# Patient Record
Sex: Male | Born: 1935 | Race: White | Hispanic: No | Marital: Married | Smoking: Former smoker
Health system: Southern US, Community
[De-identification: ages and names within clinical notes are randomized; demographics above are authoritative.]

## PROBLEM LIST (undated history)

## (undated) DIAGNOSIS — I639 Cerebral infarction, unspecified: Secondary | ICD-10-CM

## (undated) DIAGNOSIS — J349 Unspecified disorder of nose and nasal sinuses: Secondary | ICD-10-CM

## (undated) DIAGNOSIS — M1990 Unspecified osteoarthritis, unspecified site: Secondary | ICD-10-CM

## (undated) DIAGNOSIS — N320 Bladder-neck obstruction: Secondary | ICD-10-CM

## (undated) DIAGNOSIS — I358 Other nonrheumatic aortic valve disorders: Secondary | ICD-10-CM

## (undated) DIAGNOSIS — B029 Zoster without complications: Secondary | ICD-10-CM

## (undated) DIAGNOSIS — K219 Gastro-esophageal reflux disease without esophagitis: Secondary | ICD-10-CM

## (undated) DIAGNOSIS — G629 Polyneuropathy, unspecified: Secondary | ICD-10-CM

## (undated) DIAGNOSIS — D649 Anemia, unspecified: Secondary | ICD-10-CM

## (undated) DIAGNOSIS — R351 Nocturia: Secondary | ICD-10-CM

## (undated) DIAGNOSIS — R7303 Prediabetes: Secondary | ICD-10-CM

## (undated) DIAGNOSIS — E785 Hyperlipidemia, unspecified: Secondary | ICD-10-CM

## (undated) DIAGNOSIS — I10 Essential (primary) hypertension: Secondary | ICD-10-CM

## (undated) DIAGNOSIS — N529 Male erectile dysfunction, unspecified: Secondary | ICD-10-CM

## (undated) DIAGNOSIS — R32 Unspecified urinary incontinence: Secondary | ICD-10-CM

## (undated) DIAGNOSIS — M810 Age-related osteoporosis without current pathological fracture: Secondary | ICD-10-CM

## (undated) DIAGNOSIS — Z87442 Personal history of urinary calculi: Secondary | ICD-10-CM

## (undated) DIAGNOSIS — C61 Malignant neoplasm of prostate: Secondary | ICD-10-CM

## (undated) DIAGNOSIS — I340 Nonrheumatic mitral (valve) insufficiency: Secondary | ICD-10-CM

## (undated) DIAGNOSIS — K259 Gastric ulcer, unspecified as acute or chronic, without hemorrhage or perforation: Secondary | ICD-10-CM

## (undated) DIAGNOSIS — H9190 Unspecified hearing loss, unspecified ear: Secondary | ICD-10-CM

## (undated) DIAGNOSIS — R011 Cardiac murmur, unspecified: Secondary | ICD-10-CM

## (undated) DIAGNOSIS — R350 Frequency of micturition: Secondary | ICD-10-CM

## (undated) DIAGNOSIS — R339 Retention of urine, unspecified: Secondary | ICD-10-CM

## (undated) HISTORY — DX: Frequency of micturition: R35.0

## (undated) HISTORY — PX: CATARACT EXTRACTION: SUR2

## (undated) HISTORY — DX: Cerebral infarction, unspecified: I63.9

## (undated) HISTORY — PX: PROSTATE SURGERY: SHX751

## (undated) HISTORY — DX: Personal history of urinary calculi: Z87.442

## (undated) HISTORY — DX: Age-related osteoporosis without current pathological fracture: M81.0

## (undated) HISTORY — DX: Essential (primary) hypertension: I10

## (undated) HISTORY — DX: Gastro-esophageal reflux disease without esophagitis: K21.9

## (undated) HISTORY — DX: Hyperlipidemia, unspecified: E78.5

## (undated) HISTORY — DX: Unspecified disorder of nose and nasal sinuses: J34.9

## (undated) HISTORY — DX: Unspecified urinary incontinence: R32

## (undated) HISTORY — DX: Male erectile dysfunction, unspecified: N52.9

## (undated) HISTORY — DX: Zoster without complications: B02.9

## (undated) HISTORY — DX: Bladder-neck obstruction: N32.0

## (undated) HISTORY — DX: Gastric ulcer, unspecified as acute or chronic, without hemorrhage or perforation: K25.9

## (undated) HISTORY — DX: Retention of urine, unspecified: R33.9

## (undated) HISTORY — PX: TONSILLECTOMY: SUR1361

## (undated) HISTORY — DX: Malignant neoplasm of prostate: C61

## (undated) HISTORY — DX: Unspecified osteoarthritis, unspecified site: M19.90

## (undated) HISTORY — PX: UMBILICAL HERNIA REPAIR: SHX196

## (undated) HISTORY — DX: Nocturia: R35.1

## (undated) HISTORY — DX: Unspecified hearing loss, unspecified ear: H91.90

---

## 1970-07-08 HISTORY — PX: CHOLECYSTECTOMY: SHX55

## 2005-01-14 ENCOUNTER — Ambulatory Visit: Payer: Self-pay | Admitting: Unknown Physician Specialty

## 2007-06-11 ENCOUNTER — Ambulatory Visit: Payer: Self-pay | Admitting: Urology

## 2007-06-12 ENCOUNTER — Ambulatory Visit: Payer: Self-pay | Admitting: Urology

## 2007-06-19 ENCOUNTER — Ambulatory Visit: Payer: Self-pay | Admitting: Urology

## 2007-07-21 ENCOUNTER — Ambulatory Visit: Payer: Self-pay | Admitting: Urology

## 2007-07-21 ENCOUNTER — Other Ambulatory Visit: Payer: Self-pay

## 2007-07-28 ENCOUNTER — Ambulatory Visit: Payer: Self-pay | Admitting: Urology

## 2010-07-08 DIAGNOSIS — I639 Cerebral infarction, unspecified: Secondary | ICD-10-CM

## 2010-07-08 HISTORY — DX: Cerebral infarction, unspecified: I63.9

## 2010-11-19 ENCOUNTER — Inpatient Hospital Stay: Payer: Self-pay | Admitting: *Deleted

## 2010-11-23 DIAGNOSIS — I6990 Unspecified sequelae of unspecified cerebrovascular disease: Secondary | ICD-10-CM

## 2010-11-23 DIAGNOSIS — I6789 Other cerebrovascular disease: Secondary | ICD-10-CM

## 2010-11-23 DIAGNOSIS — I10 Essential (primary) hypertension: Secondary | ICD-10-CM

## 2010-11-26 ENCOUNTER — Telehealth: Payer: Self-pay | Admitting: *Deleted

## 2010-11-26 NOTE — Telephone Encounter (Signed)
Note is on my desk.  

## 2010-11-26 NOTE — Telephone Encounter (Signed)
Wife says that patient recently had a stoke, and they were supposed to fly out today to visit his brother. Wife is asking if you can write a note stating that due to to paint's illness they are not going to be able to fly out so that she can get reimbursed for her tickets. Please call when ready.

## 2010-11-26 NOTE — Telephone Encounter (Signed)
Patients daughter called requesting that letter be faxed to her at [phone].  Letter faxed per her request.

## 2010-11-26 NOTE — Telephone Encounter (Signed)
Left message on machine at home, letter left at front desk for pick up.

## 2010-11-27 ENCOUNTER — Telehealth: Payer: Self-pay | Admitting: *Deleted

## 2010-11-27 NOTE — Telephone Encounter (Signed)
You wrote a letter for the patient to be reimbursed for airline tickets, he has had a stroke and is unable to fly.  Pt's daughter is asking that you write another letter stating that in your professional opinion patient cannot fly.  She is also asking that we fax a copy of neurologist's report to her at fax number [phone].

## 2010-11-27 NOTE — Telephone Encounter (Signed)
Spoke with Darl Pikes and advised her as instructed.  She will call Dondra Spry to get neurology notes.  Re-written note faxed to Darl Pikes at [phone].

## 2010-11-27 NOTE — Telephone Encounter (Signed)
Letter written

## 2010-11-27 NOTE — Telephone Encounter (Signed)
In terms of neurology report, that would be in his records at [facility]. Please call [facility] and ask to speak to his nurse, Dondra Spry. She can fax [facility] notes to the number below.

## 2010-12-18 DIAGNOSIS — I10 Essential (primary) hypertension: Secondary | ICD-10-CM

## 2010-12-18 DIAGNOSIS — I6990 Unspecified sequelae of unspecified cerebrovascular disease: Secondary | ICD-10-CM

## 2010-12-18 DIAGNOSIS — E785 Hyperlipidemia, unspecified: Secondary | ICD-10-CM

## 2011-01-15 DIAGNOSIS — I6990 Unspecified sequelae of unspecified cerebrovascular disease: Secondary | ICD-10-CM

## 2011-01-15 DIAGNOSIS — E785 Hyperlipidemia, unspecified: Secondary | ICD-10-CM

## 2011-01-15 DIAGNOSIS — I10 Essential (primary) hypertension: Secondary | ICD-10-CM

## 2011-01-28 ENCOUNTER — Ambulatory Visit: Payer: Self-pay | Admitting: Family Medicine

## 2011-02-17 ENCOUNTER — Other Ambulatory Visit: Payer: Self-pay | Admitting: Internal Medicine

## 2011-02-18 NOTE — Telephone Encounter (Signed)
I don't think he has established care here.  I saw him at [facility].  His current PCP needs to refill these medications if he is no longer at [facility].

## 2011-09-23 DIAGNOSIS — R7309 Other abnormal glucose: Secondary | ICD-10-CM | POA: Diagnosis not present

## 2011-09-23 DIAGNOSIS — I10 Essential (primary) hypertension: Secondary | ICD-10-CM | POA: Diagnosis not present

## 2011-09-23 DIAGNOSIS — E7800 Pure hypercholesterolemia, unspecified: Secondary | ICD-10-CM | POA: Diagnosis not present

## 2011-09-25 DIAGNOSIS — R209 Unspecified disturbances of skin sensation: Secondary | ICD-10-CM | POA: Diagnosis not present

## 2011-09-25 DIAGNOSIS — R7309 Other abnormal glucose: Secondary | ICD-10-CM | POA: Diagnosis not present

## 2011-09-25 DIAGNOSIS — R5381 Other malaise: Secondary | ICD-10-CM | POA: Diagnosis not present

## 2011-09-25 DIAGNOSIS — R252 Cramp and spasm: Secondary | ICD-10-CM | POA: Diagnosis not present

## 2011-09-25 DIAGNOSIS — E7800 Pure hypercholesterolemia, unspecified: Secondary | ICD-10-CM | POA: Diagnosis not present

## 2011-10-03 ENCOUNTER — Other Ambulatory Visit: Payer: Medicare Other

## 2011-10-10 ENCOUNTER — Ambulatory Visit: Payer: Medicare Other | Admitting: Internal Medicine

## 2011-10-21 DIAGNOSIS — E7800 Pure hypercholesterolemia, unspecified: Secondary | ICD-10-CM | POA: Diagnosis not present

## 2011-10-21 DIAGNOSIS — B351 Tinea unguium: Secondary | ICD-10-CM | POA: Diagnosis not present

## 2011-10-21 DIAGNOSIS — I10 Essential (primary) hypertension: Secondary | ICD-10-CM | POA: Diagnosis not present

## 2011-10-21 DIAGNOSIS — R5381 Other malaise: Secondary | ICD-10-CM | POA: Diagnosis not present

## 2011-10-28 ENCOUNTER — Other Ambulatory Visit: Payer: Medicare Other

## 2011-10-28 DIAGNOSIS — B351 Tinea unguium: Secondary | ICD-10-CM | POA: Diagnosis not present

## 2011-10-28 DIAGNOSIS — M79609 Pain in unspecified limb: Secondary | ICD-10-CM | POA: Diagnosis not present

## 2011-10-28 DIAGNOSIS — Q828 Other specified congenital malformations of skin: Secondary | ICD-10-CM | POA: Diagnosis not present

## 2011-10-28 DIAGNOSIS — M2040 Other hammer toe(s) (acquired), unspecified foot: Secondary | ICD-10-CM | POA: Diagnosis not present

## 2011-11-28 DIAGNOSIS — B351 Tinea unguium: Secondary | ICD-10-CM | POA: Diagnosis not present

## 2011-11-28 DIAGNOSIS — E7800 Pure hypercholesterolemia, unspecified: Secondary | ICD-10-CM | POA: Diagnosis not present

## 2011-11-28 DIAGNOSIS — R5381 Other malaise: Secondary | ICD-10-CM | POA: Diagnosis not present

## 2011-11-28 DIAGNOSIS — J069 Acute upper respiratory infection, unspecified: Secondary | ICD-10-CM | POA: Diagnosis not present

## 2011-11-28 DIAGNOSIS — R5383 Other fatigue: Secondary | ICD-10-CM | POA: Diagnosis not present

## 2012-01-01 DIAGNOSIS — N139 Obstructive and reflux uropathy, unspecified: Secondary | ICD-10-CM | POA: Diagnosis not present

## 2012-01-01 DIAGNOSIS — E7800 Pure hypercholesterolemia, unspecified: Secondary | ICD-10-CM | POA: Diagnosis not present

## 2012-01-01 DIAGNOSIS — B351 Tinea unguium: Secondary | ICD-10-CM | POA: Diagnosis not present

## 2012-01-01 DIAGNOSIS — J309 Allergic rhinitis, unspecified: Secondary | ICD-10-CM | POA: Diagnosis not present

## 2012-01-01 DIAGNOSIS — I10 Essential (primary) hypertension: Secondary | ICD-10-CM | POA: Diagnosis not present

## 2012-01-01 DIAGNOSIS — N529 Male erectile dysfunction, unspecified: Secondary | ICD-10-CM | POA: Diagnosis not present

## 2012-01-01 DIAGNOSIS — R339 Retention of urine, unspecified: Secondary | ICD-10-CM | POA: Diagnosis not present

## 2012-01-01 DIAGNOSIS — C61 Malignant neoplasm of prostate: Secondary | ICD-10-CM | POA: Diagnosis not present

## 2012-01-27 DIAGNOSIS — M79609 Pain in unspecified limb: Secondary | ICD-10-CM | POA: Diagnosis not present

## 2012-01-27 DIAGNOSIS — B351 Tinea unguium: Secondary | ICD-10-CM | POA: Diagnosis not present

## 2012-02-24 DIAGNOSIS — R609 Edema, unspecified: Secondary | ICD-10-CM | POA: Diagnosis not present

## 2012-02-24 DIAGNOSIS — B351 Tinea unguium: Secondary | ICD-10-CM | POA: Diagnosis not present

## 2012-02-24 DIAGNOSIS — I10 Essential (primary) hypertension: Secondary | ICD-10-CM | POA: Diagnosis not present

## 2012-02-24 DIAGNOSIS — E7800 Pure hypercholesterolemia, unspecified: Secondary | ICD-10-CM | POA: Diagnosis not present

## 2012-04-25 ENCOUNTER — Emergency Department: Payer: Self-pay | Admitting: Emergency Medicine

## 2012-04-25 DIAGNOSIS — B029 Zoster without complications: Secondary | ICD-10-CM | POA: Diagnosis not present

## 2012-04-25 DIAGNOSIS — R079 Chest pain, unspecified: Secondary | ICD-10-CM | POA: Diagnosis not present

## 2012-04-25 DIAGNOSIS — R0602 Shortness of breath: Secondary | ICD-10-CM | POA: Diagnosis not present

## 2012-04-25 DIAGNOSIS — Z8546 Personal history of malignant neoplasm of prostate: Secondary | ICD-10-CM | POA: Diagnosis not present

## 2012-04-25 LAB — BASIC METABOLIC PANEL
Anion Gap: 8 (ref 7–16)
Calcium, Total: 9.4 mg/dL (ref 8.5–10.1)
Co2: 27 mmol/L (ref 21–32)
Creatinine: 1 mg/dL (ref 0.60–1.30)
EGFR (African American): >60
EGFR (Non-African Amer.): >60
Osmolality: 288 (ref 275–301)
Potassium: 4.2 mmol/L (ref 3.5–5.1)
Sodium: 142 mmol/L (ref 136–145)

## 2012-04-25 LAB — CBC
HGB: 14.6 g/dL (ref 13.0–18.0)
MCH: 30.9 pg (ref 26.0–34.0)
MCV: 92 fL (ref 80–100)
RBC: 4.72 10*6/uL (ref 4.40–5.90)

## 2012-04-25 LAB — TROPONIN I: Troponin-I: <0.02 ng/mL

## 2012-04-25 LAB — CK TOTAL AND CKMB (NOT AT ARMC)
CK, Total: 105 U/L (ref 35–232)
CK-MB: 2.6 ng/mL (ref 0.5–3.6)

## 2012-05-12 DIAGNOSIS — B353 Tinea pedis: Secondary | ICD-10-CM | POA: Diagnosis not present

## 2012-05-12 DIAGNOSIS — M79609 Pain in unspecified limb: Secondary | ICD-10-CM | POA: Diagnosis not present

## 2012-05-12 DIAGNOSIS — B351 Tinea unguium: Secondary | ICD-10-CM | POA: Diagnosis not present

## 2012-05-25 DIAGNOSIS — I10 Essential (primary) hypertension: Secondary | ICD-10-CM | POA: Diagnosis not present

## 2012-05-25 DIAGNOSIS — R7309 Other abnormal glucose: Secondary | ICD-10-CM | POA: Diagnosis not present

## 2012-05-25 DIAGNOSIS — B351 Tinea unguium: Secondary | ICD-10-CM | POA: Diagnosis not present

## 2012-05-25 DIAGNOSIS — B029 Zoster without complications: Secondary | ICD-10-CM | POA: Diagnosis not present

## 2012-06-09 DIAGNOSIS — Z23 Encounter for immunization: Secondary | ICD-10-CM | POA: Diagnosis not present

## 2012-06-09 DIAGNOSIS — B029 Zoster without complications: Secondary | ICD-10-CM | POA: Diagnosis not present

## 2012-06-09 DIAGNOSIS — B351 Tinea unguium: Secondary | ICD-10-CM | POA: Diagnosis not present

## 2012-07-08 DIAGNOSIS — B029 Zoster without complications: Secondary | ICD-10-CM

## 2012-07-08 HISTORY — DX: Zoster without complications: B02.9

## 2012-08-17 DIAGNOSIS — B351 Tinea unguium: Secondary | ICD-10-CM | POA: Diagnosis not present

## 2012-08-17 DIAGNOSIS — M79609 Pain in unspecified limb: Secondary | ICD-10-CM | POA: Diagnosis not present

## 2012-08-27 DIAGNOSIS — H903 Sensorineural hearing loss, bilateral: Secondary | ICD-10-CM | POA: Diagnosis not present

## 2012-08-27 DIAGNOSIS — H6120 Impacted cerumen, unspecified ear: Secondary | ICD-10-CM | POA: Diagnosis not present

## 2012-09-21 DIAGNOSIS — R609 Edema, unspecified: Secondary | ICD-10-CM | POA: Diagnosis not present

## 2012-09-21 DIAGNOSIS — I10 Essential (primary) hypertension: Secondary | ICD-10-CM | POA: Diagnosis not present

## 2012-09-21 DIAGNOSIS — E7800 Pure hypercholesterolemia, unspecified: Secondary | ICD-10-CM | POA: Diagnosis not present

## 2012-09-21 DIAGNOSIS — B351 Tinea unguium: Secondary | ICD-10-CM | POA: Diagnosis not present

## 2012-09-29 DIAGNOSIS — R7309 Other abnormal glucose: Secondary | ICD-10-CM | POA: Diagnosis not present

## 2012-09-29 DIAGNOSIS — N529 Male erectile dysfunction, unspecified: Secondary | ICD-10-CM | POA: Insufficient documentation

## 2012-09-29 DIAGNOSIS — B356 Tinea cruris: Secondary | ICD-10-CM | POA: Insufficient documentation

## 2012-09-29 DIAGNOSIS — E7800 Pure hypercholesterolemia, unspecified: Secondary | ICD-10-CM | POA: Diagnosis not present

## 2012-09-29 DIAGNOSIS — R339 Retention of urine, unspecified: Secondary | ICD-10-CM | POA: Insufficient documentation

## 2012-09-29 DIAGNOSIS — R609 Edema, unspecified: Secondary | ICD-10-CM | POA: Diagnosis not present

## 2012-09-29 DIAGNOSIS — I10 Essential (primary) hypertension: Secondary | ICD-10-CM | POA: Diagnosis not present

## 2012-11-04 DIAGNOSIS — R350 Frequency of micturition: Secondary | ICD-10-CM | POA: Diagnosis not present

## 2012-11-04 DIAGNOSIS — B351 Tinea unguium: Secondary | ICD-10-CM | POA: Diagnosis not present

## 2012-11-04 DIAGNOSIS — B029 Zoster without complications: Secondary | ICD-10-CM | POA: Diagnosis not present

## 2012-11-04 DIAGNOSIS — N3090 Cystitis, unspecified without hematuria: Secondary | ICD-10-CM | POA: Diagnosis not present

## 2012-11-09 DIAGNOSIS — R350 Frequency of micturition: Secondary | ICD-10-CM | POA: Diagnosis not present

## 2012-11-09 DIAGNOSIS — N139 Obstructive and reflux uropathy, unspecified: Secondary | ICD-10-CM | POA: Diagnosis not present

## 2012-11-09 DIAGNOSIS — N400 Enlarged prostate without lower urinary tract symptoms: Secondary | ICD-10-CM | POA: Diagnosis not present

## 2012-11-09 DIAGNOSIS — C61 Malignant neoplasm of prostate: Secondary | ICD-10-CM | POA: Diagnosis not present

## 2012-11-09 DIAGNOSIS — N529 Male erectile dysfunction, unspecified: Secondary | ICD-10-CM | POA: Diagnosis not present

## 2012-11-09 DIAGNOSIS — N3941 Urge incontinence: Secondary | ICD-10-CM | POA: Insufficient documentation

## 2012-11-09 DIAGNOSIS — Z87891 Personal history of nicotine dependence: Secondary | ICD-10-CM | POA: Diagnosis not present

## 2012-11-09 DIAGNOSIS — R300 Dysuria: Secondary | ICD-10-CM | POA: Diagnosis not present

## 2012-11-09 DIAGNOSIS — I10 Essential (primary) hypertension: Secondary | ICD-10-CM | POA: Diagnosis not present

## 2012-11-09 DIAGNOSIS — Z79899 Other long term (current) drug therapy: Secondary | ICD-10-CM | POA: Diagnosis not present

## 2012-12-03 DIAGNOSIS — Z8546 Personal history of malignant neoplasm of prostate: Secondary | ICD-10-CM | POA: Insufficient documentation

## 2012-12-03 DIAGNOSIS — C61 Malignant neoplasm of prostate: Secondary | ICD-10-CM | POA: Diagnosis not present

## 2012-12-03 DIAGNOSIS — R300 Dysuria: Secondary | ICD-10-CM | POA: Diagnosis not present

## 2012-12-03 DIAGNOSIS — R350 Frequency of micturition: Secondary | ICD-10-CM | POA: Diagnosis not present

## 2012-12-07 DIAGNOSIS — M79609 Pain in unspecified limb: Secondary | ICD-10-CM | POA: Diagnosis not present

## 2012-12-07 DIAGNOSIS — B351 Tinea unguium: Secondary | ICD-10-CM | POA: Diagnosis not present

## 2012-12-28 DIAGNOSIS — R7309 Other abnormal glucose: Secondary | ICD-10-CM | POA: Diagnosis not present

## 2012-12-28 DIAGNOSIS — E7800 Pure hypercholesterolemia, unspecified: Secondary | ICD-10-CM | POA: Diagnosis not present

## 2012-12-28 DIAGNOSIS — R252 Cramp and spasm: Secondary | ICD-10-CM | POA: Diagnosis not present

## 2012-12-28 DIAGNOSIS — B351 Tinea unguium: Secondary | ICD-10-CM | POA: Diagnosis not present

## 2013-01-01 DIAGNOSIS — B351 Tinea unguium: Secondary | ICD-10-CM | POA: Diagnosis not present

## 2013-01-01 DIAGNOSIS — L723 Sebaceous cyst: Secondary | ICD-10-CM | POA: Diagnosis not present

## 2013-01-01 DIAGNOSIS — R7309 Other abnormal glucose: Secondary | ICD-10-CM | POA: Diagnosis not present

## 2013-01-01 DIAGNOSIS — L0390 Cellulitis, unspecified: Secondary | ICD-10-CM | POA: Diagnosis not present

## 2013-01-01 DIAGNOSIS — L0291 Cutaneous abscess, unspecified: Secondary | ICD-10-CM | POA: Diagnosis not present

## 2013-04-05 DIAGNOSIS — B351 Tinea unguium: Secondary | ICD-10-CM | POA: Diagnosis not present

## 2013-04-05 DIAGNOSIS — M79609 Pain in unspecified limb: Secondary | ICD-10-CM | POA: Diagnosis not present

## 2013-04-07 DIAGNOSIS — R7309 Other abnormal glucose: Secondary | ICD-10-CM | POA: Diagnosis not present

## 2013-04-07 DIAGNOSIS — Z23 Encounter for immunization: Secondary | ICD-10-CM | POA: Diagnosis not present

## 2013-04-07 DIAGNOSIS — B351 Tinea unguium: Secondary | ICD-10-CM | POA: Diagnosis not present

## 2013-04-07 DIAGNOSIS — I10 Essential (primary) hypertension: Secondary | ICD-10-CM | POA: Diagnosis not present

## 2013-04-07 DIAGNOSIS — E7800 Pure hypercholesterolemia, unspecified: Secondary | ICD-10-CM | POA: Diagnosis not present

## 2013-04-19 ENCOUNTER — Encounter: Payer: Self-pay | Admitting: *Deleted

## 2013-04-19 DIAGNOSIS — E7800 Pure hypercholesterolemia, unspecified: Secondary | ICD-10-CM | POA: Diagnosis not present

## 2013-04-19 DIAGNOSIS — L723 Sebaceous cyst: Secondary | ICD-10-CM | POA: Diagnosis not present

## 2013-04-19 DIAGNOSIS — Z23 Encounter for immunization: Secondary | ICD-10-CM | POA: Diagnosis not present

## 2013-04-19 DIAGNOSIS — R7309 Other abnormal glucose: Secondary | ICD-10-CM | POA: Diagnosis not present

## 2013-04-19 DIAGNOSIS — B351 Tinea unguium: Secondary | ICD-10-CM | POA: Diagnosis not present

## 2013-04-28 ENCOUNTER — Encounter: Payer: Self-pay | Admitting: General Surgery

## 2013-04-28 ENCOUNTER — Ambulatory Visit (INDEPENDENT_AMBULATORY_CARE_PROVIDER_SITE_OTHER): Payer: Medicare Other | Admitting: General Surgery

## 2013-04-28 VITALS — BP 130/68 | HR 72 | Resp 14 | Ht 69.0 in | Wt 184.0 lb

## 2013-04-28 DIAGNOSIS — L723 Sebaceous cyst: Secondary | ICD-10-CM

## 2013-04-28 NOTE — Progress Notes (Signed)
Patient ID: [name], male   DOB: June 15, 1925, 79 y.o.   MRN: [id number]  Chief Complaint  Patient presents with  . Cyst    left shoulder    HPI [name] is a 79 y.o. male.  Here today for evaluation of infected cyst left shoulder referred by Dr. Baron Hamper. States he has had the cyst for many years but since about 2 months ago it has been infected and having trouble.  He has completed the antibiotics that he was taking on Monday. States it has dried and not draining now. He would like for it to be removed. HPI  Past Medical History  Diagnosis Date  . Shingles 2014  . Stroke 2012  . Hard of hearing     Past Surgical History  Procedure Laterality Date  . Cholecystectomy  1972  . Cataract extraction  2000, 2001  . Prostate surgery  2008    Family History  Problem Relation Age of Onset  . Cancer Mother   . Stroke Father     Social History History  Substance Use Topics  . Smoking status: Former Smoker -- 10 years    Quit date: 07/08/1962  . Smokeless tobacco: Never Used  . Alcohol Use: Yes     Comment: beer occasionally    No Known Allergies  Current Outpatient Prescriptions  Medication Sig Dispense Refill  . amLODipine (NORVASC) 5 MG tablet Take 5 mg by mouth daily.       [name] aspirin 81 MG tablet Take 81 mg by mouth daily.      . fluticasone (FLONASE) 50 MCG/ACT nasal spray Place 2 sprays into the nose daily.       . hydrochlorothiazide (MICROZIDE) 12.5 MG capsule Take 12.5 mg by mouth every morning.       [name] losartan (COZAAR) 25 MG tablet Take 25 mg by mouth daily.       . Multiple Vitamin (MULTIVITAMIN) capsule Take 1 capsule by mouth daily.      . pravastatin (PRAVACHOL) 20 MG tablet Take 20 mg by mouth daily.        No current facility-administered medications for this visit.    Review of Systems Review of Systems  Constitutional: Negative.   Respiratory: Negative.   Cardiovascular: Negative.     Blood pressure 130/68, pulse 72, resp. rate 14,  height 5\' 9"  (1.753 m), weight 184 lb (83.462 kg).  Physical Exam Physical Exam  Constitutional: He is oriented to person, place, and time. He appears well-developed and well-nourished.  Cardiovascular: Normal rate, regular rhythm and normal heart sounds.   Pulmonary/Chest: Effort normal and breath sounds normal.  Lymphadenopathy:    He has no axillary adenopathy.  Neurological: He is alert and oriented to person, place, and time.  Skin: Skin is warm and dry.  Thickening on left posterior shoulder, tiny amount of drainage noted.    Data Reviewed ECP notes dated 04/19/2013. Patient treated with tobramycin 100 mg twice a day x7 days.  Assessment    Recurrent sebaceous cyst with residual skin thickening.    Plan    Considering he's had 2 episodes of inflammation in the last few months excision of the remaining cores reasonable. This will be completed at a convenient time in the near future.       Earline Mayotte 04/30/2013, 5:46 PM

## 2013-04-28 NOTE — Patient Instructions (Signed)
The patient is aware to call back for any questions or concerns.  

## 2013-04-30 DIAGNOSIS — L723 Sebaceous cyst: Secondary | ICD-10-CM | POA: Insufficient documentation

## 2013-05-10 ENCOUNTER — Ambulatory Visit (INDEPENDENT_AMBULATORY_CARE_PROVIDER_SITE_OTHER): Payer: Medicare Other | Admitting: General Surgery

## 2013-05-10 ENCOUNTER — Encounter: Payer: Self-pay | Admitting: General Surgery

## 2013-05-10 VITALS — BP 132/72 | HR 76 | Resp 14 | Ht 69.0 in | Wt 182.0 lb

## 2013-05-10 DIAGNOSIS — L723 Sebaceous cyst: Secondary | ICD-10-CM | POA: Diagnosis not present

## 2013-05-10 DIAGNOSIS — D2360 Other benign neoplasm of skin of unspecified upper limb, including shoulder: Secondary | ICD-10-CM | POA: Diagnosis not present

## 2013-05-10 NOTE — Progress Notes (Signed)
Patient ID: [name], male   DOB: Aug 14, 1924, 79 y.o.   MRN: [id number]  Chief Complaint  Patient presents with  . Routine Post Op    excision    HPI [name] is a 79 y.o. male. The patient presents for planned excision of the left posterior shoulder abscess site.  HPI  Past Medical History  Diagnosis Date  . Shingles 2014  . Stroke 2012  . Hard of hearing     Past Surgical History  Procedure Laterality Date  . Cholecystectomy  1972  . Cataract extraction  2000, 2001  . Prostate surgery  2008    Family History  Problem Relation Age of Onset  . Cancer Mother   . Stroke Father     Social History History  Substance Use Topics  . Smoking status: Former Smoker -- 10 years    Quit date: 07/08/1962  . Smokeless tobacco: Never Used  . Alcohol Use: Yes     Comment: beer occasionally    No Known Allergies  Current Outpatient Prescriptions  Medication Sig Dispense Refill  . amLODipine (NORVASC) 5 MG tablet Take 5 mg by mouth daily.       [name] aspirin 81 MG tablet Take 81 mg by mouth daily.      . fluticasone (FLONASE) 50 MCG/ACT nasal spray Place 2 sprays into the nose daily.       . hydrochlorothiazide (MICROZIDE) 12.5 MG capsule Take 12.5 mg by mouth every morning.       [name] losartan (COZAAR) 25 MG tablet Take 25 mg by mouth daily.       . Multiple Vitamin (MULTIVITAMIN) capsule Take 1 capsule by mouth daily.      . pravastatin (PRAVACHOL) 20 MG tablet Take 20 mg by mouth daily.        No current facility-administered medications for this visit.    Review of Systems Review of Systems  Blood pressure 132/72, pulse 76, resp. rate 14, height 5\' 9"  (1.753 m), weight 182 lb (82.555 kg).  Physical Exam Physical Exam Healing scar at the site of previous incision and drainage. Less than 2 cm in diameter.  Data Reviewed None.  Assessment    Recurrent soft-tissue abscess.     Plan    The area was cleaned with alcohol and a total of 10 cc of 0.5%  Xylocaine was report 25% Marcaine with 1-200,000 epinephrine was utilized a well-tolerated. This was supplemented with 3 cc 1% plain Xylocaine. Chlor prep was applied to the skin. The area was excised elliptical incision. Deep tissue and hemostasis was achieved with 3-0 Vicryl ties. The skin was closed with a running 4-0 nylon simple suture. Telfa and Tegaderm dressing applied. The procedure was well-tolerated. The patient will make use of Tylenol for comfort. Suture removal the nurse/CMA in 10 days.        Earline Mayotte 05/10/2013, 9:59 PM

## 2013-05-10 NOTE — Patient Instructions (Signed)
Patient to return in 10 days for suture removal.

## 2013-05-12 LAB — PATHOLOGY

## 2013-05-20 ENCOUNTER — Ambulatory Visit (INDEPENDENT_AMBULATORY_CARE_PROVIDER_SITE_OTHER): Payer: Medicare Other | Admitting: *Deleted

## 2013-05-20 DIAGNOSIS — L723 Sebaceous cyst: Secondary | ICD-10-CM

## 2013-05-20 NOTE — Patient Instructions (Signed)
The patient is aware to call back for any questions or concerns.  

## 2013-05-20 NOTE — Progress Notes (Signed)
Patient came in today for a wound check and suture removal from the left shoulder. The wound is clean, with no signs of infection noted. Aware of pathology. Steri strips applied.Follow up as scheduled.

## 2013-05-27 DIAGNOSIS — H43819 Vitreous degeneration, unspecified eye: Secondary | ICD-10-CM | POA: Diagnosis not present

## 2013-07-07 ENCOUNTER — Encounter: Payer: Self-pay | Admitting: Podiatry

## 2013-07-12 ENCOUNTER — Ambulatory Visit: Payer: Self-pay | Admitting: Podiatry

## 2013-08-30 ENCOUNTER — Ambulatory Visit: Payer: Medicare Other | Admitting: Podiatry

## 2013-08-30 VITALS — BP 148/75 | HR 73 | Resp 16 | Ht 69.0 in | Wt 180.0 lb

## 2013-08-30 DIAGNOSIS — M79609 Pain in unspecified limb: Secondary | ICD-10-CM

## 2013-08-30 DIAGNOSIS — B351 Tinea unguium: Secondary | ICD-10-CM

## 2013-08-30 DIAGNOSIS — Q828 Other specified congenital malformations of skin: Secondary | ICD-10-CM

## 2013-08-30 MED ORDER — TAVABOROLE 5 % EX SOLN: CUTANEOUS | Status: DC

## 2013-08-30 NOTE — Progress Notes (Signed)
[name] presents today with a chief complaint of painfully elongated toenails.  Objective: Vital signs are stable he is alert and oriented x3 pulses are strongly palpable bilateral. His nails are thick yellow dystrophic clinically mycotic and painful palpation.  Assessment: Pain in limb secondary to onychomycosis 1 through 5 bilateral.  Plan: Debridement of nails 1 through 5 bilateral is a covered service secondary to pain I also started him on [place]. I will followup with him in 3 months.

## 2013-09-20 DIAGNOSIS — B351 Tinea unguium: Secondary | ICD-10-CM | POA: Diagnosis not present

## 2013-09-20 DIAGNOSIS — Z23 Encounter for immunization: Secondary | ICD-10-CM | POA: Diagnosis not present

## 2013-09-20 DIAGNOSIS — S93429A Sprain of deltoid ligament of unspecified ankle, initial encounter: Secondary | ICD-10-CM | POA: Diagnosis not present

## 2013-09-20 DIAGNOSIS — E7800 Pure hypercholesterolemia, unspecified: Secondary | ICD-10-CM | POA: Diagnosis not present

## 2013-09-20 DIAGNOSIS — R7309 Other abnormal glucose: Secondary | ICD-10-CM | POA: Diagnosis not present

## 2013-09-30 ENCOUNTER — Telehealth: Payer: Self-pay | Admitting: *Deleted

## 2013-09-30 NOTE — Telephone Encounter (Signed)
Pt called said he was spooked by using the kerydin with his other medications and didn't know if dr Milinda Pointer knew what rx he was taking. i told pt he would be fine to use the kerydin and he was to use it on top and under his nail(s) and if he got it on his skin to wipe it off. Pt understood.

## 2013-10-11 DIAGNOSIS — R7309 Other abnormal glucose: Secondary | ICD-10-CM | POA: Diagnosis not present

## 2013-10-11 DIAGNOSIS — E7800 Pure hypercholesterolemia, unspecified: Secondary | ICD-10-CM | POA: Diagnosis not present

## 2013-10-11 DIAGNOSIS — I10 Essential (primary) hypertension: Secondary | ICD-10-CM | POA: Diagnosis not present

## 2013-10-11 DIAGNOSIS — Z1330 Encounter for screening examination for mental health and behavioral disorders, unspecified: Secondary | ICD-10-CM | POA: Diagnosis not present

## 2013-10-11 DIAGNOSIS — Z1331 Encounter for screening for depression: Secondary | ICD-10-CM | POA: Diagnosis not present

## 2013-10-12 DIAGNOSIS — I10 Essential (primary) hypertension: Secondary | ICD-10-CM | POA: Diagnosis not present

## 2013-10-12 DIAGNOSIS — E7800 Pure hypercholesterolemia, unspecified: Secondary | ICD-10-CM | POA: Diagnosis not present

## 2013-10-12 DIAGNOSIS — R7309 Other abnormal glucose: Secondary | ICD-10-CM | POA: Diagnosis not present

## 2013-11-15 ENCOUNTER — Ambulatory Visit (INDEPENDENT_AMBULATORY_CARE_PROVIDER_SITE_OTHER): Payer: Medicare Other | Admitting: Podiatry

## 2013-11-15 ENCOUNTER — Encounter: Payer: Self-pay | Admitting: Podiatry

## 2013-11-15 VITALS — BP 116/86 | HR 88 | Resp 20

## 2013-11-15 DIAGNOSIS — M79609 Pain in unspecified limb: Secondary | ICD-10-CM

## 2013-11-15 DIAGNOSIS — Q828 Other specified congenital malformations of skin: Secondary | ICD-10-CM | POA: Diagnosis not present

## 2013-11-15 DIAGNOSIS — B351 Tinea unguium: Secondary | ICD-10-CM | POA: Diagnosis not present

## 2013-11-15 NOTE — Progress Notes (Signed)
He presents today with a chief complaint of painful elongated toenails and a porokeratotic lesion plantar aspect the bilateral foot.  Objective: Pulses are strongly palpable bilateral. Nails are thick yellow dystrophic with mycotic and painful palpation porokeratotic lesion plantar aspect of the foot noted. Without complications.  Assessment: Pain in limb secondary to onychomycosis and porokeratotic.  Plan: Debridement of nails 1 through 5 bilateral and debridement of reactive hyperkeratosis.

## 2013-12-30 DIAGNOSIS — R350 Frequency of micturition: Secondary | ICD-10-CM | POA: Diagnosis not present

## 2013-12-30 DIAGNOSIS — C61 Malignant neoplasm of prostate: Secondary | ICD-10-CM | POA: Diagnosis not present

## 2014-01-12 DIAGNOSIS — M1990 Unspecified osteoarthritis, unspecified site: Secondary | ICD-10-CM | POA: Insufficient documentation

## 2014-01-12 DIAGNOSIS — M1710 Unilateral primary osteoarthritis, unspecified knee: Secondary | ICD-10-CM | POA: Diagnosis not present

## 2014-01-12 DIAGNOSIS — M25569 Pain in unspecified knee: Secondary | ICD-10-CM | POA: Diagnosis not present

## 2014-02-09 DIAGNOSIS — H04129 Dry eye syndrome of unspecified lacrimal gland: Secondary | ICD-10-CM | POA: Diagnosis not present

## 2014-02-14 ENCOUNTER — Ambulatory Visit (INDEPENDENT_AMBULATORY_CARE_PROVIDER_SITE_OTHER): Payer: Medicare Other | Admitting: Podiatry

## 2014-02-14 DIAGNOSIS — M79676 Pain in unspecified toe(s): Secondary | ICD-10-CM

## 2014-02-14 DIAGNOSIS — B351 Tinea unguium: Secondary | ICD-10-CM | POA: Diagnosis not present

## 2014-02-14 DIAGNOSIS — M79609 Pain in unspecified limb: Secondary | ICD-10-CM | POA: Diagnosis not present

## 2014-02-14 DIAGNOSIS — Q828 Other specified congenital malformations of skin: Secondary | ICD-10-CM

## 2014-02-14 NOTE — Progress Notes (Signed)
He presents today with a chief complaint of painful elongated toenails one through 5 bilateral.  Objective: Pulses are palpable bilateral. Reactive hyperkeratosis present bilateral. Nails are thick yellow dystrophic onychomycotic and painful palpation.  Assessment: Pain in limb secondary to onychomycosis 1 through 5 bilateral. Debridement of all reactive hyperkeratosis bilateral.

## 2014-04-11 DIAGNOSIS — H5501 Congenital nystagmus: Secondary | ICD-10-CM | POA: Diagnosis not present

## 2014-04-13 DIAGNOSIS — Z23 Encounter for immunization: Secondary | ICD-10-CM | POA: Diagnosis not present

## 2014-05-16 ENCOUNTER — Ambulatory Visit: Payer: Federal, State, Local not specified - PPO | Admitting: Podiatry

## 2014-05-16 ENCOUNTER — Ambulatory Visit (INDEPENDENT_AMBULATORY_CARE_PROVIDER_SITE_OTHER): Payer: Medicare Other | Admitting: Podiatry

## 2014-05-16 DIAGNOSIS — M79676 Pain in unspecified toe(s): Secondary | ICD-10-CM

## 2014-05-16 DIAGNOSIS — B351 Tinea unguium: Secondary | ICD-10-CM

## 2014-05-16 MED ORDER — TAVABOROLE 5 % EX SOLN
CUTANEOUS | Status: DC
Start: 2014-05-16 — End: 2015-01-05

## 2014-05-16 NOTE — Progress Notes (Signed)
He presents today with chief complaint of painful elongated toenails.  Objective: Pulses are palpable bilateral. Nails are thick yellow dystrophic onychomycotic and painful palpation.  Assessment: Pain in limb secondary to onychomycosis 1 through 5 bilateral.  Plan: Debridement of nails 1 through 5 bilateral secondary to pain. Follow up with him in 3 months.

## 2014-06-08 DIAGNOSIS — I10 Essential (primary) hypertension: Secondary | ICD-10-CM | POA: Diagnosis not present

## 2014-06-08 DIAGNOSIS — R7309 Other abnormal glucose: Secondary | ICD-10-CM | POA: Diagnosis not present

## 2014-06-08 DIAGNOSIS — E780 Pure hypercholesterolemia: Secondary | ICD-10-CM | POA: Diagnosis not present

## 2014-07-15 DIAGNOSIS — J019 Acute sinusitis, unspecified: Secondary | ICD-10-CM | POA: Diagnosis not present

## 2014-07-15 DIAGNOSIS — E780 Pure hypercholesterolemia: Secondary | ICD-10-CM | POA: Diagnosis not present

## 2014-07-15 DIAGNOSIS — I10 Essential (primary) hypertension: Secondary | ICD-10-CM | POA: Diagnosis not present

## 2014-08-22 ENCOUNTER — Ambulatory Visit: Payer: Medicare Other

## 2014-08-22 ENCOUNTER — Ambulatory Visit: Payer: Federal, State, Local not specified - PPO | Admitting: Podiatry

## 2014-08-30 ENCOUNTER — Telehealth: Payer: Self-pay | Admitting: Podiatry

## 2014-08-30 NOTE — Telephone Encounter (Signed)
Pt would like a refill on cream 1 percent called into [facility] [address] [facility]

## 2014-09-01 ENCOUNTER — Other Ambulatory Visit: Payer: Self-pay | Admitting: *Deleted

## 2014-09-01 MED ORDER — ECONAZOLE NITRATE 1 % EX CREA: TOPICAL_CREAM | Freq: Two times a day (BID) | CUTANEOUS | Status: DC

## 2014-09-05 ENCOUNTER — Ambulatory Visit: Payer: Medicare Other

## 2014-09-07 DIAGNOSIS — R7309 Other abnormal glucose: Secondary | ICD-10-CM | POA: Diagnosis not present

## 2014-09-07 DIAGNOSIS — I10 Essential (primary) hypertension: Secondary | ICD-10-CM | POA: Diagnosis not present

## 2014-09-07 DIAGNOSIS — L84 Corns and callosities: Secondary | ICD-10-CM | POA: Diagnosis not present

## 2014-09-07 DIAGNOSIS — R202 Paresthesia of skin: Secondary | ICD-10-CM | POA: Diagnosis not present

## 2014-09-07 LAB — HEMOGLOBIN A1C: HEMOGLOBIN A1C: 5.8 % (ref 4.0–6.0)

## 2014-09-21 DIAGNOSIS — M79675 Pain in left toe(s): Secondary | ICD-10-CM | POA: Diagnosis not present

## 2014-09-21 DIAGNOSIS — M79674 Pain in right toe(s): Secondary | ICD-10-CM | POA: Diagnosis not present

## 2014-09-21 DIAGNOSIS — Q6689 Other  specified congenital deformities of feet: Secondary | ICD-10-CM | POA: Diagnosis not present

## 2014-09-21 DIAGNOSIS — B351 Tinea unguium: Secondary | ICD-10-CM | POA: Diagnosis not present

## 2014-09-21 DIAGNOSIS — M2012 Hallux valgus (acquired), left foot: Secondary | ICD-10-CM | POA: Diagnosis not present

## 2014-11-03 DIAGNOSIS — B351 Tinea unguium: Secondary | ICD-10-CM | POA: Diagnosis not present

## 2014-11-03 DIAGNOSIS — M79674 Pain in right toe(s): Secondary | ICD-10-CM | POA: Diagnosis not present

## 2014-11-03 DIAGNOSIS — M79675 Pain in left toe(s): Secondary | ICD-10-CM | POA: Diagnosis not present

## 2014-11-14 DIAGNOSIS — R339 Retention of urine, unspecified: Secondary | ICD-10-CM | POA: Diagnosis not present

## 2014-11-14 DIAGNOSIS — C61 Malignant neoplasm of prostate: Secondary | ICD-10-CM | POA: Diagnosis not present

## 2014-11-25 ENCOUNTER — Ambulatory Visit
Admission: RE | Admit: 2014-11-25 | Discharge: 2014-11-25 | Disposition: A | Discharge status: Home / Self Care | Payer: Medicare Other | Source: Ambulatory Visit | Attending: Family Medicine | Admitting: Family Medicine

## 2014-11-25 ENCOUNTER — Other Ambulatory Visit: Payer: Self-pay | Admitting: Family Medicine

## 2014-11-25 DIAGNOSIS — L84 Corns and callosities: Secondary | ICD-10-CM | POA: Diagnosis not present

## 2014-11-25 DIAGNOSIS — R609 Edema, unspecified: Secondary | ICD-10-CM | POA: Diagnosis not present

## 2014-11-25 DIAGNOSIS — M79604 Pain in right leg: Secondary | ICD-10-CM

## 2014-11-25 DIAGNOSIS — R600 Localized edema: Secondary | ICD-10-CM

## 2014-11-25 DIAGNOSIS — M7989 Other specified soft tissue disorders: Secondary | ICD-10-CM | POA: Diagnosis not present

## 2014-11-25 DIAGNOSIS — L0390 Cellulitis, unspecified: Secondary | ICD-10-CM | POA: Diagnosis not present

## 2014-11-25 DIAGNOSIS — M79661 Pain in right lower leg: Secondary | ICD-10-CM | POA: Diagnosis not present

## 2014-11-25 DIAGNOSIS — I10 Essential (primary) hypertension: Secondary | ICD-10-CM | POA: Diagnosis not present

## 2014-12-09 ENCOUNTER — Telehealth: Payer: Self-pay | Admitting: Family Medicine

## 2014-12-09 NOTE — Telephone Encounter (Signed)
Pt wanted to let you know that the cellulitis has improved but is still a little tender. Pt stated he intends on starting back to his walking exercises. Thanks TNP

## 2014-12-13 ENCOUNTER — Ambulatory Visit (INDEPENDENT_AMBULATORY_CARE_PROVIDER_SITE_OTHER): Payer: Medicare Other | Admitting: Urology

## 2014-12-13 ENCOUNTER — Encounter: Payer: Self-pay | Admitting: Urology

## 2014-12-13 VITALS — BP 148/77 | HR 61 | Ht 69.0 in | Wt 183.3 lb

## 2014-12-13 DIAGNOSIS — C61 Malignant neoplasm of prostate: Secondary | ICD-10-CM

## 2014-12-13 DIAGNOSIS — R350 Frequency of micturition: Secondary | ICD-10-CM

## 2014-12-13 LAB — BLADDER SCAN AMB NON-IMAGING: Scan Result: 13

## 2014-12-13 MED ORDER — TAMSULOSIN HCL 0.4 MG PO CAPS: 0.4000 mg | ORAL_CAPSULE | Freq: Every day | ORAL | Status: DC

## 2014-12-13 NOTE — Progress Notes (Signed)
12/13/2014 6:36 AM   [name] [birth year] [id number]  Referring provider: Margarita Rana, MD [address] [place], [place] [address]  Chief Complaint  Patient presents with  . Prostate Cancer    HPI: [name] is an 81 year-old white male who presented to [place] 1 month ago for a daytime frequency and nocturia. His IPSS score at that time was 12/4 (moderate).  He states he was getting up 3 times a night. It was causing a disruption in his sleep.  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 He denied any gross hematuria or urinary tract infections. He also denied any bone pain or appetite loss. He had undergone a cystoscopic exam on 12/03/2012 with 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 His PVR at that visit was found to be 125 mL. He was started on tamsulosin and he presents today for repeat IPSS and PVR.  His IPSS score today is 23/4 (severe) and his PVR is 13 mL.   Patient also has prostate cancer. Patient underwent cryotherapy for a Gleason's 6 (10 cores + out of 12)-invasion present with a PSA of 4.3 and a 11% free PSA PCa in 2009.   PSA History: 0.4 ng/mL on 12/12/2010 0.4 ng/mL on 06/17/2011 0.5 ng/mL on 01/01/2012 0.5 ng/mL on 12/30/2013   0.6 ng/mL on 11/14/2014        PMH: Past Medical History  Diagnosis Date  . Shingles 2014  . Stroke 2012  . Hard of hearing   . Sinus problem   . Osteoporosis   . HBP (high blood pressure)     Surgical History: Past Surgical History  Procedure Laterality Date  . Cholecystectomy  1972  . Cataract extraction  2000, 2001  . Prostate surgery  2008    Home Medications:    Medication List       This list is accurate as of: 12/13/14 11:59 PM.  Always use your most recent med list.               amLODipine 5 MG tablet    Commonly known as:  NORVASC  Take 5 mg by mouth daily.     amLODipine 2.5 MG tablet  Commonly known as:  NORVASC     aspirin 81 MG tablet  Take 81 mg by mouth daily.     econazole nitrate 1 % cream  Apply topically 2 (two) times daily.     hydrochlorothiazide 12.5 MG capsule  Commonly known as:  MICROZIDE  Take 12.5 mg by mouth every morning.     losartan 25 MG tablet  Commonly known as:  COZAAR  Take 25 mg by mouth daily.     multivitamin capsule  Take 1 capsule by mouth daily.     tamsulosin 0.4 MG Caps capsule  Commonly known as:  FLOMAX  Take 1 capsule (0.4 mg total) by mouth daily.     Tavaborole 5 % Soln  Commonly known as:  KERYDIN  APPLY TO AFFECTED TOENAIL DAILY     terbinafine 250 MG tablet  Commonly known as:  LAMISIL  Take by mouth.        Allergies: No Known Allergies  Family History: Family History  Problem Relation Age of Onset  . Cancer Mother   . Stroke Father  Social History:  reports that he quit smoking about 52 years ago. He has never used smokeless tobacco. He reports that he drinks alcohol. He reports that he does not use illicit drugs.  ROS: Urological Symptom Review  Patient is experiencing the following symptoms: Frequent urination Hard to postpone urination Get up at night to urinate Leakage of urine Erection problems (male only)   Review of Systems  Gastrointestinal (upper)  : Negative for upper GI symptoms  Gastrointestinal (lower) : Constipation  Constitutional : Negative for symptoms  Skin: Negative for skin symptoms  Eyes: Negative for eye symptoms  Ear/Nose/Throat : Negative for Ear/Nose/Throat symptoms  Hematologic/Lymphatic: Easy bruising  Cardiovascular : Leg swelling  Respiratory : Cough Shortness of breath  Endocrine: Excessive thirst  Musculoskeletal: Back pain Joint pain  Neurological: Negative for neurological symptoms  Psychologic: Negative for psychiatric  symptoms   Physical Exam: BP 148/77 mmHg  Pulse 61  Ht 5\' 9"  (1.753 m)  Wt 183 lb 4.8 oz (83.144 kg)  BMI 27.06 kg/m2   Laboratory Data: Lab Results  Component Value Date   WBC 4.5 04/25/2012   HGB 14.6 04/25/2012   HCT 43.5 04/25/2012   MCV 92 04/25/2012   PLT 189 04/25/2012    Lab Results  Component Value Date   CREATININE 1.00 04/25/2012    No results found for: PSA  No results found for: TESTOSTERONE  No results found for: HGBA1C  Urinalysis No results found for: COLORURINE, APPEARANCEUR, LABSPEC, PHURINE, GLUCOSEU, HGBUR, BILIRUBINUR, KETONESUR, PROTEINUR, UROBILINOGEN, NITRITE, LEUKOCYTESUR  Pertinent Imaging: Results for ANASTACIO, BUA (MRN [id number]) as of 12/18/2014 06:13  Ref. Range 12/13/2014 15:34  Scan Result Unknown 13     Assessment & Plan:    1. Urinary frequency-  Patient's IPSS score is worse today (23/4) than it was one month ago (12/4). His PVR is improved from 1 month ago (125 mL to 20mL).  I will have the patient to continue tamsulosin 0.4 mg 1 capsule daily.  I have given him Vesicare 5 mg tablets samples to take one daily.  I have advised him of the side effects of Vesicare, such as: Dry eyes, dry mouth, constipation, mental confusion and/or urinary retention.  He will return in 3 weeks' time for IPSS and PVR.  - BLADDER SCAN AMB NON-IMAGING  2. Prostate cancer-  Patient underwent cryotherapy for a Gleason's 6 (10 cores + out of 12)-invasion present with a PSA of 4.3 and a 11% free PSA PCa in 2009.   PSA History: 0.4 ng/mL on 12/12/2010 0.4 ng/mL on 06/17/2011 0.5 ng/mL on 01/01/2012 0.5 ng/mL on 12/30/2013   0.6 ng/mL on 11/14/2014 He will follow-up in 6 months time for DRE and PSA.   No Follow-up on file.  Zara Council, [facility] [address] [place], [place] [address] [phone]

## 2014-12-18 DIAGNOSIS — R350 Frequency of micturition: Secondary | ICD-10-CM | POA: Insufficient documentation

## 2014-12-18 DIAGNOSIS — C61 Malignant neoplasm of prostate: Secondary | ICD-10-CM | POA: Insufficient documentation

## 2014-12-21 ENCOUNTER — Other Ambulatory Visit: Payer: Self-pay

## 2014-12-22 ENCOUNTER — Encounter: Payer: Self-pay | Admitting: Family Medicine

## 2014-12-22 ENCOUNTER — Ambulatory Visit (INDEPENDENT_AMBULATORY_CARE_PROVIDER_SITE_OTHER): Payer: Medicare Other | Admitting: Family Medicine

## 2014-12-22 ENCOUNTER — Telehealth: Payer: Self-pay

## 2014-12-22 VITALS — BP 140/66 | HR 75 | Temp 97.9°F | Resp 16 | Wt 183.8 lb

## 2014-12-22 DIAGNOSIS — I10 Essential (primary) hypertension: Secondary | ICD-10-CM | POA: Diagnosis not present

## 2014-12-22 DIAGNOSIS — R609 Edema, unspecified: Secondary | ICD-10-CM | POA: Diagnosis not present

## 2014-12-22 NOTE — Telephone Encounter (Signed)
Is he set up to start PTNS or do I need to set him up?

## 2014-12-22 NOTE — Telephone Encounter (Signed)
He wants to start PTNS.

## 2014-12-22 NOTE — Progress Notes (Signed)
Subjective:    Patient ID: [name], male    DOB: Nov 11, 1924, 81 y.o.   MRN: [id number]  HPI This 81 year old male states he is feeling well but comes in for follow up of hypertension and family having concern about puffiness of the right lower leg above ankle high socks. No significant improvement or worsening of edema since cutting the Amlodipine down to 2.5 mg qd. Still taking Losartan 25 mg qd and HCTZ 12.5 mg qd. No discomfort or dyspnea. States he has noticed some change in the right lower leg with weakness in the both the right arm and leg since stroke 4 years ago.  Past Medical History  Diagnosis Date  . Shingles 2014  . Stroke 2012  . Hard of hearing   . Sinus problem   . Osteoporosis   . HBP (high blood pressure)    History  Substance Use Topics  . Smoking status: Former Smoker -- 10 years    Quit date: 07/08/1962  . Smokeless tobacco: Never Used  . Alcohol Use: Yes     Comment: beer occasionally   Family History  Problem Relation Age of Onset  . Cancer Mother   . Stroke Father    Current Outpatient Prescriptions on File Prior to Visit  Medication Sig Dispense Refill  . amLODipine (NORVASC) 2.5 MG tablet     . aspirin 81 MG tablet Take 81 mg by mouth daily.    . hydrochlorothiazide (MICROZIDE) 12.5 MG capsule Take 12.5 mg by mouth every morning.     [name] losartan (COZAAR) 25 MG tablet Take 25 mg by mouth daily.     . Multiple Vitamin (MULTIVITAMIN) capsule Take 1 capsule by mouth daily.    . tamsulosin (FLOMAX) 0.4 MG CAPS capsule Take 1 capsule (0.4 mg total) by mouth daily. 30 capsule 12  . terbinafine (LAMISIL) 250 MG tablet     . amLODipine (NORVASC) 5 MG tablet Take 5 mg by mouth daily.     [name] econazole nitrate 1 % cream Apply topically 2 (two) times daily. (Patient not taking: Reported on 12/22/2014) 85 g 5  . Tavaborole (KERYDIN) 5 % SOLN APPLY TO AFFECTED TOENAIL DAILY (Patient not taking: Reported on 12/22/2014) 4 mL 11   No current  facility-administered medications on file prior to visit.   No Known Allergies  Review of Systems  Constitutional: Negative.   HENT: Negative.        Hearing loss unchanged - continues hearing aids.  Eyes: Negative.   Respiratory: Negative.   Cardiovascular: Negative.   Gastrointestinal: Negative.   Genitourinary: Negative.   Musculoskeletal:       Puffiness of right lower leg - present since stroke affecting the right side of his body       Objective:   Physical Exam  Constitutional: He appears well-nourished.  HENT:  Head: Normocephalic.  Nose: Nose normal.  Mouth/Throat: Oropharynx is clear and moist.  Bilateral hearing loss. Continues bilateral hearing aids full time.  Eyes: EOM are normal. Pupils are equal, round, and reactive to light.  Neck: Neck supple.  Cardiovascular: Normal rate, regular rhythm, normal heart sounds and intact distal pulses.   Mild puffiness above right sock  Pulmonary/Chest: Effort normal and breath sounds normal.  Abdominal: Soft. Bowel sounds are normal.  Musculoskeletal:  Slight weakness in right leg and hand since stroke 4 years ago. Uses cane for support but can walk without it.   BP 140/66 mmHg  Pulse 75  Temp(Src)  97.9 F (36.6 C) (Oral)  Resp 16  Wt 183 lb 12.8 oz (83.371 kg)  SpO2 96%     Assessment & Plan:  1. Peripheral edema Mild puffiness in right lower leg above ankle-high sock. No discomfort or ulcerations. Good pulses. States he has noticed some swelling since his stroke affecting the right side of his body 4 years ago. Despite decreasing Amlodipine dosage to 2.5 mg qd, he and his family continue to notice the puffiness. With elevation of systolic pressure, will increase HCTZ to 25 mg qd. Encouraged to use knee high support hose. Recheck in 2 week.  2. Essential hypertension BP higher than past readings since lowering the Amlodipine. Continue Losartan 25 mg qd and increase HCTZ to 25 mg qd. Recheck readings at home and  follow up in the clinic in 2 weeks. Encouraged to limit sodium intake and continue fluids.

## 2014-12-22 NOTE — Telephone Encounter (Signed)
Pt stopped by today to make you aware he stopped his toviaz. Medication was upsetting his stomach. Cw,lpn

## 2014-12-23 NOTE — Telephone Encounter (Signed)
He is not set up.  His wife is also going to be having PTNS, so they need to have appointments right after each other on the same day.

## 2015-01-03 ENCOUNTER — Ambulatory Visit (INDEPENDENT_AMBULATORY_CARE_PROVIDER_SITE_OTHER): Payer: Medicare Other | Admitting: Urology

## 2015-01-03 VITALS — BP 132/72 | HR 69 | Ht 69.0 in | Wt 184.7 lb

## 2015-01-03 DIAGNOSIS — C61 Malignant neoplasm of prostate: Secondary | ICD-10-CM

## 2015-01-03 DIAGNOSIS — R350 Frequency of micturition: Secondary | ICD-10-CM | POA: Diagnosis not present

## 2015-01-03 LAB — URINALYSIS, COMPLETE
Bilirubin, UA: NEGATIVE
GLUCOSE, UA: NEGATIVE
KETONES UA: NEGATIVE
Leukocytes, UA: NEGATIVE
Nitrite, UA: NEGATIVE
PROTEIN UA: NEGATIVE
RBC, UA: NEGATIVE
Specific Gravity, UA: 1.015 (ref 1.005–1.030)
Urobilinogen, Ur: 0.2 mg/dL (ref 0.2–1.0)
pH, UA: 7 (ref 5.0–7.5)

## 2015-01-03 LAB — MICROSCOPIC EXAMINATION
BACTERIA UA: NONE SEEN
RBC, UA: NONE SEEN /hpf (ref 0–?)

## 2015-01-03 LAB — BLADDER SCAN AMB NON-IMAGING: Scan Result: 120

## 2015-01-03 NOTE — Progress Notes (Signed)
01/03/2015 2:35 PM   [name] Jun 21, 1925 [id number]  Referring provider: Margarita Rana, MD [address] [place], [place] [address]  Chief Complaint  Patient presents with  . Urinary Frequency    the pt also complains of urgency, the pt discontinued the Toviaz after 5 days because it caused Acid Reflux.     HPI: [name] is an 81 year old white male who presents today for a three week follow up after being placed on Toviaz for urinary frequency.  Unfortunately, he had to discontinue the medication after taking it for 5 days due to GERD.  He did fill out an IPSS score sheet today and the score was 16/4 (moderate).  This is an improvement from his score three weeks ago which was 23/4.  He did find the Toviaz helpful in reducing his nocturia.     When he initially saw [place] on 11/14/2014, he had a large residual and tamsulosin was prescribed.  This aided in reducing his PVR, but he did not find relief in his urinary incontinence.  That is when he had a trial of the Toviaz.  He is continuing the tamsulosin at this time.  His PVR today is 120 mL.        IPSS      12/13/14 1500 01/03/15 1400     International Prostate Symptom Score   How often have you had the sensation of not emptying your bladder? Almost always More than half the time    How often have you had to urinate less than every two hours? Almost always Almost always    How often have you found you stopped and started again several times when you urinated? Almost always Not at All    How often have you found it difficult to postpone urination? Almost always About half the time    How often have you had a weak urinary stream? Not at All Not at All    How often have you had to strain to start urination? Not at All Not at All    How many times did you typically get up at night to urinate? 3 Times 3 Times    Total IPSS Score 23 15    Quality of Life due to urinary symptoms   If you were to spend the rest of  your life with your urinary condition just the way it is now how would you feel about that? Mostly Disatisfied Mostly Disatisfied       Score:  1-7 Mild 8-19 Moderate 20-35 Severe PMH: Past Medical History  Diagnosis Date  . Shingles 2014  . Stroke 2012  . Hard of hearing   . Sinus problem   . Osteoporosis   . HBP (high blood pressure)     Surgical History: Past Surgical History  Procedure Laterality Date  . Cholecystectomy  1972  . Cataract extraction  2000, 2001  . Prostate surgery  2008    Home Medications:    Medication List       This list is accurate as of: 01/03/15  2:35 PM.  Always use your most recent med list.               amLODipine 2.5 MG tablet  Commonly known as:  NORVASC     aspirin 81 MG tablet  Take 81 mg by mouth daily.     hydrochlorothiazide 12.5 MG capsule  Commonly known as:  MICROZIDE  Take 25 mg by mouth every  morning.     losartan 25 MG tablet  Commonly known as:  COZAAR  Take 25 mg by mouth daily.     multivitamin capsule  Take 1 capsule by mouth daily.     OVER THE COUNTER MEDICATION  Take by mouth 1 day or 1 dose.     tamsulosin 0.4 MG Caps capsule  Commonly known as:  FLOMAX  Take 1 capsule (0.4 mg total) by mouth daily.     Tavaborole 5 % Soln  Commonly known as:  KERYDIN  APPLY TO AFFECTED TOENAIL DAILY     terbinafine 250 MG tablet  Commonly known as:  LAMISIL     vitamin B-12 1000 MCG tablet  Commonly known as:  CYANOCOBALAMIN  Take 1,000 mcg by mouth daily.        Allergies:  Allergies  Allergen Reactions  . Toviaz [Fesoterodine Fumarate Er] Other (See Comments)    Acid reflux    Family History: Family History  Problem Relation Age of Onset  . Cancer Mother   . Stroke Father     Social History:  reports that he quit smoking about 52 years ago. He has never used smokeless tobacco. He reports that he drinks alcohol. He reports that he does not use illicit drugs.  ROS: Urological Symptom  Review  Patient is experiencing the following symptoms: Frequent urination Get up at night to urinate Leakage of urine   Review of Systems  Gastrointestinal (upper)  : Negative for upper GI symptoms  Gastrointestinal (lower) : Negative for lower GI symptoms  Constitutional : Negative for symptoms  Skin: Negative for skin symptoms  Eyes: Negative for eye symptoms  Ear/Nose/Throat : Negative for Ear/Nose/Throat symptoms  Hematologic/Lymphatic: Negative for Hematologic/Lymphatic symptoms  Cardiovascular : Negative for cardiovascular symptoms  Respiratory : Negative for respiratory symptoms  Endocrine: Negative for endocrine symptoms  Musculoskeletal: Negative for musculoskeletal symptoms  Neurological: Negative for neurological symptoms  Psychologic: Negative for psychiatric symptoms   Physical Exam: BP 132/72 mmHg  Pulse 69  Ht 5\' 9"  (1.753 m)  Wt 184 lb 11.2 oz (83.779 kg)  BMI 27.26 kg/m2   Laboratory Data: Results for orders placed or performed in visit on 01/03/15  Microscopic Examination  Result Value Ref Range   WBC, UA 0-5 0 -  5 /hpf   RBC, UA None seen 0 -  2 /hpf   Epithelial Cells (non renal) 0-10 0 - 10 /hpf   Bacteria, UA None seen None seen/Few  Urinalysis, Complete  Result Value Ref Range   Specific Gravity, UA 1.015 1.005 - 1.030   pH, UA 7.0 5.0 - 7.5   Color, UA Yellow Yellow   Appearance Ur Clear Clear   Leukocytes, UA Negative Negative   Protein, UA Negative Negative/Trace   Glucose, UA Negative Negative   Ketones, UA Negative Negative   RBC, UA Negative Negative   Bilirubin, UA Negative Negative   Urobilinogen, Ur 0.2 0.2 - 1.0 mg/dL   Nitrite, UA Negative Negative   Microscopic Examination See below:   Bladder Scan (Post Void Residual) in office  Result Value Ref Range   Scan Result 120    Lab Results  Component Value Date   WBC 4.5 04/25/2012   HGB 14.6 04/25/2012   HCT 43.5 04/25/2012   MCV 92 04/25/2012    PLT 189 04/25/2012    Lab Results  Component Value Date   CREATININE 1.00 04/25/2012    No results found for: PSA  No results found  for: TESTOSTERONE  No results found for: HGBA1C  Urinalysis No results found for: COLORURINE, APPEARANCEUR, LABSPEC, PHURINE, GLUCOSEU, HGBUR, BILIRUBINUR, KETONESUR, PROTEINUR, UROBILINOGEN, NITRITE, LEUKOCYTESUR  Pertinent Imaging:   Assessment & Plan:    1. Urinary frequency:  Patient could not tolerate anticholinergics due to GERD.  He cannot try Myrbetriq due to his BP issues.  He would like to pursue PTNS.  He will be scheduled for this.    - Urinalysis, Complete - Bladder Scan (Post Void Residual) in office  2. Prostate cancer: Patient underwent cryotherapy for a Gleason's 6 (10 cores + out of 12)-invasion present with a PSA of 4.3 and a 11% free PSA PCa in 2009.   PSA History: 0.4 ng/mL on 12/12/2010 0.4 ng/mL on 06/17/2011 0.5 ng/mL on 01/01/2012 0.5 ng/mL on 12/30/2013  0.6 ng/mL on 11/14/2014  He will follow-up in 5 months time for DRE and PSA. No Follow-up on file.  Zara Council, [facility] [address] [place], [place] [address] [phone]

## 2015-01-04 ENCOUNTER — Encounter: Payer: Self-pay | Admitting: Urology

## 2015-01-05 ENCOUNTER — Encounter: Payer: Self-pay | Admitting: Family Medicine

## 2015-01-05 ENCOUNTER — Ambulatory Visit (INDEPENDENT_AMBULATORY_CARE_PROVIDER_SITE_OTHER): Payer: Medicare Other | Admitting: Family Medicine

## 2015-01-05 VITALS — BP 144/72 | HR 68 | Temp 97.8°F | Resp 16 | Wt 188.2 lb

## 2015-01-05 DIAGNOSIS — R609 Edema, unspecified: Secondary | ICD-10-CM

## 2015-01-05 DIAGNOSIS — I10 Essential (primary) hypertension: Secondary | ICD-10-CM | POA: Diagnosis not present

## 2015-01-05 NOTE — Progress Notes (Signed)
Subjective:    Patient ID: [name], male    DOB: 31-Mar-1925, 81 y.o.   MRN: [id number]  HPI Follow-up hypertension and peripheral edema. Tried the increase of HCTZ to 25 mg qd but did not feel it helped with the edema. Started using the support hose and this seemed to control the edema better. If he forgets to use them, the swelling/puffiness will return. Presently using HCTZ 12,5 mg qd, Losartan 25 mg qd and Amlodipine 2.5 mg qd without significant side effects. Some fatigue at times but no dyspnea, chest pains or palpitations.  Past Medical History  Diagnosis Date  . Shingles 2014  . Stroke 2012  . Hard of hearing   . Sinus problem   . Osteoporosis   . HBP (high blood pressure)    Past Surgical History  Procedure Laterality Date  . Cholecystectomy  1972  . Cataract extraction  2000, 2001  . Prostate surgery  2008   History  Substance Use Topics  . Smoking status: Former Smoker -- 10 years    Quit date: 07/08/1962  . Smokeless tobacco: Never Used  . Alcohol Use: Yes     Comment: beer occasionally   Family History  Problem Relation Age of Onset  . Cancer Mother   . Stroke Father    Current Outpatient Prescriptions on File Prior to Visit  Medication Sig Dispense Refill  . amLODipine (NORVASC) 2.5 MG tablet     . aspirin 81 MG tablet Take 81 mg by mouth daily.    . hydrochlorothiazide (MICROZIDE) 12.5 MG capsule Take 25 mg by mouth every morning.    [name] losartan (COZAAR) 25 MG tablet Take 25 mg by mouth daily.     . Multiple Vitamin (MULTIVITAMIN) capsule Take 1 capsule by mouth daily.    [name] OVER THE COUNTER MEDICATION Take by mouth 1 day or 1 dose.    . tamsulosin (FLOMAX) 0.4 MG CAPS capsule Take 1 capsule (0.4 mg total) by mouth daily. 30 capsule 12  . terbinafine (LAMISIL) 250 MG tablet     . vitamin B-12 (CYANOCOBALAMIN) 1000 MCG tablet Take 1,000 mcg by mouth daily.     No current facility-administered medications on file prior to visit.   Allergies   Allergen Reactions  . Toviaz [Fesoterodine Fumarate Er] Other (See Comments)    Acid reflux   Review of Systems  HENT: Negative.   Respiratory: Negative.   Cardiovascular: Negative.   Gastrointestinal: Negative.      BP 144/72 mmHg  Pulse 68  Temp(Src) 97.8 F (36.6 C) (Oral)  Resp 16  Wt 188 lb 3.2 oz (85.367 kg)   Objective:   Physical Exam  Constitutional: He is oriented to person, place, and time. He appears well-developed and well-nourished. No distress.  HENT:  Head: Normocephalic and atraumatic.  Right Ear: Hearing normal.  Left Ear: Hearing normal.  Nose: Nose normal.  Eyes: Conjunctivae and lids are normal. Right eye exhibits no discharge. Left eye exhibits no discharge. No scleral icterus.  Cardiovascular: Normal rate, regular rhythm and normal heart sounds.   Pulmonary/Chest: Effort normal. No respiratory distress.  Musculoskeletal: Normal range of motion.  Minimal puffiness above the right sock (not wearing support hose today). Some prominent vessels in both lower legs - suspect varicose veins.  Neurological: He is alert and oriented to person, place, and time.  Skin: Skin is intact. No lesion and no rash noted.  Psychiatric: He has a normal mood and affect. His speech is normal  and behavior is normal. Thought content normal.      Assessment & Plan:   1. Peripheral edema Improvement with use of support hose. Will check routine labs and follow up pending reports. No claudication identified. - Comprehensive metabolic panel - CBC with Differential/Platelet  2. Essential hypertension Stable BP. States he gets lower readings at home (110-130/60-80). Will decrease HCTZ back to 12.5 mg qd. Continue Losartan 25 mg qd and Amlodipine 2.5 mg qd. Encouraged to limit sodium intake and check routine follow up labs. Keep appointment with Dr. Venia Minks on 03-15-15. - Comprehensive metabolic panel - CBC with Differential/Platelet

## 2015-01-06 DIAGNOSIS — I10 Essential (primary) hypertension: Secondary | ICD-10-CM | POA: Diagnosis not present

## 2015-01-06 DIAGNOSIS — R609 Edema, unspecified: Secondary | ICD-10-CM | POA: Diagnosis not present

## 2015-01-07 LAB — COMPREHENSIVE METABOLIC PANEL
ALBUMIN: 4.3 g/dL (ref 3.5–4.7)
ALK PHOS: 67 IU/L (ref 39–117)
ALT: 16 IU/L (ref 0–44)
AST: 24 IU/L (ref 0–40)
Albumin/Globulin Ratio: 2 (ref 1.1–2.5)
BUN/Creatinine Ratio: 18 (ref 10–22)
BUN: 13 mg/dL (ref 8–27)
Bilirubin Total: 0.5 mg/dL (ref 0.0–1.2)
CHLORIDE: 100 mmol/L (ref 97–108)
CO2: 25 mmol/L (ref 18–29)
Calcium: 9.8 mg/dL (ref 8.6–10.2)
Creatinine, Ser: 0.72 mg/dL — ABNORMAL LOW (ref 0.76–1.27)
GFR calc non Af Amer: 84 mL/min/{1.73_m2} (ref >59)
GFR, EST AFRICAN AMERICAN: 97 mL/min/{1.73_m2} (ref >59)
GLUCOSE: 123 mg/dL — AB (ref 65–99)
Globulin, Total: 2.2 g/dL (ref 1.5–4.5)
Potassium: 4.4 mmol/L (ref 3.5–5.2)
Sodium: 140 mmol/L (ref 134–144)
Total Protein: 6.5 g/dL (ref 6.0–8.5)

## 2015-01-07 LAB — CBC WITH DIFFERENTIAL/PLATELET
BASOS ABS: 0 10*3/uL (ref 0.0–0.2)
Basos: 1 %
EOS (ABSOLUTE): 0.1 10*3/uL (ref 0.0–0.4)
EOS: 3 %
HEMATOCRIT: 42.2 % (ref 37.5–51.0)
HEMOGLOBIN: 14.2 g/dL (ref 12.6–17.7)
Immature Grans (Abs): 0 10*3/uL (ref 0.0–0.1)
Immature Granulocytes: 1 %
LYMPHS: 27 %
Lymphocytes Absolute: 1.1 10*3/uL (ref 0.7–3.1)
MCH: 30.5 pg (ref 26.6–33.0)
MCHC: 33.6 g/dL (ref 31.5–35.7)
MCV: 91 fL (ref 79–97)
Monocytes Absolute: 0.5 10*3/uL (ref 0.1–0.9)
Monocytes: 11 %
Neutrophils Absolute: 2.3 10*3/uL (ref 1.4–7.0)
Neutrophils: 57 %
PLATELETS: 221 10*3/uL (ref 150–379)
RBC: 4.65 x10E6/uL (ref 4.14–5.80)
RDW: 14.2 % (ref 12.3–15.4)
WBC: 4 10*3/uL (ref 3.4–10.8)

## 2015-01-12 ENCOUNTER — Telehealth: Payer: Self-pay

## 2015-01-12 NOTE — Telephone Encounter (Signed)
Patient advised as directed below. Patient verbalized understanding and agrees with treatment plan. 

## 2015-01-12 NOTE — Telephone Encounter (Signed)
-----   Message from [place], [place] sent at 01/12/2015 12:44 AM EDT ----- Final report of labs show borderline elevation of blood sugar but remainder of tests are normal. Watch sugar and fats in diet. Recheck levels in 3 months for further changes.

## 2015-01-13 NOTE — Telephone Encounter (Signed)
Patient is scheduled for PTNS on 01/25/15.

## 2015-01-20 ENCOUNTER — Encounter: Payer: Self-pay | Admitting: *Deleted

## 2015-01-25 ENCOUNTER — Encounter: Payer: Self-pay | Admitting: Urology

## 2015-01-25 ENCOUNTER — Ambulatory Visit (INDEPENDENT_AMBULATORY_CARE_PROVIDER_SITE_OTHER): Payer: Medicare Other | Admitting: Urology

## 2015-01-25 VITALS — BP 144/63 | HR 68 | Ht 69.0 in | Wt 181.1 lb

## 2015-01-25 DIAGNOSIS — R350 Frequency of micturition: Secondary | ICD-10-CM

## 2015-01-25 LAB — PTNS-PERCUTANEOUS TIBIAL NERVE STIMULATION: Scan Result: 19

## 2015-01-25 NOTE — Progress Notes (Signed)
Chief Complaint:  Chief Complaint  Patient presents with  . Urinary Frequency    PTNS     HPI: [name] is an 81 year old white male who has a long-standing history of urinary frequency and dribbling.  He cannot recall a specific event when his urinary symptoms started.  He states he has a constant dribbling of a small amount of urine. This occurs all day.  He has not experienced an increase in the dribbling with coughing, sneezing, laughing, change in position or bending and lifting.      He is making a trip to the bathroom every hour during the daytime. He states he gets up 3 times nightly. He is not experiencing burning or pain with urination. He has a mild urgency to urinate. He only uses depends at night and they are not wet when he wakes up in the morning.  He does not consume any caffeine beverages, he has 1-2 glasses of wine daily and he drinks eight 8 ounce glasses of water daily.          Previous Therapy:          He has been tried on tamsulosin and Toviaz without improvement in his urinary symptoms.       BP 144/63 mmHg  Pulse 68  Ht 5\' 9"  (1.753 m)  Wt 181 lb 1.6 oz (82.146 kg)  BMI 26.73 kg/m2  He does not have any contraindications present for PTNS, such as: Pacemaker, Implantable defibrillator, History of abnormal bleeding or History of neuropathies or nerve damage.Contraindications present for PTNS       Discussed with patient possible complications of procedure, such as discomfort, bleeding at insertion/stimulation site, procedure consent signed  Patient goals:     The patient's goals were to reduce his urgency and his frequency.  PTNS treatment:    The needle electrode was inserted into the lower, inner aspect of the patient's left leg. The surface electrode was placed on the           inside arch of the foot on the treatment leg. The lead set was connected to the stimulator, and the needle electrode clip was               connected to the needle electrode.  The stimulator that produces an adjustable electrical pulse that travels to sacral nerve                    plexus via the tibial nerve was increased to 19 and a toe flex response was observed.    Treatment Plan:     Patient tolerated the PTNS treatment well for 30 minutes. The electrode was removed with out difficulty. He will return in one               weekfor his second PTNS treatment.          Patient has prostate cancer:   Patient underwent cryotherapy for a Gleason's 6 (10 cores + out of 12)-invasion present with a PSA of 4.3 and a 11% free PSA PCa in 2009.   PSA History:       0.4 ng/mL on 12/12/2010       0.4 ng/mL on 06/17/2011       0.5 ng/mL on 01/01/2012       0.5 ng/mL on 12/30/2013       0.6 ng/mL on 11/14/2014

## 2015-02-01 ENCOUNTER — Ambulatory Visit: Payer: Medicare Other | Admitting: Urology

## 2015-02-02 DIAGNOSIS — M79675 Pain in left toe(s): Secondary | ICD-10-CM | POA: Diagnosis not present

## 2015-02-02 DIAGNOSIS — M79674 Pain in right toe(s): Secondary | ICD-10-CM | POA: Diagnosis not present

## 2015-02-02 DIAGNOSIS — B351 Tinea unguium: Secondary | ICD-10-CM | POA: Diagnosis not present

## 2015-02-03 ENCOUNTER — Encounter: Payer: Self-pay | Admitting: Urology

## 2015-02-03 ENCOUNTER — Ambulatory Visit (INDEPENDENT_AMBULATORY_CARE_PROVIDER_SITE_OTHER): Payer: Medicare Other | Admitting: Urology

## 2015-02-03 VITALS — BP 150/76 | HR 65 | Ht 69.0 in | Wt 181.4 lb

## 2015-02-03 DIAGNOSIS — R350 Frequency of micturition: Secondary | ICD-10-CM

## 2015-02-05 NOTE — Progress Notes (Signed)
Chief Complaint:  Chief Complaint  Patient presents with  . PTNS     HPI: [name] is an 81 year old white male who has a long-standing history of urinary frequency and dribbling.  He cannot recall a specific event when his urinary symptoms started.  He states he has a constant dribbling of a small amount of urine. This occurs all day.  He has not experienced an increase in the dribbling with coughing, sneezing, laughing, change in position or bending and lifting.      He is making 9 to 11 trips to the bathroom during the daytime. He states he gets up 3 times nightly. He is not experiencing burning or pain with urination. He has a mild urgency to urinate. He only uses depends at night and they are not wet when he wakes up in the morning.  He does not consume any caffeine beverages, he has 1-2 glasses of wine daily and he drinks eight 8 ounce glasses of water daily.          Previous Therapy:          He has been tried on tamsulosin and Toviaz without improvement in his urinary symptoms.       BP 150/76 mmHg  Pulse 65  Ht 5\' 9"  (1.753 m)  Wt 181 lb 6.4 oz (82.283 kg)  BMI 26.78 kg/m2  He does not have any contraindications present for PTNS, such as: Pacemaker, Implantable defibrillator, History of abnormal bleeding or History of neuropathies or nerve damage.Contraindications present for PTNS       Discussed with patient possible complications of procedure, such as discomfort, bleeding at insertion/stimulation site, procedure consent signed  Patient goals:     The patient's goals were to reduce his urgency and his frequency.  PTNS treatment:    The needle electrode was inserted into the lower, inner aspect of the patient's right leg. The surface electrode was placed on the inside arch of the foot on the treatment leg. The lead set was connected to the stimulator, and the needle electrode clip was               connected to the needle electrode. The stimulator that produces an  adjustable electrical pulse that travels to sacral nerve plexus via the tibial nerve was increased to 4 and both a toe flex and sensory response was observed.    Treatment Plan:     Patient tolerated the PTNS treatment well for 30 minutes. The electrode was removed with out difficulty. He will return in one weekfor his third PTNS treatment.          Patient has prostate cancer:   Patient underwent cryotherapy for a Gleason's 6 (10 cores + out of 12)-invasion present with a PSA of 4.3 and a 11% free PSA PCa in 2009.   PSA History:       0.4 ng/mL on 12/12/2010       0.4 ng/mL on 06/17/2011       0.5 ng/mL on 01/01/2012       0.5 ng/mL on 12/30/2013       0.6 ng/mL on 11/14/2014

## 2015-02-08 ENCOUNTER — Encounter: Payer: Self-pay | Admitting: Urology

## 2015-02-08 ENCOUNTER — Ambulatory Visit (INDEPENDENT_AMBULATORY_CARE_PROVIDER_SITE_OTHER): Payer: Medicare Other | Admitting: Urology

## 2015-02-08 VITALS — BP 110/64 | HR 65 | Ht 69.0 in | Wt 180.2 lb

## 2015-02-08 DIAGNOSIS — R350 Frequency of micturition: Secondary | ICD-10-CM | POA: Diagnosis not present

## 2015-02-08 LAB — PTNS-PERCUTANEOUS TIBIAL NERVE STIMULATION: Scan Result: 14

## 2015-02-08 NOTE — Progress Notes (Signed)
Chief Complaint:  Chief Complaint  Patient presents with  . Urinary Frequency    PTNS     HPI: [name] is an 81 year old white male who has a long-standing history of urinary frequency and dribbling.  He cannot recall a specific event when his urinary symptoms started.  He states he has a constant dribbling of a small amount of urine. This occurs all day.  He has not experienced an increase in the dribbling with coughing, sneezing, laughing, change in position or bending and lifting.      He is making 8 trips to the bathroom during the daytime. He states he gets up 2 times nightly. He is not experiencing burning or pain with urination. He has a mild urgency to urinate. He only uses depends at night and they are not wet when he wakes up in the morning.  He does not consume any caffeine beverages, he has 1-2 glasses of wine daily and he drinks eight 8 ounce glasses of water daily.          Previous Therapy:          He has been tried on tamsulosin and Toviaz without improvement in his urinary symptoms.       BP 110/64 mmHg  Pulse 65  Ht 5\' 9"  (1.753 m)  Wt 180 lb 3.2 oz (81.738 kg)  BMI 26.60 kg/m2  He does not have any contraindications present for PTNS, such as: Pacemaker, Implantable defibrillator, History of abnormal bleeding or History of neuropathies or nerve damage.Contraindications present for PTNS       Discussed with patient possible complications of procedure, such as discomfort, bleeding at insertion/stimulation site, procedure consent signed  Patient goals:     The patient's goals were to reduce his urgency and his frequency.  PTNS treatment:    The needle electrode was inserted into the lower, inner aspect of the patient's left leg. The surface electrode was placed on the inside arch of the foot on the treatment leg. The lead set was connected to the stimulator, and the needle electrode clip was               connected to the needle electrode. The stimulator that  produces an adjustable electrical pulse that travels to sacral nerve plexus via the tibial nerve was increased to 14 and both a toe flex and sensory response was observed.    Treatment Plan:     Patient tolerated the PTNS treatment well for 30 minutes. The electrode was removed with out difficulty. He will return in one weekfor his fourth PTNS treatment.          Patient has prostate cancer:   Patient underwent cryotherapy for a Gleason's 6 (10 cores + out of 12)-invasion present with a PSA of 4.3 and a 11% free PSA PCa in 2009.   PSA History:       0.4 ng/mL on 12/12/2010       0.4 ng/mL on 06/17/2011       0.5 ng/mL on 01/01/2012       0.5 ng/mL on 12/30/2013       0.6 ng/mL on 11/14/2014

## 2015-02-22 ENCOUNTER — Encounter: Payer: Self-pay | Admitting: Urology

## 2015-02-22 ENCOUNTER — Ambulatory Visit (INDEPENDENT_AMBULATORY_CARE_PROVIDER_SITE_OTHER): Payer: Medicare Other | Admitting: Urology

## 2015-02-22 VITALS — BP 112/69 | HR 71 | Ht 69.0 in | Wt 178.8 lb

## 2015-02-22 DIAGNOSIS — R350 Frequency of micturition: Secondary | ICD-10-CM | POA: Diagnosis not present

## 2015-02-22 LAB — PTNS-PERCUTANEOUS TIBIAL NERVE STIMULATION: Scan Result: 11

## 2015-02-22 NOTE — Progress Notes (Signed)
Chief Complaint:  Chief Complaint  Patient presents with  . Urinary Frequency    PTNS     HPI: [name] is an 81 year old white male who has a long-standing history of urinary frequency and dribbling.  He cannot recall a specific event when his urinary symptoms started.  He states he has a constant dribbling of a small amount of urine. This occurs all day.  He has not experienced an increase in the dribbling with coughing, sneezing, laughing, change in position or bending and lifting.      He is making 9 trips to the bathroom during the daytime. He states he gets up 2 times nightly. He is not experiencing burning or pain with urination. He has a mild urgency to urinate. He is having two episodes of incontinence daily.   He does not consume any caffeine beverages, he has 1-2 glasses of wine daily and he drinks eight 8 ounce glasses of water daily.          Previous Therapy:          He has been tried on tamsulosin and Toviaz without improvement in his urinary symptoms.       BP 112/69 mmHg  Pulse 71  Ht 5\' 9"  (1.753 m)  Wt 178 lb 12.8 oz (81.103 kg)  BMI 26.39 kg/m2  He does not have any contraindications present for PTNS, such as: Pacemaker, Implantable defibrillator, History of abnormal bleeding or History of neuropathies or nerve damage.Contraindications present for PTNS       Discussed with patient possible complications of procedure, such as discomfort, bleeding at insertion/stimulation site, procedure consent signed  Patient goals:     The patient's goals were to reduce his urgency and his frequency.  PTNS treatment:    The needle electrode was inserted into the lower, inner aspect of the patient's right leg. The surface electrode was placed on the inside arch of the foot on the treatment leg. The lead set was connected to the stimulator, and the needle electrode clip was               connected to the needle electrode. The stimulator that produces an adjustable electrical  pulse that travels to sacral nerve plexus via the tibial nerve was increased to 11 and both a toe flex and sensory response was observed.    Treatment Plan:     Patient tolerated the PTNS treatment well for 30 minutes. The electrode was removed with out difficulty. He will return in one weekfor his fifth PTNS treatment.          Patient has prostate cancer:   Patient underwent cryotherapy for a Gleason's 6 (10 cores + out of 12)-invasion present with a PSA of 4.3 and a 11% free PSA PCa in 2009.   PSA History:       0.4 ng/mL on 12/12/2010       0.4 ng/mL on 06/17/2011       0.5 ng/mL on 01/01/2012       0.5 ng/mL on 12/30/2013       0.6 ng/mL on 11/14/2014

## 2015-03-01 ENCOUNTER — Ambulatory Visit (INDEPENDENT_AMBULATORY_CARE_PROVIDER_SITE_OTHER): Payer: Medicare Other | Admitting: Urology

## 2015-03-01 ENCOUNTER — Encounter: Payer: Self-pay | Admitting: Urology

## 2015-03-01 VITALS — BP 121/72 | HR 60 | Ht 69.0 in | Wt 181.2 lb

## 2015-03-01 DIAGNOSIS — R350 Frequency of micturition: Secondary | ICD-10-CM

## 2015-03-01 LAB — PTNS-PERCUTANEOUS TIBIAL NERVE STIMULATION: Scan Result: 19

## 2015-03-01 NOTE — Progress Notes (Signed)
Chief Complaint:  Chief Complaint  Patient presents with  . Urinary Frequency    PTNS     HPI: [name] is an 81 year old white male who has a long-standing history of urinary frequency and dribbling.  He cannot recall a specific event when his urinary symptoms started.  He states he has a constant dribbling of a small amount of urine. This occurs all day.  He has not experienced an increase in the dribbling with coughing, sneezing, laughing, change in position or bending and lifting.      He is making 10 (worsening) trips to the bathroom during the daytime. He states he gets up 2 (unchanged )times nightly. He is not experiencing burning or pain with urination. He has a mild urgency to urinate. He is having two episodes of incontinence daily.   He does not consume any caffeine beverages, he has 1-2 glasses of wine daily and he drinks eight 8 ounce glasses of water daily.          Previous Therapy:          He has been tried on tamsulosin and Toviaz without improvement in his urinary symptoms.       Blood pressure 121/72, pulse 60, height 5\' 9"  (1.753 m), weight 181 lb 3.2 oz (82.192 kg).  He does not have any contraindications present for PTNS, such as: Pacemaker, Implantable defibrillator, History of abnormal bleeding or History of neuropathies or nerve damage.Contraindications present for PTNS       Discussed with patient possible complications of procedure, such as discomfort, bleeding at insertion/stimulation site, procedure consent signed  Patient goals:     The patient's goals were to reduce his urgency and his frequency.  PTNS treatment:    The needle electrode was inserted into the lower, inner aspect of the patient's right leg. The surface electrode was placed on the inside arch of the foot on the treatment leg. The lead set was connected to the stimulator, and the needle electrode clip was               connected to the needle electrode. The stimulator that produces an  adjustable electrical pulse that travels to sacral nerve plexus via the tibial nerve was increased to 19 and a sensory response was observed.    Treatment Plan:     Patient tolerated the PTNS treatment well for 30 minutes. The electrode was removed with out difficulty. He will return in one weekfor his sixth PTNS treatment.          Patient has prostate cancer:   Patient underwent cryotherapy for a Gleason's 6 (10 cores + out of 12)-invasion present with a PSA of 4.3 and a 11% free PSA PCa in 2009.   PSA History:       0.4 ng/mL on 12/12/2010       0.4 ng/mL on 06/17/2011       0.5 ng/mL on 01/01/2012       0.5 ng/mL on 12/30/2013       0.6 ng/mL on 11/14/2014

## 2015-03-08 ENCOUNTER — Ambulatory Visit (INDEPENDENT_AMBULATORY_CARE_PROVIDER_SITE_OTHER): Payer: Medicare Other | Admitting: Urology

## 2015-03-08 ENCOUNTER — Encounter: Payer: Self-pay | Admitting: Urology

## 2015-03-08 VITALS — BP 139/84 | HR 65 | Ht 69.0 in | Wt 182.9 lb

## 2015-03-08 DIAGNOSIS — R350 Frequency of micturition: Secondary | ICD-10-CM

## 2015-03-08 LAB — PTNS-PERCUTANEOUS TIBIAL NERVE STIMULATION: Scan Result: 12

## 2015-03-08 NOTE — Progress Notes (Signed)
Chief Complaint:  Chief Complaint  Patient presents with  . Urinary Frequency     PTNS     HPI: Patient is an 81 year old white male who presents today for PTNS treatment for his urinary frequency.   This is his sixth treatment and a series of 12.  Background history Mr. Can is an 81 year old white male who has a long-standing history of urinary frequency and dribbling.  He cannot recall a specific event when his urinary symptoms started.  He states he has a constant dribbling of a small amount of urine. This occurs all day.  He has not experienced an increase in the dribbling with coughing, sneezing, laughing, change in position or bending and lifting.      After his last treatment, he is making 10 (unchanged) trips to the bathroom during the daytime. He states he gets up 3 (unchanged) times nightly. He is not experiencing burning or pain with urination. He has a mild urgency to urinate. He is having one (improved) episodes of incontinence daily.   He does not consume any caffeine beverages, he has 1-2 glasses of wine daily and he drinks eight 8 ounce glasses of water daily.          Previous Therapy: He has been tried on tamsulosin and Toviaz without improvement in his urinary symptoms.      Blood pressure 139/84, pulse 65, height 5\' 9"  (1.753 m), weight 182 lb 14.4 oz (82.963 kg).  He does not have any contraindications present for PTNS, such as: Pacemaker, Implantable defibrillator, History of abnormal bleeding or History of neuropathies or nerve damage.Contraindications present for PTNS       Discussed with patient possible complications of procedure, such as discomfort, bleeding at insertion/stimulation site, procedure consent signed  Patient goals: The patient's goals were to reduce his urgency and his frequency.  PTNS treatment: The needle electrode was inserted into the lower, inner aspect of the patient's left leg. The surface electrode was placed on the inside arch of  the foot on the treatment leg. The lead set was connected to the stimulator, and the needle electrode clip was               connected to the needle electrode. The stimulator that produces an adjustable electrical pulse that travels to sacral nerve plexus via the tibial nerve was increased to 12 and a sensory response was observed.    Treatment Plan:  Patient tolerated the PTNS treatment well for 30 minutes. The electrode was removed with out difficulty. He will return in one weekfor his seventh PTNS treatment.          Patient has prostate cancer:   Patient underwent cryotherapy for a Gleason's 6 (10 cores + out of 12)-invasion present with a PSA of 4.3 and a 11% free PSA PCa in 2009.   PSA History:       0.4 ng/mL on 12/12/2010       0.4 ng/mL on 06/17/2011       0.5 ng/mL on 01/01/2012       0.5 ng/mL on 12/30/2013       0.6 ng/mL on 11/14/2014

## 2015-03-15 ENCOUNTER — Ambulatory Visit (INDEPENDENT_AMBULATORY_CARE_PROVIDER_SITE_OTHER): Payer: Medicare Other | Admitting: Family Medicine

## 2015-03-15 ENCOUNTER — Encounter: Payer: Self-pay | Admitting: Family Medicine

## 2015-03-15 ENCOUNTER — Encounter: Payer: Self-pay | Admitting: Urology

## 2015-03-15 ENCOUNTER — Ambulatory Visit (INDEPENDENT_AMBULATORY_CARE_PROVIDER_SITE_OTHER): Payer: Medicare Other | Admitting: Urology

## 2015-03-15 VITALS — BP 145/63 | HR 72 | Ht 69.0 in | Wt 181.1 lb

## 2015-03-15 VITALS — BP 112/60 | HR 74 | Temp 97.7°F | Resp 16 | Ht 69.0 in | Wt 182.0 lb

## 2015-03-15 DIAGNOSIS — R350 Frequency of micturition: Secondary | ICD-10-CM

## 2015-03-15 DIAGNOSIS — L989 Disorder of the skin and subcutaneous tissue, unspecified: Secondary | ICD-10-CM

## 2015-03-15 DIAGNOSIS — R7309 Other abnormal glucose: Secondary | ICD-10-CM

## 2015-03-15 DIAGNOSIS — I10 Essential (primary) hypertension: Secondary | ICD-10-CM

## 2015-03-15 DIAGNOSIS — R7303 Prediabetes: Secondary | ICD-10-CM | POA: Insufficient documentation

## 2015-03-15 LAB — PTNS-PERCUTANEOUS TIBIAL NERVE STIMULATION: Scan Result: 19

## 2015-03-15 LAB — POCT GLYCOSYLATED HEMOGLOBIN (HGB A1C): HEMOGLOBIN A1C: 5.6

## 2015-03-15 NOTE — Progress Notes (Signed)
Chief Complaint:  Chief Complaint  Patient presents with  . Urinary Frequency    PTNS     HPI: Patient is an 81 year old white male who presents today for PTNS treatment for his urinary frequency.   This is his seven treatment and a series of 12.  Background history Mr. Schwartz is an 81 year old white male who has a long-standing history of urinary frequency and dribbling.  He cannot recall a specific event when his urinary symptoms started.  He states he has a constant dribbling of a small amount of urine. This occurs all day.  He has not experienced an increase in the dribbling with coughing, sneezing, laughing, change in position or bending and lifting.      After his last treatment, he is making 9 (improved) trips to the bathroom during the daytime. He states he gets up 3 (unchanged) times nightly. He is not experiencing burning or pain with urination. He has a mild urgency to urinate. He is having one (unchanged) episodes of incontinence daily.   He does not consume any caffeine beverages, he has 1-2 glasses of wine daily and he drinks eight 8 ounce glasses of water daily.          Previous Therapy: He has been tried on tamsulosin and Toviaz without improvement in his urinary symptoms.     Blood pressure 145/63, pulse 72, height 5\' 9"  (1.753 m), weight 181 lb 1.6 oz (82.146 kg).  He does not have any contraindications present for PTNS, such as: Pacemaker, Implantable defibrillator, History of abnormal bleeding or History of neuropathies or nerve damage.Contraindications present for PTNS       Discussed with patient possible complications of procedure, such as discomfort, bleeding at insertion/stimulation site, procedure consent signed  Patient goals: The patient's goals were to reduce his urgency and his frequency.  PTNS treatment: The needle electrode was inserted into the lower, inner aspect of the patient's left leg. The surface electrode was placed on the inside arch of the  foot on the treatment leg. The lead set was connected to the stimulator, and the needle electrode clip was  connected to the needle electrode. The stimulator that produces an adjustable electrical pulse that travels to sacral nerve plexus via the tibial nerve was increased to 19 and a sensory response was observed.    Treatment Plan:  Patient tolerated the PTNS treatment well for 30 minutes. The electrode was removed with out difficulty. He will return in one weekfor his eight PTNS treatment.          Patient has prostate cancer:   Patient underwent cryotherapy for a Gleason's 6 (10 cores + out of 12)-invasion present with a PSA of 4.3 and a 11% free PSA PCa in 2009.   PSA History:       0.4 ng/mL on 12/12/2010       0.4 ng/mL on 06/17/2011       0.5 ng/mL on 01/01/2012       0.5 ng/mL on 12/30/2013       0.6 ng/mL on 11/14/2014

## 2015-03-15 NOTE — Progress Notes (Signed)
Patient ID: [name], male   DOB: [birth year], 81 y.o.   MRN: [id number]        Patient: [name] Male    DOB: 06-12-1925   81 y.o.   MRN: [id number] Visit Date: 03/15/2015  Today's Provider: Margarita Rana, MD   Chief Complaint  Patient presents with  . Diabetes  . Hypertension   Subjective:    Diabetes  Hypertension    Diabetes Mellitus Type II, Follow-up:   Lab Results  Component Value Date   HGBA1C 5.8 09/07/2014    Last seen for diabetes 6 months ago.  Management since then includes none. He reports excellent compliance with treatment. He is not having side effects.  Current symptoms include none and have been stable. Home blood sugar records: not being checked  Episodes of hypoglycemia? no   Current Insulin Regimen: none Most Recent Eye Exam: 02/2015 ([facility]) Weight trend: stable Prior visit with dietician: no Current diet: in general, a "healthy" diet   Current exercise: walking  Pertinent Labs:    Component Value Date/Time   CREATININE 0.72* 01/06/2015 1013   CREATININE 1.00 04/25/2012 1258    Wt Readings from Last 3 Encounters:  03/15/15 182 lb (82.555 kg)  03/15/15 181 lb 1.6 oz (82.146 kg)  03/08/15 182 lb 14.4 oz (82.963 kg)      Hypertension, follow-up:  BP Readings from Last 3 Encounters:  03/15/15 112/60  03/15/15 145/63  03/08/15 139/84    He was last seen for hypertension 4 months ago.  BP at that visit was 122/80. Management changes since that visit include none. He reports excellent compliance with treatment. He is having side effects. none  He is exercising. He is adherent to low salt diet.   Outside blood pressures are not being checked. He is experiencing none.  Patient denies chest pain.   Cardiovascular risk factors include none.      Weight trend: stable Wt Readings from Last 3 Encounters:  03/15/15 182 lb (82.555 kg)  03/15/15 181 lb 1.6 oz (82.146 kg)  03/08/15 182 lb 14.4 oz (82.963 kg)     Current diet: in general, a "healthy" diet      Having some leg swelling and is improved with compression hose.  Does not want medication change at this time.           Allergies  Allergen Reactions  . Toviaz [Fesoterodine Fumarate Er] Other (See Comments)    Acid reflux   Previous Medications   AMLODIPINE (NORVASC) 2.5 MG TABLET    Take 2.5 mg by mouth daily.    ASPIRIN 81 MG TABLET    Take 81 mg by mouth daily.   HYDROCHLOROTHIAZIDE (MICROZIDE) 12.5 MG CAPSULE    Take 25 mg by mouth every morning.   LOSARTAN (COZAAR) 25 MG TABLET    Take 25 mg by mouth daily.    MULTIPLE VITAMIN (MULTIVITAMIN) CAPSULE    Take 1 capsule by mouth daily.   OVER THE COUNTER MEDICATION    Take by mouth 1 day or 1 dose. ESTER C   TAMSULOSIN (FLOMAX) 0.4 MG CAPS CAPSULE    Take 1 capsule (0.4 mg total) by mouth daily.   VITAMIN B-12 (CYANOCOBALAMIN) 1000 MCG TABLET    Take 1,000 mcg by mouth daily.    Review of Systems  Constitutional: Negative.   HENT: Negative.   Respiratory: Negative.   Cardiovascular: Leg swelling: wearing hose and it helps.   Endocrine: Negative.   Psychiatric/Behavioral: Negative.  Social History  Substance Use Topics  . Smoking status: Former Smoker -- 10 years    Quit date: 07/08/1962  . Smokeless tobacco: Never Used     Comment: quit 1964  . Alcohol Use: 3.0 oz/week    0 Standard drinks or equivalent, 5 Glasses of wine per week     Comment: beer occasionally   Objective:   BP 112/60 mmHg  Pulse 74  Temp(Src) 97.7 F (36.5 C) (Oral)  Resp 16  Ht 5\' 9"  (1.753 m)  Wt 182 lb (82.555 kg)  BMI 26.86 kg/m2  Physical Exam  Constitutional: He is oriented to person, place, and time. He appears well-developed and well-nourished.  Cardiovascular: Normal rate and regular rhythm.   Pulmonary/Chest: Effort normal and breath sounds normal.  Neurological: He is alert and oriented to person, place, and time.  Psychiatric: He has a normal mood and affect. His  behavior is normal. Thought content normal.   The patient has a history of falls. I did complete a risk assessment for falls. A plan of care for falls was documented. Depression screen PHQ 2/9 03/15/2015  Decreased Interest 0  Down, Depressed, Hopeless 1  PHQ - 2 Score 1        Assessment & Plan:     1. Abnormal glucose Stable. Recheck in 6 months.  - POCT glycosylated hemoglobin (Hb A1C) Results for orders placed or performed in visit on 03/15/15  Hemoglobin A1c  Result Value Ref Range   Hgb A1c MFr Bld 5.8 4.0 - 6.0 %  POCT glycosylated hemoglobin (Hb A1C)  Result Value Ref Range   Hemoglobin A1C 5.6     2. Essential hypertension Stable. Continue current medication.   3. Skin lesions Has not seen Dermatology.  Will refer. Has lesions on his ears and arms that he wants checked.  - Ambulatory referral to Dermatology     Patient was seen and examined by Jerrell Belfast, MD, and note scribed, in part,  by Lynford Humphrey, [place].    I have reviewed the document for accuracy and completeness and I agree with above. - Jerrell Belfast, MD   Margarita Rana, MD  [facility]

## 2015-03-16 ENCOUNTER — Ambulatory Visit: Payer: Medicare Other | Admitting: Urology

## 2015-03-22 ENCOUNTER — Ambulatory Visit: Payer: Medicare Other | Admitting: Urology

## 2015-03-22 ENCOUNTER — Ambulatory Visit (INDEPENDENT_AMBULATORY_CARE_PROVIDER_SITE_OTHER): Payer: Medicare Other | Admitting: Urology

## 2015-03-22 ENCOUNTER — Encounter: Payer: Self-pay | Admitting: Urology

## 2015-03-22 VITALS — BP 138/60 | HR 78 | Ht 69.14 in | Wt 181.7 lb

## 2015-03-22 DIAGNOSIS — R350 Frequency of micturition: Secondary | ICD-10-CM | POA: Diagnosis not present

## 2015-03-22 LAB — PTNS-PERCUTANEOUS TIBIAL NERVE STIMULATION: Scan Result: 19

## 2015-03-22 NOTE — Progress Notes (Signed)
Chief Complaint:  Chief Complaint  Patient presents with  . Urinary Frequency    PTNS     HPI: Patient is an 81 year old white male who presents today for PTNS treatment for his urinary frequency.   This is # 8 treatment and a series of 12.  Background history [name] is an 81 year old white male who has a long-standing history of urinary frequency and dribbling.  He cannot recall a specific event when his urinary symptoms started.  He states he has a constant dribbling of a small amount of urine. This occurs all day.  He has not experienced an increase in the dribbling with coughing, sneezing, laughing, change in position or bending and lifting.      After his seventh treatment, he is making 9 (improved) trips to the bathroom during the daytime. He states he gets up 3 (unchanged) times nightly. He is not experiencing burning or pain with urination. He has a mild urgency to urinate. He is having one (unchanged) episodes of incontinence daily.   He does not consume any caffeine beverages, he has 1-2 glasses of wine daily and he drinks eight 8 ounce glasses of water daily.          Previous Therapy: He has been tried on tamsulosin and Toviaz without improvement in his urinary symptoms.     Blood pressure 145/63, pulse 72, height 5\' 9"  (1.753 m), weight 181 lb 1.6 oz (82.146 kg).  He does not have any contraindications present for PTNS, such as: Pacemaker, Implantable defibrillator, History of abnormal bleeding or History of neuropathies or nerve damage.Contraindications present for PTNS       Discussed with patient possible complications of procedure, such as discomfort, bleeding at insertion/stimulation site, procedure consent signed  Patient goals: The patient's goals were to reduce his urgency and his frequency.  PTNS treatment: The needle electrode was inserted into the lower, inner aspect of the patient's left leg. The surface electrode was placed on the inside arch of the foot  on the treatment leg. The lead set was connected to the stimulator, and the needle electrode clip was  connected to the needle electrode. The stimulator that produces an adjustable electrical pulse that travels to sacral nerve plexus via the tibial nerve was increased to 19 and a sensory response was observed.    Treatment Plan:  Patient tolerated the PTNS treatment well for 30 minutes. The electrode was removed with out difficulty. He will return in one weekfor his ninth PTNS treatment.          Patient has prostate cancer:   Patient underwent cryotherapy for a Gleason's 6 (10 cores + out of 12)-invasion present with a PSA of 4.3 and a 11% free PSA PCa in 2009.   PSA History:       0.4 ng/mL on 12/12/2010       0.4 ng/mL on 06/17/2011       0.5 ng/mL on 01/01/2012       0.5 ng/mL on 12/30/2013       0.6 ng/mL on 11/14/2014

## 2015-03-29 ENCOUNTER — Encounter: Payer: Self-pay | Admitting: Urology

## 2015-03-29 ENCOUNTER — Ambulatory Visit (INDEPENDENT_AMBULATORY_CARE_PROVIDER_SITE_OTHER): Payer: Medicare Other | Admitting: Urology

## 2015-03-29 VITALS — BP 145/80 | HR 66 | Ht 69.0 in | Wt 183.6 lb

## 2015-03-29 DIAGNOSIS — R350 Frequency of micturition: Secondary | ICD-10-CM

## 2015-03-29 LAB — PTNS-PERCUTANEOUS TIBIAL NERVE STIMULATION: Scan Result: 3

## 2015-03-29 NOTE — Progress Notes (Signed)
Chief Complaint:  Chief Complaint  Patient presents with  . Urinary Frequency    PTNS     HPI: Patient is an 81 year old white male who presents today for PTNS treatment for his urinary frequency.   This is # 9 treatment and a series of 12.  Background history [name] is an 81 year old white male who has a long-standing history of urinary frequency and dribbling.  He cannot recall a specific event when his urinary symptoms started.  He states he has a constant dribbling of a small amount of urine. This occurs all day.  He has not experienced an increase in the dribbling with coughing, sneezing, laughing, change in position or bending and lifting.      After his eighth treatment, he is making 9 (unchanged) trips to the bathroom during the daytime. He states he gets up 3 (unchanged) times nightly. He is not experiencing burning or pain with urination. He has a mild urgency to urinate. He is having one (unchanged) episodes of incontinence daily.   He does not consume any caffeine beverages, he has 1-2 glasses of wine daily and he drinks eight 8 ounce glasses of water daily.          Previous Therapy: He has been tried on tamsulosin and Toviaz without improvement in his urinary symptoms.     Blood pressure 145/63, pulse 72, height 5\' 9"  (1.753 m), weight 181 lb 1.6 oz (82.146 kg).  He does not have any contraindications present for PTNS, such as: Pacemaker, Implantable defibrillator, History of abnormal bleeding or History of neuropathies or nerve damage.Contraindications present for PTNS       Discussed with patient possible complications of procedure, such as discomfort, bleeding at insertion/stimulation site, procedure consent signed  Patient goals: The patient's goals were to reduce his urgency and his frequency.  PTNS treatment: The needle electrode was inserted into the lower, inner aspect of the patient's left leg. The surface electrode was placed on the inside arch of the foot  on the treatment leg. The lead set was connected to the stimulator, and the needle electrode clip was  connected to the needle electrode. The stimulator that produces an adjustable electrical pulse that travels to sacral nerve plexus via the tibial nerve was increased to 3 and a sensory response was observed.    Treatment Plan:  Patient tolerated the PTNS treatment well for 30 minutes. The electrode was removed with out difficulty. He will return in one weekfor his tenth PTNS treatment.          Patient has prostate cancer:   Patient underwent cryotherapy for a Gleason's 6 (10 cores + out of 12)-invasion present with a PSA of 4.3 and a 11% free PSA PCa in 2009.   PSA History:       0.4 ng/mL on 12/12/2010       0.4 ng/mL on 06/17/2011       0.5 ng/mL on 01/01/2012       0.5 ng/mL on 12/30/2013       0.6 ng/mL on 11/14/2014

## 2015-04-05 ENCOUNTER — Ambulatory Visit (INDEPENDENT_AMBULATORY_CARE_PROVIDER_SITE_OTHER): Payer: Medicare Other | Admitting: Urology

## 2015-04-05 ENCOUNTER — Encounter: Payer: Self-pay | Admitting: Urology

## 2015-04-05 VITALS — BP 139/68 | HR 71 | Ht 69.25 in | Wt 183.2 lb

## 2015-04-05 DIAGNOSIS — R350 Frequency of micturition: Secondary | ICD-10-CM

## 2015-04-05 LAB — PTNS-PERCUTANEOUS TIBIAL NERVE STIMULATION: Scan Result: 19

## 2015-04-05 NOTE — Progress Notes (Signed)
Chief Complaint:  Chief Complaint  Patient presents with  . Urinary Urgency    PTNS     HPI: Patient is an 81 year old white male who presents today for PTNS treatment for his urinary frequency.   This is # 10 treatment and a series of 12.  Background history Mr. [place] is an 81 year old white male who has a long-standing history of urinary frequency and dribbling.  He cannot recall a specific event when his urinary symptoms started.  He states he has a constant dribbling of a small amount of urine. This occurs all day.  He has not experienced an increase in the dribbling with coughing, sneezing, laughing, change in position or bending and lifting.      After his ninth treatment, he is making 9 (unchanged) trips to the bathroom during the daytime. He states he gets up 3 (unchanged) times nightly. He is not experiencing burning or pain with urination. He has a mild urgency to urinate. He is having one (unchanged) episodes of incontinence daily.   He does not consume any caffeine beverages, he has 1-2 glasses of wine daily and he drinks eight 8 ounce glasses of water daily.          Previous Therapy: He has been tried on tamsulosin and Toviaz without improvement in his urinary symptoms.     Blood pressure 139/68, pulse 71, height 5' 9.25" (1.759 m), weight 183 lb 3.2 oz (83.099 kg).  He does not have any contraindications present for PTNS, such as: Pacemaker, Implantable defibrillator, History of abnormal bleeding or History of neuropathies or nerve damage.Contraindications present for PTNS       Discussed with patient possible complications of procedure, such as discomfort, bleeding at insertion/stimulation site, procedure consent signed  Patient goals: The patient's goals were to reduce his urgency and his frequency.  PTNS treatment: The needle electrode was inserted into the lower, inner aspect of the patient's left leg. The surface electrode was placed on the inside arch of the  foot on the treatment leg. The lead set was connected to the stimulator, and the needle electrode clip was  connected to the needle electrode. The stimulator that produces an adjustable electrical pulse that travels to sacral nerve plexus via the tibial nerve was increased to 19 and a sensory response was observed.    Treatment Plan:  Patient tolerated the PTNS treatment well for 30 minutes. The electrode was removed with out difficulty. He will return in one weekfor his eleventh PTNS treatment.          Patient has prostate cancer:   Patient underwent cryotherapy for a Gleason's 6 (10 cores + out of 12)-invasion present with a PSA of 4.3 and a 11% free PSA PCa in 2009.   PSA History:       0.4 ng/mL on 12/12/2010       0.4 ng/mL on 06/17/2011       0.5 ng/mL on 01/01/2012       0.5 ng/mL on 12/30/2013       0.6 ng/mL on 11/14/2014

## 2015-04-10 ENCOUNTER — Ambulatory Visit
Admission: RE | Admit: 2015-04-10 | Discharge: 2015-04-10 | Disposition: A | Discharge status: Home / Self Care | Payer: Medicare Other | Source: Ambulatory Visit | Attending: Family Medicine | Admitting: Family Medicine

## 2015-04-10 ENCOUNTER — Ambulatory Visit (INDEPENDENT_AMBULATORY_CARE_PROVIDER_SITE_OTHER): Payer: Medicare Other | Admitting: Family Medicine

## 2015-04-10 ENCOUNTER — Encounter: Payer: Self-pay | Admitting: Family Medicine

## 2015-04-10 VITALS — BP 118/68 | HR 60 | Temp 97.4°F | Resp 16 | Wt 182.0 lb

## 2015-04-10 DIAGNOSIS — M2011 Hallux valgus (acquired), right foot: Secondary | ICD-10-CM | POA: Insufficient documentation

## 2015-04-10 DIAGNOSIS — M79671 Pain in right foot: Secondary | ICD-10-CM

## 2015-04-10 DIAGNOSIS — M19071 Primary osteoarthritis, right ankle and foot: Secondary | ICD-10-CM | POA: Insufficient documentation

## 2015-04-10 DIAGNOSIS — M79674 Pain in right toe(s): Secondary | ICD-10-CM | POA: Diagnosis not present

## 2015-04-10 DIAGNOSIS — S9032XA Contusion of left foot, initial encounter: Secondary | ICD-10-CM | POA: Diagnosis not present

## 2015-04-10 NOTE — Progress Notes (Signed)
Patient ID: [name], male   DOB: 12-28-1924, 81 y.o.   MRN: [id number]         Patient: [name] Male    DOB: September 27, 1924   81 y.o.   MRN: [id number] Visit Date: 04/10/2015  Today's Provider: Margarita Rana, MD   Chief Complaint  Patient presents with  . Toe Pain   Subjective:    Toe Pain  The incident occurred 2 days ago (About 2-3 weeks.). There was no injury mechanism (Not sure if he hurt it putting his socks on. ). The pain is present in the right toes. The quality of the pain is described as aching (and warm to touch.). The pain is moderate. The pain has been constant since onset. Pertinent negatives include no inability to bear weight. He reports no foreign bodies present. The symptoms are aggravated by palpation. He has tried acetaminophen for the symptoms. The treatment provided moderate relief.   Started on 5th digit. Only one that is sore. Discoloration moved to other toes. Swelling has been for awhile.  Patient thinks swelling about the same and wife thinks it is more.   Did have family in town and was walking more.      Allergies  Allergen Reactions  . Toviaz [Fesoterodine Fumarate Er] Other (See Comments)    Acid reflux   Previous Medications   AMLODIPINE (NORVASC) 2.5 MG TABLET    Take 2.5 mg by mouth daily.    ASPIRIN 81 MG TABLET    Take 81 mg by mouth daily.   HYDROCHLOROTHIAZIDE (MICROZIDE) 12.5 MG CAPSULE    Take 25 mg by mouth every morning.   LOSARTAN (COZAAR) 25 MG TABLET    Take 25 mg by mouth daily.    MULTIPLE VITAMIN (MULTIVITAMIN) CAPSULE    Take 1 capsule by mouth daily.   OVER THE COUNTER MEDICATION    Take by mouth 1 day or 1 dose. ESTER C   TAMSULOSIN (FLOMAX) 0.4 MG CAPS CAPSULE    Take 1 capsule (0.4 mg total) by mouth daily.   VITAMIN B-12 (CYANOCOBALAMIN) 1000 MCG TABLET    Take 1,000 mcg by mouth daily.    Review of Systems  Constitutional: Negative.   Cardiovascular: Positive for leg swelling (Right foot swelling.).    Musculoskeletal: Positive for arthralgias and gait problem. Negative for myalgias, back pain, joint swelling, neck pain and neck stiffness.    Social History  Substance Use Topics  . Smoking status: Former Smoker -- 10 years    Quit date: 07/08/1962  . Smokeless tobacco: Never Used     Comment: quit 1964  . Alcohol Use: 3.0 oz/week    0 Standard drinks or equivalent, 5 Glasses of wine per week     Comment: beer occasionally   Objective:   BP 118/68 mmHg  Pulse 60  Temp(Src) 97.4 F (36.3 C) (Oral)  Resp 16  Wt 182 lb (82.555 kg)  Physical Exam  Constitutional: He is oriented to person, place, and time. He appears well-developed and well-nourished.  Musculoskeletal: He exhibits edema and tenderness.  5 th toe of right foot, with tenderness, decreased ROM with some bruising noted at base of that toe and adjacent toes at MTP joints.  Other toes not tender. Foot warm, good pulses.   Neurological: He is alert and oriented to person, place, and time.      Assessment & Plan:     1. Right foot pain New problem . Suspect fracture.  Will check Xray.  If has fracture, will need to consider osteoporosis work up.  - DG Foot 2 Views Right; Future  2. Pain of toe of right foot As above.   - DG Foot 2 Views Right; Future       Margarita Rana, MD  [facility]

## 2015-04-11 ENCOUNTER — Telehealth: Payer: Self-pay

## 2015-04-11 NOTE — Telephone Encounter (Signed)
Tylenol is fine. Not sure if brace would help. Ok if it makes it feel better, but so distal, not sure if would help. Thanks.

## 2015-04-11 NOTE — Telephone Encounter (Signed)
Advised patient of results. Patient would like to know what kind of brace would be appropriate for his foot? He would also like to know if he can take Tylenol along with prescribed medications? He says that he his feeling a little better, but he feels that he may need an occasional Tylenol to help with the pain. Please advise. Thanks!

## 2015-04-11 NOTE — Telephone Encounter (Signed)
LMTCB 04/11/2015   Thanks,   -Mickel Baas

## 2015-04-11 NOTE — Telephone Encounter (Signed)
-----   Message from Margarita Rana, MD sent at 04/10/2015  7:27 PM EDT ----- No fracture.  Please see if  Improving. If not, may need podiatry referral. Thanks.

## 2015-04-12 ENCOUNTER — Ambulatory Visit (INDEPENDENT_AMBULATORY_CARE_PROVIDER_SITE_OTHER): Payer: Medicare Other | Admitting: Urology

## 2015-04-12 ENCOUNTER — Encounter: Payer: Self-pay | Admitting: Urology

## 2015-04-12 VITALS — BP 126/69 | HR 67 | Ht 69.0 in | Wt 182.0 lb

## 2015-04-12 DIAGNOSIS — R350 Frequency of micturition: Secondary | ICD-10-CM | POA: Diagnosis not present

## 2015-04-12 LAB — PTNS-PERCUTANEOUS TIBIAL NERVE STIMULATION: Scan Result: 14

## 2015-04-12 NOTE — Progress Notes (Signed)
Chief Complaint:  Chief Complaint  Patient presents with  . Urinary Frequency    PTNS     HPI: Patient is an 81 year old white male who presents today for PTNS treatment for his urinary frequency.   This is # 11 treatment and a series of 12.  Background history [name] is an 81 year old white male who has a long-standing history of urinary frequency and dribbling.  He cannot recall a specific event when his urinary symptoms started.  He states he has a constant dribbling of a small amount of urine. This occurs all day.  He has not experienced an increase in the dribbling with coughing, sneezing, laughing, change in position or bending and lifting.      After his eleventh treatment, he is making 8 (improvement) trips to the bathroom during the daytime. He states he gets up 0-1 (improvement) times nightly. He is not experiencing burning or pain with urination. He has a mild urgency to urinate. He is having one (unchanged) episodes of incontinence daily.   He does not consume any caffeine beverages, he has 1-2 glasses of wine daily and he drinks eight 8 ounce glasses of water daily.          Previous Therapy: He has been tried on tamsulosin and Toviaz without improvement in his urinary symptoms.     Blood pressure 126/69, pulse 67, height 5\' 9"  (1.753 m), weight 182 lb (82.555 kg).  He does not have any contraindications present for PTNS, such as: Pacemaker, Implantable defibrillator, History of abnormal bleeding or History of neuropathies or nerve damage.Contraindications present for PTNS       Discussed with patient possible complications of procedure, such as discomfort, bleeding at insertion/stimulation site, procedure consent signed  Patient goals: The patient's goals were to reduce his urgency and his frequency.  PTNS treatment: The needle electrode was inserted into the lower, inner aspect of the patient's left leg. The surface electrode was placed on the inside arch of the  foot on the treatment leg. The lead set was connected to the stimulator, and the needle electrode clip was  connected to the needle electrode. The stimulator that produces an adjustable electrical pulse that travels to sacral nerve plexus via the tibial nerve was increased to 14 and a sensory response was observed.    Treatment Plan:  Patient tolerated the PTNS treatment well for 30 minutes. The electrode was removed with out difficulty. He will return in one weekfor his twelve  PTNS treatment.          Patient has prostate cancer:   Patient underwent cryotherapy for a Gleason's 6 (10 cores + out of 12)-invasion present with a PSA of 4.3 and a 11% free PSA PCa in 2009.   PSA History:       0.4 ng/mL on 12/12/2010       0.4 ng/mL on 06/17/2011       0.5 ng/mL on 01/01/2012       0.5 ng/mL on 12/30/2013       0.6 ng/mL on 11/14/2014

## 2015-04-13 NOTE — Telephone Encounter (Signed)
Patient advised as below. sd 

## 2015-04-19 ENCOUNTER — Encounter: Payer: Self-pay | Admitting: Obstetrics and Gynecology

## 2015-04-19 ENCOUNTER — Ambulatory Visit (INDEPENDENT_AMBULATORY_CARE_PROVIDER_SITE_OTHER): Payer: Medicare Other | Admitting: Obstetrics and Gynecology

## 2015-04-19 VITALS — BP 169/81 | HR 54 | Resp 18 | Ht 69.0 in | Wt 180.6 lb

## 2015-04-19 DIAGNOSIS — R350 Frequency of micturition: Secondary | ICD-10-CM

## 2015-04-19 LAB — PTNS-PERCUTANEOUS TIBIAL NERVE STIMULATION: SCAN RESULT: 4

## 2015-04-19 NOTE — Progress Notes (Signed)
HPI: Patient is an 81 year old white male who presents today for PTNS treatment for his urinary frequency. This is his last treatment in a series of 12.  Background history [name] is an 81 year old white male who has a long-standing history of urinary frequency and dribbling. He cannot recall a specific event when his urinary symptoms started. He states he has a constant dribbling of a small amount of urine. This occurs all day. He has not experienced an increase in the dribbling with coughing, sneezing, laughing, change in position or bending and lifting.   After his eleventh treatment, he is making 6 (improvement) trips to the bathroom during the daytime. He states he gets up 0-1 (improvement) times nightly. He is not experiencing burning or pain with urination. He has a mild urgency to urinate. He is having 0 (improved) episodes of incontinence daily.   He does not consume any caffeine beverages, he has 1-2 glasses of wine daily and he drinks eight 8 ounce glasses of water daily.    Previous Therapy: He has been tried on tamsulosin and Toviaz without improvement in his urinary symptoms.    BP 169/81 mmHg  Pulse 54  Resp 18  Ht 5\' 9"  (1.753 m)  Wt 180 lb 9.6 oz (81.92 kg)  BMI 26.66 kg/m2  He does not have any contraindications present for PTNS, such as: Pacemaker, Implantable defibrillator, History of abnormal bleeding or History of neuropathies or nerve damage.Contraindications present for PTNS   Discussed with patient possible complications of procedure, such as discomfort, bleeding at insertion/stimulation site, procedure consent signed  Patient goals: The patient's goals were to reduce his urgency and his frequency.  PTNS treatment: The needle electrode was inserted into the lower, inner aspect of the patient's left leg. The surface electrode was placed on the inside arch of the foot on the treatment leg. The lead set was connected to the stimulator,  and the needle electrode clip was connected to the needle electrode. The stimulator that produces an adjustable electrical pulse that travels to sacral nerve plexus via the tibial nerve was increased to 14 and a sensory response was observed.   Treatment Plan:  Patient tolerated the PTNS treatment well for 30 minutes. The electrode was removed with out difficulty. He will return in one weekfor his twelve PTNS treatment.      Patient has prostate cancer: Patient underwent cryotherapy for a Gleason's 6 (10 cores + out of 12)-invasion present with a PSA of 4.3 and a 11% free PSA PCa in 2009.  PSA History:  0.4 ng/mL on 12/12/2010  0.4 ng/mL on 06/17/2011  0.5 ng/mL on 01/01/2012  0.5 ng/mL on 12/30/2013  0.6 ng/mL on 11/14/2014

## 2015-05-04 DIAGNOSIS — Z23 Encounter for immunization: Secondary | ICD-10-CM | POA: Diagnosis not present

## 2015-05-24 ENCOUNTER — Encounter: Payer: Self-pay | Admitting: Urology

## 2015-05-24 ENCOUNTER — Ambulatory Visit (INDEPENDENT_AMBULATORY_CARE_PROVIDER_SITE_OTHER): Payer: Medicare Other | Admitting: Urology

## 2015-05-24 VITALS — BP 130/73 | HR 63 | Ht 69.0 in | Wt 181.4 lb

## 2015-05-24 DIAGNOSIS — R350 Frequency of micturition: Secondary | ICD-10-CM | POA: Diagnosis not present

## 2015-05-24 LAB — PTNS-PERCUTANEOUS TIBIAL NERVE STIMULATION: Scan Result: 19

## 2015-05-25 ENCOUNTER — Telehealth: Payer: Self-pay | Admitting: Urology

## 2015-05-25 NOTE — Progress Notes (Signed)
Chief Complaint:  Chief Complaint  Patient presents with  . Urinary Frequency    PTNS Maint #1     HPI: Patient is an 81 year old white male who presents today for PTNS treatment for his urinary frequency.   This will be a maintenance treatment.    Background history [name] is an 81 year old white male who has a long-standing history of urinary frequency and dribbling.  He cannot recall a specific event when his urinary symptoms started.  He states he has a constant dribbling of a small amount of urine. This occurs all day.  He has not experienced an increase in the dribbling with coughing, sneezing, laughing, change in position or bending and lifting.      After his last treatment, he is making 6 (improvement) trips to the bathroom during the daytime. He states he gets up 3 (worsening) times nightly. He is not experiencing burning or pain with urination. He has a mild urgency to urinate. He is having no (improvement) episodes of incontinence daily.   He does not consume any caffeine beverages, he has 1-2 glasses of wine daily and he drinks eight 8 ounce glasses of water daily.          Previous Therapy: He has been tried on tamsulosin and Toviaz without improvement in his urinary symptoms.     Blood pressure 130/73, pulse 63, height 5\' 9"  (1.753 m), weight 181 lb 6.4 oz (82.283 kg).  He does not have any contraindications present for PTNS, such as: Pacemaker, Implantable defibrillator, History of abnormal bleeding or History of neuropathies or nerve damage.Contraindications present for PTNS       Discussed with patient possible complications of procedure, such as discomfort, bleeding at insertion/stimulation site, procedure consent signed  Patient goals: The patient's goals were to reduce his urgency and his frequency.  PTNS treatment: The needle electrode was inserted into the lower, inner aspect of the patient's left leg. The surface electrode was placed on the inside arch of  the foot on the treatment leg. The lead set was connected to the stimulator, and the needle electrode clip was  connected to the needle electrode. The stimulator that produces an adjustable electrical pulse that travels to sacral nerve plexus via the tibial nerve was increased to 19 and a sensory response was observed.    Treatment Plan:  Patient tolerated the PTNS treatment well for 30 minutes. The electrode was removed with out difficulty. He will return in one monthfor his maintanance PTNS treatment.          Patient has prostate cancer:   Patient underwent cryotherapy for a Gleason's 6 (10 cores + out of 12)-invasion present with a PSA of 4.3 and a 11% free PSA PCa in 2009.   PSA History:       0.4 ng/mL on 12/12/2010       0.4 ng/mL on 06/17/2011       0.5 ng/mL on 01/01/2012       0.5 ng/mL on 12/30/2013       0.6 ng/mL on 11/14/2014

## 2015-05-25 NOTE — Telephone Encounter (Signed)
Patient will need an office visit separate from his PTNS for his PSA and his exam for his prostate cancer with either me or Ria Comment.

## 2015-05-25 NOTE — Telephone Encounter (Signed)
I called and lm for Jase to CB to make an appt for an appt with shannon or lindsay to have his psa checked w/dre. Just their next available  Thanks, Sharyn Lull

## 2015-05-31 DIAGNOSIS — L853 Xerosis cutis: Secondary | ICD-10-CM | POA: Diagnosis not present

## 2015-05-31 DIAGNOSIS — L728 Other follicular cysts of the skin and subcutaneous tissue: Secondary | ICD-10-CM | POA: Diagnosis not present

## 2015-05-31 DIAGNOSIS — L821 Other seborrheic keratosis: Secondary | ICD-10-CM | POA: Diagnosis not present

## 2015-06-08 ENCOUNTER — Ambulatory Visit (INDEPENDENT_AMBULATORY_CARE_PROVIDER_SITE_OTHER): Payer: Medicare Other | Admitting: Urology

## 2015-06-08 ENCOUNTER — Encounter: Payer: Self-pay | Admitting: Urology

## 2015-06-08 VITALS — BP 134/61 | HR 71 | Ht 69.25 in | Wt 183.1 lb

## 2015-06-08 DIAGNOSIS — Z8546 Personal history of malignant neoplasm of prostate: Secondary | ICD-10-CM | POA: Diagnosis not present

## 2015-06-08 DIAGNOSIS — C61 Malignant neoplasm of prostate: Secondary | ICD-10-CM | POA: Diagnosis not present

## 2015-06-08 DIAGNOSIS — R350 Frequency of micturition: Secondary | ICD-10-CM | POA: Diagnosis not present

## 2015-06-08 NOTE — Progress Notes (Signed)
10:34 AM   [name] February 26, 1925 [id number]  Referring provider: Margarita Rana, MD [address] [place], [place] [id number]  Chief Complaint  Patient presents with  . Prostate Cancer    check up    HPI: Patient is an 81 year old white with prostate cancer who underwent cryotherapy for a Gleason's 6 (10 cores + out of 12)-invasion present with a PSA of 4.3 and a 11% free PSA PCa in 2009 with [name] presents today for a 6 month follow up.  He has not had any weight loss, bone pain or appetite loss.  He generally feels well.  He has not had any fevers, chills, nauseas or vomiting.  He has not had any dysuria, gross hematuria or suprapubic pain.    His baseline urinary symptoms consist of nocturia and leakage of urine.  He is currently receiving PTNS treatment for his urinary symptoms.  He is scheduled for his next PTNS in two weeks.    PMH: Past Medical History  Diagnosis Date  . Shingles 2014  . Stroke ([place]) 2012  . Hard of hearing   . Sinus problem   . Osteoporosis   . HBP (high blood pressure)   . Erectile dysfunction   . Nocturia   . Arthritis   . HLD (hyperlipidemia)   . Urinary frequency   . Urinary leakage   . GERD (gastroesophageal reflux disease)   . Bladder outlet obstruction   . History of nephrolithiasis   . Stomach ulcer   . Incomplete bladder emptying   . Prostate cancer [facility])     Surgical History: Past Surgical History  Procedure Laterality Date  . Cholecystectomy  1972  . Cataract extraction  2000, 2001  . Prostate surgery  2008/2009  . Tonsillectomy    . Umbilical hernia repair      Home Medications:    Medication List       This list is accurate as of: 06/08/15 10:34 AM.  Always use your most recent med list.               amLODipine 2.5 MG tablet  Commonly known as:  NORVASC  Take 2.5 mg by mouth daily.     aspirin 81 MG tablet  Take 81 mg by mouth daily.     hydrochlorothiazide 12.5 MG capsule  Commonly  known as:  MICROZIDE  Take 25 mg by mouth every morning.     losartan 25 MG tablet  Commonly known as:  COZAAR  Take 25 mg by mouth daily.     multivitamin capsule  Take 1 capsule by mouth daily.     OVER THE COUNTER MEDICATION  Take by mouth 1 day or 1 dose. ESTER C     tamsulosin 0.4 MG Caps capsule  Commonly known as:  FLOMAX  Take 1 capsule (0.4 mg total) by mouth daily.     vitamin B-12 1000 MCG tablet  Commonly known as:  CYANOCOBALAMIN  Take 1,000 mcg by mouth daily.        Allergies:  Allergies  Allergen Reactions  . Toviaz [Fesoterodine Fumarate Er] Other (See Comments)    Acid reflux    Family History: Family History  Problem Relation Age of Onset  . Cancer Mother   . Stroke Father   . Stroke Sister   . Prostate cancer Brother   . Kidney disease Neg Hx     Social History:  reports that he quit smoking about 82  years ago. He has never used smokeless tobacco. He reports that he drinks about 3.0 oz of alcohol per week. He reports that he does not use illicit drugs.  ROS: Urological Symptom Review  Patient is experiencing the following symptoms: Frequent urination Get up at night to urinate Leakage of urine   Review of Systems  Gastrointestinal (upper)  : Negative for upper GI symptoms  Gastrointestinal (lower) : Negative for lower GI symptoms  Constitutional : Negative for symptoms  Skin: Negative for skin symptoms  Eyes: Negative for eye symptoms  Ear/Nose/Throat : Negative for Ear/Nose/Throat symptoms  Hematologic/Lymphatic: Negative for Hematologic/Lymphatic symptoms  Cardiovascular : Negative for cardiovascular symptoms  Respiratory : Negative for respiratory symptoms  Endocrine: Negative for endocrine symptoms  Musculoskeletal: Negative for musculoskeletal symptoms  Neurological: Negative for neurological symptoms  Psychologic: Negative for psychiatric symptoms   Physical Exam: Blood pressure 134/61, pulse 71,  height 5' 9.25" (1.759 m), weight 183 lb 1.6 oz (83.054 kg). GU: No CVA tenderness.  No bladder fullness or masses.  Patient with circumcised phallus.  Urethral meatus is patent.  No penile discharge. No penile lesions or rashes. Scrotum without lesions, cysts, rashes and/or edema.  Testicles are located scrotally bilaterally. No masses are appreciated in the testicles. Left and right epididymis are normal. Rectal: Patient with  normal sphincter tone. Anus and perineum without scarring or rashes. No rectal masses are appreciated. Prostate is approximately 25 grams, no nodules are appreciated. Seminal vesicles are normal.    Laboratory Data:  Lab Results  Component Value Date   WBC 4.0 01/06/2015   HGB 14.6 04/25/2012   HCT 42.2 01/06/2015   MCV 92 04/25/2012   PLT 189 04/25/2012    Lab Results  Component Value Date   CREATININE 0.72* 01/06/2015  PSA History:       0.4 ng/mL on 12/12/2010       0.4 ng/mL on 06/17/2011       0.5 ng/mL on 01/01/2012       0.5 ng/mL on 12/30/2013        0.6 ng/mL on 11/14/2014   Lab Results  Component Value Date   HGBA1C 5.6 03/15/2015     Assessment & Plan:    1. Urinary frequency:    Patient has completed 12 weeks of PTNS therapy and he is now on the monthly maintenance PTNS.    2. Prostate cancer: Patient underwent cryotherapy for a Gleason's 6 (10 cores + out of 12)-invasion present with a PSA of 4.3 and a 11% free PSA PCa in 2009. His most recent PSA was 0.6 ng/mL on 11/14/2014.    Return in about 6 months (around 12/07/2015) for exam.  Zara Council, [facility]  [facility] [address] [place], [place] [address] [phone]

## 2015-06-09 LAB — PSA: Prostate Specific Ag, Serum: 0.6 ng/mL (ref 0.0–4.0)

## 2015-06-13 ENCOUNTER — Telehealth: Payer: Self-pay | Admitting: *Deleted

## 2015-06-13 NOTE — Telephone Encounter (Signed)
-----   Message from [name], PA-C sent at 06/09/2015  2:15 PM EST ----- Patient's PSA is stable.  We will see him in 6 months.  PSA to be drawn before his next appointment.

## 2015-06-13 NOTE — Telephone Encounter (Signed)
Spoke with patient's wife and gave results and we would see him again in 6 months with labs drawn prior and gave appt. Dates.

## 2015-06-21 ENCOUNTER — Encounter: Payer: Self-pay | Admitting: Urology

## 2015-06-21 ENCOUNTER — Ambulatory Visit (INDEPENDENT_AMBULATORY_CARE_PROVIDER_SITE_OTHER): Payer: Medicare Other | Admitting: Urology

## 2015-06-21 VITALS — BP 138/71 | HR 68 | Ht 69.0 in | Wt 177.6 lb

## 2015-06-21 DIAGNOSIS — R350 Frequency of micturition: Secondary | ICD-10-CM

## 2015-06-21 LAB — PTNS-PERCUTANEOUS TIBIAL NERVE STIMULATION: Scan Result: 18

## 2015-06-21 NOTE — Progress Notes (Signed)
Chief Complaint:  Chief Complaint  Patient presents with  . Urinary Frequency    Maint PTNS     HPI: Patient is an 81 year old white male who presents today for PTNS treatment for his urinary frequency.   This will be a maintenance treatment.    Background history [name] is an 81 year old white male who has a long-standing history of urinary frequency and dribbling.  He cannot recall a specific event when his urinary symptoms started.  He states he has a constant dribbling of a small amount of urine. This occurs all day.  He has not experienced an increase in the dribbling with coughing, sneezing, laughing, change in position or bending and lifting.      After his last treatment, he is making 6 (unchanged) trips to the bathroom during the daytime. He states he gets up 3 (unchanged) times nightly. He is not experiencing burning or pain with urination. He has a mild urgency to urinate. He is having 1 (worsening) episodes of incontinence daily.   He does not consume any caffeine beverages, he has 1-2 glasses of wine daily and he drinks eight 8 ounce glasses of water daily.          Previous Therapy: He has been tried on tamsulosin and Toviaz without improvement in his urinary symptoms.     Blood pressure 138/71, pulse 68, height 5\' 9"  (1.753 m), weight 177 lb 9.6 oz (80.559 kg).  He does not have any contraindications present for PTNS, such as: Pacemaker, Implantable defibrillator, History of abnormal bleeding or History of neuropathies or nerve damage.Contraindications present for PTNS       Discussed with patient possible complications of procedure, such as discomfort, bleeding at insertion/stimulation site, procedure consent signed  Patient goals: The patient's goals were to reduce his urgency and his frequency.  PTNS treatment: The needle electrode was inserted into the lower, inner aspect of the patient's left leg. The surface electrode was placed on the inside arch of the foot  on the treatment leg. The lead set was connected to the stimulator, and the needle electrode clip was  connected to the needle electrode. The stimulator that produces an adjustable electrical pulse that travels to sacral nerve plexus via the tibial nerve was increased to 18 and a sensory response and toe flex was observed.    Treatment Plan:  Patient tolerated the PTNS treatment well for 30 minutes. The electrode was removed with out difficulty. He will return in one monthfor his maintenance PTNS treatment.          Patient has prostate cancer:   Patient underwent cryotherapy for a Gleason's 6 (10 cores + out of 12)-invasion present with a PSA of 4.3 and a 11% free PSA PCa in 2009.   PSA History:       0.4 ng/mL on 12/12/2010       0.4 ng/mL on 06/17/2011       0.5 ng/mL on 01/01/2012       0.5 ng/mL on 12/30/2013       0.6 ng/mL on 11/14/2014       0.6 ng/mL on 06/08/2015

## 2015-06-22 ENCOUNTER — Ambulatory Visit: Payer: Medicare Other | Admitting: Urology

## 2015-06-26 ENCOUNTER — Telehealth: Payer: Self-pay | Admitting: Family Medicine

## 2015-06-26 NOTE — Telephone Encounter (Signed)
Everyone's schedule is full for this afternoon. Should I suggest urgent care? Please advise. Thanks TNP

## 2015-06-26 NOTE — Telephone Encounter (Signed)
Accidentally closed note. Can work in with mid-level tomorrow. Does not sound urgent. Thanks.

## 2015-06-26 NOTE — Telephone Encounter (Signed)
Can work in mid-level schedule in am. Thanks. Does not sound emergent.

## 2015-06-26 NOTE — Telephone Encounter (Signed)
Can work in with mid-level. Thanks.  

## 2015-06-26 NOTE — Telephone Encounter (Signed)
Noticed that [name] contacted pt and scheduled him with [name] tomorrow @ 10 am. Thanks TNP

## 2015-06-26 NOTE — Telephone Encounter (Signed)
Pt's wife called back.  Would like for him to be seen this afternoon if possible.  They are going out of town Thursday am.  Call back is [phone]  Thanks, Con Memos

## 2015-06-26 NOTE — Telephone Encounter (Signed)
Pt called saying he is having right hip and knee pain and would like to see you today or tomorrow.    Please advise [phone]  Thanks,  Con Memos

## 2015-06-27 ENCOUNTER — Encounter: Payer: Self-pay | Admitting: Family Medicine

## 2015-06-27 ENCOUNTER — Ambulatory Visit (INDEPENDENT_AMBULATORY_CARE_PROVIDER_SITE_OTHER): Payer: Medicare Other | Admitting: Family Medicine

## 2015-06-27 VITALS — BP 130/70 | HR 58 | Temp 97.5°F | Resp 16 | Wt 182.0 lb

## 2015-06-27 DIAGNOSIS — I10 Essential (primary) hypertension: Secondary | ICD-10-CM | POA: Insufficient documentation

## 2015-06-27 DIAGNOSIS — E7800 Pure hypercholesterolemia, unspecified: Secondary | ICD-10-CM | POA: Insufficient documentation

## 2015-06-27 DIAGNOSIS — R7303 Prediabetes: Secondary | ICD-10-CM | POA: Insufficient documentation

## 2015-06-27 DIAGNOSIS — M25551 Pain in right hip: Secondary | ICD-10-CM

## 2015-06-27 DIAGNOSIS — J309 Allergic rhinitis, unspecified: Secondary | ICD-10-CM | POA: Insufficient documentation

## 2015-06-27 DIAGNOSIS — R609 Edema, unspecified: Secondary | ICD-10-CM | POA: Insufficient documentation

## 2015-06-27 DIAGNOSIS — T148XXA Other injury of unspecified body region, initial encounter: Secondary | ICD-10-CM | POA: Insufficient documentation

## 2015-06-27 DIAGNOSIS — K5790 Diverticulosis of intestine, part unspecified, without perforation or abscess without bleeding: Secondary | ICD-10-CM | POA: Insufficient documentation

## 2015-06-27 DIAGNOSIS — B351 Tinea unguium: Secondary | ICD-10-CM | POA: Insufficient documentation

## 2015-06-27 DIAGNOSIS — Z8673 Personal history of transient ischemic attack (TIA), and cerebral infarction without residual deficits: Secondary | ICD-10-CM | POA: Insufficient documentation

## 2015-06-27 DIAGNOSIS — R202 Paresthesia of skin: Secondary | ICD-10-CM | POA: Insufficient documentation

## 2015-06-27 NOTE — Patient Instructions (Signed)
Discussed use of Advil two pills with food 3 x day with meals. May also use Tylenol also up  To 3000 mg.per day.

## 2015-06-27 NOTE — Progress Notes (Signed)
Subjective:     Patient ID: [name], male   DOB: 06-15-1925, 81 y.o.   MRN: [id number]  HPI  Chief Complaint  Patient presents with  . Hip Pain    Patient comes in office office today with complaints of right hip pain radiating down to right knee intermittent for the past month and hald. Patient states that he "may have pullled something" , patient reports becoming more active walking and lifting heavy objects. Patient states that he only has pain when bearing weight on the rightand with prolonged sitting, patient is taking otc Tylenol Extra Strength for pain.   States one week ago he had been vacuuming and lifting heavy boxes prior to the onset of his sx. Localizes pain to his right buttock area without radiation.   Review of Systems  Musculoskeletal:       Hx of right knee osteoarthritis       Objective:   Physical Exam  Constitutional: He appears well-developed and well-nourished. No distress.  Musculoskeletal:  Muscle strength in lower extremities 5/5. SLR's to 90 degrees without radiation of  pain. Mild tenderness over his ischial area. No lateral hip tenderness.       Assessment:    1. Ischial pain, right: suspect muscular strain/bursitis    Plan:    Discussed use of nsaid's and Tylenol as needed.

## 2015-07-21 ENCOUNTER — Encounter: Payer: Self-pay | Admitting: Urology

## 2015-07-21 ENCOUNTER — Ambulatory Visit (INDEPENDENT_AMBULATORY_CARE_PROVIDER_SITE_OTHER): Payer: Medicare Other | Admitting: Urology

## 2015-07-21 VITALS — BP 153/78 | HR 76 | Ht 69.25 in | Wt 183.5 lb

## 2015-07-21 DIAGNOSIS — R350 Frequency of micturition: Secondary | ICD-10-CM

## 2015-07-21 LAB — PTNS-PERCUTANEOUS TIBIAL NERVE STIMULATION: SCAN RESULT: 19

## 2015-07-21 NOTE — Progress Notes (Signed)
Chief Complaint:  Chief Complaint  Patient presents with  . Urinary Frequency    PTNS     HPI: Patient is an 82 year old white male who presents today for PTNS treatment for his urinary frequency.   This will be a maintenance treatment.    Background history [name] is an 82 year old white male who has a long-standing history of urinary frequency and dribbling.  He cannot recall a specific event when his urinary symptoms started.  He states he has a constant dribbling of a small amount of urine. This occurs all day.  He has not experienced an increase in the dribbling with coughing, sneezing, laughing, change in position or bending and lifting.      After his last treatment, he is making 6 (unchanged) trips to the bathroom during the daytime. He states he gets up 3 (unchanged) times nightly. He is not experiencing burning or pain with urination. He has a mild urgency to urinate. He is having 0-1 (stable) episodes of incontinence daily.   He does not consume any caffeine beverages, he has 1-2 glasses of wine daily and he drinks eight 8 ounce glasses of water daily.          Previous Therapy: He has been tried on tamsulosin and Toviaz without improvement in his urinary symptoms.     Blood pressure 153/78, pulse 76, height 5' 9.25" (1.759 m), weight 183 lb 8 oz (83.235 kg).  He does not have any contraindications present for PTNS, such as: Pacemaker, Implantable defibrillator, History of abnormal bleeding or History of neuropathies or nerve damage.Contraindications present for PTNS       Discussed with patient possible complications of procedure, such as discomfort, bleeding at insertion/stimulation site, procedure consent signed  Patient goals: The patient's goals were to reduce his urgency and his frequency.  PTNS treatment: The needle electrode was inserted into the lower, inner aspect of the patient's left leg. The surface electrode was placed on the inside arch of the foot on  the treatment leg. The lead set was connected to the stimulator, and the needle electrode clip was  connected to the needle electrode. The stimulator that produces an adjustable electrical pulse that travels to sacral nerve plexus via the tibial nerve was increased to 19 and a sensory response and toe flex was observed.    Treatment Plan:  Patient tolerated the PTNS treatment well for 30 minutes. The electrode was removed with out difficulty. He will return in one monthfor his maintenance PTNS treatment.          Patient has prostate cancer:   Patient underwent cryotherapy for a Gleason's 6 (10 cores + out of 12)-invasion present with a PSA of 4.3 and a 11% free PSA PCa in 2009.   PSA History:       0.4 ng/mL on 12/12/2010       0.4 ng/mL on 06/17/2011       0.5 ng/mL on 01/01/2012       0.5 ng/mL on 12/30/2013       0.6 ng/mL on 11/14/2014       0.6 ng/mL on 06/08/2015

## 2015-08-20 ENCOUNTER — Other Ambulatory Visit: Payer: Self-pay | Admitting: Family Medicine

## 2015-08-20 DIAGNOSIS — I10 Essential (primary) hypertension: Secondary | ICD-10-CM

## 2015-08-21 ENCOUNTER — Ambulatory Visit (INDEPENDENT_AMBULATORY_CARE_PROVIDER_SITE_OTHER): Payer: Medicare Other | Admitting: Urology

## 2015-08-21 VITALS — BP 190/79 | HR 70 | Ht 69.0 in | Wt 182.2 lb

## 2015-08-21 DIAGNOSIS — R350 Frequency of micturition: Secondary | ICD-10-CM

## 2015-08-21 NOTE — Progress Notes (Signed)
Chief Complaint:  Chief Complaint  Patient presents with  . PTNS    48MONTH     HPI: Patient is an 82 year old white male who presents today for PTNS treatment for his urinary frequency.   This will be a maintenance treatment.    Background history [name] is an 82 year old white male who has a long-standing history of urinary frequency and dribbling.  He cannot recall a specific event when his urinary symptoms started.  He states he has a constant dribbling of a small amount of urine. This occurs all day.  He has not experienced an increase in the dribbling with coughing, sneezing, laughing, change in position or bending and lifting.      After his last treatment, he is making 8 (worse) trips to the bathroom during the daytime. He states he gets up 3 (unchanged) times nightly. He is not experiencing burning or pain with urination. He has a mild urgency to urinate. He is having 0-1 (stable) episodes of incontinence daily.   He does not consume any caffeine beverages, he has 1-2 glasses of wine daily and he drinks eight 8 ounce glasses of water daily.          Previous Therapy: He has been tried on tamsulosin and Toviaz without improvement in his urinary symptoms.     Blood pressure 190/79, pulse 70, height 5\' 9"  (1.753 m), weight 182 lb 3.2 oz (82.645 kg).  He does not have any contraindications present for PTNS, such as: Pacemaker, Implantable defibrillator, History of abnormal bleeding or History of neuropathies or nerve damage.Contraindications present for PTNS       Discussed with patient possible complications of procedure, such as discomfort, bleeding at insertion/stimulation site, procedure consent signed  Patient goals: The patient's goals were to reduce his urgency and his frequency.  PTNS treatment: The needle electrode was inserted into the lower, inner aspect of the patient's left leg. The surface electrode was placed on the inside arch of the foot on the treatment leg.  The lead set was connected to the stimulator, and the needle electrode clip was  connected to the needle electrode. The stimulator that produces an adjustable electrical pulse that travels to sacral nerve plexus via the tibial nerve was increased to 15 and a sensory response and toe flex was observed.    Treatment Plan:  Patient tolerated the PTNS treatment well for 30 minutes. The electrode was removed with out difficulty. He will return in one monthfor his maintenance PTNS treatment.          Patient has prostate cancer:   Patient underwent cryotherapy for a Gleason's 6 (10 cores + out of 12)-invasion present with a PSA of 4.3 and a 11% free PSA PCa in 2009.   PSA History:       0.4 ng/mL on 12/12/2010       0.4 ng/mL on 06/17/2011       0.5 ng/mL on 01/01/2012       0.5 ng/mL on 12/30/2013       0.6 ng/mL on 11/14/2014       0.6 ng/mL on 06/08/2015

## 2015-08-21 NOTE — Progress Notes (Signed)
PTNS  Session # Maint. #4  Health & Social Factors: no change Caffeine: 0 Alcohol: 1 Daytime voids #per day: 8 Night-time voids #per night: 3 Urgency: mild Incontinence Episodes #per day: 0 Ankle used: left Treatment Setting: 15 Feeling/ Response: Sensory Comments: n/a  Preformed By: Zara Council, PAC  Assistant: Fonnie Jarvis, CMA  Follow Up: 1 month

## 2015-08-23 ENCOUNTER — Encounter: Payer: Self-pay | Admitting: Urology

## 2015-09-14 ENCOUNTER — Ambulatory Visit (INDEPENDENT_AMBULATORY_CARE_PROVIDER_SITE_OTHER): Payer: Medicare Other | Admitting: Family Medicine

## 2015-09-14 ENCOUNTER — Encounter: Payer: Self-pay | Admitting: Family Medicine

## 2015-09-14 VITALS — BP 110/60 | HR 70 | Temp 97.6°F | Resp 16 | Ht 69.0 in | Wt 181.0 lb

## 2015-09-14 DIAGNOSIS — R7309 Other abnormal glucose: Secondary | ICD-10-CM

## 2015-09-14 DIAGNOSIS — I10 Essential (primary) hypertension: Secondary | ICD-10-CM

## 2015-09-14 DIAGNOSIS — E7800 Pure hypercholesterolemia, unspecified: Secondary | ICD-10-CM | POA: Diagnosis not present

## 2015-09-14 DIAGNOSIS — Z0000 Encounter for general adult medical examination without abnormal findings: Secondary | ICD-10-CM | POA: Diagnosis not present

## 2015-09-14 NOTE — Progress Notes (Signed)
Patient ID: [name], male   DOB: 02-24-1925, 82 y.o.   MRN: [id number]       Patient: [name], Male    DOB: [birth year], 82 y.o.   MRN: [id number] Visit Date: 09/14/2015  Today's Provider: Margarita Rana, MD   Chief Complaint  Patient presents with  . Medicare Wellness   Subjective:    Annual wellness visit [name] is a 82 y.o. male. He feels well. He reports exercising 3 days a week. He reports he is sleeping well. 01/14/05 Colonoscopy-polyps, diverticulosis  Lab Results  Component Value Date   WBC 4.0 01/06/2015   HGB 14.6 04/25/2012   HCT 42.2 01/06/2015   PLT 221 01/06/2015   GLUCOSE 123* 01/06/2015   ALT 16 01/06/2015   AST 24 01/06/2015   NA 140 01/06/2015   K 4.4 01/06/2015   CL 100 01/06/2015   CREATININE 0.72* 01/06/2015   BUN 13 01/06/2015   CO2 25 01/06/2015   HGBA1C 5.6 03/15/2015    -----------------------------------------------------------   Review of Systems  Constitutional: Negative.   HENT: Positive for drooling, hearing loss, postnasal drip and rhinorrhea.   Eyes: Positive for photophobia, redness and itching.  Respiratory: Negative.   Cardiovascular: Negative.   Gastrointestinal: Positive for constipation.  Endocrine: Negative.   Genitourinary: Negative.   Musculoskeletal: Positive for myalgias and neck stiffness.  Skin: Negative.   Allergic/Immunologic: Positive for environmental allergies.  Neurological: Negative.   Hematological: Bruises/bleeds easily.  Psychiatric/Behavioral: Negative.     Social History   Social History  . Marital Status: Married    Spouse Name: N/A  . Number of Children: N/A  . Years of Education: N/A   Occupational History  . Not on file.   Social History Main Topics  . Smoking status: Former Smoker -- 10 years    Quit date: 07/08/1962  . Smokeless tobacco: Never Used     Comment: quit 1964  . Alcohol Use: 3.0 oz/week    5 Glasses of wine, 0 Standard drinks or equivalent per  week     Comment: beer occasionally  . Drug Use: No  . Sexual Activity: Not on file   Other Topics Concern  . Not on file   Social History Narrative    Past Medical History  Diagnosis Date  . Shingles 2014  . Stroke ([place]) 2012  . Hard of hearing   . Sinus problem   . Osteoporosis   . HBP (high blood pressure)   . Erectile dysfunction   . Nocturia   . Arthritis   . HLD (hyperlipidemia)   . Urinary frequency   . Urinary leakage   . GERD (gastroesophageal reflux disease)   . Bladder outlet obstruction   . History of nephrolithiasis   . Stomach ulcer   . Incomplete bladder emptying   . Prostate cancer [facility])      Patient Active Problem List   Diagnosis Date Noted  . Allergic rhinitis 06/27/2015  . Cerebral infarction ([place]) 06/27/2015  . DD (diverticular disease) 06/27/2015  . Accumulation of fluid in tissues 06/27/2015  . Benign essential HTN 06/27/2015  . Hypercholesterolemia 06/27/2015  . Burning or prickling sensation 06/27/2015  . Fungal infection of nail 06/27/2015  . Foot pain, right 04/10/2015  . Abnormal glucose 03/15/2015  . Prostate cancer ([place]) 12/18/2014  . Arthritis, degenerative 01/12/2014  . H/O malignant neoplasm of prostate 12/03/2012  . Urge incontinence 11/09/2012  . Obstruction of urinary tract 11/09/2012  . Dermatophytosis of groin 09/29/2012  .  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 Incomplete bladder emptying 09/29/2012    Past Surgical History  Procedure Laterality Date  . Cholecystectomy  1972  . Cataract extraction  2000, 2001  . Prostate surgery  2008/2009  . Tonsillectomy    . Umbilical hernia repair      His family history includes Cancer in his mother; Prostate cancer in his brother; Stroke in his father and sister. There is no history of Kidney disease.    Previous Medications   ACETAMINOPHEN (TYLENOL) 325 MG TABLET    Take 650 mg by mouth every 6 (six) hours as needed.   AMLODIPINE (NORVASC) 2.5 MG  TABLET    TAKE ONE TABLET BY MOUTH ONCE DAILY   ASPIRIN 81 MG TABLET    Take 81 mg by mouth daily.   HYDROCHLOROTHIAZIDE (MICROZIDE) 12.5 MG CAPSULE    Take 25 mg by mouth every morning.   LOSARTAN (COZAAR) 25 MG TABLET    Take 25 mg by mouth daily.    MULTIPLE VITAMIN (MULTIVITAMIN) CAPSULE    Take 1 capsule by mouth daily.   OVER THE COUNTER MEDICATION    Take by mouth 1 day or 1 dose. ESTER C   TAMSULOSIN (FLOMAX) 0.4 MG CAPS CAPSULE    Take 1 capsule (0.4 mg total) by mouth daily.   VITAMIN B-12 (CYANOCOBALAMIN) 1000 MCG TABLET    Take 1,000 mcg by mouth daily.    Patient Care Team: Margarita Rana, MD as PCP - General (Family Medicine) Robert Bellow, MD (General Surgery) Margarita Rana, MD as Referring Physician (Family Medicine)     Objective:   Vitals: BP 110/60 mmHg  Pulse 70  Temp(Src) 97.6 F (36.4 C) (Oral)  Resp 16  Ht 5\' 9"  (1.753 m)  Wt 181 lb (82.101 kg)  BMI 26.72 kg/m2  SpO2 96%  Physical Exam  Constitutional: He is oriented to person, place, and time. He appears well-developed and well-nourished.  HENT:  Head: Normocephalic and atraumatic.  Right Ear: External ear normal.  Left Ear: External ear normal.  Nose: Nose normal.  Mouth/Throat: Oropharynx is clear and moist.  Eyes: Conjunctivae and EOM are normal. Pupils are equal, round, and reactive to light.  Neck: Normal range of motion. Neck supple.  Cardiovascular: Normal rate, regular rhythm and normal heart sounds.   Pulmonary/Chest: Effort normal and breath sounds normal.  Abdominal: Soft. Bowel sounds are normal.  Musculoskeletal: Normal range of motion.  Neurological: He is alert and oriented to person, place, and time.  Skin: Skin is warm and dry.  Psychiatric: He has a normal mood and affect. His behavior is normal. Judgment and thought content normal.    Activities of Daily Living In your present state of health, do you have any difficulty performing the following activities: 09/14/2015    Hearing? Y  Vision? N  Difficulty concentrating or making decisions? N  Walking or climbing stairs? N  Dressing or bathing? N  Doing errands, shopping? N    Fall Risk Assessment Fall Risk  09/14/2015 03/15/2015 12/22/2014  Falls in the past year? No Yes No  Number falls in past yr: - 1 -  Injury with Fall? - No -  Risk for fall due to : - Impaired balance/gait;Impaired mobility Impaired balance/gait  Follow up - Education provided;Falls prevention discussed;Falls evaluation completed -     Depression Screen PHQ 2/9 Scores 09/14/2015 03/15/2015  PHQ - 2 Score 0 1    Cognitive Testing - 6-CIT  Correct?  Score   What year is it? yes 0 0 or 4  What month is it? yes 0 0 or 3  Memorize:    Pia Mau,  44,  [address],  [place],      What time is it? (within 1 hour) yes 0 0 or 3  Count backwards from 20 yes 0 0, 2, or 4  Name the months of the year yes 0 0, 2, or 4  Repeat name & address above no 3 0, 2, 4, 6, 8, or 10       TOTAL SCORE  3/28   Interpretation:  Normal  Normal (0-7) Abnormal (8-28)       Assessment & Plan:     Annual Wellness Visit  Reviewed patient's Family Medical History Reviewed and updated list of patient's medical providers Assessment of cognitive impairment was done Assessed patient's functional ability Established a written schedule for health screening [place] Completed and Reviewed  Exercise Activities and Dietary recommendations Goals    . Exercise 150 minutes per week (moderate activity)    . HEMOGLOBIN A1C < 7.0          1. Medicare annual wellness visit, subsequent Stable. Patient advised to continue eating healthy and exercise daily.  2. Benign essential HTN F/U pending lab report. - CBC with Differential/Platelet - Comprehensive metabolic panel  3. Abnormal glucose - Hemoglobin A1c  4. Hypercholesterolemia - Lipid Panel With LDL/HDL Ratio     Patient seen and examined by Dr. Jerrell Belfast, and  note scribed by Philbert Riser. Dimas, CMA.  I have reviewed the document for accuracy and completeness and I agree with above. Jerrell Belfast, MD   Margarita Rana, MD   ------------------------------------------------------------------------------------------------------------

## 2015-09-15 DIAGNOSIS — R7309 Other abnormal glucose: Secondary | ICD-10-CM | POA: Diagnosis not present

## 2015-09-15 DIAGNOSIS — E7800 Pure hypercholesterolemia, unspecified: Secondary | ICD-10-CM | POA: Diagnosis not present

## 2015-09-15 DIAGNOSIS — I10 Essential (primary) hypertension: Secondary | ICD-10-CM | POA: Diagnosis not present

## 2015-09-16 LAB — COMPREHENSIVE METABOLIC PANEL
ALK PHOS: 73 IU/L (ref 39–117)
ALT: 17 IU/L (ref 0–44)
AST: 26 IU/L (ref 0–40)
Albumin/Globulin Ratio: 1.8 (ref 1.1–2.5)
Albumin: 4 g/dL (ref 3.5–4.7)
BUN/Creatinine Ratio: 24 — ABNORMAL HIGH (ref 10–22)
BUN: 21 mg/dL (ref 8–27)
Bilirubin Total: 0.3 mg/dL (ref 0.0–1.2)
CO2: 24 mmol/L (ref 18–29)
CREATININE: 0.86 mg/dL (ref 0.76–1.27)
Calcium: 9.1 mg/dL (ref 8.6–10.2)
Chloride: 102 mmol/L (ref 96–106)
GFR calc Af Amer: 90 mL/min/{1.73_m2} (ref >59)
GFR calc non Af Amer: 77 mL/min/{1.73_m2} (ref >59)
GLOBULIN, TOTAL: 2.2 g/dL (ref 1.5–4.5)
Glucose: 113 mg/dL — ABNORMAL HIGH (ref 65–99)
POTASSIUM: 4.9 mmol/L (ref 3.5–5.2)
Sodium: 140 mmol/L (ref 134–144)
Total Protein: 6.2 g/dL (ref 6.0–8.5)

## 2015-09-16 LAB — CBC WITH DIFFERENTIAL/PLATELET
BASOS ABS: 0 10*3/uL (ref 0.0–0.2)
Basos: 1 %
EOS (ABSOLUTE): 0.1 10*3/uL (ref 0.0–0.4)
EOS: 3 %
HEMATOCRIT: 41.5 % (ref 37.5–51.0)
Hemoglobin: 13.9 g/dL (ref 12.6–17.7)
Immature Grans (Abs): 0 10*3/uL (ref 0.0–0.1)
Immature Granulocytes: 1 %
LYMPHS ABS: 1.2 10*3/uL (ref 0.7–3.1)
Lymphs: 24 %
MCH: 30.7 pg (ref 26.6–33.0)
MCHC: 33.5 g/dL (ref 31.5–35.7)
MCV: 92 fL (ref 79–97)
Monocytes Absolute: 0.4 10*3/uL (ref 0.1–0.9)
Monocytes: 8 %
Neutrophils Absolute: 3.1 10*3/uL (ref 1.4–7.0)
Neutrophils: 63 %
Platelets: 224 10*3/uL (ref 150–379)
RBC: 4.53 x10E6/uL (ref 4.14–5.80)
RDW: 14.2 % (ref 12.3–15.4)
WBC: 4.9 10*3/uL (ref 3.4–10.8)

## 2015-09-16 LAB — HEMOGLOBIN A1C
ESTIMATED AVERAGE GLUCOSE: 126 mg/dL
HEMOGLOBIN A1C: 6 % — AB (ref 4.8–5.6)

## 2015-09-16 LAB — LIPID PANEL WITH LDL/HDL RATIO
CHOLESTEROL TOTAL: 147 mg/dL (ref 100–199)
HDL: 59 mg/dL (ref >39)
LDL Calculated: 74 mg/dL (ref 0–99)
LDl/HDL Ratio: 1.3 ratio units (ref 0.0–3.6)
Triglycerides: 72 mg/dL (ref 0–149)
VLDL Cholesterol Cal: 14 mg/dL (ref 5–40)

## 2015-09-18 ENCOUNTER — Telehealth: Payer: Self-pay

## 2015-09-18 ENCOUNTER — Ambulatory Visit (INDEPENDENT_AMBULATORY_CARE_PROVIDER_SITE_OTHER): Payer: Medicare Other | Admitting: Urology

## 2015-09-18 ENCOUNTER — Encounter: Payer: Self-pay | Admitting: Urology

## 2015-09-18 VITALS — BP 191/91 | HR 73 | Ht 69.0 in | Wt 183.0 lb

## 2015-09-18 DIAGNOSIS — R350 Frequency of micturition: Secondary | ICD-10-CM | POA: Insufficient documentation

## 2015-09-18 NOTE — Progress Notes (Signed)
Chief Complaint:  Chief Complaint  Patient presents with  . Urinary Frequency    Maint. #5 PTNS     HPI: Patient is an 82 year old white male who presents today for PTNS treatment for his urinary frequency.   This will be a maintenance treatment.    Background history Mr. Holik is an 82 year old white male who has a long-standing history of urinary frequency and dribbling.  He cannot recall a specific event when his urinary symptoms started.  He states he has a constant dribbling of a small amount of urine. This occurs all day.  He has not experienced an increase in the dribbling with coughing, sneezing, laughing, change in position or bending and lifting.      After his last treatment, he is making 7-8 (stable) trips to the bathroom during the daytime. He states he gets up 3 (unchanged) times nightly. He is not experiencing burning or pain with urination. He has a mild urgency to urinate. He is having 0-1 (stable) episodes of incontinence daily.   He does not consume any caffeine beverages, he has 1-2 glasses of wine daily and he drinks eight 8 ounce glasses of water daily.          Previous Therapy: He has been tried on tamsulosin and Toviaz without improvement in his urinary symptoms.     Blood pressure 190/79, pulse 70, height 5\' 9"  (1.753 m), weight 182 lb 3.2 oz (82.645 kg).  He does not have any contraindications present for PTNS, such as: Pacemaker, Implantable defibrillator, History of abnormal bleeding or History of neuropathies or nerve damage.Contraindications present for PTNS       Discussed with patient possible complications of procedure, such as discomfort, bleeding at insertion/stimulation site, procedure consent signed  Patient goals: The patient's goals were to reduce his urgency and his frequency.  PTNS treatment: The needle electrode was inserted into the lower, inner aspect of the patient's left leg. The surface electrode was placed on the inside arch of the  foot on the treatment leg. The lead set was connected to the stimulator, and the needle electrode clip was  connected to the needle electrode. The stimulator that produces an adjustable electrical pulse that travels to sacral nerve plexus via the tibial nerve was increased to 2 and a sensory response and toe flex was observed.    Treatment Plan:  Patient tolerated the PTNS treatment well for 30 minutes. The electrode was removed with out difficulty. He will return in one monthfor his maintenance PTNS treatment.          Patient has prostate cancer:   Patient underwent cryotherapy for a Gleason's 6 (10 cores + out of 12)-invasion present with a PSA of 4.3 and a 11% free PSA PCa in 2009.   PSA History:       0.4 ng/mL on 12/12/2010       0.4 ng/mL on 06/17/2011       0.5 ng/mL on 01/01/2012       0.5 ng/mL on 12/30/2013       0.6 ng/mL on 11/14/2014       0.6 ng/mL on 06/08/2015

## 2015-09-18 NOTE — Progress Notes (Signed)
PTNS  Session # Maint. #5  Health & Social Factors: same Caffeine: 0 Alcohol: 1 (3oz) Daytime voids #per day: 7-8 Night-time voids #per night: 3 Urgency: mild Incontinence Episodes #per day: 0 Ankle used: left Treatment Setting: 2 Feeling/ Response: both Comments: n/a  Preformed By: Zara Council, PAC  Assistant: Fonnie Jarvis, CMA  Follow Up: 87month

## 2015-09-18 NOTE — Telephone Encounter (Signed)
-----   Message from Margarita Rana, MD sent at 09/16/2015  7:50 AM EST ----- Labs stable. Please notify patient.  Thanks

## 2015-09-18 NOTE — Telephone Encounter (Signed)
Mrs Zeoli advised as directed below.   Thanks,   -Mickel Baas

## 2015-10-23 ENCOUNTER — Encounter: Payer: Self-pay | Admitting: Urology

## 2015-10-23 ENCOUNTER — Ambulatory Visit (INDEPENDENT_AMBULATORY_CARE_PROVIDER_SITE_OTHER): Payer: Medicare Other | Admitting: Urology

## 2015-10-23 VITALS — BP 164/78 | HR 66 | Ht 69.0 in | Wt 184.4 lb

## 2015-10-23 DIAGNOSIS — R350 Frequency of micturition: Secondary | ICD-10-CM | POA: Diagnosis not present

## 2015-10-23 LAB — PTNS-PERCUTANEOUS TIBIAL NERVE STIMULATION: Scan Result: 14

## 2015-10-23 NOTE — Progress Notes (Signed)
Chief Complaint:  Chief Complaint  Patient presents with  . Urinary Frequency    PTNS     HPI: Patient is an 82 year old white male who presents today for PTNS treatment for his urinary frequency.   This will be a maintenance treatment.    Background history Mr. Risenhoover is an 82 year old white male who has a long-standing history of urinary frequency and dribbling.  He cannot recall a specific event when his urinary symptoms started.  He states he has a constant dribbling of a small amount of urine. This occurs all day.  He has not experienced an increase in the dribbling with coughing, sneezing, laughing, change in position or bending and lifting.      After his last treatment, he is making 7-8 (stable) trips to the bathroom during the daytime. He states he gets up 3 (unchanged) times nightly. He is not experiencing burning or pain with urination. He has a mild urgency to urinate. He is having 0-1 (stable) episodes of incontinence daily.  He feels the treatment are effective.  He does not consume any caffeine beverages, he has 1-2 glasses of wine daily and he drinks eight 8 ounce glasses of water daily.          Previous Therapy: He has been tried on tamsulosin and Toviaz without improvement in his urinary symptoms.     Blood pressure 164/78, pulse 66, height 5\' 9"  (1.753 m), weight 184 lb 6.4 oz (83.643 kg).  He does not have any contraindications present for PTNS, such as: Pacemaker, Implantable defibrillator, History of abnormal bleeding or History of neuropathies or nerve damage.Contraindications present for PTNS       Discussed with patient possible complications of procedure, such as discomfort, bleeding at insertion/stimulation site, procedure consent signed  Patient goals: The patient's goals were to reduce his urgency and his frequency.  PTNS treatment: The needle electrode was inserted into the lower, inner aspect of the patient's left leg. The surface electrode was placed  on the inside arch of the foot on the treatment leg. The lead set was connected to the stimulator, and the needle electrode clip was  connected to the needle electrode. The stimulator that produces an adjustable electrical pulse that travels to sacral nerve plexus via the tibial nerve was increased to 14 and a sensory response was observed.    Treatment Plan:  Patient tolerated the PTNS treatment well for 30 minutes. The electrode was removed with out difficulty. He will return in one monthfor his maintenance PTNS treatment.          Patient has prostate cancer:   Patient underwent cryotherapy for a Gleason's 6 (10 cores + out of 12)-invasion present with a PSA of 4.3 and a 11% free PSA PCa in 2009.   PSA History:       0.4 ng/mL on 12/12/2010       0.4 ng/mL on 06/17/2011       0.5 ng/mL on 01/01/2012       0.5 ng/mL on 12/30/2013       0.6 ng/mL on 11/14/2014       0.6 ng/mL on 06/08/2015  Patient will returning in June for PSA, I PSS score and exam.

## 2015-10-24 ENCOUNTER — Other Ambulatory Visit: Payer: Self-pay | Admitting: Family Medicine

## 2015-10-24 DIAGNOSIS — I10 Essential (primary) hypertension: Secondary | ICD-10-CM

## 2015-10-24 DIAGNOSIS — E7800 Pure hypercholesterolemia, unspecified: Secondary | ICD-10-CM

## 2015-11-17 ENCOUNTER — Other Ambulatory Visit: Payer: Self-pay

## 2015-11-17 DIAGNOSIS — C61 Malignant neoplasm of prostate: Secondary | ICD-10-CM

## 2015-11-20 ENCOUNTER — Other Ambulatory Visit: Payer: Medicare Other

## 2015-11-20 DIAGNOSIS — C61 Malignant neoplasm of prostate: Secondary | ICD-10-CM | POA: Diagnosis not present

## 2015-11-21 LAB — PSA: PROSTATE SPECIFIC AG, SERUM: 0.5 ng/mL (ref 0.0–4.0)

## 2015-11-27 ENCOUNTER — Encounter: Payer: Self-pay | Admitting: Urology

## 2015-11-27 ENCOUNTER — Ambulatory Visit (INDEPENDENT_AMBULATORY_CARE_PROVIDER_SITE_OTHER): Payer: Medicare Other | Admitting: Urology

## 2015-11-27 VITALS — BP 179/84 | HR 66 | Ht 70.0 in | Wt 183.8 lb

## 2015-11-27 DIAGNOSIS — C61 Malignant neoplasm of prostate: Secondary | ICD-10-CM

## 2015-11-27 DIAGNOSIS — R350 Frequency of micturition: Secondary | ICD-10-CM | POA: Diagnosis not present

## 2015-11-27 LAB — PTNS-PERCUTANEOUS TIBIAL NERVE STIMULATION: SCAN RESULT: 15

## 2015-11-27 NOTE — Progress Notes (Addendum)
1:37 PM   [name] 04-20-1925 [id number]  Referring provider: Margarita Rana, MD [address] [place], [place] [address]  Chief Complaint  Patient presents with  . Urinary Frequency    PTNS    HPI: Patient is an 82 year old Caucasian with prostate cancer who underwent cryotherapy for a Gleason's 6 (10 cores + out of 12)-invasion present with a PSA of 4.3 and a 11% free PSA PCa in 2009 with [name] presents today for a 6 month follow up.    He has not had any weight loss, bone pain or appetite loss.  He generally feels well.  He has not had any fevers, chills, nauseas or vomiting.  He has not had any dysuria, gross hematuria or suprapubic pain.    His baseline urinary symptoms consist of nocturia and leakage of urine.  He is currently receiving PTNS therapy for his symptoms.    IPSS score today is 7/3.        IPSS      11/27/15 1000       International Prostate Symptom Score   How often have you had the sensation of not emptying your bladder? About half the time     How often have you had to urinate less than every two hours? Less than 1 in 5 times     How often have you found you stopped and started again several times when you urinated? Not at All     How often have you found it difficult to postpone urination? Less than 1 in 5 times     How often have you had a weak urinary stream? Not at All     How often have you had to strain to start urination? Not at All     How many times did you typically get up at night to urinate? 2 Times     Total IPSS Score 7     Quality of Life due to urinary symptoms   If you were to spend the rest of your life with your urinary condition just the way it is now how would you feel about that? Mixed        Score:  1-7 Mild 8-19 Moderate 20-35 Severe  PMH: Past Medical History  Diagnosis Date  . Shingles 2014  . Stroke ([place]) 2012  . Hard of hearing   . Sinus problem   . Osteoporosis   . HBP (high blood  pressure)   . Erectile dysfunction   . Nocturia   . Arthritis   . HLD (hyperlipidemia)   . Urinary frequency   . Urinary leakage   . GERD (gastroesophageal reflux disease)   . Bladder outlet obstruction   . History of nephrolithiasis   . Stomach ulcer   . Incomplete bladder emptying   . Prostate cancer [facility])     Surgical History: Past Surgical History  Procedure Laterality Date  . Cholecystectomy  1972  . Cataract extraction  2000, 2001  . Prostate surgery  2008/2009  . Tonsillectomy    . Umbilical hernia repair      Home Medications:    Medication List       This list is accurate as of: 11/27/15  1:37 PM.  Always use your most recent med list.               acetaminophen 325 MG tablet  Commonly known as:  TYLENOL  Take 650 mg by mouth every 6 (  six) hours as needed.     amLODipine 2.5 MG tablet  Commonly known as:  NORVASC  TAKE ONE TABLET BY MOUTH ONCE DAILY     aspirin 81 MG tablet  Take 81 mg by mouth daily.     hydrochlorothiazide 12.5 MG capsule  Commonly known as:  MICROZIDE  TAKE ONE CAPSULE BY MOUTH ONCE DAILY     losartan 25 MG tablet  Commonly known as:  COZAAR  TAKE ONE TABLET BY MOUTH ONCE DAILY     multivitamin capsule  Take 1 capsule by mouth daily.     OVER THE COUNTER MEDICATION  Take by mouth 1 day or 1 dose. ESTER C     tamsulosin 0.4 MG Caps capsule  Commonly known as:  FLOMAX  Take 1 capsule (0.4 mg total) by mouth daily.     vitamin B-12 1000 MCG tablet  Commonly known as:  CYANOCOBALAMIN  Take 1,000 mcg by mouth daily.        Allergies:  Allergies  Allergen Reactions  . Toviaz [Fesoterodine Fumarate Er] Other (See Comments)    Acid reflux    Family History: Family History  Problem Relation Age of Onset  . Cancer Mother   . Stroke Father   . Stroke Sister   . Prostate cancer Brother   . Kidney disease Neg Hx     Social History:  reports that he quit smoking about 53 years ago. He has never used smokeless  tobacco. He reports that he drinks about 3.0 oz of alcohol per week. He reports that he does not use illicit drugs.  ROS: Urological Symptom Review  Patient is experiencing the following symptoms: Frequent urination Get up at night to urinate Leakage of urine   Review of Systems  Gastrointestinal (upper)  : Negative for upper GI symptoms  Gastrointestinal (lower) : Negative for lower GI symptoms  Constitutional : Negative for symptoms  Skin: Negative for skin symptoms  Eyes: Negative for eye symptoms  Ear/Nose/Throat : Negative for Ear/Nose/Throat symptoms  Hematologic/Lymphatic: Negative for Hematologic/Lymphatic symptoms  Cardiovascular : Negative for cardiovascular symptoms  Respiratory : Negative for respiratory symptoms  Endocrine: Negative for endocrine symptoms  Musculoskeletal: Negative for musculoskeletal symptoms  Neurological: Negative for neurological symptoms  Psychologic: Negative for psychiatric symptoms   Physical Exam: Blood pressure 134/61, pulse 71, height 5' 9.25" (1.759 m), weight 183 lb 1.6 oz (83.054 kg). GU: No CVA tenderness.  No bladder fullness or masses.  Patient with circumcised phallus.  Urethral meatus is patent.  No penile discharge. No penile lesions or rashes. Scrotum without lesions, cysts, rashes and/or edema.  Testicles are located scrotally bilaterally. No masses are appreciated in the testicles. Left and right epididymis are normal. Rectal: Patient with  normal sphincter tone. Anus and perineum without scarring or rashes. No rectal masses are appreciated. Prostate is approximately 25 grams, fibrotic, no nodules are appreciated. Seminal vesicles are normal.  Laboratory Data:  Lab Results  Component Value Date   WBC 4.9 09/15/2015   HGB 14.6 04/25/2012   HCT 41.5 09/15/2015   MCV 92 09/15/2015   PLT 224 09/15/2015    Lab Results  Component Value Date   CREATININE 0.86 09/15/2015  PSA History:       0.4  ng/mL on 12/12/2010       0.4 ng/mL on 06/17/2011       0.5 ng/mL on 01/01/2012       0.5 ng/mL on 12/30/2013  0.6 ng/mL on 11/14/2014         0.6 ng/mL on 06/08/2015         0.5 ng/mL on 11/20/2015   Lab Results  Component Value Date   HGBA1C 6.0* 09/15/2015     Assessment & Plan:    1. Urinary frequency:    Patient has completed 12 weeks of PTNS therapy and he is now on the monthly maintenance PTNS.    2. Prostate cancer: Patient underwent cryotherapy for a Gleason's 6 (10 cores + out of 12)-invasion present with a PSA of 4.3 and a 11% free PSA PCa in 2009. His most recent PSA was 0.6 ng/mL on 11/20/2015.   He will return for a repeated IPSS score, PSA and exam.    Return in about 1 month (around 12/28/2015) for maintenance PTNS.  Royden Purl  [facility] [address] [place], [place] [address] [phone]  Patient is an 82 year old white male who presents today for PTNS treatment for his urinary frequency. This will be a maintenance treatment.   Background history [name] is an 82 year old white male who has a long-standing history of urinary frequency and dribbling. He cannot recall a specific event when his urinary symptoms started. He states he has a constant dribbling of a small amount of urine. This occurs all day. He has not experienced an increase in the dribbling with coughing, sneezing, laughing, change in position or bending and lifting.   After his last treatment, he is making 7-8 (stable) trips to the bathroom during the daytime. He states he gets up 3 (unchanged) times nightly. He is not experiencing burning or pain with urination. He has a mild urgency to urinate. He is having 0-1 (stable) episodes of incontinence daily. I asked him several times during the visit if he felt the PTNS treatments were working.  He felt that they were providing benefit and would like to continue.    He does  not consume any caffeine beverages, he has 1-2 glasses of wine daily and he drinks eight 8 ounce glasses of water daily.    Previous Therapy: He has been tried on tamsulosin and Toviaz without improvement in his urinary symptoms.    Blood pressure 179/84, pulse 66, height 5\' 10"  (1.778 m), weight 183 lb 12.8 oz (83.371 kg).  He does not have any contraindications present for PTNS, such as: Pacemaker, Implantable defibrillator, History of abnormal bleeding or History of neuropathies or nerve damage.Contraindications present for PTNS   Discussed with patient possible complications of procedure, such as discomfort, bleeding at insertion/stimulation site, procedure consent signed  Patient goals: The patient's goals were to reduce his urgency and his frequency.  PTNS treatment: The needle electrode was inserted into the lower, inner aspect of the patient's left leg. The surface electrode was placed on the inside arch of the foot on the treatment leg. The lead set was connected to the stimulator, and the needle electrode clip was connected to the needle electrode. The stimulator that produces an adjustable electrical pulse that travels to sacral nerve plexus via the tibial nerve was increased to 15 and a sensory response and toe flex was observed.   Treatment Plan:  Patient tolerated the PTNS treatment well for 30 minutes. The electrode was removed with out difficulty. He will return in one monthfor his maintenance PTNS treatment.

## 2015-11-30 ENCOUNTER — Other Ambulatory Visit: Payer: Medicare Other

## 2015-12-07 ENCOUNTER — Ambulatory Visit: Payer: Medicare Other | Admitting: Urology

## 2015-12-15 ENCOUNTER — Ambulatory Visit (INDEPENDENT_AMBULATORY_CARE_PROVIDER_SITE_OTHER): Payer: Medicare Other | Admitting: Family Medicine

## 2015-12-15 ENCOUNTER — Encounter: Payer: Self-pay | Admitting: Family Medicine

## 2015-12-15 ENCOUNTER — Ambulatory Visit
Admission: RE | Admit: 2015-12-15 | Discharge: 2015-12-15 | Disposition: A | Discharge status: Home / Self Care | Payer: Medicare Other | Source: Ambulatory Visit | Attending: Family Medicine | Admitting: Family Medicine

## 2015-12-15 VITALS — BP 138/60 | HR 68 | Temp 98.2°F | Resp 16 | Wt 181.0 lb

## 2015-12-15 DIAGNOSIS — J209 Acute bronchitis, unspecified: Secondary | ICD-10-CM | POA: Insufficient documentation

## 2015-12-15 DIAGNOSIS — R05 Cough: Secondary | ICD-10-CM | POA: Diagnosis not present

## 2015-12-15 MED ORDER — AZITHROMYCIN 250 MG PO TABS: ORAL_TABLET | ORAL | Status: DC

## 2015-12-15 NOTE — Progress Notes (Signed)
Subjective:    Patient ID: [name], male    DOB: [birth year], 82 y.o.   MRN: [id number]  URI  This is a new problem. Episode onset: x 11 days. The problem has been waxing and waning. There has been no fever. Associated symptoms include congestion, coughing (productive with white sputum), rhinorrhea, sneezing, a sore throat (improved with gargling vinegar and water) and wheezing (at night). Pertinent negatives include no abdominal pain, chest pain, ear pain, headaches, nausea, neck pain, plugged ear sensation, sinus pain, swollen glands or vomiting. Treatments tried: Home remedy gargle, honey, cayenne. The treatment provided moderate relief.      Review of Systems  Constitutional: Positive for fatigue. Negative for fever.  HENT: Positive for congestion, postnasal drip, rhinorrhea, sneezing and sore throat (improved with gargling vinegar and water). Negative for ear pain.   Respiratory: Positive for cough (productive with white sputum) and wheezing (at night).   Cardiovascular: Negative for chest pain.  Gastrointestinal: Negative for nausea, vomiting and abdominal pain.  Musculoskeletal: Negative for neck pain.  Neurological: Negative for headaches.   BP 138/60 mmHg  Pulse 68  Temp(Src) 98.2 F (36.8 C) (Oral)  Resp 16  Wt 181 lb (82.101 kg)   Patient Active Problem List   Diagnosis Date Noted  . Urinary frequency 09/18/2015  . Allergic rhinitis 06/27/2015  . Cerebral infarction ([place]) 06/27/2015  . DD (diverticular disease) 06/27/2015  . Accumulation of fluid in tissues 06/27/2015  . Benign essential HTN 06/27/2015  . Hypercholesterolemia 06/27/2015  . Burning or prickling sensation 06/27/2015  . Fungal infection of nail 06/27/2015  . Foot pain, right 04/10/2015  . Abnormal glucose 03/15/2015  . Prostate cancer ([place]) 12/18/2014  . Arthritis, degenerative 01/12/2014  . H/O malignant neoplasm of prostate 12/03/2012  . Urge incontinence 11/09/2012  . Obstruction of  urinary tract 11/09/2012  . Dermatophytosis of groin 09/29/2012  . 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 Incomplete bladder emptying 09/29/2012   Past Medical History  Diagnosis Date  . Shingles 2014  . Stroke ([place]) 2012  . Hard of hearing   . Sinus problem   . Osteoporosis   . HBP (high blood pressure)   . Erectile dysfunction   . Nocturia   . Arthritis   . HLD (hyperlipidemia)   . Urinary frequency   . Urinary leakage   . GERD (gastroesophageal reflux disease)   . Bladder outlet obstruction   . History of nephrolithiasis   . Stomach ulcer   . Incomplete bladder emptying   . Prostate cancer [facility])    Current Outpatient Prescriptions on File Prior to Visit  Medication Sig  . acetaminophen (TYLENOL) 325 MG tablet Take 650 mg by mouth every 6 (six) hours as needed.  [name] amLODipine (NORVASC) 2.5 MG tablet TAKE ONE TABLET BY MOUTH ONCE DAILY  . aspirin 81 MG tablet Take 81 mg by mouth daily.  . hydrochlorothiazide (MICROZIDE) 12.5 MG capsule TAKE ONE CAPSULE BY MOUTH ONCE DAILY  . losartan (COZAAR) 25 MG tablet TAKE ONE TABLET BY MOUTH ONCE DAILY  . Multiple Vitamin (MULTIVITAMIN) capsule Take 1 capsule by mouth daily.  [name] OVER THE COUNTER MEDICATION Take by mouth 1 day or 1 dose. ESTER C  . vitamin B-12 (CYANOCOBALAMIN) 1000 MCG tablet Take 1,000 mcg by mouth daily.   No current facility-administered medications on file prior to visit.   Allergies  Allergen Reactions  . Toviaz [Fesoterodine Fumarate Er] Other (See Comments)    Acid reflux  Past Surgical History  Procedure Laterality Date  . Cholecystectomy  1972  . Cataract extraction  2000, 2001  . Prostate surgery  2008/2009  . Tonsillectomy    . Umbilical hernia repair     Social History   Social History  . Marital Status: Married    Spouse Name: N/A  . Number of Children: N/A  . Years of Education: N/A   Occupational History  . Not on file.   Social History Main Topics  . Smoking  status: Former Smoker -- 10 years    Quit date: 07/08/1962  . Smokeless tobacco: Never Used     Comment: quit 1964  . Alcohol Use: 3.0 oz/week    5 Glasses of wine, 0 Standard drinks or equivalent per week     Comment: beer occasionally  . Drug Use: No  . Sexual Activity: Not on file   Other Topics Concern  . Not on file   Social History Narrative   Family History  Problem Relation Age of Onset  . Cancer Mother   . Stroke Father   . Stroke Sister   . Prostate cancer Brother   . Kidney disease Neg Hx        Objective:   Physical Exam  Constitutional: He appears well-developed and well-nourished.  HENT:  Head: Normocephalic and atraumatic.  Right Ear: External ear normal.  Left Ear: External ear normal.  Nose: Right sinus exhibits no maxillary sinus tenderness and no frontal sinus tenderness. Left sinus exhibits no maxillary sinus tenderness and no frontal sinus tenderness.  Mouth/Throat: No oropharyngeal exudate.  Turbinates are swollen  Pulmonary/Chest: Effort normal.  Inspiratory and expiratory rhonchi and more on right side than left lung  Psychiatric: He has a normal mood and affect. His behavior is normal.     Assessment & Plan:  1. Acute bronchitis, unspecified organism New problem. Treat as below. Call office if sx do not improve or worsen. - DG Chest 2 View; Future - azithromycin (ZITHROMAX) 250 MG tablet; Take 2 tablets on day 1, then 1 tablet each remaining day until gone  Dispense: 6 tablet; Refill: 0    Patient seen and examined by Jerrell Belfast, MD, and note scribed by Renaldo Fiddler, CMA.  I have reviewed the document for accuracy and completeness and I agree with above. Jerrell Belfast, MD  Margarita Rana, MD

## 2015-12-18 ENCOUNTER — Telehealth: Payer: Self-pay

## 2015-12-18 NOTE — Telephone Encounter (Signed)
Patient advised as below.  

## 2015-12-18 NOTE — Telephone Encounter (Signed)
-----   Message from Margarita Rana, MD sent at 12/15/2015  4:10 PM EDT ----- CXR stable. Please notify patient. Thanks.

## 2015-12-22 ENCOUNTER — Telehealth: Payer: Self-pay

## 2015-12-22 NOTE — Telephone Encounter (Signed)
Patient advised as below.  

## 2015-12-22 NOTE — Telephone Encounter (Signed)
Patient was seen on June 9th and was given Zpak and he states he feels about 50 to 60% better. But he is still coughing at night mainly with laying down flat, he has been sleeping in recliner, a lot of post nasal drainage, no energy. NO fever or chills. He has used cough drops. What else he can try? Please review-aa

## 2015-12-22 NOTE — Telephone Encounter (Signed)
Would try some OTC Zyrtec. May need follow up tomorrow or next week if does not improve.  Thanks.

## 2015-12-25 ENCOUNTER — Encounter: Payer: Self-pay | Admitting: Urology

## 2015-12-25 ENCOUNTER — Ambulatory Visit (INDEPENDENT_AMBULATORY_CARE_PROVIDER_SITE_OTHER): Payer: Medicare Other | Admitting: Urology

## 2015-12-25 VITALS — BP 142/79 | HR 98 | Ht 69.0 in | Wt 184.9 lb

## 2015-12-25 DIAGNOSIS — R350 Frequency of micturition: Secondary | ICD-10-CM | POA: Diagnosis not present

## 2015-12-25 LAB — PTNS-PERCUTANEOUS TIBIAL NERVE STIMULATION: Scan Result: 9

## 2015-12-25 NOTE — Progress Notes (Signed)
    2:39 PM   [name] Marrow Dec 05, 1924 MD:[id number]  Referring provider: Margarita Rana, MD [address] [place], [place] [address]  Chief Complaint  Patient presents with  . Urinary Frequency    Monthly Maint PTNS    HPI: Patient is an 82 year old Caucasian male who presents today for PTNS treatment for his urinary frequency. This will be a maintenance treatment.   Background history [name] is an 82 year old white male who has a long-standing history of urinary frequency and dribbling. He cannot recall a specific event when his urinary symptoms started. He states he has a constant dribbling of a small amount of urine. This occurs all day. He has not experienced an increase in the dribbling with coughing, sneezing, laughing, change in position or bending and lifting.   After his last treatment, he is making 7-8 (stable) trips to the bathroom during the daytime. He states he gets up 3 (unchanged) times nightly. He is not experiencing burning or pain with urination. He has a mild urgency to urinate. He is having 0-1 (stable) episodes of incontinence daily. Looking back at his initial PTNS treatment, he was having day time voids every hour, three nightly voids and always dribbling.  So, he has made significant progress.    He does not consume any caffeine beverages, he has 1-2 glasses of wine daily and he drinks eight 8 ounce glasses of water daily.    Previous Therapy: He has been tried on tamsulosin and Toviaz without improvement in his urinary symptoms.    Blood pressure 142/79, pulse 98, height 5\' 9"  (1.753 m), weight 184 lb 14.4 oz (83.87 kg).  He does not have any contraindications present for PTNS, such as: Pacemaker, Implantable defibrillator, History of abnormal bleeding or History of neuropathies or nerve damage.Contraindications present for PTNS   Discussed with patient possible complications of procedure, such as discomfort,  bleeding at insertion/stimulation site, procedure consent signed  Patient goals: The patient's goals were to reduce his urgency and his frequency.  PTNS treatment: The needle electrode was inserted into the lower, inner aspect of the patient's left leg. The surface electrode was placed on the inside arch of the foot on the treatment leg. The lead set was connected to the stimulator, and the needle electrode clip was connected to the needle electrode. The stimulator that produces an adjustable electrical pulse that travels to sacral nerve plexus via the tibial nerve was increased to 9 and a sensory response was observed.   Treatment Plan:  Patient tolerated the PTNS treatment well for 30 minutes. The electrode was removed with out difficulty. He will return in one monthfor his maintenance PTNS treatment.

## 2016-01-03 ENCOUNTER — Ambulatory Visit: Payer: Medicare Other | Admitting: Family Medicine

## 2016-01-04 ENCOUNTER — Ambulatory Visit (INDEPENDENT_AMBULATORY_CARE_PROVIDER_SITE_OTHER): Payer: Medicare Other | Admitting: Physician Assistant

## 2016-01-04 ENCOUNTER — Encounter: Payer: Self-pay | Admitting: Physician Assistant

## 2016-01-04 VITALS — BP 134/60 | HR 65 | Temp 97.7°F | Resp 16 | Wt 184.8 lb

## 2016-01-04 DIAGNOSIS — F32A Depression, unspecified: Secondary | ICD-10-CM

## 2016-01-04 DIAGNOSIS — F329 Major depressive disorder, single episode, unspecified: Secondary | ICD-10-CM | POA: Diagnosis not present

## 2016-01-04 MED ORDER — ESCITALOPRAM OXALATE 5 MG PO TABS: 5.0000 mg | ORAL_TABLET | Freq: Every day | ORAL | Status: DC

## 2016-01-04 NOTE — Progress Notes (Signed)
Patient: [name] Male    DOB: [birth year]   82 y.o.   MRN: [id number] Visit Date: 01/04/2016  Today's Provider: Mar Daring, PA-C   Chief Complaint  Patient presents with  . Depression  . Memory Loss    concern about not remebering certain things   Subjective:    Depression        This is a new problem.  The current episode started in the past 7 days (For the past two weeks. He has a friend that is about to pass away and he is feeling very sad).   The onset quality is gradual.   The problem occurs daily.  The problem has been gradually worsening since onset.  Associated symptoms include decreased concentration, fatigue, irritable, decreased interest and sad.  Associated symptoms include no helplessness, no hopelessness, does not have insomnia, no restlessness and no suicidal ideas.( Very hard to remember things.)     The symptoms are aggravated by social issues. He feels like his legs are hurting and making him very tired.  He has a friend that has Parkinson's that fell and developed a TBI that he has been concerned about. He also has been concerned of the [employer] that he is on and new changes that are occurring because of the friend that has the TBI (he had been the Magazine features editor of [employer]). They do not have anyone that has stood up to take the role of Magazine features editor. 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 He also has concerns that his art work is not as crisp and detailed as it has been in the past due to some hand tremor, not bad enough to have him stop however. They also have a cat that is 37.13 years old that is getting more sick and him and his wife are both concerned they may have to put her down later this year.  He has had issues with mood and depression in the past. It is always situational like this. He has tried some medications that "pepped" him up when he was in his 16s and has used [place] as well.    Memory:He is concern that he forgets certain things. Cognitive Testing - 6-CIT  Correct? Score   What year is it? yes 0 0 or 4  What month is it? yes 0 0 or 3  Memorize:    Pia Mau,  44,  [place],      What time is it? (within 1 hour) yes 0 0 or 3  Count backwards from 20 yes 0 0, 2, or 4  Name the months of the year yes 0 0, 2, or 4  Repeat name & address above yes 0 0, 2, 4, 6, 8, or 10       TOTAL SCORE  0/28   Interpretation:  Normal  Normal (0-7) Abnormal (8-28)      Allergies  Allergen Reactions  . Toviaz [Fesoterodine Fumarate Er] Other (See Comments)    Acid reflux   Current Meds  Medication Sig  . acetaminophen (TYLENOL) 325 MG tablet Take 650 mg by mouth every 6 (six) hours as needed.  [name] amLODipine (NORVASC) 2.5 MG tablet TAKE ONE TABLET BY MOUTH ONCE DAILY  . aspirin 81 MG tablet Take 81 mg by mouth daily.  [name] glucosamine-chondroitin 500-400 MG tablet Take 1 tablet by mouth 3 (three)  times daily.  . hydrochlorothiazide (MICROZIDE) 12.5 MG capsule TAKE ONE CAPSULE BY MOUTH ONCE DAILY  . losartan (COZAAR) 25 MG tablet TAKE ONE TABLET BY MOUTH ONCE DAILY  . Multiple Vitamin (MULTIVITAMIN) capsule Take 1 capsule by mouth daily.  [name] OVER THE COUNTER MEDICATION Take by mouth 1 day or 1 dose. ESTER C  . vitamin B-12 (CYANOCOBALAMIN) 1000 MCG tablet Take 1,000 mcg by mouth daily.    Review of Systems  Constitutional: Positive for fatigue.  Respiratory: Negative.   Cardiovascular: Negative for chest pain, palpitations and leg swelling.  Gastrointestinal: Negative.   Psychiatric/Behavioral: Positive for depression, dysphoric mood, decreased concentration and agitation. Negative for suicidal ideas, sleep disturbance and self-injury. The patient is not nervous/anxious and does not have insomnia.     Social History  Substance Use Topics  . Smoking status: Former Smoker -- 10 years    Quit date: 07/08/1962  . Smokeless tobacco: Never Used     Comment:  quit 1964  . Alcohol Use: 3.0 oz/week    5 Glasses of wine, 0 Standard drinks or equivalent per week     Comment: beer occasionally   Objective:   BP 134/60 mmHg  Pulse 65  Temp(Src) 97.7 F (36.5 C) (Oral)  Resp 16  Wt 184 lb 12.8 oz (83.825 kg)  Physical Exam  Constitutional: He appears well-developed and well-nourished. He is irritable. No distress.  HENT:  Head: Normocephalic and atraumatic.  Neck: Normal range of motion. Neck supple.  Cardiovascular: Normal rate, regular rhythm and normal heart sounds.  Exam reveals no gallop and no friction rub.   No murmur heard. Pulmonary/Chest: Effort normal and breath sounds normal. No respiratory distress. He has no wheezes. He has no rales.  Skin: He is not diaphoretic.  Psychiatric: He has a normal mood and affect. His behavior is normal. Judgment and thought content normal.  Mood ok today but wife states he has been more irritable of recent, especially over the last few months.  Vitals reviewed.     Assessment & Plan:     1. Depression Will try low dose lexapro as below. He is to call the office if symptoms worsen or if he develops any suicidal ideations. If medication well tolerated I will see him back in 4 weeks to re-evaluate. - escitalopram (LEXAPRO) 5 MG tablet; Take 1 tablet (5 mg total) by mouth at bedtime.  Dispense: 30 tablet; Refill: 0       Mar Daring, PA-C  [facility]

## 2016-01-04 NOTE — Patient Instructions (Signed)
Major Depressive Disorder Major depressive disorder is a mental illness. It also may be called clinical depression or unipolar depression. Major depressive disorder usually causes feelings of sadness, hopelessness, or helplessness. Some people with this disorder do not feel particularly sad but lose interest in doing things they used to enjoy (anhedonia). Major depressive disorder also can cause physical symptoms. It can interfere with work, school, relationships, and other normal everyday activities. The disorder varies in severity but is longer lasting and more serious than the sadness we all feel from time to time in our lives. Major depressive disorder often is triggered by stressful life events or major life changes. Examples of these triggers include divorce, loss of your job or home, a move, and the death of a family member or close friend. Sometimes this disorder occurs for no obvious reason at all. People who have family members with major depressive disorder or bipolar disorder are at higher risk for developing this disorder, with or without life stressors. Major depressive disorder can occur at any age. It may occur just once in your life (single episode major depressive disorder). It may occur multiple times (recurrent major depressive disorder). SYMPTOMS People with major depressive disorder have either anhedonia or depressed mood on nearly a daily basis for at least 2 weeks or longer. Symptoms of depressed mood include:  Feelings of sadness (blue or down in the dumps) or emptiness.  Feelings of hopelessness or helplessness.  Tearfulness or episodes of crying (may be observed by others).  Irritability (children and adolescents). In addition to depressed mood or anhedonia or both, people with this disorder have at least four of the following symptoms:  Difficulty sleeping or sleeping too much.   Significant change (increase or decrease) in appetite or weight.   Lack of energy or  motivation.  Feelings of guilt and worthlessness.   Difficulty concentrating, remembering, or making decisions.  Unusually slow movement (psychomotor retardation) or restlessness (as observed by others).   Recurrent wishes for death, recurrent thoughts of self-harm (suicide), or a suicide attempt. People with major depressive disorder commonly have persistent negative thoughts about themselves, other people, and the world. People with severe major depressive disorder may experiencedistorted beliefs or perceptions about the world (psychotic delusions). They also may see or hear things that are not real (psychotic hallucinations). DIAGNOSIS Major depressive disorder is diagnosed through an assessment by your health care provider. Your health care provider will ask aboutaspects of your daily life, such as mood,sleep, and appetite, to see if you have the diagnostic symptoms of major depressive disorder. Your health care provider may ask about your medical history and use of alcohol or drugs, including prescription medicines. Your health care provider also may do a physical exam and blood work. This is because certain medical conditions and the use of certain substances can cause major depressive disorder-like symptoms (secondary depression). Your health care provider also may refer you to a mental health specialist for further evaluation and treatment. TREATMENT It is important to recognize the symptoms of major depressive disorder and seek treatment. The following treatments can be prescribed for this disorder:   Medicine. Antidepressant medicines usually are prescribed. Antidepressant medicines are thought to correct chemical imbalances in the brain that are commonly associated with major depressive disorder. Other types of medicine may be added if the symptoms do not respond to antidepressant medicines alone or if psychotic delusions or hallucinations occur.  Talk therapy. Talk therapy can be  helpful in treating major depressive disorder by providing   support, education, and guidance. Certain types of talk therapy also can help with negative thinking (cognitive behavioral therapy) and with relationship issues that trigger this disorder (interpersonal therapy). A mental health specialist can help determine which treatment is best for you. Most people with major depressive disorder do well with a combination of medicine and talk therapy. Treatments involving electrical stimulation of the brain can be used in situations with extremely severe symptoms or when medicine and talk therapy do not work over time. These treatments include electroconvulsive therapy, transcranial magnetic stimulation, and vagal nerve stimulation.   This information is not intended to replace advice given to you by your health care provider. Make sure you discuss any questions you have with your health care provider.   Document Released: 10/19/2012 Document Revised: 07/15/2014 Document Reviewed: 10/19/2012 Elsevier Interactive Patient Education 2016 [employer]. Escitalopram tablets What is this medicine? ESCITALOPRAM (es sye TAL oh pram) is used to treat depression and certain types of anxiety. This medicine may be used for other purposes; ask your health care provider or pharmacist if you have questions. What should I tell my health care provider before I take this medicine? They need to know if you have any of these conditions: -bipolar disorder or a family history of bipolar disorder -diabetes -glaucoma -heart disease -kidney or liver disease -receiving electroconvulsive therapy -seizures (convulsions) -suicidal thoughts, plans, or attempt by you or a family member -an unusual or allergic reaction to escitalopram, the related drug citalopram, other medicines, foods, dyes, or preservatives -pregnant or trying to become pregnant -breast-feeding How should I use this medicine? Take this medicine by mouth  with a glass of water. Follow the directions on the prescription label. You can take it with or without food. If it upsets your stomach, take it with food. Take your medicine at regular intervals. Do not take it more often than directed. Do not stop taking this medicine suddenly except upon the advice of your doctor. Stopping this medicine too quickly may cause serious side effects or your condition may worsen. A special MedGuide will be given to you by the pharmacist with each prescription and refill. Be sure to read this information carefully each time. Talk to your pediatrician regarding the use of this medicine in children. Special care may be needed. Overdosage: If you think you have taken too much of this medicine contact a poison control center or emergency room at once. NOTE: This medicine is only for you. Do not share this medicine with others. What if I miss a dose? If you miss a dose, take it as soon as you can. If it is almost time for your next dose, take only that dose. Do not take double or extra doses. What may interact with this medicine? Do not take this medicine with any of the following medications: -certain medicines for fungal infections like fluconazole, itraconazole, ketoconazole, posaconazole, voriconazole -cisapride -citalopram -dofetilide -dronedarone -linezolid -MAOIs like Carbex, Eldepryl, Marplan, Nardil, and Parnate -methylene blue (injected into a vein) -pimozide -thioridazine -ziprasidone This medicine may also interact with the following medications: -alcohol -aspirin and aspirin-like medicines -carbamazepine -certain medicines for depression, anxiety, or psychotic disturbances -certain medicines for migraine headache like almotriptan, eletriptan, frovatriptan, naratriptan, rizatriptan, sumatriptan, zolmitriptan -certain medicines for sleep -certain medicines that treat or prevent blood clots like warfarin, enoxaparin,  dalteparin -cimetidine -diuretics -fentanyl -furazolidone -isoniazid -lithium -metoprolol -NSAIDs, medicines for pain and inflammation, like ibuprofen or naproxen -other medicines that prolong the QT interval (cause an abnormal heart rhythm) -procarbazine -  rasagiline -supplements like St. John's wort, kava kava, valerian -tramadol -tryptophan This list may not describe all possible interactions. Give your health care provider a list of all the medicines, herbs, non-prescription drugs, or dietary supplements you use. Also tell them if you smoke, drink alcohol, or use illegal drugs. Some items may interact with your medicine. What should I watch for while using this medicine? Tell your doctor if your symptoms do not get better or if they get worse. Visit your doctor or health care professional for regular checks on your progress. Because it may take several weeks to see the full effects of this medicine, it is important to continue your treatment as prescribed by your doctor. Patients and their families should watch out for new or worsening thoughts of suicide or depression. Also watch out for sudden changes in feelings such as feeling anxious, agitated, panicky, irritable, hostile, aggressive, impulsive, severely restless, overly excited and hyperactive, or not being able to sleep. If this happens, especially at the beginning of treatment or after a change in dose, call your health care professional. Dennis Bast may get drowsy or dizzy. Do not drive, use machinery, or do anything that needs mental alertness until you know how this medicine affects you. Do not stand or sit up quickly, especially if you are an older patient. This reduces the risk of dizzy or fainting spells. Alcohol may interfere with the effect of this medicine. Avoid alcoholic drinks. Your mouth may get dry. Chewing sugarless gum or sucking hard candy, and drinking plenty of water may help. Contact your doctor if the problem does not go  away or is severe. What side effects may I notice from receiving this medicine? Side effects that you should report to your doctor or health care professional as soon as possible: -allergic reactions like skin rash, itching or hives, swelling of the face, lips, or tongue -confusion -feeling faint or lightheaded, falls -fast talking and excited feelings or actions that are out of control -hallucination, loss of contact with reality -seizures -suicidal thoughts or other mood changes -unusual bleeding or bruising Side effects that usually do not require medical attention (report to your doctor or health care professional if they continue or are bothersome): -blurred vision -changes in appetite -change in sex drive or performance -headache -increased sweating -nausea This list may not describe all possible side effects. Call your doctor for medical advice about side effects. You may report side effects to FDA at 1-800-FDA-1088. Where should I keep my medicine? Keep out of reach of children. Store at room temperature between 15 and 30 degrees C (59 and 86 degrees F). Throw away any unused medicine after the expiration date. NOTE: This sheet is a summary. It may not cover all possible information. If you have questions about this medicine, talk to your doctor, pharmacist, or health care provider.    2016, Elsevier/Gold Standard. (2013-01-19 12:32:55)

## 2016-01-12 ENCOUNTER — Other Ambulatory Visit: Payer: Self-pay | Admitting: Urology

## 2016-01-24 ENCOUNTER — Ambulatory Visit: Payer: Medicare Other | Admitting: Urology

## 2016-01-29 ENCOUNTER — Encounter: Payer: Self-pay | Admitting: Urology

## 2016-01-29 ENCOUNTER — Ambulatory Visit (INDEPENDENT_AMBULATORY_CARE_PROVIDER_SITE_OTHER): Payer: Medicare Other | Admitting: Urology

## 2016-01-29 VITALS — BP 134/66 | HR 68 | Ht 69.5 in | Wt 178.1 lb

## 2016-01-29 DIAGNOSIS — R350 Frequency of micturition: Secondary | ICD-10-CM | POA: Diagnosis not present

## 2016-01-29 LAB — PTNS-PERCUTANEOUS TIBIAL NERVE STIMULATION: Antimicrobial Susceptibility: 19

## 2016-01-29 NOTE — Progress Notes (Signed)
    10:43 AM   [name] 03/06/1925 [id number]  Referring provider: Mar Daring, PA-C [place] [address] [place], [place] [address]  Chief Complaint  Patient presents with  . Urinary Frequency    ptns    HPI: Patient is an 82 year old Caucasian male who presents today for PTNS treatment for his urinary frequency. This will be a maintenance treatment.   Background history [name] is an 82 year old white male who has a long-standing history of urinary frequency and dribbling. He cannot recall a specific event when his urinary symptoms started. He states he has a constant dribbling of a small amount of urine. This occurs all day. He has not experienced an increase in the dribbling with coughing, sneezing, laughing, change in position or bending and lifting.   Looking back at his initial PTNS treatment, he was having day time voids every hour, three nightly voids and always dribbling.  So, he has made significant progress.    After his last treatment, he is making 8 (stable) trips to the bathroom during the daytime. He states he gets up 3 (unchanged) times nightly. He is not experiencing burning or pain with urination. He has a mild urgency to urinate. He is having 0 (stable) episodes of incontinence daily.    He does not consume any caffeine beverages, he has 1-2 glasses of wine daily and he drinks eight 8 ounce glasses of water daily.    Previous Therapy: He has been tried on tamsulosin and Toviaz without improvement in his urinary symptoms.    Blood pressure 134/66, pulse 68, height 5' 9.5" (1.765 m), weight 178 lb 1.6 oz (80.8 kg).  He does not have any contraindications present for PTNS, such as: Pacemaker, Implantable defibrillator, History of abnormal bleeding or History of neuropathies or nerve damage.Contraindications present for PTNS   Discussed with patient possible complications of procedure, such as discomfort, bleeding at  insertion/stimulation site, procedure consent signed  Patient goals: The patient's goals were to reduce his urgency and his frequency.  PTNS treatment: The needle electrode was inserted into the lower, inner aspect of the patient's left leg. The surface electrode was placed on the inside arch of the foot on the treatment leg. The lead set was connected to the stimulator, and the needle electrode clip was connected to the needle electrode. The stimulator that produces an adjustable electrical pulse that travels to sacral nerve plexus via the tibial nerve was increased to 19 and a sensory response and toe flex was observed.   Treatment Plan:  Patient tolerated the PTNS treatment well for 30 minutes. The electrode was removed with out difficulty. He will return in one monthfor his maintenance PTNS treatment.

## 2016-02-01 ENCOUNTER — Ambulatory Visit (INDEPENDENT_AMBULATORY_CARE_PROVIDER_SITE_OTHER): Payer: Medicare Other | Admitting: Physician Assistant

## 2016-02-01 ENCOUNTER — Encounter: Payer: Self-pay | Admitting: Physician Assistant

## 2016-02-01 DIAGNOSIS — F329 Major depressive disorder, single episode, unspecified: Secondary | ICD-10-CM | POA: Diagnosis not present

## 2016-02-01 DIAGNOSIS — F32A Depression, unspecified: Secondary | ICD-10-CM

## 2016-02-01 MED ORDER — ESCITALOPRAM OXALATE 5 MG PO TABS: 5.0000 mg | ORAL_TABLET | Freq: Every day | ORAL | 3 refills | Status: DC

## 2016-02-01 NOTE — Progress Notes (Signed)
Patient: [name] Male    DOB: 1925-01-19   82 y.o.   MRN: [id number] Visit Date: 02/01/2016  Today's Provider: Mar Daring, PA-C   Chief Complaint  Patient presents with  . Depression    4 week follow up    Subjective:    HPI Patient is here for a follow up on depression. Patient was started on low dose Lexapro 5mg  about 4 weeks ago. Patient reports that he has been tolerating medication very well. He and his wife both mention that there has been a good change in him and they feel this medicine is working very well. Only side effect is slight daytime drowsiness. He reports it is not significant, however, and he still does most of what he wants to do.    Allergies  Allergen Reactions  . Toviaz [Fesoterodine Fumarate Er] Other (See Comments)    Acid reflux   Current Meds  Medication Sig  . acetaminophen (TYLENOL) 325 MG tablet Take 650 mg by mouth every 6 (six) hours as needed.  [name] amLODipine (NORVASC) 2.5 MG tablet TAKE ONE TABLET BY MOUTH ONCE DAILY  . aspirin 81 MG tablet Take 81 mg by mouth daily.  [name] escitalopram (LEXAPRO) 5 MG tablet Take 1 tablet (5 mg total) by mouth at bedtime.  [name] glucosamine-chondroitin 500-400 MG tablet Take 1 tablet by mouth 3 (three) times daily.  . hydrochlorothiazide (MICROZIDE) 12.5 MG capsule TAKE ONE CAPSULE BY MOUTH ONCE DAILY  . losartan (COZAAR) 25 MG tablet TAKE ONE TABLET BY MOUTH ONCE DAILY  . Multiple Vitamin (MULTIVITAMIN) capsule Take 1 capsule by mouth daily.  . Multiple Vitamins-Minerals (ZINC PO) Take by mouth.  [name] OVER THE COUNTER MEDICATION Take by mouth 1 day or 1 dose. ESTER C  . tamsulosin (FLOMAX) 0.4 MG CAPS capsule TAKE ONE CAPSULE BY MOUTH ONCE DAILY  . vitamin B-12 (CYANOCOBALAMIN) 1000 MCG tablet Take 1,000 mcg by mouth daily.  . Zinc 100 MG TABS Take 1 tablet by mouth daily.    Review of Systems  Constitutional: Negative.   Respiratory: Negative.   Cardiovascular: Negative.   Neurological:  Negative.   Psychiatric/Behavioral: Negative for behavioral problems, self-injury, sleep disturbance and suicidal ideas. The patient is not nervous/anxious.     Social History  Substance Use Topics  . Smoking status: Former Smoker    Years: 10.00    Quit date: 07/08/1962  . Smokeless tobacco: Never Used     Comment: quit 1964  . Alcohol use 3.0 oz/week    5 Glasses of wine per week     Comment: beer occasionally   Objective:   BP 132/68 (BP Location: Right Arm, Patient Position: Sitting, Cuff Size: Normal)   Pulse 80   Temp 98.6 F (37 C)   Resp 16   Wt 182 lb (82.6 kg)   BMI 26.49 kg/m   Physical Exam  Constitutional: He appears well-developed and well-nourished. No distress.  HENT:  Head: Normocephalic and atraumatic.  Cardiovascular: Normal rate, regular rhythm and normal heart sounds.  Exam reveals no gallop and no friction rub.   No murmur heard. Pulmonary/Chest: Effort normal and breath sounds normal. No respiratory distress. He has no wheezes. He has no rales.  Skin: He is not diaphoretic.  Psychiatric: He has a normal mood and affect. His behavior is normal. Judgment and thought content normal.  Vitals reviewed.     Assessment & Plan:     1. Depression Stable. Diagnosis pulled  for medication refill. Continue current medical treatment plan. I will see him back in 3 months to see if he wants to do a trial discontinuation of the medication or continue for a while longer. - escitalopram (LEXAPRO) 5 MG tablet; Take 1 tablet (5 mg total) by mouth at bedtime.  Dispense: 30 tablet; Refill: Mason, PA-C  [facility]

## 2016-02-01 NOTE — Patient Instructions (Signed)
Escitalopram tablets What is this medicine? ESCITALOPRAM (es sye TAL oh pram) is used to treat depression and certain types of anxiety. This medicine may be used for other purposes; ask your health care provider or pharmacist if you have questions. What should I tell my health care provider before I take this medicine? They need to know if you have any of these conditions: -bipolar disorder or a family history of bipolar disorder -diabetes -glaucoma -heart disease -kidney or liver disease -receiving electroconvulsive therapy -seizures (convulsions) -suicidal thoughts, plans, or attempt by you or a family member -an unusual or allergic reaction to escitalopram, the related drug citalopram, other medicines, foods, dyes, or preservatives -pregnant or trying to become pregnant -breast-feeding How should I use this medicine? Take this medicine by mouth with a glass of water. Follow the directions on the prescription label. You can take it with or without food. If it upsets your stomach, take it with food. Take your medicine at regular intervals. Do not take it more often than directed. Do not stop taking this medicine suddenly except upon the advice of your doctor. Stopping this medicine too quickly may cause serious side effects or your condition may worsen. A special MedGuide will be given to you by the pharmacist with each prescription and refill. Be sure to read this information carefully each time. Talk to your pediatrician regarding the use of this medicine in children. Special care may be needed. Overdosage: If you think you have taken too much of this medicine contact a poison control center or emergency room at once. NOTE: This medicine is only for you. Do not share this medicine with others. What if I miss a dose? If you miss a dose, take it as soon as you can. If it is almost time for your next dose, take only that dose. Do not take double or extra doses. What may interact with this  medicine? Do not take this medicine with any of the following medications: -certain medicines for fungal infections like fluconazole, itraconazole, ketoconazole, posaconazole, voriconazole -cisapride -citalopram -dofetilide -dronedarone -linezolid -MAOIs like Carbex, Eldepryl, Marplan, Nardil, and Parnate -methylene blue (injected into a vein) -pimozide -thioridazine -ziprasidone This medicine may also interact with the following medications: -alcohol -aspirin and aspirin-like medicines -carbamazepine -certain medicines for depression, anxiety, or psychotic disturbances -certain medicines for migraine headache like almotriptan, eletriptan, frovatriptan, naratriptan, rizatriptan, sumatriptan, zolmitriptan -certain medicines for sleep -certain medicines that treat or prevent blood clots like warfarin, enoxaparin, dalteparin -cimetidine -diuretics -fentanyl -furazolidone -isoniazid -lithium -metoprolol -NSAIDs, medicines for pain and inflammation, like ibuprofen or naproxen -other medicines that prolong the QT interval (cause an abnormal heart rhythm) -procarbazine -rasagiline -supplements like St. John's wort, kava kava, valerian -tramadol -tryptophan This list may not describe all possible interactions. Give your health care provider a list of all the medicines, herbs, non-prescription drugs, or dietary supplements you use. Also tell them if you smoke, drink alcohol, or use illegal drugs. Some items may interact with your medicine. What should I watch for while using this medicine? Tell your doctor if your symptoms do not get better or if they get worse. Visit your doctor or health care professional for regular checks on your progress. Because it may take several weeks to see the full effects of this medicine, it is important to continue your treatment as prescribed by your doctor. Patients and their families should watch out for new or worsening thoughts of suicide or  depression. Also watch out for sudden changes in feelings such  or health care professional for regular checks on your progress. Because it may take several weeks to see the full effects of this medicine, it is important to continue your treatment as prescribed by your doctor.  Patients and their families should watch out for new or worsening thoughts of suicide or  depression. Also watch out for sudden changes in feelings such as feeling anxious, agitated, panicky, irritable, hostile, aggressive, impulsive, severely restless, overly excited and hyperactive, or not being able to sleep. If this happens, especially at the beginning of treatment or after a change in dose, call your health care professional.  You may get drowsy or dizzy. Do not drive, use machinery, or do anything that needs mental alertness until you know how this medicine affects you. Do not stand or sit up quickly, especially if you are an older patient. This reduces the risk of dizzy or fainting spells. Alcohol may interfere with the effect of this medicine. Avoid alcoholic drinks.  Your mouth may get dry. Chewing sugarless gum or sucking hard candy, and drinking plenty of water may help. Contact your doctor if the problem does not go away or is severe.  What side effects may I notice from receiving this medicine?  Side effects that you should report to your doctor or health care professional as soon as possible:  -allergic reactions like skin rash, itching or hives, swelling of the face, lips, or tongue  -confusion  -feeling faint or lightheaded, falls  -fast talking and excited feelings or actions that are out of control  -hallucination, loss of contact with reality  -seizures  -suicidal thoughts or other mood changes  -unusual bleeding or bruising  Side effects that usually do not require medical attention (report to your doctor or health care professional if they continue or are bothersome):  -blurred vision  -changes in appetite  -change in sex drive or performance  -headache  -increased sweating  -nausea  This list may not describe all possible side effects. Call your doctor for medical advice about side effects. You may report side effects to FDA at 1-800-FDA-1088.  Where should I keep my medicine?  Keep out of reach of children.  Store at room temperature between 15 and 30 degrees C (59 and 86 degrees  F). Throw away any unused medicine after the expiration date.  NOTE: This sheet is a summary. It may not cover all possible information. If you have questions about this medicine, talk to your doctor, pharmacist, or health care provider.     © 2016, Elsevier/Gold Standard. (2013-01-19 12:32:55)

## 2016-02-14 ENCOUNTER — Other Ambulatory Visit: Payer: Self-pay | Admitting: Urology

## 2016-02-15 ENCOUNTER — Other Ambulatory Visit: Payer: Self-pay | Admitting: Urology

## 2016-02-15 NOTE — Telephone Encounter (Signed)
Pt needs to have prescription refilled for Tamsulosin.  He uses [facility]  Thanks.

## 2016-02-24 ENCOUNTER — Other Ambulatory Visit: Payer: Self-pay | Admitting: Family Medicine

## 2016-02-24 DIAGNOSIS — I10 Essential (primary) hypertension: Secondary | ICD-10-CM

## 2016-02-29 ENCOUNTER — Ambulatory Visit (INDEPENDENT_AMBULATORY_CARE_PROVIDER_SITE_OTHER): Payer: Medicare Other | Admitting: Urology

## 2016-02-29 ENCOUNTER — Encounter: Payer: Self-pay | Admitting: Urology

## 2016-02-29 VITALS — BP 115/71 | HR 68 | Ht 69.5 in | Wt 182.4 lb

## 2016-02-29 DIAGNOSIS — R350 Frequency of micturition: Secondary | ICD-10-CM

## 2016-02-29 LAB — PTNS-PERCUTANEOUS TIBIAL NERVE STIMULATION: Scan Result: 11

## 2016-02-29 NOTE — Progress Notes (Signed)
    10:32 AM   Trish Fountain Filippini Jan 10, 1925 OS:[id number]  Referring provider: Mar Daring, PA-C [place], [place] [address]  Chief Complaint  Patient presents with  . PTNS    1 month maintenance     HPI: Patient is an 82 year old Caucasian male who presents today for PTNS treatment for his urinary frequency. This will be a maintenance treatment.   Background history Mr. Unangst is an 82 year old white male who has a long-standing history of urinary frequency and dribbling. He cannot recall a specific event when his urinary symptoms started. He states he has a constant dribbling of a small amount of urine. This occurs all day. He has not experienced an increase in the dribbling with coughing, sneezing, laughing, change in position or bending and lifting.   Looking back at his initial PTNS treatment, he was having day time voids every hour, three nightly voids and always dribbling.  So, he has made significant progress.    After his last treatment, he is making 8 (stable) trips to the bathroom during the daytime. He states he gets up 3 (stable) times nightly. He is not experiencing burning or pain with urination. He has a mild urgency to urinate. He is having 0 (stable) episodes of incontinence daily.  He feels the treatments are beneficial and would like to continue the treatment.  He does not consume any caffeine beverages, he has 1-2 glasses of wine daily and he drinks eight 8 ounce glasses of water daily.    Previous Therapy: He has been tried on tamsulosin and Toviaz without improvement in his urinary symptoms.    Blood pressure 115/71, pulse 68, height 5' 9.5" (1.765 m), weight 182 lb 6.4 oz (82.7 kg).  He does not have any contraindications present for PTNS, such as: Pacemaker, Implantable defibrillator, History of abnormal bleeding or History of neuropathies or nerve damage.Contraindications present for PTNS   Discussed  with patient possible complications of procedure, such as discomfort, bleeding at insertion/stimulation site, procedure consent signed  Patient goals: The patient's goals were to reduce his urgency and his frequency.  PTNS treatment: The needle electrode was inserted into the lower, inner aspect of the patient's left leg. The surface electrode was placed on the inside arch of the foot on the treatment leg. The lead set was connected to the stimulator, and the needle electrode clip was connected to the needle electrode. The stimulator that produces an adjustable electrical pulse that travels to sacral nerve plexus via the tibial nerve was increased to 11 and a sensory response and toe flex was observed.   Treatment Plan:  Patient tolerated the PTNS treatment well for 30 minutes. The electrode was removed with out difficulty. He will return in one monthfor his maintenance PTNS treatment.

## 2016-03-04 ENCOUNTER — Other Ambulatory Visit: Payer: Self-pay

## 2016-03-04 NOTE — Telephone Encounter (Signed)
error 

## 2016-03-15 ENCOUNTER — Ambulatory Visit: Payer: Medicare Other | Admitting: Physician Assistant

## 2016-04-01 ENCOUNTER — Ambulatory Visit (INDEPENDENT_AMBULATORY_CARE_PROVIDER_SITE_OTHER): Payer: Medicare Other | Admitting: Urology

## 2016-04-01 VITALS — BP 170/67 | HR 75 | Ht 69.0 in | Wt 183.0 lb

## 2016-04-01 DIAGNOSIS — R350 Frequency of micturition: Secondary | ICD-10-CM | POA: Diagnosis not present

## 2016-04-01 NOTE — Progress Notes (Signed)
PTNS  Session # Maint. #11  Health & Social Factors: same Caffeine: 0 Alcohol: 1 Daytime voids #per day: 8 Night-time voids #per night: 3 Urgency: mild Incontinence Episodes #per day: 0 Ankle used: Left Treatment Setting: 12 Feeling/ Response: Sensory Comments: none  Preformed By: Zara Council, PAC  Assistant: Fonnie Jarvis, CMA  Follow Up: 1 month

## 2016-04-05 ENCOUNTER — Encounter: Payer: Self-pay | Admitting: Urology

## 2016-04-05 NOTE — Progress Notes (Signed)
7:56 PM   [name] 12-20-1924 [id number]  Referring provider: Mar Daring, PA-C [place], [place] [address]  Chief Complaint  Patient presents with  . Urinary Frequency    PTNS 1 month    HPI: Patient is an 82 year old Caucasian male who presents today for PTNS treatment for his urinary frequency. This will be a maintenance treatment.   Background history [name] is an 82 year old white male who has a long-standing history of urinary frequency and dribbling. He cannot recall a specific event when his urinary symptoms started. He states he has a constant dribbling of a small amount of urine. This occurs all day. He has not experienced an increase in the dribbling with coughing, sneezing, laughing, change in position or bending and lifting.   Looking back at his initial PTNS treatment, he was having day time voids every hour, three nightly voids and always dribbling.  So, he has made significant progress.    After his last treatment, he is making 8 (stable) trips to the bathroom during the daytime. He states he gets up 3 (stable) times nightly. He is not experiencing burning or pain with urination. He has a mild urgency to urinate. He is having 0 (stable) episodes of incontinence daily.  He feels the treatments are beneficial and would like to continue the treatment.  He does not consume any caffeine beverages, he has 1-2 glasses of wine daily and he drinks eight 8 ounce glasses of water daily.    Previous Therapy: He has been tried on tamsulosin and Toviaz without improvement in his urinary symptoms.    Blood pressure 115/71, pulse 68, height 5' 9.5" (1.765 m), weight 182 lb 6.4 oz (82.7 kg).  He does not have any contraindications present for PTNS, such as: Pacemaker, Implantable defibrillator, History of abnormal bleeding or History of neuropathies or nerve damage.Contraindications present for PTNS   Discussed  with patient possible complications of procedure, such as discomfort, bleeding at insertion/stimulation site, procedure consent signed  Patient goals: The patient's goals were to reduce his urgency and his frequency.  ROS UROLOGY Frequent Urination?: Yes Hard to postpone urination?: No Burning/pain with urination?: No Get up at night to urinate?: Yes Leakage of urine?: Yes Urine stream starts and stops?: No Trouble starting stream?: No Do you have to strain to urinate?: No Blood in urine?: No Urinary tract infection?: No Sexually transmitted disease?: No Injury to kidneys or bladder?: No Painful intercourse?: No Weak stream?: No Erection problems?: Yes Penile pain?: No Gastrointestinal Nausea?: No Vomiting?: No Indigestion/heartburn?: No Diarrhea?: No Constipation?: No Constitutional Fever: No Night sweats?: No Weight loss?: No Fatigue?: No Skin Skin rash/lesions?: No Itching?: No Eyes Blurred vision?: No Double vision?: No Ears/Nose/Throat Sore throat?: No Sinus problems?: No Hematologic/Lymphatic Swollen glands?: No Easy bruising?: No Cardiovascular Leg swelling?: Yes Chest pain?: No Respiratory Cough?: No Shortness of breath?: No Endocrine Excessive thirst?: No Musculoskeletal Back pain?: No Joint pain?: No Neurological Headaches?: No Dizziness?: No Psychologic Depression?: No Anxiety?: No    PTNS treatment: The needle electrode was inserted into the lower, inner aspect of the patient's left leg. The surface electrode was placed on the inside arch of the foot on the treatment leg. The lead set was connected to the stimulator, and the needle electrode clip was connected to the needle electrode. The stimulator that produces an adjustable electrical pulse that travels to sacral nerve plexus via the tibial nerve was increased to 12 and  a sensory response and toe flex was observed.   Treatment Plan:  Patient tolerated the PTNS treatment well for  30 minutes. The electrode was removed with out difficulty. He will return in one monthfor his maintenance PTNS treatment.

## 2016-04-21 ENCOUNTER — Other Ambulatory Visit: Payer: Self-pay | Admitting: Family Medicine

## 2016-04-21 DIAGNOSIS — E7800 Pure hypercholesterolemia, unspecified: Secondary | ICD-10-CM

## 2016-04-21 DIAGNOSIS — I10 Essential (primary) hypertension: Secondary | ICD-10-CM

## 2016-04-25 ENCOUNTER — Other Ambulatory Visit: Payer: Self-pay | Admitting: Urology

## 2016-04-29 ENCOUNTER — Encounter: Payer: Self-pay | Admitting: Urology

## 2016-04-29 ENCOUNTER — Ambulatory Visit (INDEPENDENT_AMBULATORY_CARE_PROVIDER_SITE_OTHER): Payer: Medicare Other | Admitting: Urology

## 2016-04-29 VITALS — BP 144/56 | HR 82 | Ht 69.0 in | Wt 185.0 lb

## 2016-04-29 DIAGNOSIS — R32 Unspecified urinary incontinence: Secondary | ICD-10-CM

## 2016-04-29 DIAGNOSIS — C61 Malignant neoplasm of prostate: Secondary | ICD-10-CM

## 2016-04-29 LAB — PTNS-PERCUTANEOUS TIBIAL NERVE STIMULATION: SCAN RESULT: 19

## 2016-04-29 NOTE — Progress Notes (Signed)
10:19 AM   [name] 1925/06/13 [id number]  Referring provider: Mar Daring, PA-C [place],  [address]  Chief Complaint  Patient presents with  . Urinary Frequency    PTNS    HPI: Patient is an 82 year old Caucasian male who presents today for PTNS treatment for his urinary frequency. This will be a maintenance treatment.   Background history Mr. Bonfante has a long-standing history of urinary frequency and dribbling. He cannot recall a specific event when his urinary symptoms started. He states he has a constant dribbling of a small amount of urine. This occurs all day. He has not experienced an increase in the dribbling with coughing, sneezing, laughing, change in position or bending and lifting.   Looking back at his initial PTNS treatment, he was having day time voids every hour, three nightly voids and always dribbling.  So, he has made significant progress.    After his last treatment, he is making 8 (stable) trips to the bathroom during the daytime. He states he gets up 1- 3 (stable) times nightly. He is not experiencing burning or pain with urination. He has a mild urgency to urinate. He is having 0 (stable) episodes of incontinence daily.  He feels the treatments are beneficial and would like to continue the treatment.  He does not consume any caffeine beverages, he has 1-2 glasses of wine daily and he drinks eight 8 ounce glasses of water daily.    Previous Therapy: He has been tried on tamsulosin and Toviaz without improvement in his urinary symptoms.    Blood pressure (!) 144/56, pulse 82, height 5\' 9"  (1.753 m), weight 185 lb (83.9 kg).  He does not have any contraindications present for PTNS, such as: Pacemaker, Implantable defibrillator, History of abnormal bleeding or History of neuropathies or nerve damage.Contraindications present for PTNS   Discussed with patient possible complications of  procedure, such as discomfort, bleeding at insertion/stimulation site, procedure consent signed  Patient goals: The patient's goals were to reduce his urgency and his frequency.  ROS UROLOGY Frequent Urination?: Yes Hard to postpone urination?: No Burning/pain with urination?: No Get up at night to urinate?: Yes Leakage of urine?: Yes Urine stream starts and stops?: No Trouble starting stream?: No Do you have to strain to urinate?: No Blood in urine?: No Urinary tract infection?: No Sexually transmitted disease?: No Injury to kidneys or bladder?: No Painful intercourse?: No Weak stream?: No Erection problems?: No Penile pain?: No Gastrointestinal Nausea?: No Vomiting?: No Indigestion/heartburn?: No Diarrhea?: No Constipation?: No Constitutional Fever: No Night sweats?: No Weight loss?: No Fatigue?: No Skin Skin rash/lesions?: No Itching?: No Eyes Blurred vision?: No Double vision?: No Ears/Nose/Throat Sore throat?: No Sinus problems?: No Hematologic/Lymphatic Swollen glands?: No Easy bruising?: No Cardiovascular Leg swelling?: No Chest pain?: No Respiratory Cough?: No Shortness of breath?: No Endocrine Excessive thirst?: No Musculoskeletal Back pain?: No Joint pain?: No Neurological Headaches?: No Dizziness?: No Psychologic Depression?: No Anxiety?: No  1. Urinary incontinence  PTNS treatment:   The needle electrode was inserted into the lower, inner aspect of the patient's left leg. The surface electrode was  placed on the inside arch of the foot on the treatment leg. The lead set was connected to the stimulator, and the needle  electrode clip was connected to the needle electrode. The stimulator that produces an adjustable electrical pulse that travels  to sacral nerve plexus via the tibial nerve was increased to 19 and a sensory response  and toe flex was observed.   Treatment Plan:  Patient tolerated the PTNS treatment well for 30 minutes.  The electrode was removed with out difficulty. He will return in one monthfor his maintenance PTNS treatment.

## 2016-04-30 LAB — PSA: Prostate Specific Ag, Serum: 0.6 ng/mL (ref 0.0–4.0)

## 2016-05-02 DIAGNOSIS — Z23 Encounter for immunization: Secondary | ICD-10-CM | POA: Diagnosis not present

## 2016-05-03 ENCOUNTER — Ambulatory Visit: Payer: Medicare Other | Admitting: Physician Assistant

## 2016-05-06 ENCOUNTER — Encounter: Payer: Self-pay | Admitting: Urology

## 2016-05-06 ENCOUNTER — Ambulatory Visit (INDEPENDENT_AMBULATORY_CARE_PROVIDER_SITE_OTHER): Payer: Medicare Other | Admitting: Urology

## 2016-05-06 VITALS — BP 173/88 | HR 64 | Ht 69.0 in | Wt 183.4 lb

## 2016-05-06 DIAGNOSIS — C61 Malignant neoplasm of prostate: Secondary | ICD-10-CM

## 2016-05-06 NOTE — Progress Notes (Signed)
05/06/2016 11:10 AM   [name] 16-Dec-1924 [id number]  Referring provider: Mar Daring, PA-C [place], [place] [address]  Chief Complaint  Patient presents with  . Urinary Frequency    HPI: Patient is an 82 year old Caucasian male who presents today for a 6 month follow up for prostate cancer.  Prostate cancer Patient who underwent cryotherapy for a Gleason's 6 (10 cores + out of 12)-invasion present with a PSA of 4.3 and a 11% free PSA PCa in 2009 with [name] presents today for a 6 month follow up.  He has not had any weight loss, bone pain or appetite loss.  He generally feels well.  He has not had any fevers, chills, nauseas or vomiting.  He has not had any dysuria, gross hematuria or suprapubic pain.  His IPSS score is 8/3.  His major complaints are urgency and nocturia x 2.        IPSS    Row Name 05/06/16 1000         International Prostate Symptom Score   How often have you had the sensation of not emptying your bladder? Almost always     How often have you had to urinate less than every two hours? Not at All     How often have you found you stopped and started again several times when you urinated? Not at All     How often have you found it difficult to postpone urination? Less than 1 in 5 times     How often have you had a weak urinary stream? Not at All     How often have you had to strain to start urination? Not at All     How many times did you typically get up at night to urinate? 2 Times     Total IPSS Score 8       Quality of Life due to urinary symptoms   If you were to spend the rest of your life with your urinary condition just the way it is now how would you feel about that? Mixed        Score:  1-7 Mild 8-19 Moderate 20-35 Severe    PMH: Past Medical History:  Diagnosis Date  . Arthritis   . Bladder outlet obstruction   . Erectile dysfunction   . GERD (gastroesophageal reflux disease)   . Hard of  hearing   . HBP (high blood pressure)   . History of nephrolithiasis   . HLD (hyperlipidemia)   . Incomplete bladder emptying   . Nocturia   . Osteoporosis   . Prostate cancer ([facility])   . Shingles 2014  . Sinus problem   . Stomach ulcer   . Stroke ([facility]) 2012  . Urinary frequency   . Urinary leakage     Surgical History: Past Surgical History:  Procedure Laterality Date  . CATARACT EXTRACTION  2000, 2001  . CHOLECYSTECTOMY  1972  . PROSTATE SURGERY  2008/2009  . TONSILLECTOMY    . UMBILICAL HERNIA REPAIR      Home Medications:    Medication List       Accurate as of 05/06/16 11:10 AM. Always use your most recent med list.          acetaminophen 325 MG tablet Commonly known as:  TYLENOL Take 650 mg by mouth every 6 (six) hours as needed.   amLODipine 2.5 MG tablet Commonly known as:  NORVASC TAKE ONE  TABLET BY MOUTH ONCE DAILY   aspirin 81 MG tablet Take 81 mg by mouth daily.   escitalopram 5 MG tablet Commonly known as:  LEXAPRO Take 1 tablet (5 mg total) by mouth at bedtime.   glucosamine-chondroitin 500-400 MG tablet Take 1 tablet by mouth 3 (three) times daily.   hydrochlorothiazide 12.5 MG capsule Commonly known as:  MICROZIDE TAKE ONE CAPSULE BY MOUTH ONCE DAILY   losartan 25 MG tablet Commonly known as:  COZAAR TAKE ONE TABLET BY MOUTH ONCE DAILY   multivitamin capsule Take 1 capsule by mouth daily.   OVER THE COUNTER MEDICATION Take by mouth 1 day or 1 dose. ESTER C   tamsulosin 0.4 MG Caps capsule Commonly known as:  FLOMAX TAKE ONE CAPSULE BY MOUTH ONCE DAILY   tamsulosin 0.4 MG Caps capsule Commonly known as:  FLOMAX TAKE ONE CAPSULE BY MOUTH ONCE DAILY   vitamin B-12 1000 MCG tablet Commonly known as:  CYANOCOBALAMIN Take 1,000 mcg by mouth daily.   VITAMIN C PO Take by mouth.   ZINC PO Take by mouth.       Allergies:  Allergies  Allergen Reactions  . Toviaz [Fesoterodine Fumarate Er] Other (See Comments)    Acid  reflux    Family History: Family History  Problem Relation Age of Onset  . Cancer Mother   . Stroke Father   . Stroke Sister   . Prostate cancer Brother   . Kidney disease Neg Hx     Social History:  reports that he quit smoking about 53 years ago. He quit after 10.00 years of use. He has never used smokeless tobacco. He reports that he drinks about 3.0 oz of alcohol per week . He reports that he does not use drugs.  ROS: UROLOGY Frequent Urination?: No Hard to postpone urination?: No Burning/pain with urination?: No Get up at night to urinate?: No Leakage of urine?: No Urine stream starts and stops?: No Trouble starting stream?: No Do you have to strain to urinate?: No Blood in urine?: No Urinary tract infection?: No Sexually transmitted disease?: No Injury to kidneys or bladder?: No Painful intercourse?: No Weak stream?: No Erection problems?: No Penile pain?: No  Gastrointestinal Nausea?: No Vomiting?: No Indigestion/heartburn?: No Diarrhea?: No Constipation?: No  Constitutional Fever: No Night sweats?: No Weight loss?: No Fatigue?: No  Skin Skin rash/lesions?: No Itching?: No  Eyes Blurred vision?: No Double vision?: No  Ears/Nose/Throat Sore throat?: No Sinus problems?: No  Hematologic/Lymphatic Swollen glands?: No Easy bruising?: No  Cardiovascular Leg swelling?: No Chest pain?: No  Respiratory Cough?: No Shortness of breath?: No  Endocrine Excessive thirst?: No  Musculoskeletal Back pain?: No Joint pain?: No  Neurological Headaches?: No Dizziness?: No  Psychologic Depression?: No Anxiety?: No  Physical Exam: BP (!) 173/88 (BP Location: Left Arm, Patient Position: Sitting, Cuff Size: Normal)   Pulse 64   Ht 5\' 9"  (1.753 m)   Wt 183 lb 6.4 oz (83.2 kg)   BMI 27.08 kg/m   Constitutional: Well nourished. Alert and oriented, No acute distress. HEENT: [place] AT, moist mucus membranes. Trachea midline, no  masses. Cardiovascular: No clubbing, cyanosis, or edema. Respiratory: Normal respiratory effort, no increased work of breathing. GI: Abdomen is soft, non tender, non distended, no abdominal masses. Liver and spleen not palpable.  No hernias appreciated.  Stool sample for occult testing is not indicated.   GU: No CVA tenderness.  No bladder fullness or masses.  Patient with circumcised phallus.  Urethral meatus  is patent.  No penile discharge. No penile lesions or rashes. Scrotum without lesions, cysts, rashes and/or edema.  Testicles are located scrotally bilaterally. No masses are appreciated in the testicles. Left and right epididymis are normal. Rectal: Patient with  normal sphincter tone. Anus and perineum without scarring or rashes. No rectal masses are appreciated. Prostate is small, firm, no nodules are appreciated. Seminal vesicles are normal. Skin: No rashes, bruises or suspicious lesions. Lymph: No cervical or inguinal adenopathy. Neurologic: Grossly intact, no focal deficits, moving all 4 extremities. Psychiatric: Normal mood and affect.  Laboratory Data: PSA History  0.6 ng/mL on 04/29/2016  Lab Results  Component Value Date   WBC 4.9 09/15/2015   HGB 14.6 04/25/2012   HCT 41.5 09/15/2015   MCV 92 09/15/2015   PLT 224 09/15/2015    Lab Results  Component Value Date   CREATININE 0.86 09/15/2015    Lab Results  Component Value Date   HGBA1C 6.0 (H) 09/15/2015       Component Value Date/Time   CHOL 147 09/15/2015 1043   HDL 59 09/15/2015 1043   LDLCALC 74 09/15/2015 1043    Lab Results  Component Value Date   AST 26 09/15/2015   Lab Results  Component Value Date   ALT 17 09/15/2015    Assessment & Plan:    1. Prostate cancer: Patient underwent cryotherapy for a Gleason's 6 (10 cores + out of 12)-invasion present with a PSA of 4.3 and a 11% free PSA PCa in 2009. His most recent PSA was 0.6 ng/mL on 11/20/2015.   IPSS is stable.  He will return in 6 months  for a repeated IPSS score, PSA and exam.     Return in about 6 months (around 11/04/2016) for IPSS, PSA and exam.  These notes generated with voice recognition software. I apologize for typographical errors.  Zara Council, [facility] [address] [place], [place] [address] [phone]

## 2016-05-09 ENCOUNTER — Ambulatory Visit (INDEPENDENT_AMBULATORY_CARE_PROVIDER_SITE_OTHER): Payer: Medicare Other | Admitting: Physician Assistant

## 2016-05-09 ENCOUNTER — Encounter: Payer: Self-pay | Admitting: Physician Assistant

## 2016-05-09 VITALS — BP 130/70 | HR 64 | Temp 97.6°F | Resp 16 | Wt 187.0 lb

## 2016-05-09 DIAGNOSIS — R7309 Other abnormal glucose: Secondary | ICD-10-CM | POA: Diagnosis not present

## 2016-05-09 DIAGNOSIS — F3342 Major depressive disorder, recurrent, in full remission: Secondary | ICD-10-CM | POA: Diagnosis not present

## 2016-05-09 LAB — POCT GLYCOSYLATED HEMOGLOBIN (HGB A1C)
ESTIMATED AVERAGE GLUCOSE: 123
HEMOGLOBIN A1C: 5.9

## 2016-05-09 MED ORDER — ESCITALOPRAM OXALATE 5 MG PO TABS: 5.0000 mg | ORAL_TABLET | Freq: Every day | ORAL | 5 refills | Status: DC

## 2016-05-09 NOTE — Progress Notes (Signed)
Patient: [name] Male    DOB: April 29, 1925   82 y.o.   MRN: [id number] Visit Date: 05/09/2016  Today's Provider: Mar Daring, PA-C   Chief Complaint  Patient presents with  . Depression   Subjective:    HPI  Depression, Follow-up  He  was last seen for this 3 months ago. Changes made at last visit include no changes.   He reports excellent compliance with treatment. He is not having side effects.   He reports excellent tolerance of treatment. Current symptoms include: none He feels he is Improved since last visit.  He would also like to have his A1c rechecked as well. At last check it had been 6.0. He had been higher when he had started a statin and has been trying to eat better and limit sugars.  ------------------------------------------------------------------------     Allergies  Allergen Reactions  . Toviaz [Fesoterodine Fumarate Er] Other (See Comments)    Acid reflux     Current Outpatient Prescriptions:  .  acetaminophen (TYLENOL) 325 MG tablet, Take 650 mg by mouth every 6 (six) hours as needed., Disp: , Rfl:  .  amLODipine (NORVASC) 2.5 MG tablet, TAKE ONE TABLET BY MOUTH ONCE DAILY, Disp: 90 tablet, Rfl: 1 .  Ascorbic Acid (VITAMIN C PO), Take by mouth., Disp: , Rfl:  .  aspirin 81 MG tablet, Take 81 mg by mouth daily., Disp: , Rfl:  .  escitalopram (LEXAPRO) 5 MG tablet, Take 1 tablet (5 mg total) by mouth at bedtime., Disp: 30 tablet, Rfl: 3 .  glucosamine-chondroitin 500-400 MG tablet, Take 1 tablet by mouth 3 (three) times daily., Disp: , Rfl:  .  hydrochlorothiazide (MICROZIDE) 12.5 MG capsule, TAKE ONE CAPSULE BY MOUTH ONCE DAILY, Disp: 90 capsule, Rfl: 1 .  losartan (COZAAR) 25 MG tablet, TAKE ONE TABLET BY MOUTH ONCE DAILY, Disp: 90 tablet, Rfl: 1 .  Multiple Vitamin (MULTIVITAMIN) capsule, Take 1 capsule by mouth daily., Disp: , Rfl:  .  OVER THE COUNTER MEDICATION, Take by mouth 1 day or 1 dose. ESTER C, Disp: , Rfl:  .   tamsulosin (FLOMAX) 0.4 MG CAPS capsule, TAKE ONE CAPSULE BY MOUTH ONCE DAILY, Disp: 30 capsule, Rfl: 0 .  vitamin B-12 (CYANOCOBALAMIN) 1000 MCG tablet, Take 1,000 mcg by mouth daily., Disp: , Rfl:   Review of Systems  Constitutional: Negative.   Respiratory: Negative.   Cardiovascular: Negative.   Gastrointestinal: Negative.   Neurological: Negative.   Psychiatric/Behavioral: Negative.     Social History  Substance Use Topics  . Smoking status: Former Smoker    Years: 10.00    Quit date: 07/08/1962  . Smokeless tobacco: Never Used     Comment: quit 1964  . Alcohol use 3.0 oz/week    5 Glasses of wine per week     Comment: beer occasionally   Objective:   BP 130/70 (BP Location: Left Arm, Patient Position: Sitting, Cuff Size: Large)   Pulse 64   Temp 97.6 F (36.4 C) (Oral)   Resp 16   Wt 187 lb (84.8 kg)   SpO2 96%   BMI 27.62 kg/m   Physical Exam  Constitutional: He appears well-developed and well-nourished. No distress.  HENT:  Head: Normocephalic and atraumatic.  Neck: Normal range of motion. Neck supple.  Cardiovascular: Normal rate, regular rhythm and normal heart sounds.  Exam reveals no gallop and no friction rub.   No murmur heard. Pulmonary/Chest: Effort normal and breath sounds normal.  No respiratory distress. He has no wheezes. He has no rales.  Skin: He is not diaphoretic.  Psychiatric: He has a normal mood and affect. His behavior is normal. Judgment and thought content normal.  Vitals reviewed.      Assessment & Plan:     1. Recurrent major depressive disorder, in full remission ([place]) Stable. Diagnosis pulled for medication refill. Continue current medical treatment plan. - escitalopram (LEXAPRO) 5 MG tablet; Take 1 tablet (5 mg total) by mouth at bedtime.  Dispense: 30 tablet; Refill: 5  2. Elevated hemoglobin A1c A1c is improving and is now 5.9. Continue lifestyle modifications and I will see him back in March 2018 for AWV/CPE. - POCT HgB  A1C       Mar Daring, PA-C  [facility]

## 2016-05-09 NOTE — Patient Instructions (Signed)
Escitalopram tablets What is this medicine? ESCITALOPRAM (es sye TAL oh pram) is used to treat depression and certain types of anxiety. This medicine may be used for other purposes; ask your health care provider or pharmacist if you have questions. What should I tell my health care provider before I take this medicine? They need to know if you have any of these conditions: -bipolar disorder or a family history of bipolar disorder -diabetes -glaucoma -heart disease -kidney or liver disease -receiving electroconvulsive therapy -seizures (convulsions) -suicidal thoughts, plans, or attempt by you or a family member -an unusual or allergic reaction to escitalopram, the related drug citalopram, other medicines, foods, dyes, or preservatives -pregnant or trying to become pregnant -breast-feeding How should I use this medicine? Take this medicine by mouth with a glass of water. Follow the directions on the prescription label. You can take it with or without food. If it upsets your stomach, take it with food. Take your medicine at regular intervals. Do not take it more often than directed. Do not stop taking this medicine suddenly except upon the advice of your doctor. Stopping this medicine too quickly may cause serious side effects or your condition may worsen. A special MedGuide will be given to you by the pharmacist with each prescription and refill. Be sure to read this information carefully each time. Talk to your pediatrician regarding the use of this medicine in children. Special care may be needed. Overdosage: If you think you have taken too much of this medicine contact a poison control center or emergency room at once. NOTE: This medicine is only for you. Do not share this medicine with others. What if I miss a dose? If you miss a dose, take it as soon as you can. If it is almost time for your next dose, take only that dose. Do not take double or extra doses. What may interact with this  medicine? Do not take this medicine with any of the following medications: -certain medicines for fungal infections like fluconazole, itraconazole, ketoconazole, posaconazole, voriconazole -cisapride -citalopram -dofetilide -dronedarone -linezolid -MAOIs like Carbex, Eldepryl, Marplan, Nardil, and Parnate -methylene blue (injected into a vein) -pimozide -thioridazine -ziprasidone This medicine may also interact with the following medications: -alcohol -aspirin and aspirin-like medicines -carbamazepine -certain medicines for depression, anxiety, or psychotic disturbances -certain medicines for migraine headache like almotriptan, eletriptan, frovatriptan, naratriptan, rizatriptan, sumatriptan, zolmitriptan -certain medicines for sleep -certain medicines that treat or prevent blood clots like warfarin, enoxaparin, dalteparin -cimetidine -diuretics -fentanyl -furazolidone -isoniazid -lithium -metoprolol -NSAIDs, medicines for pain and inflammation, like ibuprofen or naproxen -other medicines that prolong the QT interval (cause an abnormal heart rhythm) -procarbazine -rasagiline -supplements like St. John's wort, kava kava, valerian -tramadol -tryptophan This list may not describe all possible interactions. Give your health care provider a list of all the medicines, herbs, non-prescription drugs, or dietary supplements you use. Also tell them if you smoke, drink alcohol, or use illegal drugs. Some items may interact with your medicine. What should I watch for while using this medicine? Tell your doctor if your symptoms do not get better or if they get worse. Visit your doctor or health care professional for regular checks on your progress. Because it may take several weeks to see the full effects of this medicine, it is important to continue your treatment as prescribed by your doctor. Patients and their families should watch out for new or worsening thoughts of suicide or  depression. Also watch out for sudden changes in feelings such  or health care professional for regular checks on your progress. Because it may take several weeks to see the full effects of this medicine, it is important to continue your treatment as prescribed by your doctor.  Patients and their families should watch out for new or worsening thoughts of suicide or  depression. Also watch out for sudden changes in feelings such as feeling anxious, agitated, panicky, irritable, hostile, aggressive, impulsive, severely restless, overly excited and hyperactive, or not being able to sleep. If this happens, especially at the beginning of treatment or after a change in dose, call your health care professional.  You may get drowsy or dizzy. Do not drive, use machinery, or do anything that needs mental alertness until you know how this medicine affects you. Do not stand or sit up quickly, especially if you are an older patient. This reduces the risk of dizzy or fainting spells. Alcohol may interfere with the effect of this medicine. Avoid alcoholic drinks.  Your mouth may get dry. Chewing sugarless gum or sucking hard candy, and drinking plenty of water may help. Contact your doctor if the problem does not go away or is severe.  What side effects may I notice from receiving this medicine?  Side effects that you should report to your doctor or health care professional as soon as possible:  -allergic reactions like skin rash, itching or hives, swelling of the face, lips, or tongue  -confusion  -feeling faint or lightheaded, falls  -fast talking and excited feelings or actions that are out of control  -hallucination, loss of contact with reality  -seizures  -suicidal thoughts or other mood changes  -unusual bleeding or bruising  Side effects that usually do not require medical attention (report to your doctor or health care professional if they continue or are bothersome):  -blurred vision  -changes in appetite  -change in sex drive or performance  -headache  -increased sweating  -nausea  This list may not describe all possible side effects. Call your doctor for medical advice about side effects. You may report side effects to FDA at 1-800-FDA-1088.  Where should I keep my medicine?  Keep out of reach of children.  Store at room temperature between 15 and 30 degrees C (59 and 86 degrees  F). Throw away any unused medicine after the expiration date.  NOTE: This sheet is a summary. It may not cover all possible information. If you have questions about this medicine, talk to your doctor, pharmacist, or health care provider.     © 2016, Elsevier/Gold Standard. (2013-01-19 12:32:55)

## 2016-06-01 ENCOUNTER — Other Ambulatory Visit: Payer: Self-pay | Admitting: Urology

## 2016-06-03 ENCOUNTER — Ambulatory Visit (INDEPENDENT_AMBULATORY_CARE_PROVIDER_SITE_OTHER): Payer: Medicare Other | Admitting: Urology

## 2016-06-03 ENCOUNTER — Encounter: Payer: Self-pay | Admitting: Urology

## 2016-06-03 VITALS — BP 162/79 | HR 68 | Ht 69.0 in | Wt 183.7 lb

## 2016-06-03 DIAGNOSIS — R32 Unspecified urinary incontinence: Secondary | ICD-10-CM

## 2016-06-03 NOTE — Progress Notes (Signed)
11:42 AM   [name] Jul 20, 1924 [id number]  Referring provider: Mar Daring, PA-C [place], [place] [address]  Chief Complaint  Patient presents with  . PTNS    HPI: Patient is an 82 year old Caucasian male who presents today for PTNS treatment for his urinary frequency. This will be a maintenance treatment.   Background history [name] has a long-standing history of urinary frequency and dribbling. He cannot recall a specific event when his urinary symptoms started. He states he has a constant dribbling of a small amount of urine. This occurs all day. He has not experienced an increase in the dribbling with coughing, sneezing, laughing, change in position or bending and lifting.   Looking back at his initial PTNS treatment, he was having day time voids every hour, three nightly voids and always dribbling.  So, he has made significant progress.    After his last treatment, he is making 8 (stable) trips to the bathroom during the daytime. He states he gets up 2 (improvement) times nightly. He is not experiencing burning or pain with urination. He has a mild urgency to urinate. He is having 0 (stable) episodes of incontinence daily.  He feels the treatments are beneficial and would like to continue the treatment.  He does not consume any caffeine beverages, he has 1-2 glasses of wine daily and he drinks eight 8 ounce glasses of water daily.    Previous Therapy: He has been tried on tamsulosin and Toviaz without improvement in his urinary symptoms.    There were no vitals taken for this visit.  He does not have any contraindications present for PTNS, such as: Pacemaker, Implantable defibrillator, History of abnormal bleeding or History of neuropathies or nerve damage.Contraindications present for PTNS   Discussed with patient possible complications of procedure, such as discomfort, bleeding at insertion/stimulation  site, procedure consent signed  Patient goals: The patient's goals were to reduce his urgency and his frequency.   ROS UROLOGY Frequent Urination?: Yes Hard to postpone urination?: No Burning/pain with urination?: No Get up at night to urinate?: Yes Leakage of urine?: No Urine stream starts and stops?: No Trouble starting stream?: No Do you have to strain to urinate?: No Blood in urine?: No Urinary tract infection?: No Sexually transmitted disease?: No Injury to kidneys or bladder?: No Painful intercourse?: No Weak stream?: No Erection problems?: No Penile pain?: No Gastrointestinal Nausea?: No Vomiting?: No Indigestion/heartburn?: No Diarrhea?: No Constipation?: No Constitutional Fever: No Night sweats?: No Weight loss?: No Fatigue?: No Skin Skin rash/lesions?: No Itching?: No Eyes Blurred vision?: No Double vision?: No Ears/Nose/Throat Sore throat?: No Sinus problems?: Yes Hematologic/Lymphatic Swollen glands?: No Easy bruising?: No Cardiovascular Leg swelling?: No Chest pain?: No Respiratory Cough?: No Shortness of breath?: No Endocrine Excessive thirst?: No Musculoskeletal Back pain?: No Joint pain?: No Neurological Headaches?: No Dizziness?: No Psychologic Depression?: No Anxiety?: No   Blood pressure (!) 162/79, pulse 68, height 5\' 9"  (1.753 m), weight 183 lb 11.2 oz (83.3 kg). Constitutional: Well nourished. Alert and oriented, No acute distress. HEENT: [place] AT, moist mucus membranes. Trachea midline, no masses. Cardiovascular: No clubbing, cyanosis, or edema. Respiratory: Normal respiratory effort, no increased work of breathing. Skin: No rashes, bruises or suspicious lesions. Lymph: No cervical or inguinal adenopathy. Neurologic: Grossly intact, no focal deficits, moving all 4 extremities. Psychiatric: Normal mood and affect.   1. Urinary incontinence  PTNS treatment:   The needle electrode was inserted into the lower, inner  aspect of the patient's left leg. The surface electrode was  placed on the inside arch of the foot on the treatment leg. The lead set was connected to the stimulator, and the needle  electrode clip was connected to the needle electrode. The stimulator that produces an adjustable electrical pulse that travels  to sacral nerve plexus via the tibial nerve was increased to 9 and a sensory response and toe flex was observed.   Treatment Plan:  Patient tolerated the PTNS treatment well for 30 minutes. The electrode was removed with out difficulty. He will return in one monthfor his maintenance PTNS treatment.

## 2016-06-03 NOTE — Progress Notes (Signed)
PTNS  Session # 13   Health & Social Factors: no change Caffeine: 0 Alcohol: 0 Daytime voids #per day: 8 Night-time voids #per night: 2 Urgency: none Incontinence Episodes #per day: 0 Ankle used: left Treatment Setting: 9 Feeling/ Response: toe Comments: Patient tolerated well  Preformed By: Zara Council, PA-C  Assistant: Elberta Leatherwood, CMA  Follow Up: 1 month

## 2016-06-17 DIAGNOSIS — H43813 Vitreous degeneration, bilateral: Secondary | ICD-10-CM | POA: Diagnosis not present

## 2016-07-04 ENCOUNTER — Other Ambulatory Visit: Payer: Self-pay | Admitting: Urology

## 2016-07-04 DIAGNOSIS — N401 Enlarged prostate with lower urinary tract symptoms: Secondary | ICD-10-CM

## 2016-07-11 ENCOUNTER — Ambulatory Visit (INDEPENDENT_AMBULATORY_CARE_PROVIDER_SITE_OTHER): Payer: Medicare Other | Admitting: Urology

## 2016-07-11 ENCOUNTER — Encounter: Payer: Self-pay | Admitting: Urology

## 2016-07-11 ENCOUNTER — Ambulatory Visit: Payer: Medicare Other | Admitting: Urology

## 2016-07-11 VITALS — BP 159/74 | HR 76 | Ht 69.0 in | Wt 185.7 lb

## 2016-07-11 DIAGNOSIS — N3941 Urge incontinence: Secondary | ICD-10-CM

## 2016-07-11 LAB — PTNS-PERCUTANEOUS TIBIAL NERVE STIMULATION

## 2016-07-11 NOTE — Progress Notes (Signed)
11:18 AM   [name] [birth year] [id number]  Referring provider: Mar Daring, PA-C [place], [place] [address]  Chief Complaint  Patient presents with  . PTNS    HPI: Patient is an 83 year old Caucasian male who presents today for PTNS treatment for his urinary frequency. This will be a maintenance treatment.   Background history [name] has a long-standing history of urinary frequency and dribbling. He cannot recall a specific event when his urinary symptoms started. He states he has a constant dribbling of a small amount of urine. This occurs all day. He has not experienced an increase in the dribbling with coughing, sneezing, laughing, change in position or bending and lifting.   Looking back at his initial PTNS treatment, he was having day time voids every hour, three nightly voids and always dribbling.  So, he has made significant progress.   Today, he complains of nocturia and incontinence.     After his last treatment, he is making 8 (stable) trips to the bathroom during the daytime. He states he gets up 2 (improvement) times nightly. He is not experiencing burning or pain with urination. He has a mild urgency to urinate. He is having 0 (stable) episodes of incontinence daily.  He feels the treatments are beneficial and would like to continue the treatment.  He does not consume any caffeine beverages, he has 1-2 glasses of wine daily and he drinks eight 8 ounce glasses of water daily.    Previous Therapy: He has been tried on tamsulosin and Toviaz without improvement in his urinary symptoms.    There were no vitals taken for this visit.  He does not have any contraindications present for PTNS, such as: Pacemaker, Implantable defibrillator, History of abnormal bleeding or History of neuropathies or nerve damage.Contraindications present for PTNS   Discussed with patient possible complications of procedure,  such as discomfort, bleeding at insertion/stimulation site, procedure consent signed  Patient goals: The patient's goals were to reduce his urgency and his frequency.   ROS UROLOGY Frequent Urination?: No Hard to postpone urination?: No Burning/pain with urination?: No Get up at night to urinate?: Yes Leakage of urine?: Yes Urine stream starts and stops?: No Trouble starting stream?: No Do you have to strain to urinate?: No Blood in urine?: No Urinary tract infection?: No Sexually transmitted disease?: No Injury to kidneys or bladder?: No Painful intercourse?: No Weak stream?: No Erection problems?: No Penile pain?: No Gastrointestinal Nausea?: No Vomiting?: No Indigestion/heartburn?: No Diarrhea?: No Constipation?: Yes Constitutional Fever: No Night sweats?: No Weight loss?: No Fatigue?: No Skin Skin rash/lesions?: No Itching?: No Eyes Blurred vision?: No Double vision?: No Ears/Nose/Throat Sore throat?: No Sinus problems?: Yes Hematologic/Lymphatic Swollen glands?: No Easy bruising?: No Cardiovascular Leg swelling?: No Chest pain?: No Respiratory Cough?: No Shortness of breath?: No Endocrine Excessive thirst?: No Musculoskeletal Back pain?: No Joint pain?: No Neurological Headaches?: No Dizziness?: No Psychologic Depression?: No Anxiety?: No   Physical Exam Blood pressure (!) 159/74, pulse 76, height 5\' 9"  (1.753 m), weight 185 lb 11.2 oz (84.2 kg). Constitutional: Well nourished. Alert and oriented, No acute distress. HEENT: [place] AT, moist mucus membranes. Trachea midline, no masses. Cardiovascular: No clubbing, cyanosis, or edema. Respiratory: Normal respiratory effort, no increased work of breathing. Skin: No rashes, bruises or suspicious lesions. Lymph: No cervical or inguinal adenopathy. Neurologic: Grossly intact, no focal deficits, moving all 4 extremities. Psychiatric: Normal mood and affect.   1. Urinary incontinence  PTNS  treatment:   The needle electrode was inserted into the lower, inner aspect of the patient's left leg. The surface electrode was  placed on the inside arch of the foot on the treatment leg. The lead set was connected to the stimulator, and the needle  electrode clip was connected to the needle electrode. The stimulator that produces an adjustable electrical pulse that travels  to sacral nerve plexus via the tibial nerve was increased to 19 and a sensory response and toe flex was observed.   Treatment Plan:  Patient tolerated the PTNS treatment well for 30 minutes. The electrode was removed with out difficulty. He will return in one monthfor his maintenance PTNS treatment.

## 2016-07-23 ENCOUNTER — Ambulatory Visit (INDEPENDENT_AMBULATORY_CARE_PROVIDER_SITE_OTHER): Payer: Medicare Other | Admitting: Family Medicine

## 2016-07-23 ENCOUNTER — Encounter: Payer: Self-pay | Admitting: Family Medicine

## 2016-07-23 VITALS — BP 136/78 | HR 68 | Temp 97.7°F | Resp 16 | Wt 185.2 lb

## 2016-07-23 DIAGNOSIS — G609 Hereditary and idiopathic neuropathy, unspecified: Secondary | ICD-10-CM

## 2016-07-23 DIAGNOSIS — B351 Tinea unguium: Secondary | ICD-10-CM

## 2016-07-23 NOTE — Progress Notes (Signed)
Patient: [name] Male    DOB: 08/28/1924   83 y.o.   MRN: [id number] Visit Date: 07/23/2016  Today's Provider: Vernie Murders, PA   Chief Complaint  Patient presents with  . Toe Pain   Subjective:    Toe Pain   Incident onset: 3 weeks ago. There was no injury mechanism. Pain location: right great toe. The quality of the pain is described as aching (when applying pressure). The pain has been intermittent since onset. He has tried nothing for the symptoms.  Patient reports right great toe has a "split" on the top. He has a history of neuropathy.   Past Medical History:  Diagnosis Date  . Arthritis   . Bladder outlet obstruction   . Erectile dysfunction   . GERD (gastroesophageal reflux disease)   . Hard of hearing   . HBP (high blood pressure)   . History of nephrolithiasis   . HLD (hyperlipidemia)   . Incomplete bladder emptying   . Nocturia   . Osteoporosis   . Prostate cancer ([place])   . Shingles 2014  . Sinus problem   . Stomach ulcer   . Stroke ([place]) 2012  . Urinary frequency   . Urinary leakage    Past Surgical History:  Procedure Laterality Date  . CATARACT EXTRACTION  2000, 2001  . CHOLECYSTECTOMY  1972  . PROSTATE SURGERY  2008/2009  . TONSILLECTOMY    . UMBILICAL HERNIA REPAIR     Family History  Problem Relation Age of Onset  . Cancer Mother   . Stroke Father   . Stroke Sister   . Prostate cancer Brother   . Kidney disease Neg Hx    Allergies  Allergen Reactions  . Toviaz [Fesoterodine Fumarate Er] Other (See Comments)    Acid reflux     Previous Medications   ACETAMINOPHEN (TYLENOL) 325 MG TABLET    Take 650 mg by mouth every 6 (six) hours as needed.   AMLODIPINE (NORVASC) 2.5 MG TABLET    TAKE ONE TABLET BY MOUTH ONCE DAILY   ASCORBIC ACID (VITAMIN C PO)    Take by mouth.   ASPIRIN 81 MG TABLET    Take 81 mg by mouth daily.   ESCITALOPRAM (LEXAPRO) 5 MG TABLET    Take 1 tablet (5 mg total) by mouth at bedtime.   GLUCOSAMINE-CHONDROITIN 500-400 MG TABLET    Take 1 tablet by mouth 3 (three) times daily.   HYDROCHLOROTHIAZIDE (MICROZIDE) 12.5 MG CAPSULE    TAKE ONE CAPSULE BY MOUTH ONCE DAILY   LOSARTAN (COZAAR) 25 MG TABLET    TAKE ONE TABLET BY MOUTH ONCE DAILY   MULTIPLE VITAMIN (MULTIVITAMIN) CAPSULE    Take 1 capsule by mouth daily.   OVER THE COUNTER MEDICATION    Take by mouth 1 day or 1 dose. ESTER C   TAMSULOSIN (FLOMAX) 0.4 MG CAPS CAPSULE    TAKE ONE CAPSULE BY MOUTH ONCE DAILY   TAMSULOSIN (FLOMAX) 0.4 MG CAPS CAPSULE    TAKE ONE CAPSULE BY MOUTH ONCE DAILY   VITAMIN B-12 (CYANOCOBALAMIN) 1000 MCG TABLET    Take by mouth.    Review of Systems  Constitutional: Negative.   Respiratory: Negative.   Cardiovascular: Negative.   Musculoskeletal:       Toe pain     Social History  Substance Use Topics  . Smoking status: Former Smoker    Years: 10.00    Quit date: 07/08/1962  . Smokeless tobacco: Never Used  Comment: quit 1964  . Alcohol use 3.0 oz/week    5 Glasses of wine per week     Comment: beer occasionally   Objective:   BP 136/78 (BP Location: Right Arm, Patient Position: Sitting, Cuff Size: Normal)   Pulse 68   Temp 97.7 F (36.5 C) (Oral)   Resp 16   Wt 185 lb 3.2 oz (84 kg)   SpO2 97%   BMI 27.35 kg/m   Physical Exam  Constitutional: He is oriented to person, place, and time. He appears well-developed and well-nourished. No distress.  HENT:  Head: Normocephalic and atraumatic.  Right Ear: Hearing normal.  Left Ear: Hearing normal.  Nose: Nose normal.  Eyes: Conjunctivae and lids are normal. Right eye exhibits no discharge. Left eye exhibits no discharge. No scleral icterus.  Pulmonary/Chest: Effort normal. No respiratory distress.  Musculoskeletal:  Very large and brittle right great toe with large fissure. Good pulses. Loss of sensation in distal toes bilaterally. Large callus on the tip of the right 3rd toe. Hammer deformity of the right second toe and slight  lateral deviation of the 4th toe.  Neurological: He is alert and oriented to person, place, and time.  Skin: Skin is intact. No lesion and no rash noted.  Psychiatric: He has a normal mood and affect. His speech is normal and behavior is normal. Thought content normal.      Assessment & Plan:     1. Idiopathic peripheral neuropathy Having no pain in toes with a large fissure/cut to the tip of the right great toe. May use Neosporin ointment and Band-Aid dressing. Schedule podiatry referral. - Ambulatory referral to Podiatry  2. Fungal infection of nail Has had treatment in the past for onychomycosis but the great toes have had recurrences since using Lamisil 2 years ago. Requests referral to Dr. Ruby Cola (podiatrist). - Ambulatory referral to Podiatry

## 2016-08-02 ENCOUNTER — Ambulatory Visit (INDEPENDENT_AMBULATORY_CARE_PROVIDER_SITE_OTHER): Payer: Medicare Other | Admitting: Podiatry

## 2016-08-02 DIAGNOSIS — M79671 Pain in right foot: Secondary | ICD-10-CM

## 2016-08-02 DIAGNOSIS — L84 Corns and callosities: Secondary | ICD-10-CM

## 2016-08-02 DIAGNOSIS — M79672 Pain in left foot: Secondary | ICD-10-CM

## 2016-08-02 DIAGNOSIS — L851 Acquired keratosis [keratoderma] palmaris et plantaris: Secondary | ICD-10-CM

## 2016-08-03 ENCOUNTER — Other Ambulatory Visit: Payer: Self-pay | Admitting: Physician Assistant

## 2016-08-03 DIAGNOSIS — I10 Essential (primary) hypertension: Secondary | ICD-10-CM

## 2016-08-06 NOTE — Telephone Encounter (Signed)
Please review-aa 

## 2016-08-11 NOTE — Progress Notes (Signed)
11:29 AM   [name] 1924-07-24 [id number]  Referring provider: Mar Daring, PA-C [name], [name] [address]  Chief Complaint  Patient presents with  . PTNS    Urinary Freq    HPI: Patient is an 83 year old Caucasian male who presents today for PTNS treatment for his urinary frequency. This will be a maintenance treatment.   Background history Mr. Magnifico has a long-standing history of urinary frequency and dribbling. He cannot recall a specific event when his urinary symptoms started. He states he has a constant dribbling of a small amount of urine. This occurs all day. He has not experienced an increase in the dribbling with coughing, sneezing, laughing, change in position or bending and lifting.   Looking back at his initial PTNS treatment, he was having day time voids every hour, three nightly voids and always dribbling.  So, he has made significant progress.   Today, he complains of nocturia x 1.      After his last treatment, he is making 7 (improved) trips to the bathroom during the daytime. He states he gets up 1 (improvement) times nightly. He is not experiencing burning or pain with urination. He has a mild urgency to urinate. He is having 0 (stable) episodes of incontinence daily.  He feels the treatments are beneficial and would like to continue the treatment.  He does not consume any caffeine beverages, he has 1-2 glasses of wine daily and he drinks eight 8 ounce glasses of water daily.    Previous Therapy: He has been tried on tamsulosin and Toviaz without improvement in his urinary symptoms.    He does not have any contraindications present for PTNS, such as: Pacemaker, Implantable defibrillator, History of abnormal bleeding or History of neuropathies or nerve damage.Contraindications present for PTNS   Discussed with patient possible complications of procedure, such as discomfort, bleeding at  insertion/stimulation site, procedure consent signed  Patient goals: The patient's goals were to reduce his urgency and his frequency.   ROS UROLOGY Frequent Urination?: No Hard to postpone urination?: No Burning/pain with urination?: No Get up at night to urinate?: Yes Leakage of urine?: No Urine stream starts and stops?: No Trouble starting stream?: No Do you have to strain to urinate?: No Blood in urine?: No Urinary tract infection?: No Sexually transmitted disease?: No Injury to kidneys or bladder?: No Painful intercourse?: No Weak stream?: No Erection problems?: No Penile pain?: No Gastrointestinal Nausea?: No Vomiting?: No Indigestion/heartburn?: No Diarrhea?: No Constipation?: No Constitutional Fever: No Night sweats?: No Weight loss?: No Fatigue?: No Skin Skin rash/lesions?: No Itching?: No Eyes Blurred vision?: No Double vision?: No Ears/Nose/Throat Sore throat?: No Sinus problems?: No Hematologic/Lymphatic Swollen glands?: No Easy bruising?: No Cardiovascular Leg swelling?: No Chest pain?: No Respiratory Cough?: No Shortness of breath?: No Endocrine Excessive thirst?: No Musculoskeletal Back pain?: No Joint pain?: No Neurological Headaches?: No Dizziness?: No Psychologic Depression?: No Anxiety?: No   Physical Exam Blood pressure 134/68, pulse 69, height 5\' 9"  (1.753 m), weight 183 lb 1.6 oz (83.1 kg). Constitutional: Well nourished. Alert and oriented, No acute distress. HEENT: [place] AT, moist mucus membranes. Trachea midline, no masses. Cardiovascular: No clubbing, cyanosis, or edema. Respiratory: Normal respiratory effort, no increased work of breathing. Skin: No rashes, bruises or suspicious lesions. Lymph: No cervical or inguinal adenopathy. Neurologic: Grossly intact, no focal deficits, moving all 4 extremities. Psychiatric: Normal mood and affect.   1. Urinary incontinence  PTNS treatment:   The  needle electrode was  inserted into the lower, inner aspect of the patient's left leg. The surface electrode was  placed on the inside arch of the foot on the treatment leg. The lead set was connected to the stimulator, and the needle  electrode clip was connected to the needle electrode. The stimulator that produces an adjustable electrical pulse that travels  to sacral nerve plexus via the tibial nerve was increased to 8 and a sensory response and toe flex was observed.   Treatment Plan:  Patient tolerated the PTNS treatment well for 30 minutes. The electrode was removed with out difficulty. He will return in one monthfor his maintenance PTNS treatment.

## 2016-08-11 NOTE — Progress Notes (Signed)
   Subjective: Patient presents to the office today for chief complaint of painful callus lesions of the feet. Patient states that the pain is ongoing and is affecting their ability to ambulate without pain. Patient presents today for further treatment and evaluation.  Objective:  Physical Exam General: Alert and oriented x3 in no acute distress  Dermatology: Hyperkeratotic lesion present on the weightbearing surfaces of the bilateral feet. Pain on palpation with a central nucleated core noted.  Skin is warm, dry and supple bilateral lower extremities. Negative for open lesions or macerations.  Vascular: Palpable pedal pulses bilaterally. No edema or erythema noted. Capillary refill within normal limits.  Neurological: Epicritic and protective threshold grossly intact bilaterally.   Musculoskeletal Exam: Pain on palpation at the keratotic lesion noted. Range of motion within normal limits bilateral. Muscle strength 5/5 in all groups bilateral.  Assessment: #1 painful callus lesions bilateral weightbearing surfaces of the feet 4   Plan of Care:  #1 Patient evaluated #2 Excisional debridement of  keratoic lesion using a chisel blade was performed without incident.  #3 Treated area(s) with Salinocaine and dressed with light dressing. #4 Patient is to return to the clinic PRN.   Edrick Kins, DPM [facility]  Dr. Edrick Kins, [address]                                        [place], [place] [address]                Office [phone]  Fax [phone]

## 2016-08-12 ENCOUNTER — Ambulatory Visit (INDEPENDENT_AMBULATORY_CARE_PROVIDER_SITE_OTHER): Payer: Medicare Other | Admitting: Urology

## 2016-08-12 ENCOUNTER — Encounter: Payer: Self-pay | Admitting: Urology

## 2016-08-12 VITALS — BP 134/68 | HR 69 | Ht 69.0 in | Wt 183.1 lb

## 2016-08-12 DIAGNOSIS — R350 Frequency of micturition: Secondary | ICD-10-CM

## 2016-08-12 LAB — PTNS-PERCUTANEOUS TIBIAL NERVE STIMULATION: Scan Result: 8

## 2016-08-19 ENCOUNTER — Emergency Department
Admission: EM | Admit: 2016-08-19 | Discharge: 2016-08-19 | Disposition: A | Discharge status: Home / Self Care | Payer: Medicare Other | Attending: Emergency Medicine | Admitting: Emergency Medicine

## 2016-08-19 ENCOUNTER — Emergency Department: Payer: Medicare Other

## 2016-08-19 DIAGNOSIS — Z8546 Personal history of malignant neoplasm of prostate: Secondary | ICD-10-CM | POA: Diagnosis not present

## 2016-08-19 DIAGNOSIS — S2241XA Multiple fractures of ribs, right side, initial encounter for closed fracture: Secondary | ICD-10-CM | POA: Diagnosis not present

## 2016-08-19 DIAGNOSIS — Y929 Unspecified place or not applicable: Secondary | ICD-10-CM | POA: Diagnosis not present

## 2016-08-19 DIAGNOSIS — S32019A Unspecified fracture of first lumbar vertebra, initial encounter for closed fracture: Secondary | ICD-10-CM | POA: Diagnosis not present

## 2016-08-19 DIAGNOSIS — Y9389 Activity, other specified: Secondary | ICD-10-CM | POA: Diagnosis not present

## 2016-08-19 DIAGNOSIS — Z79899 Other long term (current) drug therapy: Secondary | ICD-10-CM | POA: Diagnosis not present

## 2016-08-19 DIAGNOSIS — S3992XA Unspecified injury of lower back, initial encounter: Secondary | ICD-10-CM | POA: Diagnosis present

## 2016-08-19 DIAGNOSIS — W01198A Fall on same level from slipping, tripping and stumbling with subsequent striking against other object, initial encounter: Secondary | ICD-10-CM | POA: Insufficient documentation

## 2016-08-19 DIAGNOSIS — Y999 Unspecified external cause status: Secondary | ICD-10-CM | POA: Diagnosis not present

## 2016-08-19 DIAGNOSIS — Z23 Encounter for immunization: Secondary | ICD-10-CM | POA: Insufficient documentation

## 2016-08-19 DIAGNOSIS — I10 Essential (primary) hypertension: Secondary | ICD-10-CM | POA: Insufficient documentation

## 2016-08-19 DIAGNOSIS — Z87891 Personal history of nicotine dependence: Secondary | ICD-10-CM | POA: Insufficient documentation

## 2016-08-19 DIAGNOSIS — S32009A Unspecified fracture of unspecified lumbar vertebra, initial encounter for closed fracture: Secondary | ICD-10-CM | POA: Diagnosis not present

## 2016-08-19 LAB — URINALYSIS, COMPLETE (UACMP) WITH MICROSCOPIC
BACTERIA UA: NONE SEEN
BILIRUBIN URINE: NEGATIVE
GLUCOSE, UA: NEGATIVE mg/dL
HGB URINE DIPSTICK: NEGATIVE
KETONES UR: NEGATIVE mg/dL
LEUKOCYTES UA: NEGATIVE
Nitrite: NEGATIVE
PROTEIN: NEGATIVE mg/dL
Specific Gravity, Urine: 1.021 (ref 1.005–1.030)
pH: 5 (ref 5.0–8.0)

## 2016-08-19 MED ORDER — TETANUS-DIPHTH-ACELL PERTUSSIS 5-2.5-18.5 LF-MCG/0.5 IM SUSP
0.5000 mL | Freq: Once | INTRAMUSCULAR | Status: AC
  Administered 2016-08-19: 0.5 mL via INTRAMUSCULAR
  Filled 2016-08-19: qty 0.5

## 2016-08-19 MED ORDER — OXYCODONE-ACETAMINOPHEN 5-325 MG PO TABS: 1.0000 | ORAL_TABLET | Freq: Four times a day (QID) | ORAL | 0 refills | Status: DC | PRN

## 2016-08-19 MED ORDER — OXYCODONE-ACETAMINOPHEN 5-325 MG PO TABS
1.0000 | ORAL_TABLET | Freq: Once | ORAL | Status: AC
  Administered 2016-08-19: 1 via ORAL
  Filled 2016-08-19: qty 1

## 2016-08-19 MED ORDER — TETANUS-DIPHTHERIA TOXOIDS TD 5-2 LFU IM INJ: 0.5000 mL | INJECTION | Freq: Once | INTRAMUSCULAR | Status: DC

## 2016-08-19 MED ORDER — DOCUSATE SODIUM 100 MG PO CAPS: 100.0000 mg | ORAL_CAPSULE | Freq: Two times a day (BID) | ORAL | 0 refills | Status: AC | PRN

## 2016-08-19 NOTE — ED Provider Notes (Signed)
[facility] Emergency Department Provider Note  ____________________________________________   First MD Initiated Contact with Patient 08/19/16 0425     (approximate)  I have reviewed the triage vital signs and the nursing notes.   HISTORY  Chief Complaint Fall and Back Pain   HPI [name] is a 83 y.o. male  who is presenting to the emergency department today after slip and fall home. He says that he was coming down wooden stairs in socks and slipped on last year. He says that he hit his mid right side of his back up against the last stair and is now having pain there as well as sustaining an abrasion over the right scapula. Patient does not know the date of his last tetanus shot. Says it hurts take a deep breath to the low back. Able to ambulate. No pain in the hips bilaterally. No losing consciousness. Denies hitting his head.   Past Medical History:  Diagnosis Date  . Arthritis   . Bladder outlet obstruction   . Erectile dysfunction   . GERD (gastroesophageal reflux disease)   . Hard of hearing   . HBP (high blood pressure)   . History of nephrolithiasis   . HLD (hyperlipidemia)   . Incomplete bladder emptying   . Nocturia   . Osteoporosis   . Prostate cancer ([place])   . Shingles 2014  . Sinus problem   . Stomach ulcer   . Stroke ([place]) 2012  . Urinary frequency   . Urinary leakage     Patient Active Problem List   Diagnosis Date Noted  . Urinary frequency 09/18/2015  . Allergic rhinitis 06/27/2015  . Cerebral infarction ([place]) 06/27/2015  . DD (diverticular disease) 06/27/2015  . Accumulation of fluid in tissues 06/27/2015  . Benign essential HTN 06/27/2015  . Hypercholesterolemia 06/27/2015  . Burning or prickling sensation 06/27/2015  . Fungal infection of nail 06/27/2015  . Foot pain, right 04/10/2015  . Abnormal glucose 03/15/2015  . Prostate cancer ([place]) 12/18/2014  . Arthritis, degenerative 01/12/2014  . H/O malignant  neoplasm of prostate 12/03/2012  . Urge incontinence 11/09/2012  . Obstruction of urinary tract 11/09/2012  . Dermatophytosis of groin 09/29/2012  . 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 Incomplete bladder emptying 09/29/2012    Past Surgical History:  Procedure Laterality Date  . CATARACT EXTRACTION  2000, 2001  . CHOLECYSTECTOMY  1972  . PROSTATE SURGERY  2008/2009  . TONSILLECTOMY    . UMBILICAL HERNIA REPAIR      Prior to Admission medications   Medication Sig Start Date End Date Taking? Authorizing Provider  acetaminophen (TYLENOL) 325 MG tablet Take 650 mg by mouth every 6 (six) hours as needed.    Historical Provider, MD  amLODipine (NORVASC) 2.5 MG tablet TAKE ONE TABLET BY MOUTH ONCE DAILY 08/06/16   Carmon Ginsberg, PA  Ascorbic Acid (VITAMIN C PO) Take by mouth.    Historical Provider, MD  aspirin 81 MG tablet Take 81 mg by mouth daily.    Historical Provider, MD  escitalopram (LEXAPRO) 5 MG tablet Take 1 tablet (5 mg total) by mouth at bedtime. 05/09/16   Mar Daring, PA-C  glucosamine-chondroitin 500-400 MG tablet Take 1 tablet by mouth 3 (three) times daily.    Historical Provider, MD  hydrochlorothiazide (MICROZIDE) 12.5 MG capsule TAKE ONE CAPSULE BY MOUTH ONCE DAILY 04/22/16   Clearnce Sorrel Burnette, PA-C  losartan (COZAAR) 25 MG tablet TAKE ONE TABLET BY MOUTH ONCE  DAILY 04/22/16   Mar Daring, PA-C  Multiple Vitamin (MULTIVITAMIN) capsule Take 1 capsule by mouth daily.    Historical Provider, MD  OVER THE COUNTER MEDICATION Take by mouth 1 day or 1 dose. ESTER C    Historical Provider, MD  tamsulosin (FLOMAX) 0.4 MG CAPS capsule TAKE ONE CAPSULE BY MOUTH ONCE DAILY Patient not taking: Reported on 08/02/2016 02/14/16   Larene Beach A McGowan, PA-C  tamsulosin (FLOMAX) 0.4 MG CAPS capsule TAKE ONE CAPSULE BY MOUTH ONCE DAILY 07/05/16   [name], PA-C  vitamin B-12 (CYANOCOBALAMIN) 1000 MCG tablet Take by mouth.    Historical Provider, MD     Allergies Toviaz [fesoterodine fumarate er]  Family History  Problem Relation Age of Onset  . Cancer Mother   . Stroke Father   . Stroke Sister   . Prostate cancer Brother   . Kidney disease Neg Hx     Social History Social History  Substance Use Topics  . Smoking status: Former Smoker    Years: 10.00    Quit date: 07/08/1962  . Smokeless tobacco: Never Used     Comment: quit 1964  . Alcohol use 3.0 oz/week    5 Glasses of wine per week     Comment: beer occasionally    Review of Systems Constitutional: No fever/chills Eyes: No visual changes. ENT: No sore throat. Cardiovascular: Denies chest pain. Respiratory: Denies shortness of breath. Gastrointestinal: No abdominal pain.  No nausea, no vomiting.  No diarrhea.  No constipation. Genitourinary: Negative for dysuria. Musculoskeletal: as above Skin: Negative for rash. Neurological: Negative for headaches, focal weakness or numbness.  10-point ROS otherwise negative.  ____________________________________________   PHYSICAL EXAM:  VITAL SIGNS: ED Triage Vitals  Enc Vitals Group     BP 08/19/16 0245 (!) 147/63     Pulse Rate 08/19/16 0245 66     Resp 08/19/16 0245 18     Temp 08/19/16 0245 98.4 F (36.9 C)     Temp Source 08/19/16 0245 Oral     SpO2 08/19/16 0245 96 %     Weight 08/19/16 0243 183 lb (83 kg)     Height 08/19/16 0243 5\' 9"  (1.753 m)     Head Circumference --      Peak Flow --      Pain Score 08/19/16 0245 5     Pain Loc --      Pain Edu? --      Excl. in [place]? --     Constitutional: Alert and oriented. Well appearing and in no acute distress. Eyes: Conjunctivae are normal. PERRL. EOMI. Head: Atraumatic. Nose: No congestion/rhinnorhea. Mouth/Throat: Mucous membranes are moist.   Neck: No stridor.   Cardiovascular: Normal rate, regular rhythm. Grossly normal heart sounds.   Respiratory: Normal respiratory effort.  No retractions. Lungs CTAB. Gastrointestinal: Soft And nontender. No  distention.  Musculoskeletal: No lower extremity tenderness nor edema.  No joint effusions.  Small abrasion over the right scapula that is superficial. Measuring about 1 cm x 6 cm. Tenderness palpation over ribs 9 through 11 on the right side posteriorly. No ecchymosis, crepitus or deformity visualized. No tenderness palpation to the bilateral hips. 5 out of 5 strength in bilateral lower extremities. Neurologic:  Normal speech and language. No gross focal neurologic deficits are appreciated.  Skin:  Skin is warm, dry and intact. No rash noted. Psychiatric: Mood and affect are normal. Speech and behavior are normal.  ____________________________________________   LABS (all labs ordered are listed,  but only abnormal results are displayed)  Labs Reviewed  URINALYSIS, COMPLETE (UACMP) WITH MICROSCOPIC   ____________________________________________  EKG   ____________________________________________  [id number]  CT Chest Wo Contrast (Final result)  Result time 08/19/16 06:09:17  Final result by Lucienne Capers, MD (08/19/16 06:09:17)           Narrative:   CLINICAL DATA: Slip and fall injury 1 hour ago. Right lower posterior rib pain. Abrasions to the right shoulder.  EXAM: CT CHEST WITHOUT CONTRAST  TECHNIQUE: Multidetector CT imaging of the chest was performed following the standard protocol without IV contrast.  COMPARISON: None.  FINDINGS: Cardiovascular: Normal heart size. No pericardial effusion. Coronary artery and aortic valve calcifications. Normal caliber thoracic aorta with calcification.  Mediastinum/Nodes: Scattered lymph nodes in the mediastinum are not pathologically enlarged. Esophagus is decompressed. Small esophageal hiatal hernia. Thyroid gland is unremarkable.  Lungs/Pleura: Lungs are clear and expanded. No focal consolidation or airspace disease. No pleural effusions. No pneumothorax. Airways are patent.  Upper Abdomen: Multiple cysts in the  liver, measuring up to about 4.7 cm diameter. Surgical absence of the gallbladder. No bile duct dilatation. Calcification of upper abdominal arteries. Calcified granulomas in the spleen.  Musculoskeletal: Diffusion versus bursal collection in the left shoulder. Degenerative changes in the spine. Sternum and ribs appear intact. Sternum appears intact. Acute fractures are demonstrated at the posterior right twelfth rib near the costovertebral junction and in the posterolateral right eleventh rib. Fracture of the right L1 transverse process.  IMPRESSION: No mediastinal hematoma. Lungs are clear and expanded. Effusion or bursal collection in the left shoulder. Posterior right eleventh and twelfth rib fractures. Fracture right L1 transverse process. Multiple liver cysts. Aortic atherosclerosis.   Electronically Signed By: Lucienne Capers M.D. On: 08/19/2016 06:09            ____________________________________________   PROCEDURES  Procedure(s) performed:   Procedures  Critical Care performed:   ____________________________________________   INITIAL IMPRESSION / ASSESSMENT AND PLAN / ED COURSE  Pertinent labs & imaging results that were available during my care of the patient were reviewed by me and considered in my medical decision making (see chart for details).  ----------------------------------------- 6:33 AM on 08/19/2016 -----------------------------------------  Updated patient about his imaging results. He'll be given referral to neurosurgery as well as pain meds and a stool softener. We discussed rest as well as the use of an incentive spirometer with 10 deep breaths 6 times an hour. The patient is understanding of the plan and willing to comply. Will be discharged home. Normal UA. Very unlikely to have underlying renal injury.      ____________________________________________   FINAL CLINICAL IMPRESSION(S) / ED DIAGNOSES  Rib fractures.  Transverse process fracture.    NEW MEDICATIONS STARTED DURING THIS VISIT:  New Prescriptions   No medications on file     Note:  This document was prepared using Dragon voice recognition software and may include unintentional dictation errors.    Orbie Pyo, MD 08/19/16 [phone]

## 2016-08-19 NOTE — ED Triage Notes (Addendum)
Pt says about 1 hour ago he slipped on the floor, fell against the wall and hit his right lower back on the bottom step of his stairs; tenderness to area; no bruising noted at this time; abrasion to right shoulder; pt denies pain to same; pt says pain does not increase when he ambulates, but does increase if he coughs

## 2016-09-13 ENCOUNTER — Ambulatory Visit (INDEPENDENT_AMBULATORY_CARE_PROVIDER_SITE_OTHER): Payer: Medicare Other | Admitting: Physician Assistant

## 2016-09-13 ENCOUNTER — Ambulatory Visit (INDEPENDENT_AMBULATORY_CARE_PROVIDER_SITE_OTHER): Payer: Medicare Other

## 2016-09-13 VITALS — BP 122/62 | HR 64 | Temp 97.4°F | Ht 69.0 in | Wt 183.2 lb

## 2016-09-13 DIAGNOSIS — R7309 Other abnormal glucose: Secondary | ICD-10-CM

## 2016-09-13 DIAGNOSIS — I10 Essential (primary) hypertension: Secondary | ICD-10-CM

## 2016-09-13 DIAGNOSIS — E7800 Pure hypercholesterolemia, unspecified: Secondary | ICD-10-CM | POA: Diagnosis not present

## 2016-09-13 DIAGNOSIS — Z0000 Encounter for general adult medical examination without abnormal findings: Secondary | ICD-10-CM

## 2016-09-13 DIAGNOSIS — F3342 Major depressive disorder, recurrent, in full remission: Secondary | ICD-10-CM

## 2016-09-13 MED ORDER — ESCITALOPRAM OXALATE 5 MG PO TABS: 5.0000 mg | ORAL_TABLET | Freq: Every day | ORAL | 1 refills | Status: DC

## 2016-09-13 NOTE — Patient Instructions (Signed)
 Health Maintenance, Male A healthy lifestyle and preventive care is important for your health and wellness. Ask your health care provider about what schedule of regular examinations is right for you. What should I know about weight and diet?  Eat a Healthy Diet  Eat plenty of vegetables, fruits, whole grains, low-fat dairy products, and lean protein.  Do not eat a lot of foods high in solid fats, added sugars, or salt. Maintain a Healthy Weight  Regular exercise can help you achieve or maintain a healthy weight. You should:  Do at least 150 minutes of exercise each week. The exercise should increase your heart rate and make you sweat (moderate-intensity exercise).  Do strength-training exercises at least twice a week. Watch Your Levels of Cholesterol and Blood Lipids  Have your blood tested for lipids and cholesterol every 5 years starting at 81 years of age. If you are at high risk for heart disease, you should start having your blood tested when you are 81 years old. You may need to have your cholesterol levels checked more often if:  Your lipid or cholesterol levels are high.  You are older than 81 years of age.  You are at high risk for heart disease. What should I know about cancer screening? Many types of cancers can be detected early and may often be prevented. Lung Cancer  You should be screened every year for lung cancer if:  You are a current smoker who has smoked for at least 30 years.  You are a former smoker who has quit within the past 15 years.  Talk to your health care provider about your screening options, when you should start screening, and how often you should be screened. Colorectal Cancer  Routine colorectal cancer screening usually begins at 81 years of age and should be repeated every 5-10 years until you are 81 years old. You may need to be screened more often if early forms of precancerous polyps or small growths are found. Your health care provider  may recommend screening at an earlier age if you have risk factors for colon cancer.  Your health care provider may recommend using home test kits to check for hidden blood in the stool.  A small camera at the end of a tube can be used to examine your colon (sigmoidoscopy or colonoscopy). This checks for the earliest forms of colorectal cancer. Prostate and Testicular Cancer  Depending on your age and overall health, your health care provider may do certain tests to screen for prostate and testicular cancer.  Talk to your health care provider about any symptoms or concerns you have about testicular or prostate cancer. Skin Cancer  Check your skin from head to toe regularly.  Tell your health care provider about any new moles or changes in moles, especially if:  There is a change in a mole's size, shape, or color.  You have a mole that is larger than a pencil eraser.  Always use sunscreen. Apply sunscreen liberally and repeat throughout the day.  Protect yourself by wearing long sleeves, pants, a wide-brimmed hat, and sunglasses when outside. What should I know about heart disease, diabetes, and high blood pressure?  If you are 18-39 years of age, have your blood pressure checked every 3-5 years. If you are 40 years of age or older, have your blood pressure checked every year. You should have your blood pressure measured twice-once when you are at a hospital or clinic, and once when you are not at   a hospital or clinic. Record the average of the two measurements. To check your blood pressure when you are not at a hospital or clinic, you can use:  An automated blood pressure machine at a pharmacy.  A home blood pressure monitor.  Talk to your health care provider about your target blood pressure.  If you are between 45-79 years old, ask your health care provider if you should take aspirin to prevent heart disease.  Have regular diabetes screenings by checking your fasting blood sugar  level.  If you are at a normal weight and have a low risk for diabetes, have this test once every three years after the age of 45.  If you are overweight and have a high risk for diabetes, consider being tested at a younger age or more often.  A one-time screening for abdominal aortic aneurysm (AAA) by ultrasound is recommended for men aged 65-75 years who are current or former smokers. What should I know about preventing infection? Hepatitis B  If you have a higher risk for hepatitis B, you should be screened for this virus. Talk with your health care provider to find out if you are at risk for hepatitis B infection. Hepatitis C  Blood testing is recommended for:  Everyone born from 1945 through 1965.  Anyone with known risk factors for hepatitis C. Sexually Transmitted Diseases (STDs)  You should be screened each year for STDs including gonorrhea and chlamydia if:  You are sexually active and are younger than 81 years of age.  You are older than 81 years of age and your health care provider tells you that you are at risk for this type of infection.  Your sexual activity has changed since you were last screened and you are at an increased risk for chlamydia or gonorrhea. Ask your health care provider if you are at risk.  Talk with your health care provider about whether you are at high risk of being infected with HIV. Your health care provider may recommend a prescription medicine to help prevent HIV infection. What else can I do?  Schedule regular health, dental, and eye exams.  Stay current with your vaccines (immunizations).  Do not use any tobacco products, such as cigarettes, chewing tobacco, and e-cigarettes. If you need help quitting, ask your health care provider.  Limit alcohol intake to no more than 2 drinks per day. One drink equals 12 ounces of beer, 5 ounces of wine, or 1 ounces of hard liquor.  Do not use street drugs.  Do not share needles.  Ask your health  care provider for help if you need support or information about quitting drugs.  Tell your health care provider if you often feel depressed.  Tell your health care provider if you have ever been abused or do not feel safe at home. This information is not intended to replace advice given to you by your health care provider. Make sure you discuss any questions you have with your health care provider. Document Released: 12/21/2007 Document Revised: 02/21/2016 Document Reviewed: 03/28/2015 Elsevier Interactive Patient Education  2017 [employer]vier Inc.  

## 2016-09-13 NOTE — Progress Notes (Signed)
Subjective:   [name] is a 83 y.o. male who presents for Medicare Annual/Subsequent preventive examination.  Review of Systems:  N/A  Cardiac Risk Factors include: advanced age (>61men, >13 women);dyslipidemia;hypertension;male gender     Objective:    Vitals: BP 122/62 (BP Location: Right Arm)   Pulse 64   Temp 97.4 F (36.3 C) (Oral)   Ht 5\' 9"  (1.753 m)   Wt 183 lb 3.2 oz (83.1 kg)   BMI 27.05 kg/m   Body mass index is 27.05 kg/m.  Tobacco History  Smoking Status  . Former Smoker  . Years: 10.00  . Quit date: 07/08/1962  Smokeless Tobacco  . Never Used    Comment: quit 1964     Counseling given: Not Answered   Past Medical History:  Diagnosis Date  . Arthritis   . Bladder outlet obstruction   . Erectile dysfunction   . GERD (gastroesophageal reflux disease)   . Hard of hearing   . HBP (high blood pressure)   . History of nephrolithiasis   . HLD (hyperlipidemia)   . Incomplete bladder emptying   . Nocturia   . Osteoporosis   . Prostate cancer ([place])   . Shingles 2014  . Sinus problem   . Stomach ulcer   . Stroke ([place]) 2012  . Urinary frequency   . Urinary leakage    Past Surgical History:  Procedure Laterality Date  . CATARACT EXTRACTION  2000, 2001  . CHOLECYSTECTOMY  1972  . PROSTATE SURGERY  2008/2009  . TONSILLECTOMY    . UMBILICAL HERNIA REPAIR     Family History  Problem Relation Age of Onset  . Cancer Mother   . Stroke Father   . Stroke Sister   . Prostate cancer Brother   . Kidney disease Neg Hx    History  Sexual Activity  . Sexual activity: Not on file    Outpatient Encounter Prescriptions as of 09/13/2016  Medication Sig  . acetaminophen (TYLENOL) 325 MG tablet Take 650 mg by mouth every 6 (six) hours as needed.  . Aloe Vera 25 MG CAPS Take by mouth.  [name] amLODipine (NORVASC) 2.5 MG tablet TAKE ONE TABLET BY MOUTH ONCE DAILY  . Ascorbic Acid (VITAMIN C PO) Take by mouth.  [name] aspirin 81 MG tablet Take 81 mg by mouth  daily.  [name] docusate sodium (COLACE) 100 MG capsule Take 1 capsule (100 mg total) by mouth 2 (two) times daily as needed for mild constipation or moderate constipation.  [name] escitalopram (LEXAPRO) 5 MG tablet Take 1 tablet (5 mg total) by mouth at bedtime.  [name] glucosamine-chondroitin 500-400 MG tablet Take 1 tablet by mouth 2 (two) times daily.   . hydrochlorothiazide (MICROZIDE) 12.5 MG capsule TAKE ONE CAPSULE BY MOUTH ONCE DAILY  . losartan (COZAAR) 25 MG tablet TAKE ONE TABLET BY MOUTH ONCE DAILY  . Multiple Vitamin (MULTIVITAMIN) capsule Take 1 capsule by mouth daily.  [name] OVER THE COUNTER MEDICATION Take by mouth 1 day or 1 dose. ESTER C  . tamsulosin (FLOMAX) 0.4 MG CAPS capsule TAKE ONE CAPSULE BY MOUTH ONCE DAILY  . vitamin B-12 (CYANOCOBALAMIN) 1000 MCG tablet Take by mouth.  . oxyCODONE-acetaminophen (ROXICET) 5-325 MG tablet Take 1 tablet by mouth every 6 (six) hours as needed. (Patient not taking: Reported on 09/13/2016)  . tamsulosin (FLOMAX) 0.4 MG CAPS capsule TAKE ONE CAPSULE BY MOUTH ONCE DAILY (Patient not taking: Reported on 09/13/2016)   No facility-administered encounter medications on file as of  09/13/2016.     Activities of Daily Living In your present state of health, do you have any difficulty performing the following activities: 09/13/2016 09/14/2015  Hearing? Tempie Donning  Vision? N N  Difficulty concentrating or making decisions? N N  Walking or climbing stairs? N N  Dressing or bathing? N N  Doing errands, shopping? Y N  Preparing Food and eating ? N -  Using the Toilet? N -  In the past six months, have you accidently leaked urine? N -  Do you have problems with loss of bowel control? N -  Managing your Medications? N -  Managing your Finances? N -  Housekeeping or managing your Housekeeping? N -  Some recent data might be hidden    Patient Care Team: Mar Daring, PA-C as PCP - General (Family Medicine) Edrick Kins, DPM as Consulting Physician (Podiatry) [name], PA-C as Physician Assistant (Urology)   Assessment:    Exercise Activities and Dietary recommendations Current Exercise Habits: Home exercise routine, Type of exercise: stretching, Time (Minutes): 10 (less than), Frequency (Times/Week): 7, Weekly Exercise (Minutes/Week): 70, Intensity: Mild, Exercise limited by: Other - see comments (pulled muscle in left arm)  Goals    . Exercise 150 minutes per week (moderate activity)          Recommend more exercise. Pt to start walking daily in Spring 2018 for at least a mile.    [name] HEMOGLOBIN A1C < 7.0      Fall Risk Fall Risk  09/13/2016 09/14/2015 03/15/2015 12/22/2014  Falls in the past year? Yes No Yes No  Number falls in past yr: 1 - 1 -  Injury with Fall? Yes - No -  Risk for fall due to : - - Impaired balance/gait;Impaired mobility Impaired balance/gait  Follow up Falls prevention discussed - Education provided;Falls prevention discussed;Falls evaluation completed -   Depression Screen PHQ 2/9 Scores 09/13/2016 01/04/2016 09/14/2015 03/15/2015  PHQ - 2 Score 1 3 0 1  PHQ- 9 Score - 5 - -    Cognitive Function     6CIT Screen 09/13/2016  What Year? 0 points  What month? 0 points  What time? 0 points  Count back from 20 0 points  Months in reverse 0 points  Repeat phrase 0 points  Total Score 0    Immunization History  Administered Date(s) Administered  . Influenza Split 05/15/2009, 05/08/2011  . Influenza, High Dose Seasonal PF 04/13/2014  . Influenza,inj,Quad PF,36+ Mos 04/07/2013  . Pneumococcal Conjugate-13 04/13/2014  . Pneumococcal Polysaccharide-23 06/09/2012  . Td 01/19/2007  . Tdap 12/04/2010, 08/19/2016  . Zoster 04/20/2007   Screening Tests Health Maintenance  Topic Date Due  . TETANUS/TDAP  08/19/2026  . INFLUENZA VACCINE  Completed  . PNA vac Low Risk Adult  Completed      Plan:  I have personally reviewed and addressed the Medicare Annual Wellness questionnaire and have noted the following in the patient's  chart:  A. Medical and social history B. Use of alcohol, tobacco or illicit drugs  C. Current medications and supplements D. Functional ability and status E.  Nutritional status F.  Physical activity G. Advance directives H. List of other physicians I.  Hospitalizations, surgeries, and ER visits in previous 12 months J.  [place] such as hearing and vision if needed, cognitive and depression L. Referrals and appointments - none  In addition, I have reviewed and discussed with patient certain preventive protocols, quality metrics, and best  practice recommendations. A written personalized care plan for preventive services as well as general preventive health recommendations were provided to patient.  See attached scanned questionnaire for additional information.   Signed,  Fabio Neighbors, LPN Nurse Health Advisor   MD Recommendations: None.  I have reviewed the documentation and information obtained by Fabio Neighbors, LPN in the above chart and agree as above. I was available for consultation if any questions or issues arose.  Fenton Malling, PA-C

## 2016-09-13 NOTE — Patient Instructions (Signed)

## 2016-09-13 NOTE — Progress Notes (Signed)
Patient: [name] Male    DOB: [birth year]   83 y.o.   MRN: [id number] Visit Date: 09/13/2016  Today's Provider: Mar Daring, PA-C   Chief Complaint  Patient presents with  . Hypertension  . Depression   Subjective:    HPI  Hypertension, follow-up:  BP Readings from Last 3 Encounters:  09/13/16 122/62  08/19/16 (!) 154/74  08/12/16 134/68    He was last seen for hypertension 4 months ago.  BP at that visit was 130/70. Management changes since that visit include no changes. He reports excellent compliance with treatment. He is not having side effects.  He is exercising. He is adherent to low salt diet.   Outside blood pressures are stable. He is experiencing none.  Patient denies chest pain and lower extremity edema.   Cardiovascular risk factors include advanced age (older than 9 for men, 53 for women), diabetes mellitus and hypertension.  Use of agents associated with hypertension: none.     Weight trend: stable Wt Readings from Last 3 Encounters:  09/13/16 183 lb 3.2 oz (83.1 kg)  08/19/16 183 lb (83 kg)  08/12/16 183 lb 1.6 oz (83.1 kg)    Current diet: not asked  ------------------------------------------------------------------------   Depression, Follow-up  He  was last seen for this 4 months ago. Changes made at last visit include no changes.   He reports excellent compliance with treatment. He is not having side effects.   He reports excellent tolerance of treatment. Current symptoms include: none He feels he is Improved since last visit.  ------------------------------------------------------------------------       Allergies  Allergen Reactions  . Toviaz [Fesoterodine Fumarate Er] Other (See Comments)    Acid reflux     Current Outpatient Prescriptions:  .  acetaminophen (TYLENOL) 325 MG tablet, Take 650 mg by mouth every 6 (six) hours as needed., Disp: , Rfl:  .  Aloe Vera 25 MG CAPS, Take by mouth., Disp:  , Rfl:  .  amLODipine (NORVASC) 2.5 MG tablet, TAKE ONE TABLET BY MOUTH ONCE DAILY, Disp: 90 tablet, Rfl: 1 .  Ascorbic Acid (VITAMIN C PO), Take by mouth., Disp: , Rfl:  .  aspirin 81 MG tablet, Take 81 mg by mouth daily., Disp: , Rfl:  .  docusate sodium (COLACE) 100 MG capsule, Take 1 capsule (100 mg total) by mouth 2 (two) times daily as needed for mild constipation or moderate constipation., Disp: 30 capsule, Rfl: 0 .  escitalopram (LEXAPRO) 5 MG tablet, Take 1 tablet (5 mg total) by mouth at bedtime., Disp: 30 tablet, Rfl: 5 .  glucosamine-chondroitin 500-400 MG tablet, Take 1 tablet by mouth 2 (two) times daily. , Disp: , Rfl:  .  hydrochlorothiazide (MICROZIDE) 12.5 MG capsule, TAKE ONE CAPSULE BY MOUTH ONCE DAILY, Disp: 90 capsule, Rfl: 1 .  losartan (COZAAR) 25 MG tablet, TAKE ONE TABLET BY MOUTH ONCE DAILY, Disp: 90 tablet, Rfl: 1 .  Multiple Vitamin (MULTIVITAMIN) capsule, Take 1 capsule by mouth daily., Disp: , Rfl:  .  OVER THE COUNTER MEDICATION, Take by mouth 1 day or 1 dose. ESTER C, Disp: , Rfl:  .  oxyCODONE-acetaminophen (ROXICET) 5-325 MG tablet, Take 1 tablet by mouth every 6 (six) hours as needed. (Patient not taking: Reported on 09/13/2016), Disp: 15 tablet, Rfl: 0 .  tamsulosin (FLOMAX) 0.4 MG CAPS capsule, TAKE ONE CAPSULE BY MOUTH ONCE DAILY, Disp: 30 capsule, Rfl: 0 .  tamsulosin (FLOMAX) 0.4 MG CAPS capsule, TAKE  ONE CAPSULE BY MOUTH ONCE DAILY (Patient not taking: Reported on 09/13/2016), Disp: 90 capsule, Rfl: 3 .  vitamin B-12 (CYANOCOBALAMIN) 1000 MCG tablet, Take by mouth., Disp: , Rfl:   Review of Systems  Constitutional: Negative.   HENT: Positive for rhinorrhea.   Eyes: Negative.   Respiratory: Negative.   Cardiovascular: Negative.   Gastrointestinal: Negative.   Endocrine: Negative.   Genitourinary: Negative.   Musculoskeletal: Positive for myalgias.  Skin: Negative.   Allergic/Immunologic: Negative.   Neurological: Negative.   Hematological: Negative.     Psychiatric/Behavioral: Negative.     Social History  Substance Use Topics  . Smoking status: Former Smoker    Years: 10.00    Quit date: 07/08/1962  . Smokeless tobacco: Never Used     Comment: quit 1964  . Alcohol use No   Objective:    BP  122/62 (BP Location: Right Arm)     Pulse  64     Temp  97.4 F (36.3 C) (Oral)     Ht  5\' 9"  (1.753 m)     Wt  183 lb 3.2 oz (83.1 kg)      BMI  27.05 kg/m        Physical Exam  Constitutional: He appears well-developed and well-nourished. No distress.  HENT:  Head: Normocephalic and atraumatic.  Right Ear: Hearing, tympanic membrane, external ear and ear canal normal.  Left Ear: Hearing, tympanic membrane, external ear and ear canal normal.  Nose: Nose normal.  Mouth/Throat: Uvula is midline, oropharynx is clear and moist and mucous membranes are normal. No oropharyngeal exudate.  Neck: Normal range of motion. Neck supple. No JVD present. Carotid bruit is not present. No tracheal deviation present. No thyromegaly present.  Cardiovascular: Normal rate, regular rhythm and normal heart sounds.  Exam reveals no gallop and no friction rub.   No murmur heard. Pulmonary/Chest: Effort normal and breath sounds normal. No respiratory distress. He has no wheezes. He has no rales.  Musculoskeletal: He exhibits no edema.  Lymphadenopathy:    He has no cervical adenopathy.  Skin: He is not diaphoretic.  Psychiatric: He has a normal mood and affect. His behavior is normal. Judgment and thought content normal.  Vitals reviewed.      Assessment & Plan:     1. Benign essential HTN Stable Continue HCTZ 12.5mg , losartan 25mg , amlodipine 2.5mg . Will check labs as below and f/u pending results. - CBC w/Diff/Platelet - Comprehensive Metabolic Panel (CMET)  2. Hypercholesterolemia Stable. Will check labs as below and f/u pending results. - Comprehensive Metabolic Panel (CMET) - Lipid Profile  3. Abnormal glucose Will check labs  as below and f/u pending results. - Comprehensive Metabolic Panel (CMET) - HgB A1c  4. Recurrent major depressive disorder, in full remission ([place]) Stable. Diagnosis pulled for medication refill. Continue current medical treatment plan. - escitalopram (LEXAPRO) 5 MG tablet; Take 1 tablet (5 mg total) by mouth at bedtime.  Dispense: 90 tablet; Refill: [place], PA-C  [facility]

## 2016-09-15 NOTE — Progress Notes (Signed)
11:04 AM   Trish Fountain [place] 1924/12/26 [id number]  Referring provider: Mar Daring, PA-C [place], [place] [address]  Chief Complaint  Patient presents with  . PTNS    HPI: Patient is an 83 year old Caucasian male who presents today for PTNS treatment for his urinary frequency. This will be a maintenance treatment.   Background history Mr. Pavich has a long-standing history of urinary frequency and dribbling. He cannot recall a specific event when his urinary symptoms started. He states he has a constant dribbling of a small amount of urine. This occurs all day. He has not experienced an increase in the dribbling with coughing, sneezing, laughing, change in position or bending and lifting.   Looking back at his initial PTNS treatment, he was having day time voids every hour, three nightly voids and always dribbling.  So, he has made significant progress.   Today, he complains of nocturia x 1 and urgency.       After his last treatment, he is making 7 (stable) trips to the bathroom during the daytime. He states he gets up 1 (stable) times nightly. He is not experiencing burning or pain with urination. He has a mild urgency to urinate. He is having 0 (stable) episodes of incontinence daily.  He feels the treatments are beneficial and would like to continue the treatment.  He does not consume any caffeine beverages, he has 1-2 glasses of wine daily and he drinks eight 8 ounce glasses of water daily.    Previous Therapy: He has been tried on tamsulosin and Toviaz without improvement in his urinary symptoms.    He does not have any contraindications present for PTNS, such as: Pacemaker, Implantable defibrillator, History of abnormal bleeding or History of neuropathies or nerve damage.Contraindications present for PTNS   Discussed with patient possible complications of procedure, such as discomfort, bleeding at  insertion/stimulation site, procedure consent signed  Patient goals: The patient's goals were to reduce his urgency and his frequency.   ROS UROLOGY Frequent Urination?: No Hard to postpone urination?: Yes Burning/pain with urination?: No Get up at night to urinate?: Yes Leakage of urine?: No Urine stream starts and stops?: No Trouble starting stream?: No Do you have to strain to urinate?: No Blood in urine?: No Urinary tract infection?: No Sexually transmitted disease?: No Injury to kidneys or bladder?: No Painful intercourse?: No Weak stream?: No Erection problems?: No Penile pain?: No Gastrointestinal Nausea?: No Vomiting?: No Indigestion/heartburn?: No Diarrhea?: No Constipation?: No Constitutional Fever: No Night sweats?: No Weight loss?: No Fatigue?: No Skin Skin rash/lesions?: No Itching?: No Eyes Blurred vision?: No Double vision?: No Ears/Nose/Throat Sore throat?: No Sinus problems?: Yes Hematologic/Lymphatic Swollen glands?: No Easy bruising?: No Cardiovascular Leg swelling?: No Chest pain?: No Respiratory Cough?: No Shortness of breath?: No Endocrine Excessive thirst?: No Musculoskeletal Back pain?: No Joint pain?: No Neurological Headaches?: No Dizziness?: No Psychologic Depression?: No Anxiety?: No   Physical Exam Blood pressure 138/70, pulse 66, height 5\' 9"  (1.753 m), weight 180 lb 6.4 oz (81.8 kg). Constitutional: Well nourished. Alert and oriented, No acute distress. HEENT: [place] AT, moist mucus membranes. Trachea midline, no masses. Cardiovascular: No clubbing, cyanosis, or edema. Respiratory: Normal respiratory effort, no increased work of breathing. Skin: No rashes, bruises or suspicious lesions. Lymph: No cervical or inguinal adenopathy. Neurologic: Grossly intact, no focal deficits, moving all 4 extremities. Psychiatric: Normal mood and affect.   1. Urinary incontinence  PTNS treatment:   The needle electrode  was  inserted into the lower, inner aspect of the patient's left leg. The surface electrode was placed on the inside arch of the foot on the treatment leg. The lead set was connected to the stimulator, and the needle electrode clip was connected to the needle electrode. The stimulator that produces an adjustable electrical pulse that travels to sacral nerve plexus via the tibial nerve was increased to 19 and a sensory response and toe flex was observed.   Treatment Plan:  Patient tolerated the PTNS treatment well for 30 minutes. The electrode was removed with out difficulty. He will return in one monthfor his maintenance PTNS treatment.

## 2016-09-16 ENCOUNTER — Encounter: Payer: Self-pay | Admitting: Urology

## 2016-09-16 ENCOUNTER — Ambulatory Visit (INDEPENDENT_AMBULATORY_CARE_PROVIDER_SITE_OTHER): Payer: Medicare Other | Admitting: Urology

## 2016-09-16 VITALS — BP 138/70 | HR 66 | Ht 69.0 in | Wt 180.4 lb

## 2016-09-16 DIAGNOSIS — N3941 Urge incontinence: Secondary | ICD-10-CM

## 2016-09-16 NOTE — Progress Notes (Signed)
PTNS  Session # 16  Health & Social Factors: no change Caffeine: 0 Alcohol: 0 Daytime voids #per day: 7 Night-time voids #per night: 1 Urgency: mild Incontinence Episodes #per day: 0 Ankle used: left Treatment Setting: 19 Feeling/ Response: toe flex Comments: patient tolerated well  Preformed By: Zara Council, PA-C  Assistant: Elberta Leatherwood, CMA  Follow Up: 1 month

## 2016-10-16 NOTE — Progress Notes (Signed)
1:29 PM   [name] [birth year] [id number]  Referring provider: Mar Daring, PA-C [place], [place] [address]  Chief Complaint  Patient presents with  . PTNS    HPI: Patient is an 83 year old Caucasian male who presents today for PTNS treatment for his urinary frequency. This will be a maintenance treatment.   Background history [name] has a long-standing history of urinary frequency and dribbling. He cannot recall a specific event when his urinary symptoms started. He states he has a constant dribbling of a small amount of urine. This occurs all day. He has not experienced an increase in the dribbling with coughing, sneezing, laughing, change in position or bending and lifting.   Looking back at his initial PTNS treatment, he was having day time voids every hour, three nightly voids and always dribbling.  So, he has made significant progress.   Today, he complains of nocturia x 1 and urgency.       After his last treatment, he is making 8 (worse) trips to the bathroom during the daytime. He states he gets up 1 (stable) times nightly. He is not experiencing burning or pain with urination. He has a mild urgency to urinate. He is having 0 (stable) episodes of incontinence daily.  He feels the treatments are beneficial and would like to continue the treatment.  He does not consume any caffeine beverages, he has 1-2 glasses of wine daily and he drinks eight 8 ounce glasses of water daily.    Previous Therapy: He has been tried on tamsulosin and Toviaz without improvement in his urinary symptoms.    He does not have any contraindications present for PTNS, such as: Pacemaker, Implantable defibrillator, History of abnormal bleeding or History of neuropathies or nerve damage.Contraindications present for PTNS   Discussed with patient possible complications of procedure, such as discomfort, bleeding at  insertion/stimulation site, procedure consent signed  Patient goals: The patient's goals were to reduce his urgency and his frequency.   ROS UROLOGY Frequent Urination?: No Hard to postpone urination?: No Burning/pain with urination?: No Get up at night to urinate?: No Leakage of urine?: Yes Urine stream starts and stops?: No Trouble starting stream?: No Do you have to strain to urinate?: No Blood in urine?: No Urinary tract infection?: No Sexually transmitted disease?: No Injury to kidneys or bladder?: No Painful intercourse?: No Weak stream?: No Erection problems?: No Penile pain?: No Gastrointestinal Nausea?: No Vomiting?: No Indigestion/heartburn?: No Diarrhea?: No Constipation?: No Constitutional Fever: No Night sweats?: No Weight loss?: No Fatigue?: No Skin Skin rash/lesions?: No Itching?: No Eyes Blurred vision?: No Double vision?: No Ears/Nose/Throat Sore throat?: No Sinus problems?: No Hematologic/Lymphatic Swollen glands?: No Easy bruising?: No Cardiovascular Leg swelling?: No Chest pain?: No Respiratory Cough?: No Shortness of breath?: No Endocrine Excessive thirst?: No Musculoskeletal Back pain?: No Joint pain?: No Neurological Headaches?: No Dizziness?: No Psychologic Depression?: No Anxiety?: No   Physical Exam Blood pressure (!) 149/58, pulse 83, height 5\' 9"  (1.753 m), weight 181 lb 11.2 oz (82.4 kg). Constitutional: Well nourished. Alert and oriented, No acute distress. HEENT: [place] AT, moist mucus membranes. Trachea midline, no masses. Cardiovascular: No clubbing, cyanosis, or edema. Respiratory: Normal respiratory effort, no increased work of breathing. Skin: No rashes, bruises or suspicious lesions. Lymph: No cervical or inguinal adenopathy. Neurologic: Grossly intact, no focal deficits, moving all 4 extremities. Psychiatric: Normal mood and affect.    1. Urinary incontinence  PTNS treatment:   The  needle electrode was  inserted into the lower, inner aspect of the patient's left leg. The surface electrode was placed on the inside arch of the foot on the treatment leg. The lead set was connected to the stimulator, and the needle electrode clip was connected to the needle electrode. The stimulator that produces an adjustable electrical pulse that travels to sacral nerve plexus via the tibial nerve was increased to 19 and a sensory response and toe flex was observed.   Treatment Plan:  Patient tolerated the PTNS treatment well for 30 minutes. The electrode was removed with out difficulty. He will return in one monthfor his maintenance PTNS treatment.

## 2016-10-17 ENCOUNTER — Encounter: Payer: Self-pay | Admitting: Urology

## 2016-10-17 ENCOUNTER — Ambulatory Visit (INDEPENDENT_AMBULATORY_CARE_PROVIDER_SITE_OTHER): Payer: Medicare Other | Admitting: Urology

## 2016-10-17 VITALS — BP 149/58 | HR 83 | Ht 69.0 in | Wt 181.7 lb

## 2016-10-17 DIAGNOSIS — N3941 Urge incontinence: Secondary | ICD-10-CM | POA: Diagnosis not present

## 2016-10-17 LAB — PTNS-PERCUTANEOUS TIBIAL NERVE STIMULATION: Scan Result: 13

## 2016-10-17 NOTE — Progress Notes (Signed)
PTNS  Session # Maint # 17  Health & Social Factors: No Change Caffeine: 0 Alcohol: 0 Daytime voids #per day: 8 Night-time voids #per night: 1 Urgency: Mild Incontinence Episodes #per day:  Ankle used: Left Treatment Setting: 13 Feeling/ Response: Sensory  Preformed [id number] McGowan PA-C and [place]  Follow Up:One Month

## 2016-11-01 ENCOUNTER — Other Ambulatory Visit: Payer: Self-pay

## 2016-11-01 ENCOUNTER — Other Ambulatory Visit: Payer: Medicare Other

## 2016-11-01 DIAGNOSIS — Z8546 Personal history of malignant neoplasm of prostate: Secondary | ICD-10-CM

## 2016-11-01 NOTE — Progress Notes (Signed)
11/04/2016 11:31 AM   [name] 13-May-1925 [id number]  Referring provider: Mar Daring, PA-C [address] [place], [place] [address]  Chief Complaint  Patient presents with  . Follow-up    6 month  History of Prostate Cancer    HPI: Patient is an 83 year old Caucasian male who presents today for a 6 month follow up for prostate cancer.  Prostate cancer Patient who underwent cryotherapy for a Gleason's 6 (10 cores + out of 12)-invasion present with a PSA of 4.3 and a 11% free PSA PCa in 2009 with [name] presents today for a 6 month follow up.  He has not had any weight loss, bone pain or appetite loss.  He generally feels well.  He has not had any fevers, chills, nauseas or vomiting.  He has not had any dysuria, gross hematuria or suprapubic pain.  His I PSS score today is 7/2.   His previous I PSS was 8/3.  His major complaints are urgency and nocturia x 2.        IPSS    Row Name 11/04/16 1100         International Prostate Symptom Score   How often have you had the sensation of not emptying your bladder? About half the time     How often have you had to urinate less than every two hours? Less than 1 in 5 times     How often have you found you stopped and started again several times when you urinated? Not at All     How often have you found it difficult to postpone urination? Less than 1 in 5 times     How often have you had a weak urinary stream? Not at All     How often have you had to strain to start urination? Not at All     How many times did you typically get up at night to urinate? 2 Times     Total IPSS Score 7       Quality of Life due to urinary symptoms   If you were to spend the rest of your life with your urinary condition just the way it is now how would you feel about that? Mostly Satisfied        Score:  1-7 Mild 8-19 Moderate 20-35 Severe    PMH: Past Medical History:  Diagnosis Date  . Arthritis   . Bladder  outlet obstruction   . Erectile dysfunction   . GERD (gastroesophageal reflux disease)   . Hard of hearing   . HBP (high blood pressure)   . History of nephrolithiasis   . HLD (hyperlipidemia)   . Incomplete bladder emptying   . Nocturia   . Osteoporosis   . Prostate cancer ([place])   . Shingles 2014  . Sinus problem   . Stomach ulcer   . Stroke ([place]) 2012  . Urinary frequency   . Urinary leakage     Surgical History: Past Surgical History:  Procedure Laterality Date  . CATARACT EXTRACTION  2000, 2001  . CHOLECYSTECTOMY  1972  . PROSTATE SURGERY  2008/2009  . TONSILLECTOMY    . UMBILICAL HERNIA REPAIR      Home Medications:  Allergies as of 11/04/2016      Reactions   Toviaz [fesoterodine Fumarate Er] Other (See Comments)   Acid reflux      Medication List       Accurate as of 11/04/16  11:31 AM. Always use your most recent med list.          acetaminophen 325 MG tablet Commonly known as:  TYLENOL Take 650 mg by mouth every 6 (six) hours as needed.   Aloe Vera 25 MG Caps Take by mouth.   amLODipine 2.5 MG tablet Commonly known as:  NORVASC TAKE ONE TABLET BY MOUTH ONCE DAILY   aspirin 81 MG tablet Take 81 mg by mouth daily.   docusate sodium 100 MG capsule Commonly known as:  COLACE Take 1 capsule (100 mg total) by mouth 2 (two) times daily as needed for mild constipation or moderate constipation.   escitalopram 5 MG tablet Commonly known as:  LEXAPRO Take 1 tablet (5 mg total) by mouth at bedtime.   glucosamine-chondroitin 500-400 MG tablet Take 1 tablet by mouth 2 (two) times daily.   hydrochlorothiazide 12.5 MG capsule Commonly known as:  MICROZIDE TAKE ONE CAPSULE BY MOUTH ONCE DAILY   losartan 25 MG tablet Commonly known as:  COZAAR TAKE ONE TABLET BY MOUTH ONCE DAILY   multivitamin capsule Take 1 capsule by mouth daily.   OVER THE COUNTER MEDICATION Take by mouth 1 day or 1 dose. ESTER C   tamsulosin 0.4 MG Caps capsule Commonly  known as:  FLOMAX TAKE ONE CAPSULE BY MOUTH ONCE DAILY   vitamin B-12 1000 MCG tablet Commonly known as:  CYANOCOBALAMIN Take by mouth.   VITAMIN C PO Take by mouth.       Allergies:  Allergies  Allergen Reactions  . Toviaz [Fesoterodine Fumarate Er] Other (See Comments)    Acid reflux    Family History: Family History  Problem Relation Age of Onset  . Cancer Mother   . Stroke Father   . Stroke Sister   . Prostate cancer Brother   . Kidney disease Neg Hx   . Kidney cancer Neg Hx   . Bladder Cancer Neg Hx     Social History:  reports that he quit smoking about 54 years ago. He quit after 10.00 years of use. He has never used smokeless tobacco. He reports that he does not drink alcohol or use drugs.  ROS: UROLOGY Frequent Urination?: Yes Hard to postpone urination?: No Burning/pain with urination?: No Get up at night to urinate?: Yes Leakage of urine?: No Urine stream starts and stops?: No Trouble starting stream?: No Do you have to strain to urinate?: No Blood in urine?: No Urinary tract infection?: No Sexually transmitted disease?: No Injury to kidneys or bladder?: No Painful intercourse?: No Weak stream?: No Erection problems?: No Penile pain?: No  Gastrointestinal Nausea?: No Vomiting?: No Indigestion/heartburn?: No Diarrhea?: No Constipation?: No  Constitutional Fever: No Night sweats?: No Weight loss?: No Fatigue?: No  Skin Skin rash/lesions?: No Itching?: No  Eyes Blurred vision?: No Double vision?: No  Ears/Nose/Throat Sore throat?: No Sinus problems?: Yes  Hematologic/Lymphatic Swollen glands?: No Easy bruising?: No  Cardiovascular Leg swelling?: No Chest pain?: No  Respiratory Cough?: No Shortness of breath?: No  Endocrine Excessive thirst?: No  Musculoskeletal Back pain?: No Joint pain?: No  Neurological Headaches?: No Dizziness?: No  Psychologic Depression?: No Anxiety?: No  Physical Exam: BP (!)  152/67   Pulse 62   Ht 5\' 9"  (1.753 m)   Wt 182 lb (82.6 kg)   BMI 26.88 kg/m   Constitutional: Well nourished. Alert and oriented, No acute distress. HEENT: [place] AT, moist mucus membranes. Trachea midline, no masses. Cardiovascular: No clubbing, cyanosis, or edema. Respiratory: Normal  respiratory effort, no increased work of breathing. GI: Abdomen is soft, non tender, non distended, no abdominal masses. Liver and spleen not palpable.  No hernias appreciated.  Stool sample for occult testing is not indicated.   GU: No CVA tenderness.  No bladder fullness or masses.  Patient with circumcised phallus.  Urethral meatus is patent.  No penile discharge. No penile lesions or rashes. Scrotum without lesions, cysts, rashes and/or edema.  Testicles are located scrotally bilaterally. No masses are appreciated in the testicles. Left and right epididymis are normal. Rectal: Patient with  normal sphincter tone. Anus and perineum without scarring or rashes. No rectal masses are appreciated. Prostate is small, firm, no nodules are appreciated. Seminal vesicles are normal. Skin: No rashes, bruises or suspicious lesions. Lymph: No cervical or inguinal adenopathy. Neurologic: Grossly intact, no focal deficits, moving all 4 extremities. Psychiatric: Normal mood and affect.  Laboratory Data: PSA History  0.6 ng/mL on 04/29/2016  0.6 ng/mL on 11/01/2016  Lab Results  Component Value Date   WBC 4.9 09/15/2015   HGB 14.6 04/25/2012   HCT 41.5 09/15/2015   MCV 92 09/15/2015   PLT 224 09/15/2015    Lab Results  Component Value Date   CREATININE 0.86 09/15/2015    Lab Results  Component Value Date   HGBA1C 5.9 05/09/2016       Component Value Date/Time   CHOL 147 09/15/2015 1043   HDL 59 09/15/2015 1043   LDLCALC 74 09/15/2015 1043    Lab Results  Component Value Date   AST 26 09/15/2015   Lab Results  Component Value Date   ALT 17 09/15/2015    Assessment & Plan:    1. Prostate  cancer: Patient underwent cryotherapy for a Gleason's 6 (10 cores + out of 12)-invasion present with a PSA of 4.3 and a 11% free PSA PCa in 2009. His most recent PSA was 0.6 ng/mL on 11/01/2016..   IPSS is stable.  He will return in 6 months for a repeated IPSS score, PSA and exam.     Return in about 6 months (around 05/06/2017) for IPSS, PSA and exam.  These notes generated with voice recognition software. I apologize for typographical errors.  Zara Council, [facility] [address] [place], [place] [address] [phone]

## 2016-11-02 ENCOUNTER — Other Ambulatory Visit: Payer: Self-pay | Admitting: Physician Assistant

## 2016-11-02 DIAGNOSIS — I10 Essential (primary) hypertension: Secondary | ICD-10-CM

## 2016-11-02 DIAGNOSIS — E7800 Pure hypercholesterolemia, unspecified: Secondary | ICD-10-CM

## 2016-11-02 LAB — PSA: Prostate Specific Ag, Serum: 0.6 ng/mL (ref 0.0–4.0)

## 2016-11-04 ENCOUNTER — Telehealth: Payer: Self-pay

## 2016-11-04 ENCOUNTER — Ambulatory Visit (INDEPENDENT_AMBULATORY_CARE_PROVIDER_SITE_OTHER): Payer: Medicare Other | Admitting: Urology

## 2016-11-04 ENCOUNTER — Encounter: Payer: Self-pay | Admitting: Urology

## 2016-11-04 VITALS — BP 152/67 | HR 62 | Ht 69.0 in | Wt 182.0 lb

## 2016-11-04 DIAGNOSIS — C61 Malignant neoplasm of prostate: Secondary | ICD-10-CM | POA: Diagnosis not present

## 2016-11-04 NOTE — Telephone Encounter (Signed)
LOV 09/13/2016.

## 2016-11-04 NOTE — Telephone Encounter (Signed)
-----   Message from [name], PA-C sent at 11/02/2016 11:39 AM EDT ----- Patient's PSA is stable at 0.6   We will see him in 6 months.  PSA to be drawn before his next appointment.

## 2016-11-04 NOTE — Telephone Encounter (Signed)
Pt was given results today at appt.

## 2016-11-08 DIAGNOSIS — H18822 Corneal disorder due to contact lens, left eye: Secondary | ICD-10-CM | POA: Diagnosis not present

## 2016-11-13 NOTE — Progress Notes (Deleted)
    9:[place] 1924/08/01 [id number]  Referring provider: Mar Daring, PA-C [name], [name] [address]  No chief complaint on file.   HPI: Patient is an 83 year old Caucasian male who presents today for PTNS treatment for his urinary frequency. This will be a maintenance treatment.   Background history [name] has a long-standing history of urinary frequency and dribbling. He cannot recall a specific event when his urinary symptoms started. He states he has a constant dribbling of a small amount of urine. This occurs all day. He has not experienced an increase in the dribbling with coughing, sneezing, laughing, change in position or bending and lifting.   Looking back at his initial PTNS treatment, he was having day time voids every hour, three nightly voids and always dribbling.  So, he has made significant progress.   Today, he complains of nocturia x 1 and urgency.       After his last treatment, he is making *** (***) trips to the bathroom during the daytime. He states he gets up *** (***) times nightly. He is not experiencing burning or pain with urination. He has a mild urgency to urinate. He is having *** (***) episodes of incontinence daily.  He feels the treatments are beneficial and would like to continue the treatment.  He does not consume any caffeine beverages, he has 1-2 glasses of wine daily and he drinks eight 8 ounce glasses of water daily.    Previous Therapy: He has been tried on tamsulosin and Toviaz without improvement in his urinary symptoms.    He does not have any contraindications present for PTNS, such as: Pacemaker, Implantable defibrillator, History of abnormal bleeding or History of neuropathies or nerve damage.Contraindications present for PTNS   Discussed with patient possible complications of procedure, such as discomfort, bleeding at insertion/stimulation site, procedure  consent signed  Patient goals: The patient's goals were to reduce his urgency and his frequency.   ROS     Physical Exam *** Constitutional: Well nourished. Alert and oriented, No acute distress. HEENT: [place] AT, moist mucus membranes. Trachea midline, no masses. Cardiovascular: No clubbing, cyanosis, or edema. Respiratory: Normal respiratory effort, no increased work of breathing. Skin: No rashes, bruises or suspicious lesions. Lymph: No cervical or inguinal adenopathy. Neurologic: Grossly intact, no focal deficits, moving all 4 extremities. Psychiatric: Normal mood and affect.    1. Urinary incontinence  PTNS treatment:   The needle electrode was inserted into the lower, inner aspect of the patient's *** leg. The surface electrode was placed on the inside arch of the foot on the treatment leg. The lead set was connected to the stimulator, and the needle electrode clip was connected to the needle electrode. The stimulator that produces an adjustable electrical pulse that travels to sacral nerve plexus via the tibial nerve was increased to *** and a ***.    Treatment Plan:  Patient tolerated the PTNS treatment well for 30 minutes. The electrode was removed with out difficulty. He will return in one monthfor his maintenance PTNS treatment.

## 2016-11-14 ENCOUNTER — Ambulatory Visit: Payer: Medicare Other | Admitting: Urology

## 2016-11-14 ENCOUNTER — Encounter: Payer: Self-pay | Admitting: Urology

## 2016-11-18 ENCOUNTER — Ambulatory Visit: Payer: Medicare Other | Admitting: Family Medicine

## 2016-12-22 NOTE — Progress Notes (Signed)
3:26 PM   [name] January 06, 1925 [id number]  Referring provider: Mar Daring, PA-C [place], [place] [address]  Chief Complaint  Patient presents with  . PTNS    Urge incontinence    HPI: Patient is an 83 year old Caucasian male who presents today for PTNS treatment for his urinary frequency. This will be a maintenance treatment.   Background history [name] has a long-standing history of urinary frequency and dribbling. He cannot recall a specific event when his urinary symptoms started. He states he has a constant dribbling of a small amount of urine. This occurs all day. He has not experienced an increase in the dribbling with coughing, sneezing, laughing, change in position or bending and lifting.   Looking back at his initial PTNS treatment, he was having day time voids every hour, three nightly voids and always dribbling.  So, he has made significant progress.   After his last treatment, he is making 8 (stable) trips to the bathroom during the daytime. He states he gets up 1 (stable) times nightly. He is not experiencing burning or pain with urination. He has a mild urgency to urinate. He is having 0 (stable) episodes of incontinence daily.  He feels the treatments are beneficial and would like to continue the treatment.  He feels he has improved 80%.    He does not consume any caffeine beverages, he has 1-2 glasses of wine daily and he drinks eight 8 ounce glasses of water daily.    Previous Therapy: He has been tried on tamsulosin and Toviaz without improvement in his urinary symptoms.    He does not have any contraindications present for PTNS, such as: Pacemaker, Implantable defibrillator, History of abnormal bleeding or History of neuropathies or nerve damage.Contraindications present for PTNS   Discussed with patient possible complications of procedure, such as discomfort, bleeding at insertion/stimulation  site, procedure consent signed  Patient goals: The patient's goals were to reduce his urgency and his frequency.   ROS UROLOGY Frequent Urination?: No Hard to postpone urination?: Yes Burning/pain with urination?: No Get up at night to urinate?: No Leakage of urine?: No Urine stream starts and stops?: No Trouble starting stream?: No Do you have to strain to urinate?: No Blood in urine?: No Urinary tract infection?: No Sexually transmitted disease?: No Injury to kidneys or bladder?: No Painful intercourse?: No Weak stream?: No Erection problems?: No Penile pain?: No Gastrointestinal Nausea?: No Vomiting?: No Indigestion/heartburn?: No Diarrhea?: No Constipation?: No Constitutional Fever: No Night sweats?: No Weight loss?: No Fatigue?: No Skin Skin rash/lesions?: No Itching?: No Eyes Blurred vision?: No Double vision?: No Ears/Nose/Throat Sore throat?: No Sinus problems?: No Hematologic/Lymphatic Swollen glands?: No Easy bruising?: No Cardiovascular Leg swelling?: No Chest pain?: No Respiratory Cough?: No Shortness of breath?: No Endocrine Excessive thirst?: No Musculoskeletal Back pain?: No Joint pain?: No Neurological Headaches?: No Dizziness?: No Psychologic Depression?: No Anxiety?: No   Physical Exam Blood pressure 130/69, pulse 80, height 5\' 7"  (1.702 m), weight 185 lb 6.4 oz (84.1 kg). Constitutional: Well nourished. Alert and oriented, No acute distress. HEENT: [place] AT, moist mucus membranes. Trachea midline, no masses. Cardiovascular: No clubbing, cyanosis, or edema. Respiratory: Normal respiratory effort, no increased work of breathing. Skin: No rashes, bruises or suspicious lesions. Lymph: No cervical or inguinal adenopathy. Neurologic: Grossly intact, no focal deficits, moving all 4 extremities. Psychiatric: Normal mood and affect.    1. Urinary incontinence  PTNS treatment:   The needle electrode  was inserted into the lower,  inner aspect of the patient's left leg. The surface electrode was placed on the inside arch of the foot on the treatment leg. The lead set was connected to the stimulator, and the needle electrode clip was connected to the needle electrode. The stimulator that produces an adjustable electrical pulse that travels to sacral nerve plexus via the tibial nerve was increased to 11 and a sensory response and toe flex was observed.   Treatment Plan:  Patient tolerated the PTNS treatment well for 30 minutes. The electrode was removed with out difficulty. He will return in one monthfor his maintenance PTNS treatment.

## 2016-12-23 ENCOUNTER — Encounter: Payer: Self-pay | Admitting: Urology

## 2016-12-23 ENCOUNTER — Ambulatory Visit (INDEPENDENT_AMBULATORY_CARE_PROVIDER_SITE_OTHER): Payer: Medicare Other | Admitting: Urology

## 2016-12-23 VITALS — BP 130/69 | HR 80 | Ht 67.0 in | Wt 185.4 lb

## 2016-12-23 DIAGNOSIS — N3941 Urge incontinence: Secondary | ICD-10-CM | POA: Diagnosis not present

## 2016-12-23 LAB — PTNS-PERCUTANEOUS TIBIAL NERVE STIMULATION: Scan Result: 11

## 2016-12-23 NOTE — Progress Notes (Signed)
PTNS  Session # Maint # 18  Health & Social Factors: No Change Caffeine: 0 Alcohol: 0 Daytime voids #per day:8 Night-time voids #per night: 1 Urgency: Mild Incontinence Episodes #per day: 0 Ankle used: left Treatment Setting: 11 Feeling/ Response: Both  Preformed By: Zara Council PA-C  Assistant: Lyndee Hensen CMA  Follow Up: One Month

## 2016-12-30 ENCOUNTER — Encounter: Payer: Self-pay | Admitting: Family Medicine

## 2016-12-30 ENCOUNTER — Ambulatory Visit (INDEPENDENT_AMBULATORY_CARE_PROVIDER_SITE_OTHER): Payer: Medicare Other | Admitting: Family Medicine

## 2016-12-30 VITALS — BP 92/52 | HR 71 | Temp 98.0°F | Wt 187.6 lb

## 2016-12-30 DIAGNOSIS — L509 Urticaria, unspecified: Secondary | ICD-10-CM

## 2016-12-30 DIAGNOSIS — M19012 Primary osteoarthritis, left shoulder: Secondary | ICD-10-CM | POA: Diagnosis not present

## 2016-12-30 MED ORDER — PREDNISONE 10 MG (21) PO TBPK: ORAL_TABLET | ORAL | 0 refills | Status: DC

## 2016-12-30 NOTE — Progress Notes (Signed)
Patient: [name] Male    DOB: December 13, 1924   83 y.o.   MRN: [id number] Visit Date: 12/30/2016  Today's Provider: Vernie Murders, PA   Chief Complaint  Patient presents with  . Rash   Subjective:    Rash  This is a chronic problem. The current episode started more than 1 month ago. The problem has been gradually worsening since onset. The affected locations include the right arm, left arm and chest. The rash is characterized by redness and itchiness. He was exposed to nothing. Treatments tried: Hydrocortisone, Listerine, and OTC creams. The treatment provided no relief.   Patient Active Problem List   Diagnosis Date Noted  . Urinary frequency 09/18/2015  . Allergic rhinitis 06/27/2015  . Cerebral infarction ([place]) 06/27/2015  . DD (diverticular disease) 06/27/2015  . Accumulation of fluid in tissues 06/27/2015  . Benign essential HTN 06/27/2015  . Hypercholesterolemia 06/27/2015  . Burning or prickling sensation 06/27/2015  . Fungal infection of nail 06/27/2015  . Foot pain, right 04/10/2015  . Abnormal glucose 03/15/2015  . Prostate cancer ([place]) 12/18/2014  . Arthritis, degenerative 01/12/2014  . H/O malignant neoplasm of prostate 12/03/2012  . Urge incontinence 11/09/2012  . Obstruction of urinary tract 11/09/2012  . Dermatophytosis of groin 09/29/2012  . 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 Incomplete bladder emptying 09/29/2012   Past Surgical History:  Procedure Laterality Date  . CATARACT EXTRACTION  2000, 2001  . CHOLECYSTECTOMY  1972  . PROSTATE SURGERY  2008/2009  . TONSILLECTOMY    . UMBILICAL HERNIA REPAIR     Family History  Problem Relation Age of Onset  . Cancer Mother   . Stroke Father   . Stroke Sister   . Prostate cancer Brother   . Kidney disease Neg Hx   . Kidney cancer Neg Hx   . Bladder Cancer Neg Hx    Allergies  Allergen Reactions  . Toviaz [Fesoterodine Fumarate Er] Other (See Comments)    Acid reflux       Previous Medications   ACETAMINOPHEN (TYLENOL) 325 MG TABLET    Take 650 mg by mouth every 6 (six) hours as needed.   ALOE VERA 25 MG CAPS    Take 100 mg by mouth daily.    AMLODIPINE (NORVASC) 2.5 MG TABLET    TAKE ONE TABLET BY MOUTH ONCE DAILY   ASCORBIC ACID (VITAMIN C PO)    Take by mouth.   ASPIRIN 81 MG TABLET    Take 81 mg by mouth daily.   DOCUSATE SODIUM (COLACE) 100 MG CAPSULE    Take 1 capsule (100 mg total) by mouth 2 (two) times daily as needed for mild constipation or moderate constipation.   ESCITALOPRAM (LEXAPRO) 5 MG TABLET    Take 1 tablet (5 mg total) by mouth at bedtime.   GLUCOSAMINE-CHONDROITIN 500-400 MG TABLET    Take 1 tablet by mouth 2 (two) times daily.    HYDROCHLOROTHIAZIDE (MICROZIDE) 12.5 MG CAPSULE    TAKE ONE CAPSULE BY MOUTH ONCE DAILY   LOSARTAN (COZAAR) 25 MG TABLET    TAKE ONE TABLET BY MOUTH ONCE DAILY   MULTIPLE VITAMIN (MULTIVITAMIN) CAPSULE    Take 1 capsule by mouth daily.   OVER THE COUNTER MEDICATION    Take by mouth 1 day or 1 dose. ESTER C   TAMSULOSIN (FLOMAX) 0.4 MG CAPS CAPSULE    TAKE ONE CAPSULE BY MOUTH ONCE DAILY   VITAMIN B-12 (CYANOCOBALAMIN) 1000 MCG TABLET  Take by mouth.    Review of Systems  Constitutional: Negative.   Respiratory: Negative.   Cardiovascular: Negative.   Skin: Positive for rash.    Social History  Substance Use Topics  . Smoking status: Former Smoker    Years: 10.00    Quit date: 07/08/1962  . Smokeless tobacco: Never Used     Comment: quit 1964  . Alcohol use No   Objective:   BP (!) 92/52 (BP Location: Right Arm, Patient Position: Sitting, Cuff Size: Normal)   Pulse 71   Temp 98 F (36.7 C) (Oral)   Wt 187 lb 9.6 oz (85.1 kg)   SpO2 97%   BMI 29.38 kg/m   Physical Exam  Constitutional: He is oriented to person, place, and time. He appears well-developed and well-nourished. No distress.  HENT:  Head: Normocephalic and atraumatic.  Right Ear: Hearing normal.  Left Ear: Hearing normal.   Nose: Nose normal.  Eyes: Conjunctivae and lids are normal. Right eye exhibits no discharge. Left eye exhibits no discharge. No scleral icterus.  Neck: Neck supple.  Cardiovascular: Normal rate and regular rhythm.   Pulmonary/Chest: Effort normal. No respiratory distress.  Abdominal: Soft. Bowel sounds are normal.  Musculoskeletal: Normal range of motion.  Neurological: He is alert and oriented to person, place, and time.  Skin: Skin is intact. Rash noted. No lesion noted.  Pruritic whelps on both arms with slight rash at the manubrium. Pink coloration.  Psychiatric: He has a normal mood and affect. His speech is normal and behavior is normal. Thought content normal.      Assessment & Plan:     1. Urticarial rash Onset over the past 3-4 weeks. Rash on arms and anterior base of neck. No known new detergents, lotions or soaps. May use OTC antihistamine for itching and add prednisone taper. Recheck prn. - predniSONE (STERAPRED UNI-PAK 21 TAB) 10 MG (21) TBPK tablet; Taper dose by mouth as directed on the package (6,5,4,3,2,1)  Dispense: 21 tablet; Refill: 0  2. Arthritis of left shoulder region Pain in left shoulder(posteriorly) to reach over head or behind back over the past year. No known injury. Goof passive ROM. Pain occurs with active movement overhead or behind back. Prednisone taper should help with inflammation. Recheck prn for x-rays and lab tests. - predniSONE (STERAPRED UNI-PAK 21 TAB) 10 MG (21) TBPK tablet; Taper dose by mouth as directed on the package (6,5,4,3,2,1)  Dispense: 21 tablet; Refill: 0

## 2016-12-30 NOTE — Patient Instructions (Signed)
Hives Hives (urticaria) are itchy, red, swollen areas on your skin. Hives can appear on any part of your body and can vary in size. They can be as small as the tip of a pen or much larger. Hives often fade within 24 hours (acute hives). In other cases, new hives appear after old ones fade. This cycle can continue for several days or weeks (chronic hives). Hives result from your body's reaction to an irritant or to something that you are allergic to (trigger). When you are exposed to a trigger, your body releases a chemical (histamine) that causes redness, itching, and swelling. You can get hives immediately after being exposed to a trigger or hours later. Hives do not spread from person to person (are not contagious). Your hives may get worse with scratching, exercise, and emotional stress. What are the causes? Causes of this condition include:  Allergies to certain foods or ingredients.  Insect bites or stings.  Exposure to pollen or pet dander.  Contact with latex or chemicals.  Spending time in sunlight, heat, or cold (exposure).  Exercise.  Stress. You can also get hives from some medical conditions and treatments. These include:  Viruses, including the common cold.  Bacterial infections, such as urinary tract infections and strep throat.  Disorders such as vasculitis, lupus, or thyroid disease.  Certain medications.  Allergy shots.  Blood transfusions. Sometimes, the cause of hives is not known (idiopathic hives). What increases the risk? This condition is more likely to develop in:  Women.  People who have food allergies, especially to citrus fruits, milk, eggs, peanuts, tree nuts, or shellfish.  People who are allergic to:  Medicines.  Latex.  Insects.  Animals.  Pollen.  People who have certain medical conditions, includinglupus or thyroid disease. What are the signs or symptoms? The main symptom of this condition is raised, itchyred or white bumps or  patches on your skin. These areas may:  Become large and swollen (welts).  Change in shape and location, quickly and repeatedly.  Be separate hives or connect over a large area of skin.  Sting or become painful.  Turn white when pressed in the center (blanch). In severe cases, yourhands, feet, and face may also become swollen. This may occur if hives develop deeper in your skin. How is this diagnosed? This condition is diagnosed based on your symptoms, medical history, and physical exam. Your skin, urine, or blood may be tested to find out what is causing your hives and to rule out other health issues. Your health care provider may also remove a small sample of skin from the affected area and examine it under a microscope (biopsy). How is this treated? Treatment depends on the severity of your condition. Your health care provider may recommend using cool, wet cloths (cool compresses) or taking cool showers to relieve itching. Hives are sometimes treated with medicines, including:  Antihistamines.  Corticosteroids.  Antibiotics.  An injectable medicine (omalizumab). Your health care provider may prescribe this if you have chronic idiopathic hives and you continue to have symptoms even after treatment with antihistamines. Severe cases may require an emergency injection of adrenaline (epinephrine) to prevent a life-threatening allergic reaction (anaphylaxis). Follow these instructions at home: Medicines  Take or apply over-the-counter and prescription medicines only as told by your health care provider.  If you were prescribed an antibiotic medicine, use it as told by your health care provider. Do not stop taking the antibiotic even if you start to feel better. Skin Care    Apply cool compresses to the affected areas.  Do not scratch or rub your skin. General instructions  Do not take hot showers or baths. This can make itching worse.  Do not wear tight-fitting clothing.  Use  sunscreen and wear protective clothing when you are outside.  Avoid any substances that cause your hives. Keep a journal to help you track what causes your hives. Write down:  What medicines you take.  What you eat and drink.  What products you use on your skin.  Keep all follow-up visits as told by your health care provider. This is important. Contact a health care provider if:  Your symptoms are not controlled with medicine.  Your joints are painful or swollen. Get help right away if:  You have a fever.  You have pain in your abdomen.  Your tongue or lips are swollen.  Your eyelids are swollen.  Your chest or throat feels tight.  You have trouble breathing or swallowing. These symptoms may represent a serious problem that is an emergency. Do not wait to see if the symptoms will go away. Get medical help right away. Call your local emergency services (911 in the [place] Do not drive yourself to the hospital.  This information is not intended to replace advice given to you by your health care provider. Make sure you discuss any questions you have with your health care provider. Document Released: 06/24/2005 Document Revised: 11/22/2015 Document Reviewed: 04/12/2015 Elsevier Interactive Patient Education  2017 [employer]  

## 2017-01-20 NOTE — Progress Notes (Deleted)
    10:20 PM   Fernado Brigante Fiser Mar 03, 1925 [id number]  Referring provider: Mar Daring, PA-C [address] [place] [place], [place] [address]  No chief complaint on file.   HPI: Patient is an 83 year old Caucasian male who presents today for PTNS treatment for his urinary frequency. This will be a maintenance treatment.   Background history Mr. Mcfarlane has a long-standing history of urinary frequency and dribbling. He cannot recall a specific event when his urinary symptoms started. He states he has a constant dribbling of a small amount of urine. This occurs all day. He has not experienced an increase in the dribbling with coughing, sneezing, laughing, change in position or bending and lifting.   Looking back at his initial PTNS treatment, he was having day time voids every hour, three nightly voids and always dribbling.  So, he has made significant progress.   After his last treatment, he is making 8 (stable) trips to the bathroom during the daytime. He states he gets up 1 (stable) times nightly. He is not experiencing burning or pain with urination. He has a mild urgency to urinate. He is having 0 (stable) episodes of incontinence daily.  He feels the treatments are beneficial and would like to continue the treatment.  He feels he has improved 80%.    He does not consume any caffeine beverages, he has 1-2 glasses of wine daily and he drinks eight 8 ounce glasses of water daily.    Previous Therapy: He has been tried on tamsulosin and Toviaz without improvement in his urinary symptoms.    He does not have any contraindications present for PTNS, such as: Pacemaker, Implantable defibrillator, History of abnormal bleeding or History of neuropathies or nerve damage.Contraindications present for PTNS   Discussed with patient possible complications of procedure, such as discomfort, bleeding at insertion/stimulation site, procedure consent signed  Patient  goals: The patient's goals were to reduce his urgency and his frequency.   ROS     Physical Exam There were no vitals taken for this visit. *** Constitutional: Well nourished. Alert and oriented, No acute distress. HEENT: [place] AT, moist mucus membranes. Trachea midline, no masses. Cardiovascular: No clubbing, cyanosis, or edema. Respiratory: Normal respiratory effort, no increased work of breathing. Skin: No rashes, bruises or suspicious lesions. Lymph: No cervical or inguinal adenopathy. Neurologic: Grossly intact, no focal deficits, moving all 4 extremities. Psychiatric: Normal mood and affect.    1. Urinary incontinence  PTNS treatment:   The needle electrode was inserted into the lower, inner aspect of the patient's left leg. The surface electrode was placed on the inside arch of the foot on the treatment leg. The lead set was connected to the stimulator, and the needle electrode clip was connected to the needle electrode. The stimulator that produces an adjustable electrical pulse that travels to sacral nerve plexus via the tibial nerve was increased to 11 and a sensory response and toe flex was observed.   Treatment Plan:  Patient tolerated the PTNS treatment well for 30 minutes. The electrode was removed with out difficulty. He will return in one monthfor his maintenance PTNS treatment.

## 2017-01-21 ENCOUNTER — Ambulatory Visit: Payer: Medicare Other | Admitting: Urology

## 2017-01-21 ENCOUNTER — Encounter: Payer: Self-pay | Admitting: Urology

## 2017-01-30 ENCOUNTER — Encounter: Payer: Self-pay | Admitting: Family Medicine

## 2017-01-30 ENCOUNTER — Ambulatory Visit (INDEPENDENT_AMBULATORY_CARE_PROVIDER_SITE_OTHER): Payer: Medicare Other | Admitting: Family Medicine

## 2017-01-30 VITALS — BP 108/54 | HR 64 | Temp 97.5°F | Resp 16 | Wt 186.0 lb

## 2017-01-30 DIAGNOSIS — R5383 Other fatigue: Secondary | ICD-10-CM | POA: Diagnosis not present

## 2017-01-30 DIAGNOSIS — R252 Cramp and spasm: Secondary | ICD-10-CM | POA: Diagnosis not present

## 2017-01-30 DIAGNOSIS — M79672 Pain in left foot: Secondary | ICD-10-CM

## 2017-01-30 DIAGNOSIS — R5381 Other malaise: Secondary | ICD-10-CM | POA: Diagnosis not present

## 2017-01-30 NOTE — Progress Notes (Signed)
Patient: [name] Male    DOB: 1925/01/16   83 y.o.   MRN: [id number] Visit Date: 01/30/2017  Today's Provider: Vernie Murders, PA   Chief Complaint  Patient presents with  . Leg Pain   Subjective:    Leg Pain   Pain location: Right leg was affected by CVA several years ago, and now his left leg is bothering him as well. Pain is also present in buttocks now as well. Quality: "throbbing" The pain is at a severity of 6/10. The pain is moderate. The pain has been worsening since onset. Pertinent negatives include no inability to bear weight, loss of motion, loss of sensation, muscle weakness, numbness or tingling. Exacerbated by: sitting too long. Treatments tried: Laser Beam Therapy and magnets (recommended by chiropractor). Improvement on treatment: Treatment has been effective at the beginning, but is now ineffective.   Patient Active Problem List   Diagnosis Date Noted  . Urinary frequency 09/18/2015  . Allergic rhinitis 06/27/2015  . Cerebral infarction ([place]) 06/27/2015  . DD (diverticular disease) 06/27/2015  . Accumulation of fluid in tissues 06/27/2015  . Benign essential HTN 06/27/2015  . Hypercholesterolemia 06/27/2015  . Burning or prickling sensation 06/27/2015  . Fungal infection of nail 06/27/2015  . Foot pain, right 04/10/2015  . Abnormal glucose 03/15/2015  . Prostate cancer ([place]) 12/18/2014  . Arthritis, degenerative 01/12/2014  . H/O malignant neoplasm of prostate 12/03/2012  . Urge incontinence 11/09/2012  . Obstruction of urinary tract 11/09/2012  . Dermatophytosis of groin 09/29/2012  . 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 Incomplete bladder emptying 09/29/2012   Past Surgical History:  Procedure Laterality Date  . CATARACT EXTRACTION  2000, 2001  . CHOLECYSTECTOMY  1972  . PROSTATE SURGERY  2008/2009  . TONSILLECTOMY    . UMBILICAL HERNIA REPAIR     Family History  Problem Relation Age of Onset  . Cancer Mother     . Stroke Father   . Stroke Sister   . Prostate cancer Brother   . Kidney disease Neg Hx   . Kidney cancer Neg Hx   . Bladder Cancer Neg Hx       Allergies  Allergen Reactions  . Toviaz [Fesoterodine Fumarate Er] Other (See Comments)    Acid reflux     Current Outpatient Prescriptions:  .  acetaminophen (TYLENOL) 325 MG tablet, Take 650 mg by mouth every 6 (six) hours as needed., Disp: , Rfl:  .  Aloe Vera 25 MG CAPS, Take 100 mg by mouth daily. , Disp: , Rfl:  .  amLODipine (NORVASC) 2.5 MG tablet, TAKE ONE TABLET BY MOUTH ONCE DAILY, Disp: 90 tablet, Rfl: 1 .  aspirin 81 MG tablet, Take 81 mg by mouth daily., Disp: , Rfl:  .  docusate sodium (COLACE) 100 MG capsule, Take 1 capsule (100 mg total) by mouth 2 (two) times daily as needed for mild constipation or moderate constipation., Disp: 30 capsule, Rfl: 0 .  escitalopram (LEXAPRO) 5 MG tablet, Take 1 tablet (5 mg total) by mouth at bedtime., Disp: 90 tablet, Rfl: 1 .  glucosamine-chondroitin 500-400 MG tablet, Take 1 tablet by mouth 2 (two) times daily. , Disp: , Rfl:  .  hydrochlorothiazide (MICROZIDE) 12.5 MG capsule, TAKE ONE CAPSULE BY MOUTH ONCE DAILY, Disp: 90 capsule, Rfl: 1 .  losartan (COZAAR) 25 MG tablet, TAKE ONE TABLET BY MOUTH ONCE DAILY, Disp: 90 tablet, Rfl: 1 .  Multiple Vitamin (MULTIVITAMIN) capsule, Take  1 capsule by mouth daily., Disp: , Rfl:  .  naproxen sodium (ANAPROX) 220 MG tablet, Take 440 mg by mouth as needed., Disp: , Rfl:  .  OVER THE COUNTER MEDICATION, Take by mouth 1 day or 1 dose. ESTER C, Disp: , Rfl:  .  tamsulosin (FLOMAX) 0.4 MG CAPS capsule, TAKE ONE CAPSULE BY MOUTH ONCE DAILY, Disp: 90 capsule, Rfl: 3 .  UNABLE TO FIND, Med Name: Neuro Advance (with B12), Disp: , Rfl:   Review of Systems  Constitutional: Positive for fatigue and unexpected weight change (gain). Negative for activity change, appetite change, chills, diaphoresis and fever.  Respiratory: Positive for shortness of breath (on  exertion).   Cardiovascular: Negative for chest pain, palpitations and leg swelling.  Musculoskeletal: Positive for myalgias.  Neurological: Negative for tingling and numbness.    Social History  Substance Use Topics  . Smoking status: Former Smoker    Years: 10.00    Quit date: 07/08/1962  . Smokeless tobacco: Never Used     Comment: quit 1964  . Alcohol use No   Objective:   BP (!) 108/54 (BP Location: Right Arm, Patient Position: Sitting, Cuff Size: Normal)   Pulse 64   Temp (!) 97.5 F (36.4 C) (Oral)   Resp 16   Wt 186 lb (84.4 kg)   SpO2 96%   BMI 29.13 kg/m  Vitals:   01/30/17 1041  BP: (!) 108/54  Pulse: 64  Resp: 16  Temp: (!) 97.5 F (36.4 C)  TempSrc: Oral  SpO2: 96%  Weight: 186 lb (84.4 kg)   Wt Readings from Last 3 Encounters:  01/30/17 186 lb (84.4 kg)  12/30/16 187 lb 9.6 oz (85.1 kg)  12/23/16 185 lb 6.4 oz (84.1 kg)   Physical Exam  Constitutional: He appears well-developed and well-nourished.  HENT:  Head: Normocephalic.  Cardiovascular: Normal rate and regular rhythm.   Diminished pulses in the right foot.  Pulmonary/Chest: Effort normal and breath sounds normal.  Abdominal: Soft.  Musculoskeletal: He exhibits tenderness.  Left foot large callus over the 2nd and 3rd metatarsal head - plantar surface. Tender and has hammer toes both 2nd toes.      Assessment & Plan:     1. Muscle cramps Intermittent cramps in calves and thighs the past 2 days. No known injury. Gets pains into the buttocks when he bends over to pet the cat. Walks well with a cane. Will check labs for signs of electrolyte imbalance, low magnesium and anemia. Recommend he increase fluid intake and recheck pending lab reports. - Comprehensive metabolic panel - CBC with Differential/Platelet - Magnesium  2. Malaise and fatigue Fatigue and general malaise that is difficult for him to describe. Check labs for anemia, infection or metabolic disorder. - Comprehensive metabolic  panel - CBC with Differential/Platelet - Magnesium  3. Left foot pain Large callus on the ball of the foot with a couple hammer toes. Recommend he recheck with his podiatrist. Also, pending lab reports, may need to consider x-ray of L-S spine and right hip to fully evaluate.       Vernie Murders, PA  [facility]

## 2017-01-31 LAB — CBC WITH DIFFERENTIAL/PLATELET
BASOS ABS: 0 10*3/uL (ref 0.0–0.2)
Basos: 1 %
EOS (ABSOLUTE): 0.1 10*3/uL (ref 0.0–0.4)
EOS: 3 %
HEMOGLOBIN: 14 g/dL (ref 13.0–17.7)
Hematocrit: 40.7 % (ref 37.5–51.0)
IMMATURE GRANS (ABS): 0.1 10*3/uL (ref 0.0–0.1)
IMMATURE GRANULOCYTES: 1 %
LYMPHS: 28 %
Lymphocytes Absolute: 1.2 10*3/uL (ref 0.7–3.1)
MCH: 30.2 pg (ref 26.6–33.0)
MCHC: 34.4 g/dL (ref 31.5–35.7)
MCV: 88 fL (ref 79–97)
MONOCYTES: 11 %
Monocytes Absolute: 0.5 10*3/uL (ref 0.1–0.9)
NEUTROS PCT: 56 %
Neutrophils Absolute: 2.6 10*3/uL (ref 1.4–7.0)
Platelets: 240 10*3/uL (ref 150–379)
RBC: 4.64 x10E6/uL (ref 4.14–5.80)
RDW: 14.5 % (ref 12.3–15.4)
WBC: 4.4 10*3/uL (ref 3.4–10.8)

## 2017-01-31 LAB — MAGNESIUM: MAGNESIUM: 2.2 mg/dL (ref 1.6–2.3)

## 2017-01-31 LAB — COMPREHENSIVE METABOLIC PANEL
ALK PHOS: 69 IU/L (ref 39–117)
ALT: 21 IU/L (ref 0–44)
AST: 30 IU/L (ref 0–40)
Albumin/Globulin Ratio: 1.8 (ref 1.2–2.2)
Albumin: 4.4 g/dL (ref 3.5–4.7)
BILIRUBIN TOTAL: 0.6 mg/dL (ref 0.0–1.2)
BUN/Creatinine Ratio: 20 (ref 10–24)
BUN: 20 mg/dL (ref 8–27)
CALCIUM: 9.8 mg/dL (ref 8.6–10.2)
CHLORIDE: 99 mmol/L (ref 96–106)
CO2: 27 mmol/L (ref 20–29)
CREATININE: 1.01 mg/dL (ref 0.76–1.27)
GFR calc Af Amer: 76 mL/min/{1.73_m2} (ref >59)
GFR calc non Af Amer: 66 mL/min/{1.73_m2} (ref >59)
GLUCOSE: 111 mg/dL — AB (ref 65–99)
Globulin, Total: 2.4 g/dL (ref 1.5–4.5)
Potassium: 4.9 mmol/L (ref 3.5–5.2)
Sodium: 138 mmol/L (ref 134–144)
TOTAL PROTEIN: 6.8 g/dL (ref 6.0–8.5)

## 2017-02-03 NOTE — Progress Notes (Signed)
10:26 PM   [name] Jul 20, 1924 [id number]  Referring provider: Mar Daring, PA-C [place], [place] [address]  Chief Complaint  Patient presents with  . PTNS    HPI: Patient is an 83 year old Caucasian male who presents today for PTNS treatment for his urinary frequency. This will be a maintenance treatment.   Background history [name] has a long-standing history of urinary frequency and dribbling. He cannot recall a specific event when his urinary symptoms started. He states he has a constant dribbling of a small amount of urine. This occurs all day. He has not experienced an increase in the dribbling with coughing, sneezing, laughing, change in position or bending and lifting.   Looking back at his initial PTNS treatment, he was having day time voids every hour, three nightly voids and always dribbling.  So, he has made significant progress.   After his last treatment, he is making 7 (improved) trips to the bathroom during the daytime. He states he gets up 1 (stable) times nightly. He is not experiencing burning or pain with urination. He has a strong urgency to urinate. He is having 0 (stable) episodes of incontinence daily.  He feels the treatments are beneficial and would like to continue the treatment.    He does not consume any caffeine beverages, he has 1-2 glasses of wine daily and he drinks eight 8 ounce glasses of water daily.    Previous Therapy: He has been tried on tamsulosin and Toviaz without improvement in his urinary symptoms.    He does not have any contraindications present for PTNS, such as: Pacemaker, Implantable defibrillator, History of abnormal bleeding or History of neuropathies or nerve damage.Contraindications present for PTNS   Discussed with patient possible complications of procedure, such as discomfort, bleeding at insertion/stimulation site, procedure consent signed  Patient  goals: The patient's goals were to reduce his urgency and his frequency.   ROS UROLOGY Frequent Urination?: No Hard to postpone urination?: No Burning/pain with urination?: No Get up at night to urinate?: Yes Leakage of urine?: No Urine stream starts and stops?: No Trouble starting stream?: No Do you have to strain to urinate?: No Blood in urine?: No Urinary tract infection?: No Sexually transmitted disease?: No Injury to kidneys or bladder?: No Painful intercourse?: No Weak stream?: No Erection problems?: No Penile pain?: No Gastrointestinal Nausea?: No Vomiting?: No Indigestion/heartburn?: No Diarrhea?: No Constipation?: No Constitutional Fever: No Night sweats?: No Weight loss?: No Fatigue?: No Skin Skin rash/lesions?: No Itching?: No Eyes Blurred vision?: No Double vision?: No Ears/Nose/Throat Sore throat?: No Sinus problems?: No Hematologic/Lymphatic Swollen glands?: No Easy bruising?: No Cardiovascular Leg swelling?: No Chest pain?: No Respiratory Cough?: No Shortness of breath?: No Endocrine Excessive thirst?: No Musculoskeletal Back pain?: No Joint pain?: No Neurological Headaches?: No Dizziness?: No Psychologic Depression?: No Anxiety?: No   Physical Exam Blood pressure (!) 154/77, pulse 68, height 5\' 9"  (1.753 m), weight 185 lb (83.9 kg). Constitutional: Well nourished. Alert and oriented, No acute distress. HEENT: [place] AT, moist mucus membranes. Trachea midline, no masses. Cardiovascular: No clubbing, cyanosis, or edema. Respiratory: Normal respiratory effort, no increased work of breathing. Skin: No rashes, bruises or suspicious lesions. Lymph: No cervical or inguinal adenopathy. Neurologic: Grossly intact, no focal deficits, moving all 4 extremities. Psychiatric: Normal mood and affect.    1. Urinary incontinence  PTNS treatment:   The needle electrode was inserted into the lower, inner aspect of the patient's left leg. The  surface electrode was placed on the inside arch of the foot on the treatment leg. The lead set was connected to the stimulator, and the needle electrode clip was connected to the needle electrode. The stimulator that produces an adjustable electrical pulse that travels to sacral nerve plexus via the tibial nerve was increased to 19 and a toe flex was observed.   Treatment Plan:  Patient tolerated the PTNS treatment well for 30 minutes. The electrode was removed with out difficulty. He will return in one monthfor his maintenance PTNS treatment.

## 2017-02-04 ENCOUNTER — Encounter: Payer: Self-pay | Admitting: Urology

## 2017-02-04 ENCOUNTER — Ambulatory Visit (INDEPENDENT_AMBULATORY_CARE_PROVIDER_SITE_OTHER): Payer: Medicare Other | Admitting: Urology

## 2017-02-04 VITALS — BP 154/77 | HR 68 | Ht 69.0 in | Wt 185.0 lb

## 2017-02-04 DIAGNOSIS — N3941 Urge incontinence: Secondary | ICD-10-CM

## 2017-02-04 NOTE — Progress Notes (Signed)
PTNS  Session # Maintenance   Health & Social Factors: 0 Caffeine: 0 Alcohol: 0 Daytime voids #per day: 7 Night-time voids #per night: 1-2 Urgency: strong Incontinence Episodes #per day: 0 Ankle used: left Treatment Setting: 19 Feeling/ Response: toe flex   Preformed By: Parks Ranger, PA-S  Assistant: Zara Council, PA-C

## 2017-02-06 ENCOUNTER — Ambulatory Visit
Admission: RE | Admit: 2017-02-06 | Discharge: 2017-02-06 | Disposition: A | Discharge status: Home / Self Care | Payer: Medicare Other | Source: Ambulatory Visit | Attending: Family Medicine | Admitting: Family Medicine

## 2017-02-06 ENCOUNTER — Telehealth: Payer: Self-pay | Admitting: Family Medicine

## 2017-02-06 DIAGNOSIS — M25551 Pain in right hip: Secondary | ICD-10-CM | POA: Insufficient documentation

## 2017-02-06 DIAGNOSIS — I700 Atherosclerosis of aorta: Secondary | ICD-10-CM | POA: Diagnosis not present

## 2017-02-06 DIAGNOSIS — M5136 Other intervertebral disc degeneration, lumbar region: Secondary | ICD-10-CM | POA: Insufficient documentation

## 2017-02-06 DIAGNOSIS — M545 Low back pain: Secondary | ICD-10-CM

## 2017-02-06 DIAGNOSIS — M25552 Pain in left hip: Secondary | ICD-10-CM | POA: Insufficient documentation

## 2017-02-06 DIAGNOSIS — M47816 Spondylosis without myelopathy or radiculopathy, lumbar region: Secondary | ICD-10-CM | POA: Insufficient documentation

## 2017-02-06 NOTE — Telephone Encounter (Signed)
Order bilateral hip x-rays with L-S spine x-rays to evaluate bilateral hip and back pain.

## 2017-02-06 NOTE — Telephone Encounter (Signed)
Sorry routed message to PCP instead of [place]. Re-routed message.

## 2017-02-06 NOTE — Telephone Encounter (Signed)
Patient advised. He states both hips are painful but states that the left leg/hip is more painful than right. He is requesting to proceed with x-rays today if possible and wanting to know if he can get bilateral hip x-rays since pain is present in both hips. Please advise.

## 2017-02-06 NOTE — Telephone Encounter (Signed)
X-rays ordered. Patient advised he can go to [facility] outpatient imaging for x-rays today.

## 2017-02-06 NOTE — Telephone Encounter (Signed)
-----   Message from Margo Common, [place] sent at 01/31/2017  8:27 AM EDT ----- All tests essentially normal. No drop in calcium, potassium, magnesium, hemoglobin, kidney or liver functions and no anemia. Recommend recheck with podiatrist for foot pain. Consider x-ray of L-S spine and right hip if pain in leg is persisting.

## 2017-02-06 NOTE — Telephone Encounter (Signed)
Pt returned call to get lab results. Please advise. Thanks TNP °

## 2017-02-10 ENCOUNTER — Telehealth: Payer: Self-pay

## 2017-02-10 NOTE — Telephone Encounter (Signed)
Advised Mrs. Tiley. She would like to FU with Dr. Jacinto Reap. Appointment made.

## 2017-02-10 NOTE — Telephone Encounter (Signed)
-----   Message from Margo Common, [place] sent at 02/07/2017  7:06 PM EDT ----- Normal right and left hip x-rays. No bony abnormality reported. Back x-ray showed some mild to moderate degenerative changes in the lumbar spine. May try prednisone 5 mg 6 day (Sterapred) taper if discomfort persistent. Recheck as needed.

## 2017-02-12 ENCOUNTER — Encounter: Payer: Self-pay | Admitting: Family Medicine

## 2017-02-12 ENCOUNTER — Ambulatory Visit (INDEPENDENT_AMBULATORY_CARE_PROVIDER_SITE_OTHER): Payer: Medicare Other | Admitting: Family Medicine

## 2017-02-12 VITALS — BP 116/60 | HR 68 | Temp 97.5°F | Resp 16 | Wt 183.0 lb

## 2017-02-12 DIAGNOSIS — M7502 Adhesive capsulitis of left shoulder: Secondary | ICD-10-CM

## 2017-02-12 DIAGNOSIS — L309 Dermatitis, unspecified: Secondary | ICD-10-CM | POA: Insufficient documentation

## 2017-02-12 DIAGNOSIS — M79605 Pain in left leg: Secondary | ICD-10-CM | POA: Insufficient documentation

## 2017-02-12 DIAGNOSIS — M7500 Adhesive capsulitis of unspecified shoulder: Secondary | ICD-10-CM | POA: Insufficient documentation

## 2017-02-12 MED ORDER — TRIAMCINOLONE ACETONIDE 0.5 % EX OINT: 1.0000 "application " | TOPICAL_OINTMENT | Freq: Two times a day (BID) | CUTANEOUS | 1 refills | Status: DC

## 2017-02-12 MED ORDER — LIDOCAINE-EPINEPHRINE 2 %-1:100000 IJ SOLN: 3.0000 mL | Freq: Once | INTRAMUSCULAR | Status: DC

## 2017-02-12 MED ORDER — METHYLPREDNISOLONE ACETATE 40 MG/ML IJ SUSP: 40.0000 mg | Freq: Once | INTRAMUSCULAR | Status: DC

## 2017-02-12 NOTE — Assessment & Plan Note (Signed)
Exam c/w frozen shoulder likely 2/2 rotator cuff strain Strength intact so lower likelihood of rotator cuff tear that would be managed conservatively anyways Subacromial steroid injection today PT referral F/u prn

## 2017-02-12 NOTE — Patient Instructions (Signed)

## 2017-02-12 NOTE — Assessment & Plan Note (Signed)
Likely related to lumbar DJD and sciatica No abnormalities on exam and strength intact Discussed HEP, rest, antiinflammatories No red flags Return precautions discussed

## 2017-02-12 NOTE — Progress Notes (Signed)
Patient: [name] Male    DOB: [birth year]   83 y.o.   MRN: [id number] Visit Date: 02/12/2017  Today's Provider: Lavon Paganini, MD   Chief Complaint  Patient presents with  . Establish Care  . Leg Pain  . Rash   Subjective:      Establish Care [name] presents to office to establish care with his new primary care provider. He was previously treated at [facility] by Dr. Venia Minks and Fenton Malling, PA-C. He would like to discuss leg pain, as well as left shoulder pain that causes DROM.   Leg Pain   Injury mechanism: He was seen by Simona Huh on 01/30/2017 for this problem. Labs and xrays were checked, which were normal other than degenerative changes of his lumbar spine. Prednisone taper was advised, but Mrs. Brizzi preferred to speak with new PCP before starting tx. The pain is present in the left leg (pt states he had a CVA 6 years ago, which caused right leg weakness. He has compensated for the leg weakness, which he believes is caused the left leg pain). The quality of the pain is described as aching. The pain is at a severity of 7/10. The pain is severe. Associated symptoms include muscle weakness (pt had CVA 6 years ago). Pertinent negatives include no inability to bear weight, loss of motion, loss of sensation, numbness or tingling. Exacerbated by: sitting and standing too long. He has tried acetaminophen for the symptoms. The treatment provided moderate relief.  pain radiates from hip to foot.  Rash Pt is c/o rash located on his abdomen, chest, arms and back. The rash is red and raised above the skin. He is c/o pruritis. Pt saw Simona Huh for this rash on 12/30/2016. He was diagnosed with urticarial rash and treated with Prednisone, with relief and then recurrence.  Has tried aquafor, cetaphil, and OTC hydrocortisone with no relief.  L shoulder pain Started ~15 months ago after exercising Pain has improved More concerned that ROM in internal  rotation, flexion, and abduction is decreased Doing some HEP that seems to help some Also getting laser therapy and magnets from a chiropractor  No numbness, weakness, redness, swelling   Allergies  Allergen Reactions  . Toviaz [Fesoterodine Fumarate Er] Other (See Comments)    Acid reflux     Current Outpatient Prescriptions:  .  acetaminophen (TYLENOL) 325 MG tablet, Take 650 mg by mouth every 6 (six) hours as needed., Disp: , Rfl:  .  Aloe Vera 25 MG CAPS, Take 100 mg by mouth daily. , Disp: , Rfl:  .  amLODipine (NORVASC) 2.5 MG tablet, TAKE ONE TABLET BY MOUTH ONCE DAILY, Disp: 90 tablet, Rfl: 1 .  aspirin 81 MG tablet, Take 81 mg by mouth daily., Disp: , Rfl:  .  docusate sodium (COLACE) 100 MG capsule, Take 1 capsule (100 mg total) by mouth 2 (two) times daily as needed for mild constipation or moderate constipation., Disp: 30 capsule, Rfl: 0 .  escitalopram (LEXAPRO) 5 MG tablet, Take 1 tablet (5 mg total) by mouth at bedtime., Disp: 90 tablet, Rfl: 1 .  glucosamine-chondroitin 500-400 MG tablet, Take 1 tablet by mouth 2 (two) times daily. , Disp: , Rfl:  .  hydrochlorothiazide (MICROZIDE) 12.5 MG capsule, TAKE ONE CAPSULE BY MOUTH ONCE DAILY, Disp: 90 capsule, Rfl: 1 .  losartan (COZAAR) 25 MG tablet, TAKE ONE TABLET BY MOUTH ONCE DAILY, Disp: 90 tablet, Rfl: 1 .  Multiple Vitamin (MULTIVITAMIN)  capsule, Take 1 capsule by mouth daily., Disp: , Rfl:  .  naproxen sodium (ANAPROX) 220 MG tablet, Take 440 mg by mouth as needed., Disp: , Rfl:  .  OVER THE COUNTER MEDICATION, Take by mouth 1 day or 1 dose. ESTER C, Disp: , Rfl:  .  tamsulosin (FLOMAX) 0.4 MG CAPS capsule, TAKE ONE CAPSULE BY MOUTH ONCE DAILY, Disp: 90 capsule, Rfl: 3 .  zinc sulfate 220 (50 Zn) MG capsule, Take 220 mg by mouth daily., Disp: , Rfl:   Review of Systems  Constitutional: Positive for activity change (due to leg/hip pain) and fatigue. Negative for appetite change, chills, diaphoresis, fever and unexpected  weight change.  Musculoskeletal: Positive for arthralgias.  Skin: Positive for rash.  Neurological: Negative for tingling and numbness.    Social History  Substance Use Topics  . Smoking status: Former Smoker    Years: 10.00    Quit date: 07/08/1962  . Smokeless tobacco: Never Used     Comment: quit 1964  . Alcohol use No   Objective:   BP 116/60 (BP Location: Left Arm, Patient Position: Sitting, Cuff Size: Normal)   Pulse 68   Temp (!) 97.5 F (36.4 C) (Oral)   Resp 16   Wt 183 lb (83 kg)   BMI 27.02 kg/m  Vitals:   02/12/17 1018  BP: 116/60  Pulse: 68  Resp: 16  Temp: (!) 97.5 F (36.4 C)  TempSrc: Oral  Weight: 183 lb (83 kg)     Physical Exam  Constitutional: He is oriented to person, place, and time. He appears well-developed and well-nourished. No distress.  HENT:  Head: Normocephalic and atraumatic.  Eyes: Conjunctivae are normal.  Cardiovascular: Normal rate and regular rhythm.   Pulmonary/Chest: Effort normal.  Musculoskeletal: He exhibits no edema or deformity.  L Shoulder: Inspection reveals no abnormalities, atrophy or asymmetry. Palpation is normal with no tenderness over AC joint or bicipital groove. ROM is decreased in flexion, abduction, and internal rotation. Rotator cuff strength normal throughout. No signs of impingement with negative Neer and Hawkin's tests, empty can. Speeds and Yergason's tests normal.  Spine: no TTP over spinous processes. Limited extension. Negative SLR b/l. No leg TTP.   Neurological: He is alert and oriented to person, place, and time.  Skin: Skin is warm and dry.  Pruritic, erythematous raised maculopapular rash of b/l forearms, elbows, and chest.  Psychiatric: He has a normal mood and affect. His behavior is normal.        Assessment & Plan:           Frozen shoulder Exam c/w frozen shoulder likely 2/2 rotator cuff strain Strength intact so lower likelihood of rotator cuff tear that would be managed  conservatively anyways Subacromial steroid injection today PT referral F/u prn  Eczema Rash c/w eczema, no hives Triamcinolone BID Reviewed keeping skin well moisturized F/u prn  Left leg pain Likely related to lumbar DJD and sciatica No abnormalities on exam and strength intact Discussed HEP, rest, antiinflammatories No red flags Return precautions discussed   Procedure note: INJECTION: Patient was given informed consent. Appropriate time out was taken. Area prepped and draped in usual sterile fashion. 1 cc of methylprednisolone 40 mg/ml plus  3 cc of 1% lidocaine without epinephrine was injected into the L shoulder using a(n) subacromial posterior approach. The patient tolerated the procedure well. There were no complications. Post procedure instructions were given.    Lavon Paganini, MD  [facility]

## 2017-02-12 NOTE — Assessment & Plan Note (Signed)
Rash c/w eczema, no hives Triamcinolone BID Reviewed keeping skin well moisturized F/u prn

## 2017-02-25 DIAGNOSIS — M7502 Adhesive capsulitis of left shoulder: Secondary | ICD-10-CM | POA: Diagnosis not present

## 2017-02-26 ENCOUNTER — Other Ambulatory Visit: Payer: Self-pay | Admitting: Family Medicine

## 2017-02-26 DIAGNOSIS — I10 Essential (primary) hypertension: Secondary | ICD-10-CM

## 2017-02-26 NOTE — Telephone Encounter (Signed)
See refill request.

## 2017-02-28 DIAGNOSIS — M7502 Adhesive capsulitis of left shoulder: Secondary | ICD-10-CM | POA: Diagnosis not present

## 2017-03-03 DIAGNOSIS — M7502 Adhesive capsulitis of left shoulder: Secondary | ICD-10-CM | POA: Diagnosis not present

## 2017-03-05 DIAGNOSIS — M7502 Adhesive capsulitis of left shoulder: Secondary | ICD-10-CM | POA: Diagnosis not present

## 2017-03-07 DIAGNOSIS — M7502 Adhesive capsulitis of left shoulder: Secondary | ICD-10-CM | POA: Diagnosis not present

## 2017-03-10 DIAGNOSIS — M7502 Adhesive capsulitis of left shoulder: Secondary | ICD-10-CM | POA: Diagnosis not present

## 2017-03-12 DIAGNOSIS — M7502 Adhesive capsulitis of left shoulder: Secondary | ICD-10-CM | POA: Diagnosis not present

## 2017-03-12 NOTE — Progress Notes (Signed)
3:40 PM   [name] [birth year] [id number]  Referring provider: Mar Daring, PA-C [address] [place], [place] [address]  Chief Complaint  Patient presents with  . PTNS    HPI: Patient is an 83 year old Caucasian male who presents today for PTNS treatment for his urinary frequency. This will be a maintenance treatment.   Background history [name] has a long-standing history of urinary frequency and dribbling. He cannot recall a specific event when his urinary symptoms started. He states he has a constant dribbling of a small amount of urine. This occurs all day. He has not experienced an increase in the dribbling with coughing, sneezing, laughing, change in position or bending and lifting.   Looking back at his initial PTNS treatment, he was having day time voids every hour, three nightly voids and always dribbling.  So, he has made significant progress.   After his last treatment, he is making 7 (stable) trips to the bathroom during the daytime. He states he gets up 2 (little worse) times nightly. He is not experiencing burning or pain with urination. He has a strong (stable) urgency to urinate. He is having 0 (stable) episodes of incontinence daily.  He feels the treatments are beneficial and would like to continue the treatment.    He does not consume any caffeine beverages, he has 1-2 glasses of wine daily and he drinks eight 8 ounce glasses of water daily.    Previous Therapy: He has been tried on tamsulosin and Toviaz without improvement in his urinary symptoms.    He does not have any contraindications present for PTNS, such as: Pacemaker, Implantable defibrillator, History of abnormal bleeding or History of neuropathies or nerve damage.Contraindications present for PTNS   Discussed with patient possible complications of procedure, such as discomfort, bleeding at insertion/stimulation site, procedure consent  signed  Patient goals: The patient's goals were to reduce his urgency and his frequency.   ROS UROLOGY Frequent Urination?: No Hard to postpone urination?: Yes Burning/pain with urination?: No Get up at night to urinate?: Yes Leakage of urine?: Yes Urine stream starts and stops?: No Trouble starting stream?: No Do you have to strain to urinate?: No Blood in urine?: No Urinary tract infection?: No Sexually transmitted disease?: No Injury to kidneys or bladder?: No Painful intercourse?: No Weak stream?: No Erection problems?: No Penile pain?: No Gastrointestinal Nausea?: No Vomiting?: No Indigestion/heartburn?: No Diarrhea?: No Constipation?: Yes Constitutional Fever: No Night sweats?: No Weight loss?: No Fatigue?: No Skin Skin rash/lesions?: No Itching?: No Eyes Blurred vision?: No Double vision?: No Ears/Nose/Throat Sore throat?: No Sinus problems?: No Hematologic/Lymphatic Swollen glands?: No Easy bruising?: No Cardiovascular Leg swelling?: No Chest pain?: No Respiratory Cough?: No Shortness of breath?: No Endocrine Excessive thirst?: No Musculoskeletal Back pain?: No Joint pain?: No Neurological Headaches?: No Dizziness?: No Psychologic Depression?: No Anxiety?: No   Physical Exam Blood pressure 134/70, pulse 73, height 5\' 9"  (1.753 m), weight 182 lb 4.8 oz (82.7 kg). Constitutional: Well nourished. Alert and oriented, No acute distress. HEENT: [place] AT, moist mucus membranes. Trachea midline, no masses. Cardiovascular: No clubbing, cyanosis, or edema. Respiratory: Normal respiratory effort, no increased work of breathing. Skin: No rashes, bruises or suspicious lesions. Lymph: No cervical or inguinal adenopathy. Neurologic: Grossly intact, no focal deficits, moving all 4 extremities. Psychiatric: Normal mood and affect.    1. Urinary incontinence  PTNS treatment:   The needle electrode was inserted into the lower, inner aspect of the  patient's left leg. The surface electrode was placed on the inside arch of the foot on the treatment leg. The lead set was connected to the stimulator, and the needle electrode clip was connected to the needle electrode. The stimulator that produces an adjustable electrical pulse that travels to sacral nerve plexus via the tibial nerve was increased to 19 until a sensory response was received.    Treatment Plan:  Patient tolerated the PTNS treatment well for 30 minutes. The electrode was removed with out difficulty. He will return in one monthfor his maintenance PTNS treatment.

## 2017-03-13 ENCOUNTER — Ambulatory Visit (INDEPENDENT_AMBULATORY_CARE_PROVIDER_SITE_OTHER): Payer: Medicare Other | Admitting: Urology

## 2017-03-13 ENCOUNTER — Encounter: Payer: Self-pay | Admitting: Urology

## 2017-03-13 VITALS — BP 134/70 | HR 73 | Ht 69.0 in | Wt 182.3 lb

## 2017-03-13 DIAGNOSIS — N3941 Urge incontinence: Secondary | ICD-10-CM

## 2017-03-13 NOTE — Progress Notes (Signed)
PTNS  Session # Maint 20  Health & Social Factors: No Change Caffeine: 0 Alcohol: 0 Daytime voids #per day: 7 Night-time voids #per night: 2 Urgency: Strong Incontinence Episodes #per day: 0 Ankle used: Left Treatment Setting: 19 Feeling/ Response: Sensory  Preformed By: Zara Council PA-C   Follow Up: One Month

## 2017-03-14 DIAGNOSIS — M7502 Adhesive capsulitis of left shoulder: Secondary | ICD-10-CM | POA: Diagnosis not present

## 2017-03-17 DIAGNOSIS — M7502 Adhesive capsulitis of left shoulder: Secondary | ICD-10-CM | POA: Diagnosis not present

## 2017-03-19 DIAGNOSIS — M7502 Adhesive capsulitis of left shoulder: Secondary | ICD-10-CM | POA: Diagnosis not present

## 2017-03-20 ENCOUNTER — Ambulatory Visit: Payer: Medicare Other | Admitting: Physician Assistant

## 2017-03-21 DIAGNOSIS — M7502 Adhesive capsulitis of left shoulder: Secondary | ICD-10-CM | POA: Diagnosis not present

## 2017-03-24 DIAGNOSIS — M7502 Adhesive capsulitis of left shoulder: Secondary | ICD-10-CM | POA: Diagnosis not present

## 2017-03-26 DIAGNOSIS — M7502 Adhesive capsulitis of left shoulder: Secondary | ICD-10-CM | POA: Diagnosis not present

## 2017-03-27 DIAGNOSIS — M7502 Adhesive capsulitis of left shoulder: Secondary | ICD-10-CM | POA: Diagnosis not present

## 2017-03-31 ENCOUNTER — Encounter: Payer: Self-pay | Admitting: Family Medicine

## 2017-03-31 ENCOUNTER — Ambulatory Visit (INDEPENDENT_AMBULATORY_CARE_PROVIDER_SITE_OTHER): Payer: Medicare Other | Admitting: Family Medicine

## 2017-03-31 VITALS — BP 124/62 | HR 64 | Temp 97.5°F | Resp 20 | Wt 183.0 lb

## 2017-03-31 DIAGNOSIS — R7303 Prediabetes: Secondary | ICD-10-CM

## 2017-03-31 DIAGNOSIS — R296 Repeated falls: Secondary | ICD-10-CM

## 2017-03-31 DIAGNOSIS — R7309 Other abnormal glucose: Secondary | ICD-10-CM | POA: Diagnosis not present

## 2017-03-31 DIAGNOSIS — M7502 Adhesive capsulitis of left shoulder: Secondary | ICD-10-CM | POA: Diagnosis not present

## 2017-03-31 DIAGNOSIS — K5900 Constipation, unspecified: Secondary | ICD-10-CM

## 2017-03-31 DIAGNOSIS — I10 Essential (primary) hypertension: Secondary | ICD-10-CM | POA: Diagnosis not present

## 2017-03-31 DIAGNOSIS — E7800 Pure hypercholesterolemia, unspecified: Secondary | ICD-10-CM

## 2017-03-31 DIAGNOSIS — F3342 Major depressive disorder, recurrent, in full remission: Secondary | ICD-10-CM

## 2017-03-31 LAB — POCT GLYCOSYLATED HEMOGLOBIN (HGB A1C): HEMOGLOBIN A1C: 5.9

## 2017-03-31 MED ORDER — POLYETHYLENE GLYCOL 3350 17 GM/SCOOP PO POWD: 17.0000 g | Freq: Every day | ORAL | 11 refills | Status: DC

## 2017-03-31 MED ORDER — ESCITALOPRAM OXALATE 10 MG PO TABS: 10.0000 mg | ORAL_TABLET | Freq: Every day | ORAL | 3 refills | Status: DC

## 2017-03-31 NOTE — Progress Notes (Signed)
Patient: [name] Male    DOB: 04-30-1925   83 y.o.   MRN: [id number] Visit Date: 03/31/2017  Today's Provider: Lavon Paganini, MD   Chief Complaint  Patient presents with  . Hypertension  . Hyperlipidemia  . Hyperglycemia   Subjective:    HPI      Hypertension, follow-up:  BP Readings from Last 3 Encounters:  03/31/17 124/62  03/13/17 134/70  02/12/17 116/60    He was last seen for hypertension 6 months ago.  BP at that visit was 122/62. Management since that visit includes none. Labs were supposed to be checked, but were not done. He reports good compliance with treatment. He is not having side effects.  He is exercising. Walking  3/4 mile about 3-4 times per week. He was walking a mile daily, but can no longer do this due to leg pain. He is adherent to low salt diet.   He is experiencing fatigue and lower extremity edema.  Patient denies chest pain, chest pressure/discomfort, claudication, dyspnea, exertional chest pressure/discomfort, irregular heart beat, near-syncope, orthopnea, palpitations and syncope.   Cardiovascular risk factors include advanced age (older than 67 for men, 58 for women), dyslipidemia, hypertension and male gender.    Weight trend: stable Wt Readings from Last 3 Encounters:  03/31/17 183 lb (83 kg)  03/13/17 182 lb 4.8 oz (82.7 kg)  02/12/17 183 lb (83 kg)    Current diet: in general, a "healthy" diet    ------------------------------------------------------------------------  Lipid/Cholesterol, Follow-up:   Last seen for this 6 months ago.  Management changes since that visit include none. . Last Lipid Panel:    Component Value Date/Time   CHOL 147 09/15/2015 1043   TRIG 72 09/15/2015 1043   HDL 59 09/15/2015 1043   LDLCALC 74 09/15/2015 1043    Risk factors for vascular disease include cerebral vascular disease, hypercholesterolemia and hypertension  He reports excellent compliance with treatment.  No  longer taking a statin. He is not having side effects.   -------------------------------------------------------------------  Hyperglycemia, Follow-up:   Lab Results  Component Value Date   HGBA1C 5.9 05/09/2016   HGBA1C 6.0 (H) 09/15/2015   HGBA1C 5.6 03/15/2015   GLUCOSE 111 (H) 01/30/2017   GLUCOSE 113 (H) 09/15/2015   GLUCOSE 123 (H) 01/06/2015    Last seen for for this 6 months ago.  Management since then includes none. Current symptoms include polydipsia and have been stable.  Constipation - sometimes goes 2-3 days without BM - stools seem large and hard - has pain when sitting when constipated - taking prunes which seem to help have BM daily - tried increasing fiber which helped some - never tried Miralax  Depression: - seems to lack motivation, enthusiasm, and energy - has had a lot of friends die recently - lexapro seemed to work well previously  Falls: - patient and wife report multiple falls in the last year - most recently earlier today - walks with a cane - PT has helped well with shoulder pain and ROM - he is wondering if it could help with balance too - no injury after fall today - did not hit his head - gets off balance and then falls backwards   Allergies  Allergen Reactions  . Toviaz [Fesoterodine Fumarate Er] Other (See Comments)    Acid reflux     Current Outpatient Prescriptions:  .  acetaminophen (TYLENOL) 325 MG tablet, Take 650 mg by mouth every 6 (six) hours as needed.,  Disp: , Rfl:  .  Aloe Vera 25 MG CAPS, Take 100 mg by mouth daily. , Disp: , Rfl:  .  amLODipine (NORVASC) 2.5 MG tablet, TAKE ONE TABLET BY MOUTH ONCE DAILY, Disp: 90 tablet, Rfl: 1 .  aspirin 81 MG tablet, Take 81 mg by mouth daily., Disp: , Rfl:  .  docusate sodium (COLACE) 100 MG capsule, Take 1 capsule (100 mg total) by mouth 2 (two) times daily as needed for mild constipation or moderate constipation., Disp: 30 capsule, Rfl: 0 .  escitalopram (LEXAPRO) 5 MG tablet,  Take 1 tablet (5 mg total) by mouth at bedtime., Disp: 90 tablet, Rfl: 1 .  glucosamine-chondroitin 500-400 MG tablet, Take 1 tablet by mouth 2 (two) times daily. , Disp: , Rfl:  .  hydrochlorothiazide (MICROZIDE) 12.5 MG capsule, TAKE ONE CAPSULE BY MOUTH ONCE DAILY, Disp: 90 capsule, Rfl: 1 .  losartan (COZAAR) 25 MG tablet, TAKE ONE TABLET BY MOUTH ONCE DAILY, Disp: 90 tablet, Rfl: 1 .  Multiple Vitamin (MULTIVITAMIN) capsule, Take 1 capsule by mouth daily., Disp: , Rfl:  .  naproxen sodium (ANAPROX) 220 MG tablet, Take 440 mg by mouth as needed., Disp: , Rfl:  .  OVER THE COUNTER MEDICATION, Take by mouth 1 day or 1 dose. ESTER C, Disp: , Rfl:  .  tamsulosin (FLOMAX) 0.4 MG CAPS capsule, TAKE ONE CAPSULE BY MOUTH ONCE DAILY, Disp: 90 capsule, Rfl: 3 .  triamcinolone ointment (KENALOG) 0.5 %, Apply 1 application topically 2 (two) times daily., Disp: 30 g, Rfl: 1 .  zinc sulfate 220 (50 Zn) MG capsule, Take 220 mg by mouth daily., Disp: , Rfl:   Current Facility-Administered Medications:  .  lidocaine-EPINEPHrine (XYLOCAINE W/EPI) 2 %-1:100000 (with pres) injection 3 mL, 3 mL, Intradermal, Once, Adarryl Goldammer, Dionne Bucy, MD .  methylPREDNISolone acetate (DEPO-MEDROL) injection 40 mg, 40 mg, Intramuscular, Once, Georgia Baria, Dionne Bucy, MD  Review of Systems  Constitutional: Positive for fatigue. Negative for activity change, appetite change, chills, diaphoresis, fever and unexpected weight change.  HENT: Positive for postnasal drip. Negative for congestion, dental problem, ear pain, sore throat and tinnitus.   Eyes: Negative.   Respiratory: Positive for cough. Negative for shortness of breath.   Cardiovascular: Positive for leg swelling. Negative for chest pain and palpitations.  Gastrointestinal: Negative.   Endocrine: Positive for polydipsia. Negative for polyphagia and polyuria.  Musculoskeletal: Positive for myalgias.  Neurological: Negative.   Psychiatric/Behavioral: Negative.     Social  History  Substance Use Topics  . Smoking status: Former Smoker    Years: 10.00    Quit date: 07/08/1962  . Smokeless tobacco: Never Used     Comment: quit 1964  . Alcohol use No   Objective:   BP 124/62 (BP Location: Right Arm, Patient Position: Sitting, Cuff Size: Normal)   Pulse 64   Temp (!) 97.5 F (36.4 C) (Oral)   Resp 20   Wt 183 lb (83 kg)   BMI 27.02 kg/m  Vitals:   03/31/17 1535  BP: 124/62  Pulse: 64  Resp: 20  Temp: (!) 97.5 F (36.4 C)  TempSrc: Oral  Weight: 183 lb (83 kg)     Physical Exam  Constitutional: He is oriented to person, place, and time. He appears well-developed and well-nourished. No distress.  HENT:  Head: Normocephalic and atraumatic.  Right Ear: External ear normal.  Left Ear: External ear normal.  Nose: Nose normal.  Mouth/Throat: Oropharynx is clear and moist. No oropharyngeal exudate.  Eyes:  Pupils are equal, round, and reactive to light. Conjunctivae are normal. No scleral icterus.  Neck: Neck supple. No thyromegaly present.  Abdominal: Soft. Bowel sounds are normal. He exhibits no distension. There is no tenderness. There is no rebound and no guarding.  Musculoskeletal: He exhibits no edema or deformity.  Lymphadenopathy:    He has no cervical adenopathy.  Neurological: He is alert and oriented to person, place, and time.  Skin: Skin is warm and dry.  Psychiatric: He has a normal mood and affect. His behavior is normal.        Assessment & Plan:      Problem List Items Addressed This Visit      Cardiovascular and Mediastinum   Benign essential HTN    Well controlled Continue low dose amlodipine and HCTZ Recent CMP wnl F/u 6 months        Digestive   Constipation    Chronic problem - no bleeding or change in stool caliber Start daily miralax and titrate to soft BM daily F/u prn        Other   Prediabetes - Primary    A1c stable at 5.9 Discussed low carb diet and exercise F/u in 6 months       Hypercholesterolemia    Not on a statin for several years Recheck lipid panel      Relevant Orders   Lipid panel   Frequent falls    Discussed safety measures with patient Will refer to PT to work on balance and safety training May benefit from multi-point cane - will let PT eval for this      Relevant Orders   Ambulatory referral to Physical Therapy   Recurrent major depressive disorder, in full remission ([place])    Not fully controlled on very low dose lexapro Increase to 10mg  daily F/u in 3 months      Relevant Medications   escitalopram (LEXAPRO) 10 MG tablet     Patient desires Shingrix vaccine - advised to discuss with insurance for coverage beforehand     The entirety of the information documented in the History of Present Illness, Review of Systems and Physical Exam were personally obtained by me. Portions of this information were initially documented by Raquel Sarna Ratchford, CMA and reviewed by me for thoroughness and accuracy.    Lavon Paganini, MD  [facility]

## 2017-03-31 NOTE — Patient Instructions (Signed)
Major Depressive Disorder, Adult Major depressive disorder (MDD) is a mental health condition. It may also be called clinical depression or unipolar depression. MDD usually causes feelings of sadness, hopelessness, or helplessness. MDD can also cause physical symptoms. It can interfere with work, school, relationships, and other everyday activities. MDD may be mild, moderate, or severe. It may occur once (single episode major depressive disorder) or it may occur multiple times (recurrent major depressive disorder). What are the causes? The exact cause of this condition is not known. MDD is most likely caused by a combination of things, which may include:  Genetic factors. These are traits that are passed along from parent to child.  Individual factors. Your personality, your behavior, and the way you handle your thoughts and feelings may contribute to MDD. This includes personality traits and behaviors learned from others.  Physical factors, such as: ? Differences in the part of your brain that controls emotion. This part of your brain may be different than it is in people who do not have MDD. ? Long-term (chronic) medical or psychiatric illnesses.  Social factors. Traumatic experiences or major life changes may play a role in the development of MDD.  What increases the risk? This condition is more likely to develop in women. The following factors may also make you more likely to develop MDD:  A family history of depression.  Troubled family relationships.  Abnormally low levels of certain brain chemicals.  Traumatic events in childhood, especially abuse or the loss of a parent.  Being under a lot of stress, or long-term stress, especially from upsetting life experiences or losses.  A history of: ? Chronic physical illness. ? Other mental health disorders. ? Substance abuse.  Poor living conditions.  Experiencing social exclusion or discrimination on a regular basis.  What are  the signs or symptoms? The main symptoms of MDD typically include:  Constant depressed or irritable mood.  Loss of interest in things and activities.  MDD symptoms may also include:  Sleeping or eating too much or too little.  Unexplained weight change.  Fatigue or low energy.  Feelings of worthlessness or guilt.  Difficulty thinking clearly or making decisions.  Thoughts of suicide or of harming others.  Physical agitation or weakness.  Isolation.  Severe cases of MDD may also occur with other symptoms, such as:  Delusions or hallucinations, in which you imagine things that are not real (psychotic depression).  Low-level depression that lasts at least a year (chronic depression or persistent depressive disorder).  Extreme sadness and hopelessness (melancholic depression).  Trouble speaking and moving (catatonic depression).  How is this diagnosed? This condition may be diagnosed based on:  Your symptoms.  Your medical history, including your mental health history. This may involve tests to evaluate your mental health. You may be asked questions about your lifestyle, including any drug and alcohol use, and how long you have had symptoms of MDD.  A physical exam.  Blood tests to rule out other conditions.  You must have a depressed mood and at least four other MDD symptoms most of the day, nearly every day in the same 2-week timeframe before your health care provider can confirm a diagnosis of MDD. How is this treated? This condition is usually treated by mental health professionals, such as psychologists, psychiatrists, and clinical social workers. You may need more than one type of treatment. Treatment may include:  Psychotherapy. This is also called talk therapy or counseling. Types of psychotherapy include: ? Cognitive behavioral   therapy (CBT). This type of therapy teaches you to recognize unhealthy feelings, thoughts, and behaviors, and replace them with  positive thoughts and actions. ? Interpersonal therapy (IPT). This helps you to improve the way you relate to and communicate with others. ? Family therapy. This treatment includes members of your family.  Medicine to treat anxiety and depression, or to help you control certain emotions and behaviors.  Lifestyle changes, such as: ? Limiting alcohol and drug use. ? Exercising regularly. ? Getting plenty of sleep. ? Making healthy eating choices. ? Spending more time outdoors.  Treatments involving stimulation of the brain can be used in situations with extremely severe symptoms, or when medicine or other therapies do not work over time. These treatments include electroconvulsive therapy, transcranial magnetic stimulation, and vagal nerve stimulation. Follow these instructions at home: Activity  Return to your normal activities as told by your health care provider.  Exercise regularly and spend time outdoors as told by your health care provider. General instructions  Take over-the-counter and prescription medicines only as told by your health care provider.  Do not drink alcohol. If you drink alcohol, limit your alcohol intake to no more than 1 drink a day for nonpregnant women and 2 drinks a day for men. One drink equals 12 oz of beer, 5 oz of wine, or 1 oz of hard liquor. Alcohol can affect any antidepressant medicines you are taking. Talk to your health care provider about your alcohol use.  Eat a healthy diet and get plenty of sleep.  Find activities that you enjoy doing, and make time to do them.  Consider joining a support group. Your health care provider may be able to recommend a support group.  Keep all follow-up visits as told by your health care provider. This is important. Where to find more information: National Alliance on Mental Illness  www.nami.org  U.S. National Institute of Mental Health  www.nimh.nih.gov  National Suicide Prevention  Lifeline  1-800-273-TALK (8255). This is free, 24-hour help.  Contact a health care provider if:  Your symptoms get worse.  You develop new symptoms. Get help right away if:  You self-harm.  You have serious thoughts about hurting yourself or others.  You see, hear, taste, smell, or feel things that are not present (hallucinate). This information is not intended to replace advice given to you by your health care provider. Make sure you discuss any questions you have with your health care provider. Document Released: 10/19/2012 Document Revised: 02/29/2016 Document Reviewed: 01/03/2016 Elsevier Interactive Patient Education  2017 [employer].  

## 2017-04-01 DIAGNOSIS — F329 Major depressive disorder, single episode, unspecified: Secondary | ICD-10-CM | POA: Insufficient documentation

## 2017-04-01 DIAGNOSIS — F32A Depression, unspecified: Secondary | ICD-10-CM | POA: Insufficient documentation

## 2017-04-01 DIAGNOSIS — R296 Repeated falls: Secondary | ICD-10-CM | POA: Insufficient documentation

## 2017-04-01 DIAGNOSIS — E7800 Pure hypercholesterolemia, unspecified: Secondary | ICD-10-CM | POA: Diagnosis not present

## 2017-04-01 LAB — LIPID PANEL
CHOL/HDL RATIO: 2.8 (calc) (ref <5.0)
Cholesterol: 173 mg/dL (ref <200)
HDL: 61 mg/dL (ref >40)
LDL CHOLESTEROL (CALC): 92 mg/dL
NON-HDL CHOLESTEROL (CALC): 112 mg/dL (ref <130)
TRIGLYCERIDES: 103 mg/dL (ref <150)

## 2017-04-01 NOTE — Assessment & Plan Note (Signed)
Not fully controlled on very low dose lexapro Increase to 10mg  daily F/u in 3 months

## 2017-04-01 NOTE — Assessment & Plan Note (Signed)
Chronic problem - no bleeding or change in stool caliber Start daily miralax and titrate to soft BM daily F/u prn

## 2017-04-01 NOTE — Assessment & Plan Note (Signed)
Discussed safety measures with patient [name] refer to PT to work on balance and safety training May benefit from multi-point cane - [name] let PT eval for this

## 2017-04-01 NOTE — Assessment & Plan Note (Signed)
Not on a statin for several years Recheck lipid panel

## 2017-04-01 NOTE — Assessment & Plan Note (Signed)
Well controlled Continue low dose amlodipine and HCTZ Recent CMP wnl F/u 6 months

## 2017-04-01 NOTE — Assessment & Plan Note (Signed)
A1c stable at 5.9 Discussed low carb diet and exercise F/u in 6 months

## 2017-04-02 ENCOUNTER — Telehealth: Payer: Self-pay

## 2017-04-02 DIAGNOSIS — M7502 Adhesive capsulitis of left shoulder: Secondary | ICD-10-CM | POA: Diagnosis not present

## 2017-04-02 NOTE — Telephone Encounter (Signed)
-----   Message from [name], MD sent at 04/02/2017  9:36 AM EDT ----- Cholesterol looks good.  No need for statin at this time.  [name], MD, MPH [facility] 04/02/2017 9:36 AM

## 2017-04-02 NOTE — Telephone Encounter (Signed)
Pt advised.

## 2017-04-04 DIAGNOSIS — M7502 Adhesive capsulitis of left shoulder: Secondary | ICD-10-CM | POA: Diagnosis not present

## 2017-04-07 DIAGNOSIS — M7502 Adhesive capsulitis of left shoulder: Secondary | ICD-10-CM | POA: Diagnosis not present

## 2017-04-09 DIAGNOSIS — M7502 Adhesive capsulitis of left shoulder: Secondary | ICD-10-CM | POA: Diagnosis not present

## 2017-04-09 NOTE — Progress Notes (Signed)
10:17 PM   [name] 1924-11-20 [id number]  Referring provider: [name], MD [address] [place], [place] [address]  Chief Complaint  Patient presents with  . PTNS    Urinary Frequency    HPI: Patient is an 83 year old Caucasian male who presents today for PTNS treatment for his urinary frequency. This will be a maintenance treatment.   Background history [name] has a long-standing history of urinary frequency and dribbling. He cannot recall a specific event when his urinary symptoms started. He states he has a constant dribbling of a small amount of urine. This occurs all day. He has not experienced an increase in the dribbling with coughing, sneezing, laughing, change in position or bending and lifting.   Looking back at his initial PTNS treatment, he was having day time voids every hour, three nightly voids and always dribbling.  So, he has made significant progress.   After his last treatment, he is complaining of frequency, urgency and nocturia.  He is making 8 (worse) trips to the bathroom during the daytime. He states he gets up 2 (stable) times nightly. He is not experiencing burning or pain with urination. He has a mild (improved) urgency to urinate. He is having 0 (stable) episodes of incontinence daily.  He feels the treatments are beneficial and would like to continue the treatment.    He does not consume any caffeine beverages, he has 1-2 glasses of wine daily and he drinks eight 8 ounce glasses of water daily.    Previous Therapy: He has been tried on tamsulosin and Toviaz without improvement in his urinary symptoms.    He does not have any contraindications present for PTNS, such as: Pacemaker, Implantable defibrillator, History of abnormal bleeding or History of neuropathies or nerve damage.Contraindications present for PTNS   Discussed with patient possible complications of procedure, such as discomfort,  bleeding at insertion/stimulation site, procedure consent signed  Patient goals: The patient's goals were to reduce his urgency and his frequency.   ROS UROLOGY Frequent Urination?: Yes Hard to postpone urination?: Yes Burning/pain with urination?: No Get up at night to urinate?: Yes Leakage of urine?: No Urine stream starts and stops?: No Trouble starting stream?: No Do you have to strain to urinate?: No Blood in urine?: No Urinary tract infection?: No Sexually transmitted disease?: No Injury to kidneys or bladder?: No Painful intercourse?: No Weak stream?: No Erection problems?: No Penile pain?: No Gastrointestinal Nausea?: No Vomiting?: No Indigestion/heartburn?: No Diarrhea?: No Constipation?: No Constitutional Fever: No Night sweats?: No Weight loss?: No Fatigue?: No Skin Skin rash/lesions?: No Itching?: No Eyes Blurred vision?: No Double vision?: No Ears/Nose/Throat Sore throat?: No Sinus problems?: No Hematologic/Lymphatic Swollen glands?: No Easy bruising?: No Cardiovascular Leg swelling?: No Chest pain?: No Respiratory Cough?: No Shortness of breath?: No Endocrine Excessive thirst?: No Musculoskeletal Back pain?: No Joint pain?: No Neurological Headaches?: No Dizziness?: No Psychologic Depression?: No Anxiety?: No   Physical Exam Blood pressure 131/72, pulse 64, height 5\' 9"  (1.753 m), weight 181 lb (82.1 kg). Constitutional: Well nourished. Alert and oriented, No acute distress. HEENT:  AT, moist mucus membranes. Trachea midline, no masses. Cardiovascular: No clubbing, cyanosis, or edema. Respiratory: Normal respiratory effort, no increased work of breathing. Skin: No rashes, bruises or suspicious lesions. Lymph: No cervical or inguinal adenopathy. Neurologic: Grossly intact, no focal deficits, moving all 4 extremities. Psychiatric: Normal mood and affect.    1. Urinary incontinence  PTNS treatment:   The needle  electrode  was inserted into the lower, inner aspect of the patient's right leg. The surface electrode was placed on the inside arch of the foot on the treatment leg. The lead set was connected to the stimulator, and the needle electrode clip was connected to the needle electrode. The stimulator that produces an adjustable electrical pulse that travels to sacral nerve plexus via the tibial nerve was increased to 8 until a sensory response and toe flex was received.    Treatment Plan:  Patient tolerated the PTNS treatment well for 30 minutes. The electrode was removed with out difficulty. He will return in one monthfor his maintenance PTNS treatment.

## 2017-04-10 ENCOUNTER — Ambulatory Visit (INDEPENDENT_AMBULATORY_CARE_PROVIDER_SITE_OTHER): Payer: Medicare Other | Admitting: Urology

## 2017-04-10 ENCOUNTER — Encounter: Payer: Self-pay | Admitting: Urology

## 2017-04-10 VITALS — BP 131/72 | HR 64 | Ht 69.0 in | Wt 181.0 lb

## 2017-04-10 DIAGNOSIS — N3941 Urge incontinence: Secondary | ICD-10-CM | POA: Diagnosis not present

## 2017-04-10 NOTE — Progress Notes (Signed)
PTNS  Session # Maint # 21  Health & Social Factors: No Change Caffeine: 0 Alcohol: 0 Daytime voids #per day: 8 Night-time voids #per night: 2 Urgency: Mild Incontinence Episodes #per day: 0 Ankle used: Right Treatment Setting: 8 Feeling/ Response: Both  Preformed By: Zara Council PA-C   Follow Up: One Month

## 2017-04-11 DIAGNOSIS — M7502 Adhesive capsulitis of left shoulder: Secondary | ICD-10-CM | POA: Diagnosis not present

## 2017-04-14 DIAGNOSIS — M7502 Adhesive capsulitis of left shoulder: Secondary | ICD-10-CM | POA: Diagnosis not present

## 2017-04-16 DIAGNOSIS — M7502 Adhesive capsulitis of left shoulder: Secondary | ICD-10-CM | POA: Diagnosis not present

## 2017-04-18 DIAGNOSIS — M7502 Adhesive capsulitis of left shoulder: Secondary | ICD-10-CM | POA: Diagnosis not present

## 2017-04-21 DIAGNOSIS — M7502 Adhesive capsulitis of left shoulder: Secondary | ICD-10-CM | POA: Diagnosis not present

## 2017-04-23 DIAGNOSIS — M7502 Adhesive capsulitis of left shoulder: Secondary | ICD-10-CM | POA: Diagnosis not present

## 2017-04-25 DIAGNOSIS — M7502 Adhesive capsulitis of left shoulder: Secondary | ICD-10-CM | POA: Diagnosis not present

## 2017-04-28 DIAGNOSIS — M7502 Adhesive capsulitis of left shoulder: Secondary | ICD-10-CM | POA: Diagnosis not present

## 2017-04-30 DIAGNOSIS — M7502 Adhesive capsulitis of left shoulder: Secondary | ICD-10-CM | POA: Diagnosis not present

## 2017-05-01 DIAGNOSIS — Z23 Encounter for immunization: Secondary | ICD-10-CM | POA: Diagnosis not present

## 2017-05-02 DIAGNOSIS — M7502 Adhesive capsulitis of left shoulder: Secondary | ICD-10-CM | POA: Diagnosis not present

## 2017-05-04 ENCOUNTER — Other Ambulatory Visit: Payer: Self-pay | Admitting: Physician Assistant

## 2017-05-04 DIAGNOSIS — I10 Essential (primary) hypertension: Secondary | ICD-10-CM

## 2017-05-04 DIAGNOSIS — E7800 Pure hypercholesterolemia, unspecified: Secondary | ICD-10-CM

## 2017-05-05 ENCOUNTER — Other Ambulatory Visit: Payer: Self-pay | Admitting: *Deleted

## 2017-05-05 ENCOUNTER — Other Ambulatory Visit: Payer: Medicare Other

## 2017-05-05 DIAGNOSIS — Z8546 Personal history of malignant neoplasm of prostate: Secondary | ICD-10-CM | POA: Diagnosis not present

## 2017-05-05 DIAGNOSIS — M7502 Adhesive capsulitis of left shoulder: Secondary | ICD-10-CM | POA: Diagnosis not present

## 2017-05-06 LAB — PSA: PROSTATE SPECIFIC AG, SERUM: 0.6 ng/mL (ref 0.0–4.0)

## 2017-05-07 DIAGNOSIS — M7502 Adhesive capsulitis of left shoulder: Secondary | ICD-10-CM | POA: Diagnosis not present

## 2017-05-07 NOTE — Progress Notes (Signed)
05/08/2017 12:04 PM   [name] Zane Herald 08-19-1924 [id number]  Referring provider: Mar Daring, PA-C [place], [place] [address]  No chief complaint on file.   HPI: Patient is an 82 year old Caucasian male who presents today for a 6 month follow up for prostate cancer.  Prostate cancer Patient who underwent cryotherapy for a Gleason's 6 (10 cores + out of 12)-invasion present with a PSA of 4.3 and a 11% free PSA PCa in 2009 with [name] presents today for a 6 month follow up.  He has not had any weight loss, bone pain or appetite loss.  He generally feels well.  He has not had any fevers, chills, nauseas or vomiting.  He has not had any dysuria, gross hematuria or suprapubic pain.  His I PSS score today is 8/2.  His previous I PSS was 8/3.  His major complaints are urgency and nocturia x 2.  His current PSA is 0.6 ng/mL on 05/05/2017, unchanged from 6 months ago.       IPSS    Row Name 05/08/17 1100         International Prostate Symptom Score   How often have you had the sensation of not emptying your bladder? Almost always     How often have you had to urinate less than every two hours? Less than 1 in 5 times     How often have you found you stopped and started again several times when you urinated? Not at All     How often have you found it difficult to postpone urination? Not at All     How often have you had a weak urinary stream? Not at All     How often have you had to strain to start urination? Not at All     How many times did you typically get up at night to urinate? 2 Times     Total IPSS Score 8       Quality of Life due to urinary symptoms   If you were to spend the rest of your life with your urinary condition just the way it is now how would you feel about that? Mostly Satisfied        Score:  1-7 Mild 8-19 Moderate 20-35 Severe    PMH: Past Medical History:  Diagnosis Date  . Arthritis   . Bladder outlet  obstruction   . Erectile dysfunction   . GERD (gastroesophageal reflux disease)   . Hard of hearing   . HBP (high blood pressure)   . History of nephrolithiasis   . HLD (hyperlipidemia)   . Incomplete bladder emptying   . Nocturia   . Osteoporosis   . Prostate cancer ([place])   . Shingles 2014  . Sinus problem   . Stomach ulcer   . Stroke ([facility]) 2012  . Urinary frequency   . Urinary leakage     Surgical History: Past Surgical History:  Procedure Laterality Date  . CATARACT EXTRACTION  2000, 2001  . CHOLECYSTECTOMY  1972  . PROSTATE SURGERY  2008/2009  . TONSILLECTOMY    . UMBILICAL HERNIA REPAIR      Home Medications:  Allergies as of 05/08/2017      Reactions   Toviaz [fesoterodine Fumarate Er] Other (See Comments)   Acid reflux      Medication List       Accurate as of 05/08/17 12:04 PM. Always use your most recent med  list.          acetaminophen 325 MG tablet Commonly known as:  TYLENOL Take 650 mg by mouth every 6 (six) hours as needed.   Aloe Vera 25 MG Caps Take 100 mg by mouth daily.   amLODipine 2.5 MG tablet Commonly known as:  NORVASC TAKE ONE TABLET BY MOUTH ONCE DAILY   aspirin 81 MG tablet Take 81 mg by mouth daily.   docusate sodium 100 MG capsule Commonly known as:  COLACE Take 1 capsule (100 mg total) by mouth 2 (two) times daily as needed for mild constipation or moderate constipation.   escitalopram 10 MG tablet Commonly known as:  LEXAPRO Take 1 tablet (10 mg total) by mouth at bedtime.   glucosamine-chondroitin 500-400 MG tablet Take 1 tablet by mouth 2 (two) times daily.   hydrochlorothiazide 12.5 MG capsule Commonly known as:  MICROZIDE TAKE 1 CAPSULE BY MOUTH ONCE DAILY   losartan 25 MG tablet Commonly known as:  COZAAR TAKE 1 TABLET BY MOUTH ONCE DAILY   multivitamin capsule Take 1 capsule by mouth daily.   naproxen sodium 220 MG tablet Commonly known as:  ALEVE Take 440 mg by mouth as needed.   OVER THE COUNTER  MEDICATION Take by mouth 1 day or 1 dose. ESTER C   polyethylene glycol powder powder Commonly known as:  GLYCOLAX/MIRALAX Take 17 g by mouth daily.   tamsulosin 0.4 MG Caps capsule Commonly known as:  FLOMAX TAKE ONE CAPSULE BY MOUTH ONCE DAILY   triamcinolone ointment 0.5 % Commonly known as:  KENALOG Apply 1 application topically 2 (two) times daily.   zinc sulfate 220 (50 Zn) MG capsule Take 220 mg by mouth daily.       Allergies:  Allergies  Allergen Reactions  . Toviaz [Fesoterodine Fumarate Er] Other (See Comments)    Acid reflux    Family History: Family History  Problem Relation Age of Onset  . Cancer Mother   . Stroke Father   . Stroke Sister   . Prostate cancer Brother   . Kidney disease Neg Hx   . Kidney cancer Neg Hx   . Bladder Cancer Neg Hx     Social History:  reports that he quit smoking about 54 years ago. He quit after 10.00 years of use. He has never used smokeless tobacco. He reports that he does not drink alcohol or use drugs.  ROS: UROLOGY Frequent Urination?: No Hard to postpone urination?: No Burning/pain with urination?: No Get up at night to urinate?: Yes Leakage of urine?: Yes Urine stream starts and stops?: No Trouble starting stream?: No Do you have to strain to urinate?: No Blood in urine?: No Urinary tract infection?: No Sexually transmitted disease?: No Injury to kidneys or bladder?: No Painful intercourse?: No Weak stream?: No Erection problems?: No Penile pain?: No  Gastrointestinal Nausea?: No Vomiting?: No Indigestion/heartburn?: No Diarrhea?: No Constipation?: No  Constitutional Fever: No Night sweats?: No Weight loss?: No Fatigue?: No  Skin Skin rash/lesions?: No Itching?: No  Eyes Blurred vision?: No Double vision?: No  Ears/Nose/Throat Sore throat?: No Sinus problems?: No  Hematologic/Lymphatic Swollen glands?: No Easy bruising?: No  Cardiovascular Leg swelling?: No Chest pain?:  No  Respiratory Cough?: No Shortness of breath?: No  Endocrine Excessive thirst?: No  Musculoskeletal Back pain?: No Joint pain?: No  Neurological Headaches?: No Dizziness?: No  Psychologic Depression?: No Anxiety?: No  Physical Exam: BP 136/80   Pulse 65   Ht 5\' 9"  (1.753 m)  Wt 181 lb (82.1 kg)   BMI 26.73 kg/m   Constitutional: Well nourished. Alert and oriented, No acute distress. HEENT: [place] AT, moist mucus membranes. Trachea midline, no masses. Cardiovascular: No clubbing, cyanosis, or edema. Respiratory: Normal respiratory effort, no increased work of breathing. GI: Abdomen is soft, non tender, non distended, no abdominal masses. Liver and spleen not palpable.  No hernias appreciated.  Stool sample for occult testing is not indicated.   GU: No CVA tenderness.  No bladder fullness or masses.  Patient with circumcised phallus.  Urethral meatus is patent.  No penile discharge. No penile lesions or rashes. Scrotum without lesions, cysts, rashes and/or edema.  Testicles are located scrotally bilaterally. No masses are appreciated in the testicles. Left and right epididymis are normal. Rectal: Patient with  normal sphincter tone. Anus and perineum without scarring or rashes. No rectal masses are appreciated. Prostate is small, firm, no nodules are appreciated. Seminal vesicles are normal. Skin: No rashes, bruises or suspicious lesions. Lymph: No cervical or inguinal adenopathy. Neurologic: Grossly intact, no focal deficits, moving all 4 extremities. Psychiatric: Normal mood and affect.  Laboratory Data: PSA History  0.6 ng/mL on 04/29/2016  0.6 ng/mL on 11/01/2016  0.6 ng/mL on 05/05/2017  Lab Results  Component Value Date   WBC 4.4 01/30/2017   HGB 14.0 01/30/2017   HCT 40.7 01/30/2017   MCV 88 01/30/2017   PLT 240 01/30/2017    Lab Results  Component Value Date   CREATININE 1.01 01/30/2017    Lab Results  Component Value Date   HGBA1C 5.9 03/31/2017        Component Value Date/Time   CHOL 173 04/01/2017 1330   CHOL 147 09/15/2015 1043   HDL 61 04/01/2017 1330   HDL 59 09/15/2015 1043   CHOLHDL 2.8 04/01/2017 1330   LDLCALC 74 09/15/2015 1043    Lab Results  Component Value Date   AST 30 01/30/2017   Lab Results  Component Value Date   ALT 21 01/30/2017   I have reviewed the labs.  Assessment & Plan:    1. Prostate cancer: Patient underwent cryotherapy for a Gleason's 6 (10 cores + out of 12)-invasion present with a PSA of 4.3 and a 11% free PSA PCa in 2009. His most recent PSA was 0.6 ng/mL on 05/05/2017..   IPSS is stable.  He will return in 6 months for a repeated IPSS score, PSA and exam.    Return in about 6 months (around 11/05/2017) for IPSS, PSA and exam.  These notes generated with voice recognition software. I apologize for typographical errors.  Zara Council, [facility] [address] [place], [place] [address] [phone]

## 2017-05-08 ENCOUNTER — Encounter: Payer: Self-pay | Admitting: Urology

## 2017-05-08 ENCOUNTER — Ambulatory Visit (INDEPENDENT_AMBULATORY_CARE_PROVIDER_SITE_OTHER): Payer: Medicare Other | Admitting: Urology

## 2017-05-08 VITALS — BP 136/80 | HR 65 | Ht 69.0 in | Wt 181.0 lb

## 2017-05-08 DIAGNOSIS — C61 Malignant neoplasm of prostate: Secondary | ICD-10-CM

## 2017-05-08 LAB — BLADDER SCAN AMB NON-IMAGING

## 2017-05-09 DIAGNOSIS — M7502 Adhesive capsulitis of left shoulder: Secondary | ICD-10-CM | POA: Diagnosis not present

## 2017-05-09 NOTE — Progress Notes (Signed)
7:15 PM   Rosevelt Luu Hollingsworth June 24, 1925 [id number]  Referring provider: [name], MD [address] [place], [place] [address]  Chief Complaint  Patient presents with  . PTNS    HPI: Patient is an 83 year old Caucasian male who presents today for PTNS treatment for his urinary frequency. This will be a maintenance treatment.   Background history Mr. Goin has a long-standing history of urinary frequency and dribbling. He cannot recall a specific event when his urinary symptoms started. He states he has a constant dribbling of a small amount of urine. This occurs all day. He has not experienced an increase in the dribbling with coughing, sneezing, laughing, change in position or bending and lifting.   Looking back at his initial PTNS treatment, he was having day time voids every hour, three nightly voids and always dribbling.  So, he has made significant progress.   After his last treatment, he is complaining of frequency, urgency and nocturia.  He is making 7-8 (stable) trips to the bathroom during the daytime. He states he gets up 2 (stable) times nightly. He is not experiencing burning or pain with urination. He has a mild (stable) urgency to urinate. He is having 0 (stable) episodes of incontinence daily.  He feels the treatments are beneficial and would like to continue the treatment.    He does not consume any caffeine beverages, he has 1-2 glasses of wine daily and he drinks eight 8 ounce glasses of water daily.    Previous Therapy: He has been tried on tamsulosin and Toviaz without improvement in his urinary symptoms.    He does not have any contraindications present for PTNS, such as: Pacemaker, Implantable defibrillator, History of abnormal bleeding or History of neuropathies or nerve damage.Contraindications present for PTNS   Discussed with patient possible complications of procedure, such as discomfort, bleeding at  insertion/stimulation site, procedure consent signed  Patient goals: The patient's goals were to reduce his urgency and his frequency.   ROS UROLOGY Frequent Urination?: No Hard to postpone urination?: No Burning/pain with urination?: No Get up at night to urinate?: Yes Leakage of urine?: Yes Urine stream starts and stops?: No Trouble starting stream?: No Do you have to strain to urinate?: No Blood in urine?: No Urinary tract infection?: No Sexually transmitted disease?: No Injury to kidneys or bladder?: No Painful intercourse?: No Weak stream?: No Erection problems?: No Penile pain?: No Gastrointestinal Nausea?: No Vomiting?: No Indigestion/heartburn?: No Diarrhea?: No Constipation?: No Constitutional Fever: No Night sweats?: No Weight loss?: No Fatigue?: No Skin Skin rash/lesions?: No Itching?: No Eyes Blurred vision?: No Double vision?: No Ears/Nose/Throat Sore throat?: No Sinus problems?: No Hematologic/Lymphatic Swollen glands?: No Easy bruising?: No Cardiovascular Leg swelling?: No Chest pain?: No Respiratory Cough?: No Shortness of breath?: No Endocrine Excessive thirst?: No Musculoskeletal Back pain?: No Joint pain?: No Neurological Headaches?: No Dizziness?: No Psychologic Depression?: No Anxiety?: No   Physical Exam Blood pressure (!) 154/78, pulse 72, height 5\' 9"  (1.753 m), weight 180 lb 1.6 oz (81.7 kg). Constitutional: Well nourished. Alert and oriented, No acute distress. HEENT:  AT, moist mucus membranes. Trachea midline, no masses. Cardiovascular: No clubbing, cyanosis, or edema. Respiratory: Normal respiratory effort, no increased work of breathing. Skin: No rashes, bruises or suspicious lesions. Lymph: No cervical or inguinal adenopathy. Neurologic: Grossly intact, no focal deficits, moving all 4 extremities. Psychiatric: Normal mood and affect.    1. Urinary incontinence  PTNS treatment:   The needle electrode was  inserted into the lower, inner aspect of the patient's left leg. The surface electrode was placed on the inside arch of the foot on the treatment leg. The lead set was connected to the stimulator, and the needle electrode clip was connected to the needle electrode. The stimulator that produces an adjustable electrical pulse that travels to sacral nerve plexus via the tibial nerve was increased to 14 until a sensory response and toe flex was received.    Treatment Plan:  Patient tolerated the PTNS treatment well for 30 minutes. The electrode was removed with out difficulty. He will return in one monthfor his maintenance PTNS treatment.

## 2017-05-12 ENCOUNTER — Ambulatory Visit (INDEPENDENT_AMBULATORY_CARE_PROVIDER_SITE_OTHER): Payer: Medicare Other | Admitting: Urology

## 2017-05-12 ENCOUNTER — Encounter: Payer: Self-pay | Admitting: Urology

## 2017-05-12 VITALS — BP 154/78 | HR 72 | Ht 69.0 in | Wt 180.1 lb

## 2017-05-12 DIAGNOSIS — N3941 Urge incontinence: Secondary | ICD-10-CM

## 2017-05-12 DIAGNOSIS — M7502 Adhesive capsulitis of left shoulder: Secondary | ICD-10-CM | POA: Diagnosis not present

## 2017-05-12 NOTE — Progress Notes (Signed)
PTNS  Session # Maintenance #22  Health & Social Factors: No Change Caffeine: 0 Alcohol: 0 Daytime voids #per day: 7-8 Night-time voids #per night: 2 Urgency: Mild Incontinence Episodes #per day: 0 Ankle used: Left Treatment Setting: 14 Feeling/ Response: Both  Preformed By: Zara Council, PA  Assistant: Reece Packer, RN

## 2017-05-12 NOTE — Patient Instructions (Signed)
3

## 2017-05-14 DIAGNOSIS — M7502 Adhesive capsulitis of left shoulder: Secondary | ICD-10-CM | POA: Diagnosis not present

## 2017-05-16 DIAGNOSIS — M7502 Adhesive capsulitis of left shoulder: Secondary | ICD-10-CM | POA: Diagnosis not present

## 2017-05-19 DIAGNOSIS — M7502 Adhesive capsulitis of left shoulder: Secondary | ICD-10-CM | POA: Diagnosis not present

## 2017-05-21 DIAGNOSIS — M7502 Adhesive capsulitis of left shoulder: Secondary | ICD-10-CM | POA: Diagnosis not present

## 2017-05-23 DIAGNOSIS — M7502 Adhesive capsulitis of left shoulder: Secondary | ICD-10-CM | POA: Diagnosis not present

## 2017-05-26 DIAGNOSIS — M7502 Adhesive capsulitis of left shoulder: Secondary | ICD-10-CM | POA: Diagnosis not present

## 2017-05-26 DIAGNOSIS — H43813 Vitreous degeneration, bilateral: Secondary | ICD-10-CM | POA: Diagnosis not present

## 2017-05-28 DIAGNOSIS — M7502 Adhesive capsulitis of left shoulder: Secondary | ICD-10-CM | POA: Diagnosis not present

## 2017-05-31 DIAGNOSIS — M7502 Adhesive capsulitis of left shoulder: Secondary | ICD-10-CM | POA: Diagnosis not present

## 2017-06-02 DIAGNOSIS — M7502 Adhesive capsulitis of left shoulder: Secondary | ICD-10-CM | POA: Diagnosis not present

## 2017-06-04 DIAGNOSIS — M7502 Adhesive capsulitis of left shoulder: Secondary | ICD-10-CM | POA: Diagnosis not present

## 2017-06-06 DIAGNOSIS — M7502 Adhesive capsulitis of left shoulder: Secondary | ICD-10-CM | POA: Diagnosis not present

## 2017-06-09 DIAGNOSIS — M7502 Adhesive capsulitis of left shoulder: Secondary | ICD-10-CM | POA: Diagnosis not present

## 2017-06-11 DIAGNOSIS — M7502 Adhesive capsulitis of left shoulder: Secondary | ICD-10-CM | POA: Diagnosis not present

## 2017-06-11 NOTE — Progress Notes (Signed)
4:05 PM   Afnan Emberton Escareno [birth year] [id number]  Referring provider: [name], MD [address] [place], [place] [address]  Chief Complaint  Patient presents with  . PTNS    HPI: Patient is an 83 year old Caucasian male who presents today for PTNS treatment for his urinary frequency. This will be a maintenance treatment.   Background history Mr. Walberg has a long-standing history of urinary frequency and dribbling. He cannot recall a specific event when his urinary symptoms started. He states he has a constant dribbling of a small amount of urine. This occurs all day. He has not experienced an increase in the dribbling with coughing, sneezing, laughing, change in position or bending and lifting.   Looking back at his initial PTNS treatment, he was having day time voids every hour, three nightly voids and always dribbling.  So, he has made significant progress.   After his last treatment, he is complaining of an increased urgency, dribbling and frequency due to a cough.  He is making 7-8 (stable) trips to the bathroom during the daytime. He states he gets up 2 (stable) times nightly. He is not experiencing burning or pain with urination. He has a strong (worse) urgency to urinate. He is having 2-3 (worse) episodes of incontinence daily.  He feels the treatments are beneficial and would like to continue the treatment.    He does not consume any caffeine beverages, he has 1-2 glasses of wine daily and he drinks eight 8 ounce glasses of water daily.    Previous Therapy: He has been tried on tamsulosin and Toviaz without improvement in his urinary symptoms.    He does not have any contraindications present for PTNS, such as: Pacemaker, Implantable defibrillator, History of abnormal bleeding or History of neuropathies or nerve damage.Contraindications present for PTNS   Discussed with patient possible complications of procedure, such as  discomfort, bleeding at insertion/stimulation site, procedure consent signed  Patient goals: The patient's goals were to reduce his urgency and his frequency.   ROS UROLOGY Frequent Urination?: Yes Hard to postpone urination?: Yes Burning/pain with urination?: No Get up at night to urinate?: Yes Leakage of urine?: Yes Urine stream starts and stops?: No Trouble starting stream?: No Do you have to strain to urinate?: No Blood in urine?: No Urinary tract infection?: No Sexually transmitted disease?: No Injury to kidneys or bladder?: No Painful intercourse?: No Weak stream?: No Erection problems?: No Penile pain?: No Gastrointestinal Nausea?: No Vomiting?: No Indigestion/heartburn?: No Diarrhea?: No Constipation?: No Constitutional Fever: No Night sweats?: No Weight loss?: No Fatigue?: No Skin Skin rash/lesions?: Yes Itching?: Yes Eyes Blurred vision?: No Double vision?: No Ears/Nose/Throat Sore throat?: No Sinus problems?: No Hematologic/Lymphatic Swollen glands?: No Easy bruising?: Yes Cardiovascular Leg swelling?: Yes Chest pain?: No Respiratory Cough?: Yes Shortness of breath?: No Endocrine Excessive thirst?: No Musculoskeletal Back pain?: No Joint pain?: No Neurological Headaches?: No Dizziness?: No Psychologic Depression?: No Anxiety?: No   Physical Exam Blood pressure (!) 163/72, pulse 70, height 5\' 9"  (1.753 m), weight 181 lb 8 oz (82.3 kg). Constitutional: Well nourished. Alert and oriented, No acute distress. HEENT: [place] AT, moist mucus membranes. Trachea midline, no masses. Cardiovascular: No clubbing, cyanosis, or edema. Respiratory: Normal respiratory effort, no increased work of breathing. Skin: No rashes, bruises or suspicious lesions. Lymph: No cervical or inguinal adenopathy. Neurologic: Grossly intact, no focal deficits, moving all 4 extremities. Psychiatric: Normal mood and affect.    1. Urinary incontinence  PTNS  treatment:   The needle electrode was inserted into the lower, inner aspect of the patient's left leg. The surface electrode was placed on the inside arch of the foot on the treatment leg. The lead set was connected to the stimulator, and the needle electrode clip was connected to the needle electrode. The stimulator that produces an adjustable electrical pulse that travels to sacral nerve plexus via the tibial nerve was increased to 12 until a sensory response and toe flex was received.    Treatment Plan:  Patient tolerated the PTNS treatment well for 30 minutes. The electrode was removed with out difficulty. He will return in one monthfor his maintenance PTNS treatment.

## 2017-06-12 ENCOUNTER — Ambulatory Visit (INDEPENDENT_AMBULATORY_CARE_PROVIDER_SITE_OTHER): Payer: Medicare Other | Admitting: Urology

## 2017-06-12 ENCOUNTER — Encounter: Payer: Self-pay | Admitting: Urology

## 2017-06-12 VITALS — BP 163/72 | HR 70 | Ht 69.0 in | Wt 181.5 lb

## 2017-06-12 DIAGNOSIS — N3941 Urge incontinence: Secondary | ICD-10-CM

## 2017-06-12 NOTE — Progress Notes (Signed)
PTNS  Session # Maintenance 22  Health & Social Factors: Increased Cough which has increased frequency, urgency and dribbling per patient. Caffeine: 0 Alcohol: 0 Daytime voids #per day: 7-8 Night-time voids #per night: 2 Urgency: Strong Incontinence Episodes #per day: 2-3 Ankle used: Left Treatment Setting: 12 Feeling/ Response: Sensory and Toe Flex  Performed By: Zara Council, PAC  Assistant: Reece Packer, RN

## 2017-06-13 DIAGNOSIS — M7502 Adhesive capsulitis of left shoulder: Secondary | ICD-10-CM | POA: Diagnosis not present

## 2017-06-18 DIAGNOSIS — M7502 Adhesive capsulitis of left shoulder: Secondary | ICD-10-CM | POA: Diagnosis not present

## 2017-06-19 DIAGNOSIS — M7502 Adhesive capsulitis of left shoulder: Secondary | ICD-10-CM | POA: Diagnosis not present

## 2017-06-20 DIAGNOSIS — M7502 Adhesive capsulitis of left shoulder: Secondary | ICD-10-CM | POA: Diagnosis not present

## 2017-06-23 DIAGNOSIS — M7502 Adhesive capsulitis of left shoulder: Secondary | ICD-10-CM | POA: Diagnosis not present

## 2017-06-25 DIAGNOSIS — M7502 Adhesive capsulitis of left shoulder: Secondary | ICD-10-CM | POA: Diagnosis not present

## 2017-06-27 DIAGNOSIS — M7502 Adhesive capsulitis of left shoulder: Secondary | ICD-10-CM | POA: Diagnosis not present

## 2017-07-11 ENCOUNTER — Ambulatory Visit (INDEPENDENT_AMBULATORY_CARE_PROVIDER_SITE_OTHER): Payer: Medicare Other | Admitting: Family Medicine

## 2017-07-11 ENCOUNTER — Encounter: Payer: Self-pay | Admitting: Family Medicine

## 2017-07-11 VITALS — BP 132/68 | HR 75 | Temp 98.4°F | Resp 16 | Wt 182.0 lb

## 2017-07-11 DIAGNOSIS — L309 Dermatitis, unspecified: Secondary | ICD-10-CM | POA: Diagnosis not present

## 2017-07-11 MED ORDER — TRIAMCINOLONE ACETONIDE 0.5 % EX OINT: 1.0000 "application " | TOPICAL_OINTMENT | Freq: Two times a day (BID) | CUTANEOUS | 3 refills | Status: DC

## 2017-07-11 MED ORDER — HYDROXYZINE HCL 10 MG PO TABS: 5.0000 mg | ORAL_TABLET | Freq: Every evening | ORAL | 0 refills | Status: DC | PRN

## 2017-07-11 NOTE — Assessment & Plan Note (Signed)
Uncontrolled with current flare Resume triamcinolone to all affected areas BID Continue at least BID emollient application diffusely Trial of daily Zyrtec to help with atopy Can try low dose hydroxyzine at bedtime for itching temporarily - warned about possible dizziness Return precautions discussed

## 2017-07-11 NOTE — Patient Instructions (Signed)

## 2017-07-11 NOTE — Progress Notes (Signed)
Patient: [name] Male    DOB: [birth year]   84 y.o.   MRN: [id number] Visit Date: 07/11/2017  Today's Provider: Lavon Paganini, MD   I, Martha Clan, CMA, am acting as scribe for Lavon Paganini, MD.  Chief Complaint  Patient presents with  . Rash   Subjective:    Rash  This is a recurrent problem. Episode onset: x several months. The affected locations include the torso, chest, back, left arm, right arm and scalp. The rash is characterized by itchiness. He was exposed to nothing. Pertinent negatives include no cough, facial edema, fever, rhinorrhea, shortness of breath, sore throat or vomiting.  He was seen on 02/12/2017 for eczema. He was prescribed Triamcinolone to use BID - hasn't used for ~1 month and just started back a few days ago. He has also tried Aquaphor, without relief of sx.  Wondering if it might be something other than eczema     Allergies  Allergen Reactions  . Toviaz [Fesoterodine Fumarate Er] Other (See Comments)    Acid reflux     Current Outpatient Medications:  .  Aloe Vera 25 MG CAPS, Take 100 mg by mouth daily. , Disp: , Rfl:  .  amLODipine (NORVASC) 2.5 MG tablet, TAKE ONE TABLET BY MOUTH ONCE DAILY, Disp: 90 tablet, Rfl: 1 .  aspirin 81 MG tablet, Take 81 mg by mouth daily., Disp: , Rfl:  .  docusate sodium (COLACE) 100 MG capsule, Take 1 capsule (100 mg total) by mouth 2 (two) times daily as needed for mild constipation or moderate constipation., Disp: 30 capsule, Rfl: 0 .  escitalopram (LEXAPRO) 10 MG tablet, Take 1 tablet (10 mg total) by mouth at bedtime., Disp: 90 tablet, Rfl: 3 .  hydrochlorothiazide (MICROZIDE) 12.5 MG capsule, TAKE 1 CAPSULE BY MOUTH ONCE DAILY, Disp: 90 capsule, Rfl: 1 .  losartan (COZAAR) 25 MG tablet, TAKE 1 TABLET BY MOUTH ONCE DAILY, Disp: 90 tablet, Rfl: 1 .  Multiple Vitamin (MULTIVITAMIN) capsule, Take 1 capsule by mouth daily., Disp: , Rfl:  .  naproxen sodium (ANAPROX) 220 MG tablet, Take 440 mg  by mouth as needed., Disp: , Rfl:  .  OVER THE COUNTER MEDICATION, Take by mouth 1 day or 1 dose. ESTER C, Disp: , Rfl:  .  OVER THE COUNTER MEDICATION, , Disp: , Rfl:  .  polyethylene glycol powder (GLYCOLAX/MIRALAX) powder, Take 17 g by mouth daily., Disp: 255 g, Rfl: 11 .  tamsulosin (FLOMAX) 0.4 MG CAPS capsule, TAKE ONE CAPSULE BY MOUTH ONCE DAILY, Disp: 90 capsule, Rfl: 3 .  triamcinolone ointment (KENALOG) 0.5 %, Apply 1 application topically 2 (two) times daily., Disp: 60 g, Rfl: 3 .  zinc sulfate 220 (50 Zn) MG capsule, Take 220 mg by mouth daily., Disp: , Rfl:  .  acetaminophen (TYLENOL) 325 MG tablet, Take 650 mg by mouth every 6 (six) hours as needed., Disp: , Rfl:  .  hydrOXYzine (ATARAX/VISTARIL) 10 MG tablet, Take 0.5-1 tablets (5-10 mg total) by mouth at bedtime as needed., Disp: 10 tablet, Rfl: 0  Current Facility-Administered Medications:  .  lidocaine-EPINEPHrine (XYLOCAINE W/EPI) 2 %-1:100000 (with pres) injection 3 mL, 3 mL, Intradermal, Once, Selisa Tensley, Dionne Bucy, MD .  methylPREDNISolone acetate (DEPO-MEDROL) injection 40 mg, 40 mg, Intramuscular, Once, Ardelle Haliburton, Dionne Bucy, MD  Review of Systems  Constitutional: Negative for fever.  HENT: Negative for rhinorrhea and sore throat.   Respiratory: Negative for cough and shortness of breath.   Gastrointestinal:  Negative for vomiting.  Skin: Positive for rash.    Social History   Tobacco Use  . Smoking status: Former Smoker    Years: 10.00    Last attempt to quit: 07/08/1962    Years since quitting: 55.0  . Smokeless tobacco: Never Used  . Tobacco comment: quit 1964  Substance Use Topics  . Alcohol use: No    Comment: 5 Glasses of Water / Wine   Objective:   BP 132/68 (BP Location: Right Arm, Patient Position: Sitting, Cuff Size: Normal)   Pulse 75   Temp 98.4 F (36.9 C) (Oral)   Resp 16   Wt 182 lb (82.6 kg)   SpO2 96%   BMI 26.88 kg/m  Vitals:   07/11/17 1344  BP: 132/68  Pulse: 75  Resp: 16  Temp:  98.4 F (36.9 C)  TempSrc: Oral  SpO2: 96%  Weight: 182 lb (82.6 kg)     Physical Exam  Constitutional: He is oriented to person, place, and time. He appears well-developed and well-nourished. No distress.  HENT:  Head: Normocephalic and atraumatic.  Eyes: Conjunctivae are normal. No scleral icterus.  Cardiovascular: Normal rate, regular rhythm, normal heart sounds and intact distal pulses.  No murmur heard. Pulmonary/Chest: Effort normal and breath sounds normal. No respiratory distress. He has no wheezes. He has no rales.  Musculoskeletal: He exhibits no edema.  Neurological: He is alert and oriented to person, place, and time.  Skin: Skin is warm and dry. Rash (eczema) noted.  Psychiatric: He has a normal mood and affect. His behavior is normal.  Vitals reviewed.      Assessment & Plan:      Problem List Items Addressed This Visit      Musculoskeletal and Integument   Eczema - Primary    Uncontrolled with current flare Resume triamcinolone to all affected areas BID Continue at least BID emollient application diffusely Trial of daily Zyrtec to help with atopy Can try low dose hydroxyzine at bedtime for itching temporarily - warned about possible dizziness Return precautions discussed      Relevant Medications   triamcinolone ointment (KENALOG) 0.5 %      Meds ordered this encounter  Medications  . hydrOXYzine (ATARAX/VISTARIL) 10 MG tablet    Sig: Take 0.5-1 tablets (5-10 mg total) by mouth at bedtime as needed.    Dispense:  10 tablet    Refill:  0  . triamcinolone ointment (KENALOG) 0.5 %    Sig: Apply 1 application topically 2 (two) times daily.    Dispense:  60 g    Refill:  3       Return if symptoms worsen or fail to improve.  The entirety of the information documented in the History of Present Illness, Review of Systems and Physical Exam were personally obtained by me. Portions of this information were initially documented by Raquel Sarna Ratchford, CMA and  reviewed by me for thoroughness and accuracy.    [name], MD, MPH [facility] 07/11/2017 2:27 PM

## 2017-07-16 NOTE — Progress Notes (Signed)
    9:25 PM   [name] 1924/09/19 [id number]  Referring provider: [name], MD [address] [place], [place] [address]  No chief complaint on file.   HPI: Patient is an 84 year old Caucasian male who presents today for PTNS treatment for his urinary frequency. This will be a maintenance treatment.   Background history [name] has a long-standing history of urinary frequency and dribbling. He cannot recall a specific event when his urinary symptoms started. He states he has a constant dribbling of a small amount of urine. This occurs all day. He has not experienced an increase in the dribbling with coughing, sneezing, laughing, change in position or bending and lifting.   Looking back at his initial PTNS treatment, he was having day time voids every hour, three nightly voids and always dribbling.  So, he has made significant progress.   After his last treatment, he is making 7-8 (stable) trips to the bathroom during the daytime. He states he gets up 4 (worsening) times nightly. He is not experiencing burning or pain with urination. He has a strong (stable) urgency to urinate. He is having 0 (improved) episodes of incontinence daily.  He still feels the treatments are beneficial and would like to continue the treatment.    He does not consume any caffeine beverages, he has 1-2 glasses of wine daily and he drinks eight 8 ounce glasses of water daily.    Previous Therapy: He has been tried on tamsulosin and Toviaz without improvement in his urinary symptoms.    He does not have any contraindications present for PTNS, such as: Pacemaker, Implantable defibrillator, History of abnormal bleeding or History of neuropathies or nerve damage.Contraindications present for PTNS   Discussed with patient possible complications of procedure, such as discomfort, bleeding at insertion/stimulation site, procedure consent signed  Patient goals: The  patient's goals were to reduce his urgency and his frequency.  1. Urinary incontinence  PTNS treatment:   The needle electrode was inserted into the lower, inner aspect of the patient's right leg. The surface electrode was placed on the inside arch of the foot on the treatment leg. The lead set was connected to the stimulator, and the needle electrode clip was connected to the needle electrode. The stimulator that produces an adjustable electrical pulse that travels to sacral nerve plexus via the tibial nerve was increased to 9 until a sensory response and toe flex was received.    Treatment Plan:  Patient tolerated the PTNS treatment well for 30 minutes. The electrode was removed with out difficulty. He will return in one monthfor his maintenance PTNS treatment.

## 2017-07-17 ENCOUNTER — Ambulatory Visit (INDEPENDENT_AMBULATORY_CARE_PROVIDER_SITE_OTHER): Payer: Medicare Other | Admitting: Urology

## 2017-07-17 DIAGNOSIS — N3941 Urge incontinence: Secondary | ICD-10-CM

## 2017-07-17 NOTE — Progress Notes (Signed)
PTNS  Session # Maintance #24  Health & Social Factors: Change Caffeine: 0 Alcohol: 0 Daytime voids #per day: 7-8 Night-time voids #per night: 4 Urgency: strong Incontinence Episodes #per day: 0 Ankle used: right Treatment Setting: 9 Feeling/ Response: both Comments:   Preformed By: Zara Council, PA  Assistant: Toniann Fail, LPN

## 2017-08-04 ENCOUNTER — Telehealth: Payer: Self-pay

## 2017-08-04 NOTE — Telephone Encounter (Signed)
Patient's wife called to report patient is feeling depressed and sad at this time due to his brother passing away yesterday morning. She wanted to know if patient could increase Lexapro 10 mg until his symptoms are stable. Please advise.   CB#[phone]

## 2017-08-05 NOTE — Telephone Encounter (Signed)
Pt advised.   Thanks,   -Kieffer Blatz  

## 2017-08-05 NOTE — Telephone Encounter (Signed)
lmtcb

## 2017-08-05 NOTE — Telephone Encounter (Signed)
Sorry to hear about this loss.  It appears patient is on 10mg  daily of Lexapro already.  This is the max dose for people over 65.  Would not increase this dose further.  Grief counseling would be helpful and is offered through Hosp[facility].  Let me know if there are other questions.  [name], MD, MPH [facility] 08/05/2017 8:14 AM

## 2017-08-21 ENCOUNTER — Ambulatory Visit (INDEPENDENT_AMBULATORY_CARE_PROVIDER_SITE_OTHER): Payer: Medicare Other

## 2017-08-21 DIAGNOSIS — R350 Frequency of micturition: Secondary | ICD-10-CM | POA: Diagnosis not present

## 2017-08-21 NOTE — Progress Notes (Signed)
PTNS  Session # Maint. 25  Health & Social Factors: no change Caffeine: 0 Alcohol: 0 Daytime voids #per day: 7-8 Night-time voids #per night: 4 Urgency: strong Incontinence Episodes #per day: 0 Ankle used: left Treatment Setting: 10 Feeling/ Response: both Comments:   Preformed By: Toniann Fail ,LPN

## 2017-08-26 ENCOUNTER — Telehealth: Payer: Self-pay

## 2017-08-26 NOTE — Telephone Encounter (Signed)
LMTCB and schedule AWV with [facility].  -MM

## 2017-09-04 ENCOUNTER — Other Ambulatory Visit: Payer: Self-pay | Admitting: Family Medicine

## 2017-09-04 DIAGNOSIS — I10 Essential (primary) hypertension: Secondary | ICD-10-CM

## 2017-09-05 NOTE — Telephone Encounter (Signed)
Has FU 09/15/2017.

## 2017-09-09 NOTE — Telephone Encounter (Signed)
Pt has a 2 month f/u scheduled 09/09/17. Will check back after that apt. -MM

## 2017-09-15 ENCOUNTER — Encounter: Payer: Self-pay | Admitting: Family Medicine

## 2017-09-15 ENCOUNTER — Ambulatory Visit (INDEPENDENT_AMBULATORY_CARE_PROVIDER_SITE_OTHER): Payer: Medicare Other | Admitting: Family Medicine

## 2017-09-15 ENCOUNTER — Other Ambulatory Visit: Payer: Self-pay | Admitting: Urology

## 2017-09-15 VITALS — BP 132/70 | HR 73 | Temp 97.8°F | Resp 16 | Wt 183.0 lb

## 2017-09-15 DIAGNOSIS — N401 Enlarged prostate with lower urinary tract symptoms: Secondary | ICD-10-CM

## 2017-09-15 DIAGNOSIS — L309 Dermatitis, unspecified: Secondary | ICD-10-CM | POA: Diagnosis not present

## 2017-09-15 DIAGNOSIS — R7303 Prediabetes: Secondary | ICD-10-CM

## 2017-09-15 DIAGNOSIS — F331 Major depressive disorder, recurrent, moderate: Secondary | ICD-10-CM | POA: Diagnosis not present

## 2017-09-15 DIAGNOSIS — I10 Essential (primary) hypertension: Secondary | ICD-10-CM | POA: Diagnosis not present

## 2017-09-15 LAB — POCT GLYCOSYLATED HEMOGLOBIN (HGB A1C)
Est. average glucose Bld gHb Est-mCnc: 126
HEMOGLOBIN A1C: 6

## 2017-09-15 NOTE — Telephone Encounter (Signed)
Called pt to r/s apt made today by MR. Pt was scheduled incorrectly in a 15 minute slot. All AWVs have to be in a 40 min slot. Apt is scheduled on 11/17/17 @ 1:00 PM. LMTCB. -MM

## 2017-09-15 NOTE — Progress Notes (Signed)
Patient: [name] Male    DOB: Dec 29, 1924   84 y.o.   MRN: [id number] Visit Date: 09/16/2017  Today's Provider: Lavon Paganini, MD   I, Martha Clan, CMA, am acting as scribe for Lavon Paganini, MD.  Chief Complaint  Patient presents with  . Hypertension  . Hyperglycemia  . Depression   Subjective:    HPI      Hypertension, follow-up:  BP Readings from Last 3 Encounters:  09/15/17 132/70  07/11/17 132/68  06/12/17 (!) 163/72    He was last seen for hypertension 6 months ago.  BP at that visit was 124/62. Management since that visit includes continuing meds. He reports good compliance with treatment. He is not having side effects.  He is exercising. He is adherent to low salt diet.   Outside blood pressures are not being checked as of recent . He is experiencing fatigue.  Patient denies chest pain, chest pressure/discomfort, dyspnea, exertional chest pressure/discomfort, irregular heart beat, lower extremity edema, near-syncope, orthopnea, palpitations and syncope.     Weight trend: increasing steadily Wt Readings from Last 3 Encounters:  09/15/17 183 lb (83 kg)  07/11/17 182 lb (82.6 kg)  06/12/17 181 lb 8 oz (82.3 kg)    Current diet: in general, a "healthy" diet    ------------------------------------------------------------------------  Prediabetes, Follow-up:   Lab Results  Component Value Date   HGBA1C 6.0 09/15/2017   HGBA1C 5.9 03/31/2017   HGBA1C 5.9 05/09/2016   GLUCOSE 100 (H) 09/15/2017   GLUCOSE 111 (H) 01/30/2017   GLUCOSE 113 (H) 09/15/2015    Last seen for for this 6 months ago.  Management since that visit includes no changes. Has been eating more sweets and peanut brittle recently  Pertinent Labs:    Component Value Date/Time   CHOL 173 04/01/2017 1330   CHOL 147 09/15/2015 1043   TRIG 103 04/01/2017 1330   CHOLHDL 2.8 04/01/2017 1330   CREATININE 0.99 09/15/2017 1529   CREATININE 1.00 04/25/2012 1258     Follow up for Depression  The patient was last seen for this 6 months ago. Changes made at last visit include increasing Lexapro to 10 mg.  He reports good compliance with treatment. He feels that condition is Improved. He is not having side effects.   States that he continues to have fatigue.  He wants to resume walking, but needs someone to pressure him into it. ("it's not that cold, go.")  ------------------------------------------------------------------------------------ Itching: tried Aquafor Didn't get triamcinolone May have helped when started flax seed oil    Allergies  Allergen Reactions  . Toviaz [Fesoterodine Fumarate Er] Other (See Comments)    Acid reflux     Current Outpatient Medications:  .  Aloe Vera 25 MG CAPS, Take 100 mg by mouth daily. , Disp: , Rfl:  .  amLODipine (NORVASC) 2.5 MG tablet, TAKE 1 TABLET BY MOUTH ONCE DAILY, Disp: 90 tablet, Rfl: 1 .  aspirin 81 MG tablet, Take 81 mg by mouth daily., Disp: , Rfl:  .  escitalopram (LEXAPRO) 10 MG tablet, Take 1 tablet (10 mg total) by mouth at bedtime., Disp: 90 tablet, Rfl: 3 .  hydrochlorothiazide (MICROZIDE) 12.5 MG capsule, TAKE 1 CAPSULE BY MOUTH ONCE DAILY, Disp: 90 capsule, Rfl: 1 .  losartan (COZAAR) 25 MG tablet, TAKE 1 TABLET BY MOUTH ONCE DAILY, Disp: 90 tablet, Rfl: 1 .  naproxen sodium (ANAPROX) 220 MG tablet, Take 440 mg by mouth as needed., Disp: , Rfl:  .  OVER THE COUNTER MEDICATION, Take by mouth 1 day or 1 dose. ESTER C, Disp: , Rfl:  .  OVER THE COUNTER MEDICATION, , Disp: , Rfl:  .  polyethylene glycol powder (GLYCOLAX/MIRALAX) powder, Take 17 g by mouth daily., Disp: 255 g, Rfl: 11 .  tamsulosin (FLOMAX) 0.4 MG CAPS capsule, TAKE ONE CAPSULE BY MOUTH ONCE DAILY, Disp: 90 capsule, Rfl: 3 .  triamcinolone ointment (KENALOG) 0.5 %, Apply 1 application topically 2 (two) times daily., Disp: 60 g, Rfl: 3 .  zinc sulfate 220 (50 Zn) MG capsule, Take 220 mg by mouth daily., Disp: , Rfl:   .  hydrOXYzine (ATARAX/VISTARIL) 10 MG tablet, Take 0.5-1 tablets (5-10 mg total) by mouth at bedtime as needed. (Patient not taking: Reported on 09/15/2017), Disp: 10 tablet, Rfl: 0 .  Multiple Vitamin (MULTIVITAMIN) capsule, Take 1 capsule by mouth daily., Disp: , Rfl:   Review of Systems  Constitutional: Positive for activity change and fatigue. Negative for appetite change, chills, diaphoresis, fever and unexpected weight change.  Cardiovascular: Negative for chest pain, palpitations and leg swelling.  Endocrine: Negative for polydipsia and polyuria.  Psychiatric/Behavioral: Negative for dysphoric mood.    Social History   Tobacco Use  . Smoking status: Former Smoker    Years: 10.00    Last attempt to quit: 07/08/1962    Years since quitting: 55.2  . Smokeless tobacco: Never Used  . Tobacco comment: quit 1964  Substance Use Topics  . Alcohol use: No    Comment: 5 Glasses of Water / Wine   Objective:   BP 132/70 (BP Location: Right Arm, Cuff Size: Normal)   Pulse 73   Temp 97.8 F (36.6 C) (Oral)   Resp 16   Wt 183 lb (83 kg)   SpO2 96%   BMI 27.02 kg/m  Vitals:   09/15/17 1358 09/15/17 1657  BP: (!) 158/82 132/70  Pulse: 73   Resp: 16   Temp: 97.8 F (36.6 C)   TempSrc: Oral   SpO2: 96%   Weight: 183 lb (83 kg)      Physical Exam  Constitutional: He is oriented to person, place, and time. He appears well-developed and well-nourished. No distress.  HENT:  Head: Normocephalic and atraumatic.  Right Ear: External ear normal.  Left Ear: External ear normal.  Nose: Nose normal.  Mouth/Throat: Oropharynx is clear and moist. No oropharyngeal exudate.  Eyes: Conjunctivae are normal. Pupils are equal, round, and reactive to light. No scleral icterus.  Neck: Neck supple. No thyromegaly present.  Cardiovascular: Normal rate, regular rhythm, normal heart sounds and intact distal pulses.  No murmur heard. Pulmonary/Chest: Effort normal and breath sounds normal. No  respiratory distress. He has no wheezes. He has no rales.  Musculoskeletal: He exhibits no edema or deformity.  Lymphadenopathy:    He has no cervical adenopathy.  Neurological: He is alert and oriented to person, place, and time.  Skin: Skin is warm and dry. Rash noted.  Psychiatric: He has a normal mood and affect. His behavior is normal.  Vitals reviewed.   Results for orders placed or performed in visit on 09/15/17  POCT glycosylated hemoglobin (Hb A1C)  Result Value Ref Range   Hemoglobin A1C 6.0    Est. average glucose Bld gHb Est-mCnc 126        Assessment & Plan:   Problem List Items Addressed This Visit      Cardiovascular and Mediastinum   Benign essential HTN - Primary    Initially elevated,  but at goal by end of visit Continue current medications (losartan, HCTZ, amlodipine) Check BMP Follow-up in 6 months      Relevant Orders   Basic Metabolic Panel (BMET) (Completed)     Musculoskeletal and Integument   Eczema    Uncontrolled Advised patient again to resume triamcinolone to affected areas twice daily Continue at least twice daily emollient application diffusely Return precautions discussed        Other   Prediabetes    A1c stable at 6.0 Discussed low-carb diet and exercise Follow-up in 6 months      Relevant Orders   POCT glycosylated hemoglobin (Hb A1C) (Completed)   MDD (major depressive disorder)    Well-controlled, despite recent death of his brother Continue Lexapro at current dose Follow-up in 6 months          Return in about 2 months (around 11/15/2017) for AWV/CPE.   The entirety of the information documented in the History of Present Illness, Review of Systems and Physical Exam were personally obtained by me. Portions of this information were initially documented by Raquel Sarna Ratchford, CMA and reviewed by me for thoroughness and accuracy.    [name], MD, MPH [facility] 09/16/2017 9:10 AM

## 2017-09-15 NOTE — Patient Instructions (Signed)

## 2017-09-16 ENCOUNTER — Telehealth: Payer: Self-pay

## 2017-09-16 LAB — BASIC METABOLIC PANEL
BUN / CREAT RATIO: 28 — AB (ref 10–24)
BUN: 28 mg/dL (ref 10–36)
CO2: 26 mmol/L (ref 20–29)
CREATININE: 0.99 mg/dL (ref 0.76–1.27)
Calcium: 10.7 mg/dL — ABNORMAL HIGH (ref 8.6–10.2)
Chloride: 105 mmol/L (ref 96–106)
GFR, EST AFRICAN AMERICAN: 77 mL/min/{1.73_m2} (ref >59)
GFR, EST NON AFRICAN AMERICAN: 67 mL/min/{1.73_m2} (ref >59)
Glucose: 100 mg/dL — ABNORMAL HIGH (ref 65–99)
Potassium: 4.3 mmol/L (ref 3.5–5.2)
Sodium: 145 mmol/L — ABNORMAL HIGH (ref 134–144)

## 2017-09-16 NOTE — Telephone Encounter (Signed)
Pt's wife advised.

## 2017-09-16 NOTE — Assessment & Plan Note (Signed)
Initially elevated, but at goal by end of visit Continue current medications (losartan, HCTZ, amlodipine) Check BMP Follow-up in 6 months

## 2017-09-16 NOTE — Telephone Encounter (Signed)
-----   Message from [name], MD sent at 09/16/2017  9:25 AM EDT ----- Sodium level is slightly high, which could be related to diet or dehydration.  Calcium level also slightly elevated.  Hold any multivitamin or OTC supplements.  We will recheck at physical.  Normal kidney function  Bacigalupo, Dionne Bucy, MD, MPH [facility] 09/16/2017 9:25 AM

## 2017-09-16 NOTE — Assessment & Plan Note (Signed)
A1c stable at 6.0 Discussed low-carb diet and exercise Follow-up in 6 months

## 2017-09-16 NOTE — Assessment & Plan Note (Signed)
Well-controlled, despite recent death of his brother Continue Lexapro at current dose Follow-up in 6 months

## 2017-09-16 NOTE — Assessment & Plan Note (Signed)
Uncontrolled Advised patient again to resume triamcinolone to affected areas twice daily Continue at least twice daily emollient application diffusely Return precautions discussed

## 2017-09-17 NOTE — Progress Notes (Deleted)
09/18/2017 9:23 PM   [name] Zane Herald Feb 04, 1925 [id number]  Referring provider: [name], MD [address] [place], [place] [address]  No chief complaint on file.   HPI: Patient is an 84 year old Caucasian male who presents today for a 6 month follow up for prostate cancer.  Prostate cancer Patient who underwent cryotherapy for a Gleason's 6 (10 cores + out of 12)-invasion present with a PSA of 4.3 and a 11% free PSA PCa in 2009 with [name] presents today for a 6 month follow up.  He has not had any weight loss, bone pain or appetite loss.  He generally feels well.  He has not had any fevers, chills, nauseas or vomiting.  He has not had any dysuria, gross hematuria or suprapubic pain.  His I PSS score today is 8/2.  His previous I PSS was 8/3.  His major complaints are urgency and nocturia x 2.  His current PSA is 0.6 ng/mL on 05/05/2017, unchanged from 6 months ago.     Score:  1-7 Mild 8-19 Moderate 20-35 Severe    PMH: Past Medical History:  Diagnosis Date  . Arthritis   . Bladder outlet obstruction   . Erectile dysfunction   . GERD (gastroesophageal reflux disease)   . Hard of hearing   . HBP (high blood pressure)   . History of nephrolithiasis   . HLD (hyperlipidemia)   . Incomplete bladder emptying   . Nocturia   . Osteoporosis   . Prostate cancer ([place])   . Shingles 2014  . Sinus problem   . Stomach ulcer   . Stroke ([place]) 2012  . Urinary frequency   . Urinary leakage     Surgical History: Past Surgical History:  Procedure Laterality Date  . CATARACT EXTRACTION  2000, 2001  . CHOLECYSTECTOMY  1972  . PROSTATE SURGERY  2008/2009  . TONSILLECTOMY    . UMBILICAL HERNIA REPAIR      Home Medications:  Allergies as of 09/18/2017      Reactions   Toviaz [fesoterodine Fumarate Er] Other (See Comments)   Acid reflux      Medication List        Accurate as of 09/17/17  9:23 PM. Always use your most recent med list.          Aloe Vera 25 MG Caps Take 100 mg by mouth daily.   amLODipine 2.5 MG tablet Commonly known as:  NORVASC TAKE 1 TABLET BY MOUTH ONCE DAILY   aspirin 81 MG tablet Take 81 mg by mouth daily.   escitalopram 10 MG tablet Commonly known as:  LEXAPRO Take 1 tablet (10 mg total) by mouth at bedtime.   hydrochlorothiazide 12.5 MG capsule Commonly known as:  MICROZIDE TAKE 1 CAPSULE BY MOUTH ONCE DAILY   hydrOXYzine 10 MG tablet Commonly known as:  ATARAX/VISTARIL Take 0.5-1 tablets (5-10 mg total) by mouth at bedtime as needed.   losartan 25 MG tablet Commonly known as:  COZAAR TAKE 1 TABLET BY MOUTH ONCE DAILY   multivitamin capsule Take 1 capsule by mouth daily.   naproxen sodium 220 MG tablet Commonly known as:  ALEVE Take 440 mg by mouth as needed.   OVER THE COUNTER MEDICATION Take by mouth 1 day or 1 dose. ESTER C   OVER THE COUNTER MEDICATION   polyethylene glycol powder powder Commonly known as:  GLYCOLAX/MIRALAX Take 17 g by mouth daily.   tamsulosin 0.4 MG Caps capsule Commonly  known as:  FLOMAX TAKE ONE CAPSULE BY MOUTH ONCE DAILY   triamcinolone ointment 0.5 % Commonly known as:  KENALOG Apply 1 application topically 2 (two) times daily.   zinc sulfate 220 (50 Zn) MG capsule Take 220 mg by mouth daily.       Allergies:  Allergies  Allergen Reactions  . Toviaz [Fesoterodine Fumarate Er] Other (See Comments)    Acid reflux    Family History: Family History  Problem Relation Age of Onset  . Cancer Mother   . Stroke Father   . Stroke Sister   . Prostate cancer Brother   . Kidney disease Neg Hx   . Kidney cancer Neg Hx   . Bladder Cancer Neg Hx     Social History:  reports that he quit smoking about 55 years ago. He quit after 10.00 years of use. he has never used smokeless tobacco. He reports that he does not drink alcohol or use drugs.  ROS:                                        Physical Exam: There were no  vitals taken for this visit.  Constitutional: Well nourished. Alert and oriented, No acute distress. HEENT: [place] AT, moist mucus membranes. Trachea midline, no masses. Cardiovascular: No clubbing, cyanosis, or edema. Respiratory: Normal respiratory effort, no increased work of breathing. GI: Abdomen is soft, non tender, non distended, no abdominal masses. Liver and spleen not palpable.  No hernias appreciated.  Stool sample for occult testing is not indicated.   GU: No CVA tenderness.  No bladder fullness or masses.  Patient with circumcised phallus.  Urethral meatus is patent.  No penile discharge. No penile lesions or rashes. Scrotum without lesions, cysts, rashes and/or edema.  Testicles are located scrotally bilaterally. No masses are appreciated in the testicles. Left and right epididymis are normal. Rectal: Patient with  normal sphincter tone. Anus and perineum without scarring or rashes. No rectal masses are appreciated. Prostate is small, firm, no nodules are appreciated. Seminal vesicles are normal. Skin: No rashes, bruises or suspicious lesions. Lymph: No cervical or inguinal adenopathy. Neurologic: Grossly intact, no focal deficits, moving all 4 extremities. Psychiatric: Normal mood and affect.  Laboratory Data: PSA History  0.6 ng/mL on 04/29/2016  0.6 ng/mL on 11/01/2016  0.6 ng/mL on 05/05/2017  Lab Results  Component Value Date   WBC 4.4 01/30/2017   HGB 14.0 01/30/2017   HCT 40.7 01/30/2017   MCV 88 01/30/2017   PLT 240 01/30/2017    Lab Results  Component Value Date   CREATININE 0.99 09/15/2017    Lab Results  Component Value Date   HGBA1C 6.0 09/15/2017       Component Value Date/Time   CHOL 173 04/01/2017 1330   CHOL 147 09/15/2015 1043   HDL 61 04/01/2017 1330   HDL 59 09/15/2015 1043   CHOLHDL 2.8 04/01/2017 1330   LDLCALC 92 04/01/2017 1330    Lab Results  Component Value Date   AST 30 01/30/2017   Lab Results  Component Value Date   ALT 21  01/30/2017   I have reviewed the labs.  Assessment & Plan:    1. Prostate cancer: Patient underwent cryotherapy for a Gleason's 6 (10 cores + out of 12)-invasion present with a PSA of 4.3 and a 11% free PSA PCa in 2009. His most recent PSA was 0.6  ng/mL on 05/05/2017..   IPSS is stable.  He will return in 6 months for a repeated IPSS score, PSA and exam.    No Follow-up on file.  These notes generated with voice recognition software. I apologize for typographical errors.  Zara Council, [facility] [address] [place], [place] [address] [phone]

## 2017-09-18 ENCOUNTER — Ambulatory Visit: Payer: Medicare Other | Admitting: Urology

## 2017-09-21 NOTE — Progress Notes (Signed)
Patient did not have an exam today, only PTNS

## 2017-09-22 ENCOUNTER — Encounter: Payer: Self-pay | Admitting: Urology

## 2017-09-22 ENCOUNTER — Ambulatory Visit (INDEPENDENT_AMBULATORY_CARE_PROVIDER_SITE_OTHER): Payer: Medicare Other | Admitting: Urology

## 2017-09-22 DIAGNOSIS — C61 Malignant neoplasm of prostate: Secondary | ICD-10-CM | POA: Diagnosis not present

## 2017-09-22 DIAGNOSIS — R350 Frequency of micturition: Secondary | ICD-10-CM

## 2017-09-22 NOTE — Progress Notes (Signed)
PTNS  Session # monthly maint  Health & Social Factors: no change Caffeine: 0 Alcohol: 0 Daytime voids #per day: 8 Night-time voids #per night: 3 Urgency: strong Incontinence Episodes #per day: 0 Ankle used: left Treatment Setting: 19 Feeling/ Response: sensory Comments:   Preformed By: Toniann Fail, LPN

## 2017-10-06 NOTE — Telephone Encounter (Signed)
Rescheduled AWV to 2 and CPE to 240 with Dr B.  -MM

## 2017-10-22 DIAGNOSIS — H40052 Ocular hypertension, left eye: Secondary | ICD-10-CM | POA: Diagnosis not present

## 2017-10-23 ENCOUNTER — Ambulatory Visit (INDEPENDENT_AMBULATORY_CARE_PROVIDER_SITE_OTHER): Payer: Medicare Other

## 2017-10-23 DIAGNOSIS — R350 Frequency of micturition: Secondary | ICD-10-CM | POA: Diagnosis not present

## 2017-10-23 NOTE — Progress Notes (Signed)
PTNS  Session # monthly maint.  Health & Social Factors: change Caffeine: 0 Alcohol: 0 Daytime voids #per day: 7 Night-time voids #per night: 2.5 Urgency: none Incontinence Episodes #per day: 0 Ankle used: left Treatment Setting: 16 Feeling/ Response: toe flex Comments:   Preformed By: Toniann Fail, LPN

## 2017-11-03 ENCOUNTER — Other Ambulatory Visit: Payer: Medicare Other

## 2017-11-05 ENCOUNTER — Ambulatory Visit: Payer: Medicare Other | Admitting: Urology

## 2017-11-06 ENCOUNTER — Other Ambulatory Visit: Payer: Self-pay | Admitting: Physician Assistant

## 2017-11-06 DIAGNOSIS — I10 Essential (primary) hypertension: Secondary | ICD-10-CM

## 2017-11-17 ENCOUNTER — Ambulatory Visit: Payer: Medicare Other

## 2017-11-17 ENCOUNTER — Ambulatory Visit (INDEPENDENT_AMBULATORY_CARE_PROVIDER_SITE_OTHER): Payer: Medicare Other | Admitting: Family Medicine

## 2017-11-17 ENCOUNTER — Ambulatory Visit (INDEPENDENT_AMBULATORY_CARE_PROVIDER_SITE_OTHER): Payer: Medicare Other

## 2017-11-17 ENCOUNTER — Encounter: Payer: Medicare Other | Admitting: Family Medicine

## 2017-11-17 VITALS — BP 150/70 | HR 69 | Temp 98.1°F | Resp 16 | Ht 69.0 in | Wt 179.0 lb

## 2017-11-17 VITALS — BP 148/72 | HR 69 | Temp 98.1°F | Ht 69.0 in | Wt 179.6 lb

## 2017-11-17 DIAGNOSIS — I10 Essential (primary) hypertension: Secondary | ICD-10-CM | POA: Diagnosis not present

## 2017-11-17 DIAGNOSIS — Z0000 Encounter for general adult medical examination without abnormal findings: Secondary | ICD-10-CM | POA: Diagnosis not present

## 2017-11-17 DIAGNOSIS — R7303 Prediabetes: Secondary | ICD-10-CM | POA: Diagnosis not present

## 2017-11-17 DIAGNOSIS — L309 Dermatitis, unspecified: Secondary | ICD-10-CM | POA: Diagnosis not present

## 2017-11-17 DIAGNOSIS — R296 Repeated falls: Secondary | ICD-10-CM

## 2017-11-17 DIAGNOSIS — R252 Cramp and spasm: Secondary | ICD-10-CM | POA: Diagnosis not present

## 2017-11-17 MED ORDER — AMLODIPINE BESYLATE 5 MG PO TABS: 5.0000 mg | ORAL_TABLET | Freq: Every day | ORAL | 3 refills | Status: DC

## 2017-11-17 MED ORDER — TRIAMCINOLONE ACETONIDE 0.5 % EX OINT: 1.0000 "application " | TOPICAL_OINTMENT | Freq: Two times a day (BID) | CUTANEOUS | 3 refills | Status: DC

## 2017-11-17 NOTE — Patient Instructions (Addendum)
Continue hydrocholorthiazide (HCTZ) at 12.66m daily Stop losartan Increase amlodipine to 54mdaily  Call if blood pressure is consistently above 140/90 after 2-4 weeks   Preventive Care 65 Years and Older, Male Preventive care refers to lifestyle choices and visits with your health care provider that can promote health and wellness. What does preventive care include?  A yearly physical exam. This is also called an annual well check.  Dental exams once or twice a year.  Routine eye exams. Ask your health care provider how often you should have your eyes checked.  Personal lifestyle choices, including: ? Daily care of your teeth and gums. ? Regular physical activity. ? Eating a healthy diet. ? Avoiding tobacco and drug use. ? Limiting alcohol use. ? Practicing safe sex. ? Taking low doses of aspirin every day. ? Taking vitamin and mineral supplements as recommended by your health care provider. What happens during an annual well check? The services and screenings done by your health care provider during your annual well check will depend on your age, overall health, lifestyle risk factors, and family history of disease. Counseling Your health care provider may ask you questions about your:  Alcohol use.  Tobacco use.  Drug use.  Emotional well-being.  Home and relationship well-being.  Sexual activity.  Eating habits.  History of falls.  Memory and ability to understand (cognition).  Work and work enStatistician Screening You may have the following tests or measurements:  Height, weight, and BMI.  Blood pressure.  Lipid and cholesterol levels. These may be checked every 5 years, or more frequently if you are over 5041ears old.  Skin check.  Lung cancer screening. You may have this screening every year starting at age 3756f you have a 30-pack-year history of smoking and currently smoke or have quit within the past 15 years.  Fecal occult blood test (FOBT)  of the stool. You may have this test every year starting at age 472 Flexible sigmoidoscopy or colonoscopy. You may have a sigmoidoscopy every 5 years or a colonoscopy every 10 years starting at age 82 Prostate cancer screening. Recommendations will vary depending on your family history and other risks.  Hepatitis C blood test.  Hepatitis B blood test.  Sexually transmitted disease (STD) testing.  Diabetes screening. This is done by checking your blood sugar (glucose) after you have not eaten for a while (fasting). You may have this done every 1-3 years.  Abdominal aortic aneurysm (AAA) screening. You may need this if you are a current or former smoker.  Osteoporosis. You may be screened starting at age 61f you are at high risk.  Talk with your health care provider about your test results, treatment options, and if necessary, the need for more tests. Vaccines Your health care provider may recommend certain vaccines, such as:  Influenza vaccine. This is recommended every year.  Tetanus, diphtheria, and acellular pertussis (Tdap, Td) vaccine. You may need a Td booster every 10 years.  Varicella vaccine. You may need this if you have not been vaccinated.  Zoster vaccine. You may need this after age 82 Measles, mumps, and rubella (MMR) vaccine. You may need at least one dose of MMR if you were born in 1957 or later. You may also need a second dose.  Pneumococcal 13-valent conjugate (PCV13) vaccine. One dose is recommended after age 82 Pneumococcal polysaccharide (PPSV23) vaccine. One dose is recommended after age 82 Meningococcal vaccine. You may need this if  you have certain conditions.  Hepatitis A vaccine. You may need this if you have certain conditions or if you travel or work in places where you may be exposed to hepatitis A.  Hepatitis B vaccine. You may need this if you have certain conditions or if you travel or work in places where you may be exposed to  hepatitis B.  Haemophilus influenzae type b (Hib) vaccine. You may need this if you have certain risk factors.  Talk to your health care provider about which screenings and vaccines you need and how often you need them. This information is not intended to replace advice given to you by your health care provider. Make sure you discuss any questions you have with your health care provider. Document Released: 07/21/2015 Document Revised: 03/13/2016 Document Reviewed: 04/25/2015 Elsevier Interactive Patient Education  [employer].

## 2017-11-17 NOTE — Progress Notes (Signed)
Subjective:   [name] is a 84 y.o. male who presents for Medicare Annual/Subsequent preventive examination.  Review of Systems:  N/A  Cardiac Risk Factors include: advanced age (>6men, >60 women);dyslipidemia;hypertension;male gender     Objective:    Vitals: BP (!) 148/72 (BP Location: Right Arm)   Pulse 69   Temp 98.1 F (36.7 C) (Oral)   Ht 5\' 9"  (1.753 m)   Wt 179 lb 9.6 oz (81.5 kg)   BMI 26.52 kg/m   Body mass index is 26.52 kg/m.  Advanced Directives 11/17/2017 09/13/2016 12/15/2015 01/05/2015  Does Patient Have a Medical Advance Directive? No No No Yes  Type of Advance Directive - - - Living will  Does patient want to make changes to medical advance directive? - - - No - Patient declined  Would patient like information on creating a medical advance directive? Yes (MAU/Ambulatory/Procedural Areas - Information given) Yes (ED - Information included in AVS) - -    Tobacco Social History   Tobacco Use  Smoking Status Former Smoker  . Years: 10.00  . Last attempt to quit: 07/08/1962  . Years since quitting: 55.4  Smokeless Tobacco Never Used  Tobacco Comment   quit 1964     Counseling given: Not Answered Comment: quit 1964   Clinical Intake:  Pre-visit preparation completed: Yes  Pain : No/denies pain Pain Score: 0-No pain     Nutritional Status: BMI 25 -29 Overweight Nutritional Risks: None Diabetes: No  How often do you need to have someone help you when you read instructions, pamphlets, or other written materials from your doctor or pharmacy?: 1 - Never  Interpreter Needed?: No  Information entered by :: [employer], LPN  Past Medical History:  Diagnosis Date  . Arthritis   . Bladder outlet obstruction   . Erectile dysfunction   . GERD (gastroesophageal reflux disease)   . Hard of hearing   . HBP (high blood pressure)   . History of nephrolithiasis   . HLD (hyperlipidemia)   . Incomplete bladder emptying   . Nocturia   .  Osteoporosis   . Prostate cancer ([place])   . Shingles 2014  . Sinus problem   . Stomach ulcer   . Stroke ([place]) 2012  . Urinary frequency   . Urinary leakage    Past Surgical History:  Procedure Laterality Date  . CATARACT EXTRACTION  2000, 2001  . CHOLECYSTECTOMY  1972  . PROSTATE SURGERY  2008/2009  . TONSILLECTOMY    . UMBILICAL HERNIA REPAIR     Family History  Problem Relation Age of Onset  . Cancer Mother   . Stroke Father   . Stroke Sister   . Prostate cancer Brother   . Kidney disease Neg Hx   . Kidney cancer Neg Hx   . Bladder Cancer Neg Hx    Social History   Socioeconomic History  . Marital status: Married    Spouse name: Not on file  . Number of children: 2  . Years of education: Not on file  . Highest education level: Master's degree (e.g., MA, MS, MEng, MEd, MSW, MBA)  Occupational History  . Occupation: retired  Scientific laboratory technician  . Financial resource strain: Not hard at all  . Food insecurity:    Worry: Never true    Inability: Never true  . Transportation needs:    Medical: No    Non-medical: No  Tobacco Use  . Smoking status: Former Smoker    Years: 10.00  Last attempt to quit: 07/08/1962    Years since quitting: 55.4  . Smokeless tobacco: Never Used  . Tobacco comment: quit 1964  Substance and Sexual Activity  . Alcohol use: No  . Drug use: No  . Sexual activity: Not on file  Lifestyle  . Physical activity:    Days per week: Not on file    Minutes per session: Not on file  . Stress: Not at all  Relationships  . Social connections:    Talks on phone: Not on file    Gets together: Not on file    Attends religious service: Not on file    Active member of club or organization: Not on file    Attends meetings of clubs or organizations: Not on file    Relationship status: Not on file  Other Topics Concern  . Not on file  Social History Narrative  . Not on file    Outpatient Encounter Medications as of 11/17/2017  Medication Sig  .  amLODipine (NORVASC) 2.5 MG tablet TAKE 1 TABLET BY MOUTH ONCE DAILY  . aspirin 81 MG tablet Take 81 mg by mouth daily.  . Emollient (CERAVE) CREA Apply topically.  [name] escitalopram (LEXAPRO) 10 MG tablet Take 1 tablet (10 mg total) by mouth at bedtime.  . Flaxseed, Linseed, (FLAX SEED OIL PO) Take by mouth daily.  . hydrochlorothiazide (MICROZIDE) 12.5 MG capsule TAKE 1 CAPSULE BY MOUTH ONCE DAILY  . losartan (COZAAR) 25 MG tablet TAKE 1 TABLET BY MOUTH ONCE DAILY  . mineral oil-hydrophilic petrolatum (AQUAPHOR) ointment Apply topically as needed for dry skin.  . Multiple Vitamin (MULTIVITAMIN) capsule Take 1 capsule by mouth daily.  . naproxen sodium (ANAPROX) 220 MG tablet Take 440 mg by mouth as needed.  [name] OVER THE COUNTER MEDICATION Take 1 tablet by mouth 2 (two) times daily. ESTER C  . OVER THE COUNTER MEDICATION   . polyethylene glycol powder (GLYCOLAX/MIRALAX) powder Take 17 g by mouth daily. (Patient taking differently: Take 17 g by mouth daily. )  . tamsulosin (FLOMAX) 0.4 MG CAPS capsule TAKE ONE CAPSULE BY MOUTH ONCE DAILY  . zinc sulfate 220 (50 Zn) MG capsule Take by mouth daily.   . hydrOXYzine (ATARAX/VISTARIL) 10 MG tablet Take 0.5-1 tablets (5-10 mg total) by mouth at bedtime as needed. (Patient not taking: Reported on 09/15/2017)  . triamcinolone ointment (KENALOG) 0.5 % Apply 1 application topically 2 (two) times daily. (Patient not taking: Reported on 11/17/2017)  . [DISCONTINUED] Aloe Vera 25 MG CAPS Take 100 mg by mouth daily.    No facility-administered encounter medications on file as of 11/17/2017.     Activities of Daily Living In your present state of health, do you have any difficulty performing the following activities: 11/17/2017  Hearing? Y  Comment Wears a hearing aid in the left ear and has an amplifier in the right ear.   Vision? N  Difficulty concentrating or making decisions? N  Walking or climbing stairs? Y  Comment Due to fatigue.  Dressing or bathing? N   Doing errands, shopping? Y  Comment Does no drive.  Preparing Food and eating ? N  Using the Toilet? N  In the past six months, have you accidently leaked urine? Y  Comment Occasionally, wears protection.   Do you have problems with loss of bowel control? N  Managing your Medications? N  Managing your Finances? N  Housekeeping or managing your Housekeeping? N  Some recent data might be hidden  Patient Care Team: [name], MD as PCP - General (Family Medicine) Edrick Kins, DPM as Consulting Physician (Podiatry) Ernestine Conrad, Gordan Payment as Physician Assistant (Urology) Birder Robson, MD as Referring Physician (Ophthalmology) Arelia Sneddon, OD as Consulting Physician (Optometry)   Assessment:   This is a routine wellness examination for [employer].  Exercise Activities and Dietary recommendations Current Exercise Habits: Home exercise routine, Type of exercise: stretching(ROM exercises from PT), Time (Minutes): 20, Frequency (Times/Week): 3, Weekly Exercise (Minutes/Week): 60, Intensity: Mild, Exercise limited by: orthopedic condition(s)  Goals    . Exercise 150 minutes per week (moderate activity)     Recommend more exercise. Pt to start walking daily in Spring 2018 for at least a mile.    [name] HEMOGLOBIN A1C < 7.0       Fall Risk Fall Risk  11/17/2017 09/13/2016 09/14/2015 03/15/2015 12/22/2014  Falls in the past year? Yes Yes No Yes No  Number falls in past yr: 2 or more 1 - 1 -  Comment - slipped, reviewed by Dr Venia Minks following incident  - - -  Injury with Fall? Yes Yes - No -  Comment fractured 3 bones broken rips and hurt back - - -  Risk for fall due to : - - - Impaired balance/gait;Impaired mobility Impaired balance/gait  Follow up Falls prevention discussed Falls prevention discussed - Education provided;Falls prevention discussed;Falls evaluation completed -  Comment - - - Got up too quicdly without his cane. Will not do again.  -   Is the patient's  home free of loose throw rugs in walkways, pet beds, electrical cords, etc?   yes      Grab bars in the bathroom? yes      Handrails on the stairs?   yes      Adequate lighting?   yes  Timed Get Up and Go Performed: N/A  Depression Screen PHQ 2/9 Scores 11/17/2017 09/15/2017 09/13/2016 01/04/2016  PHQ - 2 Score 1 4 1 3   PHQ- 9 Score - 10 - 5    Cognitive Function: Pt declined screening today.     6CIT Screen 09/13/2016  What Year? 0 points  What month? 0 points  What time? 0 points  Count back from 20 0 points  Months in reverse 0 points  Repeat phrase 0 points  Total Score 0    Immunization History  Administered Date(s) Administered  . Influenza Split 05/15/2009, 05/08/2011  . Influenza, High Dose Seasonal PF 04/13/2014  . Influenza,inj,Quad PF,6+ Mos 04/07/2013  . Pneumococcal Conjugate-13 04/13/2014  . Pneumococcal Polysaccharide-23 06/09/2012  . Td 01/19/2007  . Tdap 12/04/2010, 08/19/2016  . Zoster 04/20/2007    Qualifies for Shingles Vaccine? Due for Shingles vaccine. Declined my offer to administer today. Education has been provided regarding the importance of this vaccine. Pt has been advised to call her insurance company to determine her out of pocket expense. Advised she may also receive this vaccine at her local pharmacy or Health Dept. Verbalized acceptance and understanding.  Screening Tests Health Maintenance  Topic Date Due  . INFLUENZA VACCINE  02/05/2018  . TETANUS/TDAP  08/19/2026  . PNA vac Low Risk Adult  Completed   Cancer Screenings: Lung: Low Dose CT Chest recommended if Age 42-80 years, 30 pack-year currently smoking OR have quit w/in 15years. Patient does not qualify. Colorectal: Up to date  Additional Screenings:  Hepatitis C Screening: N/A      Plan:  I have personally reviewed and addressed the Medicare Annual Wellness  questionnaire and have noted the following in the patient's chart:  A. Medical and social history B. Use of alcohol,  tobacco or illicit drugs  C. Current medications and supplements D. Functional ability and status E.  Nutritional status F.  Physical activity G. Advance directives H. List of other physicians I.  Hospitalizations, surgeries, and ER visits in previous 12 months J.  [place] such as hearing and vision if needed, cognitive and depression L. Referrals and appointments - none  In addition, I have reviewed and discussed with patient certain preventive protocols, quality metrics, and best practice recommendations. A written personalized care plan for preventive services as well as general preventive health recommendations were provided to patient.  See attached scanned questionnaire for additional information.   Signed,  Fabio Neighbors, LPN Nurse Health Advisor   Nurse Recommendations: None.

## 2017-11-17 NOTE — Progress Notes (Signed)
Patient: [name], Male    DOB: 1925/03/02, 84 y.o.   MRN: [id number] Visit Date: 11/17/2017  Today's Provider: Lavon Paganini, MD   I, Martha Clan, CMA, am acting as scribe for Lavon Paganini, MD.  Chief Complaint  Patient presents with  . Annual Exam   Subjective:     Complete Physical [name] is a 84 y.o. male. He feels fairly well. He is still c/o pruritis. His wife is questioning if this could be a side effect from a medication (escitalopram). He has a H/O eczema, and states he does not know if the itching is coming from eczema. He is using [employer], with relief. He is not taking hydroxyzine or triamcinolone cream. He reports exercising some, but is not exercising his legs. He reports he is sleeping poorly.  His BP is elevated, but states his losartan was recalled due to "impurities".   He is also experiencing leg cramps. He states the cramps are worse in the mornings. He has used mustard for this, with relief. The cramping is worse in the left leg.  States that he is seeing Dermatology next month.  He is concerned about eczema -----------------------------------------------------------   Review of Systems  Constitutional: Positive for fatigue. Negative for activity change, appetite change, chills, diaphoresis, fever and unexpected weight change.  HENT: Positive for drooling, hearing loss, postnasal drip, rhinorrhea and sneezing. Negative for congestion, dental problem, ear discharge, ear pain, facial swelling, mouth sores, nosebleeds, sinus pressure, sinus pain, sore throat, tinnitus, trouble swallowing and voice change.   Eyes: Negative.   Respiratory: Positive for cough. Negative for apnea, choking, chest tightness, shortness of breath, wheezing and stridor.   Cardiovascular: Negative.   Gastrointestinal: Positive for constipation. Negative for abdominal distention, abdominal pain, anal bleeding, blood in stool, diarrhea, nausea,  rectal pain and vomiting.  Endocrine: Positive for polyuria. Negative for cold intolerance, heat intolerance, polydipsia and polyphagia.  Genitourinary: Positive for urgency. Negative for decreased urine volume, difficulty urinating, discharge, dysuria, enuresis, flank pain, frequency, genital sores, hematuria, penile pain, penile swelling, scrotal swelling and testicular pain.  Musculoskeletal: Positive for myalgias. Negative for arthralgias, back pain, gait problem, joint swelling, neck pain and neck stiffness.  Skin: Positive for rash. Negative for color change, pallor and wound.  Hematological: Negative for adenopathy. Bruises/bleeds easily.  Psychiatric/Behavioral: Negative.     Social History   Socioeconomic History  . Marital status: Married    Spouse name: Not on file  . Number of children: 2  . Years of education: Not on file  . Highest education level: Master's degree (e.g., MA, MS, MEng, MEd, MSW, MBA)  Occupational History  . Occupation: retired  Scientific laboratory technician  . Financial resource strain: Not hard at all  . Food insecurity:    Worry: Never true    Inability: Never true  . Transportation needs:    Medical: No    Non-medical: No  Tobacco Use  . Smoking status: Former Smoker    Years: 10.00    Last attempt to quit: 07/08/1962    Years since quitting: 55.4  . Smokeless tobacco: Never Used  . Tobacco comment: quit 1964  Substance and Sexual Activity  . Alcohol use: No  . Drug use: No  . Sexual activity: Not on file  Lifestyle  . Physical activity:    Days per week: Not on file    Minutes per session: Not on file  . Stress: Not at all  Relationships  .  Social connections:    Talks on phone: Not on file    Gets together: Not on file    Attends religious service: Not on file    Active member of club or organization: Not on file    Attends meetings of clubs or organizations: Not on file    Relationship status: Not on file  . Intimate partner violence:    Fear of  current or ex partner: Not on file    Emotionally abused: Not on file    Physically abused: Not on file    Forced sexual activity: Not on file  Other Topics Concern  . Not on file  Social History Narrative  . Not on file    Past Medical History:  Diagnosis Date  . Arthritis   . Bladder outlet obstruction   . Erectile dysfunction   . GERD (gastroesophageal reflux disease)   . Hard of hearing   . HBP (high blood pressure)   . History of nephrolithiasis   . HLD (hyperlipidemia)   . Incomplete bladder emptying   . Nocturia   . Osteoporosis   . Prostate cancer ([place])   . Shingles 2014  . Sinus problem   . Stomach ulcer   . Stroke ([place]) 2012  . Urinary frequency   . Urinary leakage      Patient Active Problem List   Diagnosis Date Noted  . Frequent falls 04/01/2017  . MDD (major depressive disorder) 04/01/2017  . Constipation 03/31/2017  . Frozen shoulder 02/12/2017  . Eczema 02/12/2017  . Left leg pain 02/12/2017  . Urinary frequency 09/18/2015  . Allergic rhinitis 06/27/2015  . Cerebral infarction ([place]) 06/27/2015  . DD (diverticular disease) 06/27/2015  . Benign essential HTN 06/27/2015  . Hypercholesterolemia 06/27/2015  . Fungal infection of nail 06/27/2015  . Foot pain, right 04/10/2015  . Prediabetes 03/15/2015  . Prostate cancer ([place]) 12/18/2014  . Arthritis, degenerative 01/12/2014  . H/O malignant neoplasm of prostate 12/03/2012  . Urge incontinence 11/09/2012  . Obstruction of urinary tract 11/09/2012  . Dermatophytosis of groin 09/29/2012  . 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 Incomplete bladder emptying 09/29/2012    Past Surgical History:  Procedure Laterality Date  . CATARACT EXTRACTION  2000, 2001  . CHOLECYSTECTOMY  1972  . PROSTATE SURGERY  2008/2009  . TONSILLECTOMY    . UMBILICAL HERNIA REPAIR      His family history includes Cancer in his mother; Prostate cancer in his brother; Stroke in his father and sister.  There is no history of Kidney disease, Kidney cancer, or Bladder Cancer.      Current Outpatient Medications:  .  amLODipine (NORVASC) 2.5 MG tablet, TAKE 1 TABLET BY MOUTH ONCE DAILY, Disp: 90 tablet, Rfl: 1 .  aspirin 81 MG tablet, Take 81 mg by mouth daily., Disp: , Rfl:  .  Emollient (CERAVE) CREA, Apply topically., Disp: , Rfl:  .  escitalopram (LEXAPRO) 10 MG tablet, Take 1 tablet (10 mg total) by mouth at bedtime., Disp: 90 tablet, Rfl: 3 .  Flaxseed, Linseed, (FLAX SEED OIL PO), Take by mouth daily., Disp: , Rfl:  .  hydrochlorothiazide (MICROZIDE) 12.5 MG capsule, TAKE 1 CAPSULE BY MOUTH ONCE DAILY, Disp: 90 capsule, Rfl: 3 .  mineral oil-hydrophilic petrolatum (AQUAPHOR) ointment, Apply topically as needed for dry skin., Disp: , Rfl:  .  Multiple Vitamin (MULTIVITAMIN) capsule, Take 1 capsule by mouth daily., Disp: , Rfl:  .  naproxen sodium (ANAPROX) 220 MG tablet, Take  440 mg by mouth as needed., Disp: , Rfl:  .  OVER THE COUNTER MEDICATION, Take 1 tablet by mouth 2 (two) times daily. ESTER C, Disp: , Rfl:  .  OVER THE COUNTER MEDICATION, , Disp: , Rfl:  .  polyethylene glycol powder (GLYCOLAX/MIRALAX) powder, Take 17 g by mouth daily. (Patient taking differently: Take 17 g by mouth daily. ), Disp: 255 g, Rfl: 11 .  tamsulosin (FLOMAX) 0.4 MG CAPS capsule, TAKE ONE CAPSULE BY MOUTH ONCE DAILY, Disp: 90 capsule, Rfl: 3 .  zinc sulfate 220 (50 Zn) MG capsule, Take by mouth daily. , Disp: , Rfl:  .  hydrOXYzine (ATARAX/VISTARIL) 10 MG tablet, Take 0.5-1 tablets (5-10 mg total) by mouth at bedtime as needed. (Patient not taking: Reported on 09/15/2017), Disp: 10 tablet, Rfl: 0 .  losartan (COZAAR) 25 MG tablet, TAKE 1 TABLET BY MOUTH ONCE DAILY (Patient not taking: Reported on 11/17/2017), Disp: 90 tablet, Rfl: 1 .  triamcinolone ointment (KENALOG) 0.5 %, Apply 1 application topically 2 (two) times daily. (Patient not taking: Reported on 11/17/2017), Disp: 60 g, Rfl: 3  Patient Care  Team: Bacigalupo, Dionne Bucy, MD as PCP - General (Family Medicine) Edrick Kins, DPM as Consulting Physician (Podiatry) Ernestine Conrad, Gordan Payment as Physician Assistant (Urology) Birder Robson, MD as Referring Physician (Ophthalmology) Arelia Sneddon, OD as Consulting Physician (Optometry) Oneta Rack, MD as Consulting Physician (Dermatology)     Objective:   Vitals: BP (!) 150/70 (BP Location: Left Arm, Patient Position: Sitting, Cuff Size: Normal)   Pulse 69   Temp 98.1 F (36.7 C) (Oral)   Resp 16   Ht 5\' 9"  (1.753 m)   Wt 179 lb (81.2 kg)   BMI 26.43 kg/m   Physical Exam  Constitutional: He is oriented to person, place, and time. He appears well-developed and well-nourished. No distress.  HENT:  Head: Normocephalic and atraumatic.  Right Ear: External ear normal.  Left Ear: External ear normal.  Nose: Nose normal.  Mouth/Throat: Oropharynx is clear and moist.  Eyes: Pupils are equal, round, and reactive to light. Conjunctivae and EOM are normal. No scleral icterus.  Neck: Neck supple. No thyromegaly present.  Cardiovascular: Normal rate, regular rhythm, normal heart sounds and intact distal pulses.  No murmur heard. Pulmonary/Chest: Effort normal and breath sounds normal. No respiratory distress. He has no wheezes. He has no rales.  Abdominal: Soft. Bowel sounds are normal. He exhibits no distension. There is no tenderness. There is no rebound and no guarding.  Musculoskeletal: He exhibits no edema or deformity.  Lymphadenopathy:    He has no cervical adenopathy.  Neurological: He is alert and oriented to person, place, and time.  Skin: Skin is warm and dry. Capillary refill takes less than 2 seconds. Rash (dry, erythematous rash) noted.  Psychiatric: He has a normal mood and affect. His behavior is normal.  Vitals reviewed.   Activities of Daily Living In your present state of health, do you have any difficulty performing the following activities:  11/17/2017  Hearing? Y  Comment Wears a hearing aid in the left ear and has an amplifier in the right ear.   Vision? N  Difficulty concentrating or making decisions? N  Walking or climbing stairs? Y  Comment Due to fatigue.  Dressing or bathing? N  Doing errands, shopping? Y  Comment Does no drive.  Preparing Food and eating ? N  Using the Toilet? N  In the past six months, have you accidently leaked urine?  Y  Comment Occasionally, wears protection.   Do you have problems with loss of bowel control? N  Managing your Medications? N  Managing your Finances? N  Housekeeping or managing your Housekeeping? N  Some recent data might be hidden    Fall Risk Assessment Fall Risk  11/17/2017 09/13/2016 09/14/2015 03/15/2015 12/22/2014  Falls in the past year? Yes Yes No Yes No  Number falls in past yr: 2 or more 1 - 1 -  Comment - slipped, reviewed by Dr Venia Minks following incident  - - -  Injury with Fall? Yes Yes - No -  Comment fractured 3 bones broken rips and hurt back - - -  Risk for fall due to : - - - Impaired balance/gait;Impaired mobility Impaired balance/gait  Follow up Falls prevention discussed Falls prevention discussed - Education provided;Falls prevention discussed;Falls evaluation completed -  Comment - - - Got up too quicdly without his cane. Will not do again.  -     Depression Screen PHQ 2/9 Scores 11/17/2017 09/15/2017 09/13/2016 01/04/2016  PHQ - 2 Score 1 4 1 3   PHQ- 9 Score - 10 - 5    Pt declined his 6CIT screening at today's annual Medicare Wellness Visit.   Assessment & Plan:    Annual Physical Reviewed patient's Family Medical History Reviewed and updated list of patient's medical providers Assessment of cognitive impairment was done Assessed patient's functional ability Established a written schedule for health screening [place] Completed and Reviewed  Exercise Activities and Dietary recommendations Goals    . Exercise 150 minutes per  week (moderate activity)     Recommend more exercise. Pt to start walking daily in Spring 2018 for at least a mile.    [name] HEMOGLOBIN A1C < 7.0       Immunization History  Administered Date(s) Administered  . Influenza Split 05/15/2009, 05/08/2011  . Influenza, High Dose Seasonal PF 04/13/2014  . Influenza,inj,Quad PF,6+ Mos 04/07/2013  . Pneumococcal Conjugate-13 04/13/2014  . Pneumococcal Polysaccharide-23 06/09/2012  . Td 01/19/2007  . Tdap 12/04/2010, 08/19/2016  . Zoster 04/20/2007    Health Maintenance  Topic Date Due  . INFLUENZA VACCINE  02/05/2018  . TETANUS/TDAP  08/19/2026  . PNA vac Low Risk Adult  Completed     Discussed health benefits of physical activity, and encouraged him to engage in regular exercise appropriate for his age and condition.    ------------------------------------------------------------------------------------------------------------ Problem List Items Addressed This Visit      Cardiovascular and Mediastinum   Benign essential HTN    Elevated today, but had stopped losartan due to possible recall Can d/c losartan and increase amlodipine to compensate Increase amlodipine to 5mg  daily Call back with BP readings and consider increase to 10mg  daily Continue HCTZ at current dose      Relevant Medications   amLODipine (NORVASC) 5 MG tablet   Other Relevant Orders   Basic Metabolic Panel (BMET) (Completed)     Musculoskeletal and Integument   Eczema    Uncontrolled Resume triamcinolone Continue emollient BID Follow with Derm as scheduled      Relevant Medications   triamcinolone ointment (KENALOG) 0.5 %     Other   Prediabetes    A1c previously well controlled F/u at next visit      Frequent falls    Discussed safety measures Resume PT      Relevant Orders   Ambulatory referral to Physical Therapy    Other Visit Diagnoses    Encounter for  annual physical exam    -  Primary   Leg cramps       Relevant Orders   Basic  Metabolic Panel (BMET) (Completed)   Magnesium (Completed)   Hypercalcemia       Relevant Orders   Basic Metabolic Panel (BMET) (Completed)       Return in about 6 months (around 05/20/2018) for chronic disease f/u.   The entirety of the information documented in the History of Present Illness, Review of Systems and Physical Exam were personally obtained by me. Portions of this information were initially documented by Raquel Sarna Ratchford, CMA and reviewed by me for thoroughness and accuracy.    [name], MD, MPH [facility] 11/18/2017 9:52 AM

## 2017-11-17 NOTE — Assessment & Plan Note (Addendum)
Elevated today, but had stopped losartan due to possible recall Can d/c losartan and increase amlodipine to compensate Increase amlodipine to 5mg  daily Call back with BP readings and consider increase to 10mg  daily Continue HCTZ at current dose

## 2017-11-17 NOTE — Patient Instructions (Addendum)
[name] , Thank you for taking time to come for your Medicare Wellness Visit. I appreciate your ongoing commitment to your health goals. Please review the following plan we discussed and let me know if I can assist you in the future.   Screening recommendations/referrals: Colonoscopy: N/A Recommended yearly ophthalmology/optometry visit for glaucoma screening and checkup Recommended yearly dental visit for hygiene and checkup  Vaccinations: Influenza vaccine: N/A Pneumococcal vaccine: Up to date Tdap vaccine: Up to date Shingles vaccine: Pt declines today.     Advanced directives: Advance directive discussed with you today. I have provided a copy for you to complete at home and have notarized. Once this is complete please bring a copy in to our office so we can scan it into your chart.  Conditions/risks identified: Exercise- recommend to continue to work on previous goal of walking a mile a couple times a week.   Next appointment: 2:40 pm today with Dr Brita Romp.   Preventive Care 52 Years and Older, Male Preventive care refers to lifestyle choices and visits with your health care provider that can promote health and wellness. What does preventive care include?  A yearly physical exam. This is also called an annual well check.  Dental exams once or twice a year.  Routine eye exams. Ask your health care provider how often you should have your eyes checked.  Personal lifestyle choices, including:  Daily care of your teeth and gums.  Regular physical activity.  Eating a healthy diet.  Avoiding tobacco and drug use.  Limiting alcohol use.  Practicing safe sex.  Taking low doses of aspirin every day.  Taking vitamin and mineral supplements as recommended by your health care provider. What happens during an annual well check? The services and screenings done by your health care provider during your annual well check will depend on your age, overall health, lifestyle  risk factors, and family history of disease. Counseling  Your health care provider may ask you questions about your:  Alcohol use.  Tobacco use.  Drug use.  Emotional well-being.  Home and relationship well-being.  Sexual activity.  Eating habits.  History of falls.  Memory and ability to understand (cognition).  Work and work Statistician. Screening  You may have the following tests or measurements:  Height, weight, and BMI.  Blood pressure.  Lipid and cholesterol levels. These may be checked every 5 years, or more frequently if you are over 75 years old.  Skin check.  Lung cancer screening. You may have this screening every year starting at age 54 if you have a 30-pack-year history of smoking and currently smoke or have quit within the past 15 years.  Fecal occult blood test (FOBT) of the stool. You may have this test every year starting at age 63.  Flexible sigmoidoscopy or colonoscopy. You may have a sigmoidoscopy every 5 years or a colonoscopy every 10 years starting at age 24.  Prostate cancer screening. Recommendations will vary depending on your family history and other risks.  Hepatitis C blood test.  Hepatitis B blood test.  Sexually transmitted disease (STD) testing.  Diabetes screening. This is done by checking your blood sugar (glucose) after you have not eaten for a while (fasting). You may have this done every 1-3 years.  Abdominal aortic aneurysm (AAA) screening. You may need this if you are a current or former smoker.  Osteoporosis. You may be screened starting at age 6 if you are at high risk. Talk with your health care provider  about your test results, treatment options, and if necessary, the need for more tests. Vaccines  Your health care provider may recommend certain vaccines, such as:  Influenza vaccine. This is recommended every year.  Tetanus, diphtheria, and acellular pertussis (Tdap, Td) vaccine. You may need a Td booster every 10  years.  Zoster vaccine. You may need this after age 73.  Pneumococcal 13-valent conjugate (PCV13) vaccine. One dose is recommended after age 51.  Pneumococcal polysaccharide (PPSV23) vaccine. One dose is recommended after age 53. Talk to your health care provider about which screenings and vaccines you need and how often you need them. This information is not intended to replace advice given to you by your health care provider. Make sure you discuss any questions you have with your health care provider. Document Released: 07/21/2015 Document Revised: 03/13/2016 Document Reviewed: 04/25/2015 Elsevier Interactive Patient Education  2017 [place] Prevention in the Home Falls can cause injuries. They can happen to people of all ages. There are many things you can do to make your home safe and to help prevent falls. What can I do on the outside of my home?  Regularly fix the edges of walkways and driveways and fix any cracks.  Remove anything that might make you trip as you walk through a door, such as a raised step or threshold.  Trim any bushes or trees on the path to your home.  Use bright outdoor lighting.  Clear any walking paths of anything that might make someone trip, such as rocks or tools.  Regularly check to see if handrails are loose or broken. Make sure that both sides of any steps have handrails.  Any raised decks and porches should have guardrails on the edges.  Have any leaves, snow, or ice cleared regularly.  Use sand or salt on walking paths during winter.  Clean up any spills in your garage right away. This includes oil or grease spills. What can I do in the bathroom?  Use night lights.  Install grab bars by the toilet and in the tub and shower. Do not use towel bars as grab bars.  Use non-skid mats or decals in the tub or shower.  If you need to sit down in the shower, use a plastic, non-slip stool.  Keep the floor dry. Clean up any water that  spills on the floor as soon as it happens.  Remove soap buildup in the tub or shower regularly.  Attach bath mats securely with double-sided non-slip rug tape.  Do not have throw rugs and other things on the floor that can make you trip. What can I do in the bedroom?  Use night lights.  Make sure that you have a light by your bed that is easy to reach.  Do not use any sheets or blankets that are too big for your bed. They should not hang down onto the floor.  Have a firm chair that has side arms. You can use this for support while you get dressed.  Do not have throw rugs and other things on the floor that can make you trip. What can I do in the kitchen?  Clean up any spills right away.  Avoid walking on wet floors.  Keep items that you use a lot in easy-to-reach places.  If you need to reach something above you, use a strong step stool that has a grab bar.  Keep electrical cords out of the way.  Do not use floor polish or  wax that makes floors slippery. If you must use wax, use non-skid floor wax.  Do not have throw rugs and other things on the floor that can make you trip. What can I do with my stairs?  Do not leave any items on the stairs.  Make sure that there are handrails on both sides of the stairs and use them. Fix handrails that are broken or loose. Make sure that handrails are as long as the stairways.  Check any carpeting to make sure that it is firmly attached to the stairs. Fix any carpet that is loose or worn.  Avoid having throw rugs at the top or bottom of the stairs. If you do have throw rugs, attach them to the floor with carpet tape.  Make sure that you have a light switch at the top of the stairs and the bottom of the stairs. If you do not have them, ask someone to add them for you. What else can I do to help prevent falls?  Wear shoes that:  Do not have high heels.  Have rubber bottoms.  Are comfortable and fit you well.  Are closed at the  toe. Do not wear sandals.  If you use a stepladder:  Make sure that it is fully opened. Do not climb a closed stepladder.  Make sure that both sides of the stepladder are locked into place.  Ask someone to hold it for you, if possible.  Clearly mark and make sure that you can see:  Any grab bars or handrails.  First and last steps.  Where the edge of each step is.  Use tools that help you move around (mobility aids) if they are needed. These include:  Canes.  Walkers.  Scooters.  Crutches.  Turn on the lights when you go into a dark area. Replace any light bulbs as soon as they burn out.  Set up your furniture so you have a clear path. Avoid moving your furniture around.  If any of your floors are uneven, fix them.  If there are any pets around you, be aware of where they are.  Review your medicines with your doctor. Some medicines can make you feel dizzy. This can increase your chance of falling. Ask your doctor what other things that you can do to help prevent falls. This information is not intended to replace advice given to you by your health care provider. Make sure you discuss any questions you have with your health care provider. Document Released: 04/20/2009 Document Revised: 11/30/2015 Document Reviewed: 07/29/2014 Elsevier Interactive Patient Education  2017 [employer].

## 2017-11-18 ENCOUNTER — Telehealth: Payer: Self-pay

## 2017-11-18 LAB — BASIC METABOLIC PANEL
BUN/Creatinine Ratio: 28 — ABNORMAL HIGH (ref 10–24)
BUN: 27 mg/dL (ref 10–36)
CALCIUM: 10.7 mg/dL — AB (ref 8.6–10.2)
CO2: 27 mmol/L (ref 20–29)
CREATININE: 0.97 mg/dL (ref 0.76–1.27)
Chloride: 103 mmol/L (ref 96–106)
GFR calc non Af Amer: 68 mL/min/{1.73_m2} (ref >59)
GFR, EST AFRICAN AMERICAN: 79 mL/min/{1.73_m2} (ref >59)
Glucose: 97 mg/dL (ref 65–99)
POTASSIUM: 4.6 mmol/L (ref 3.5–5.2)
Sodium: 142 mmol/L (ref 134–144)

## 2017-11-18 LAB — MAGNESIUM: Magnesium: 2.4 mg/dL — ABNORMAL HIGH (ref 1.6–2.3)

## 2017-11-18 NOTE — Telephone Encounter (Signed)
-----   Message from [name], MD sent at 11/18/2017  9:56 AM EDT ----- Calcium remains elevated.  (Please add on PTH to labs)..  We will work this up further with additional lab work.  Labs otherwise normal.  Bacigalupo, Dionne Bucy, MD, MPH [facility] [place] 11/18/2017 9:56 AM

## 2017-11-18 NOTE — Telephone Encounter (Signed)
LMTCB on patient voicemail, contacted [facility] and added PTH to labs drawn . KW

## 2017-11-18 NOTE — Assessment & Plan Note (Signed)
Uncontrolled Resume triamcinolone Continue emollient BID Follow with Derm as scheduled

## 2017-11-18 NOTE — Telephone Encounter (Signed)
[name] had called back from [facility] and states that patient would need to have this lab drawn because it would have to be in a serum tube and that was not drawn with previous labs. Please place order in chart for lab and I will notify patient when he calls back to have testing done. Thanks. KW

## 2017-11-18 NOTE — Assessment & Plan Note (Signed)
A1c previously well controlled F/u at next visit

## 2017-11-18 NOTE — Assessment & Plan Note (Signed)
Discussed safety measures Resume PT

## 2017-11-19 NOTE — Telephone Encounter (Signed)
Pt advised and future labs ordered.

## 2017-11-19 NOTE — Telephone Encounter (Signed)
Plan to have patient come back in for more lab work.  Let us order ionized calcium, PTH, PTHrP, 1, 25 dihydroxy vitamin D, 25 hydroxy vitamin D.  [name], MD, MPH [facility] 11/19/2017 2:52 PM

## 2017-11-20 ENCOUNTER — Ambulatory Visit (INDEPENDENT_AMBULATORY_CARE_PROVIDER_SITE_OTHER): Payer: Medicare Other

## 2017-11-20 ENCOUNTER — Other Ambulatory Visit: Payer: Self-pay

## 2017-11-20 DIAGNOSIS — R350 Frequency of micturition: Secondary | ICD-10-CM

## 2017-11-20 NOTE — Progress Notes (Signed)
PTNS  Session # monthly  Health & Social Factors: urinary frequency Caffeine: 0 Alcohol: 0 Daytime voids #per day: 7 Night-time voids #per night: 3 Urgency: none Incontinence Episodes #per day: 0 Ankle used: left Treatment Setting: 12 Feeling/ Response: both Comments: none  Preformed By: Fonnie Jarvis, CMA    Follow Up: 1 month

## 2017-11-25 ENCOUNTER — Telehealth: Payer: Self-pay

## 2017-11-25 NOTE — Telephone Encounter (Signed)
-----   Message from [name], MD sent at 11/24/2017  1:32 PM EDT ----- Ionized calcium and Vit D and Parathyroid hormone levels normal.  No further work-up needed.  [name], MD, MPH [facility] 11/24/2017 1:32 PM

## 2017-11-25 NOTE — Telephone Encounter (Signed)
Pt advised.

## 2017-11-28 LAB — PARATHYROID HORMONE, INTACT (NO CA): PTH: 42 pg/mL (ref 15–65)

## 2017-11-28 LAB — PTH-RELATED PEPTIDE: PTH-related peptide: <2 pmol/L

## 2017-11-28 LAB — CALCIUM, IONIZED: Calcium, Ion: 5.5 mg/dL (ref 4.5–5.6)

## 2017-11-28 LAB — VITAMIN D 25 HYDROXY (VIT D DEFICIENCY, FRACTURES): Vit D, 25-Hydroxy: 36.8 ng/mL (ref 30.0–100.0)

## 2017-12-04 ENCOUNTER — Other Ambulatory Visit: Payer: Self-pay | Admitting: Family Medicine

## 2017-12-04 DIAGNOSIS — C61 Malignant neoplasm of prostate: Secondary | ICD-10-CM

## 2017-12-05 ENCOUNTER — Other Ambulatory Visit: Payer: Medicare Other

## 2017-12-05 DIAGNOSIS — C61 Malignant neoplasm of prostate: Secondary | ICD-10-CM | POA: Diagnosis not present

## 2017-12-06 LAB — PSA: Prostate Specific Ag, Serum: 0.6 ng/mL (ref 0.0–4.0)

## 2017-12-07 NOTE — Progress Notes (Signed)
12/08/2017 2:21 PM   [name] [birth year] [id number]  Referring provider: [name], MD [address] [place], [place] [address]  Chief Complaint  Patient presents with  . Prostate Cancer    51month    HPI: Patient is an 84 year old Caucasian male who presents today for a 6 month follow up for prostate cancer.  Prostate cancer Patient who underwent cryotherapy for a Gleason's 6 (10 cores + out of 12)-invasion present with a PSA of 4.3 and a 11% free PSA PCa in 2009 with [name].  He has not had any weight loss, bone pain or appetite loss.  He generally feels well.  He has not had any fevers, chills, nauseas or vomiting.  He complaining of frequency, nocturia and incontinence.  He has not had any dysuria, gross hematuria or suprapubic pain.  His I PSS score today is 9/2.  His previous I PSS was 8/2.  His major complaints are urgency and nocturia x 2.  His current PSA is 0.6 ng/mL in 11/2017 unchanged from one year ago.   IPSS    Row Name 12/08/17 1400         International Prostate Symptom Score   How often have you had the sensation of not emptying your bladder?  Less than half the time     How often have you had to urinate less than every two hours?  Less than 1 in 5 times     How often have you found you stopped and started again several times when you urinated?  Not at All     How often have you found it difficult to postpone urination?  About half the time     How often have you had a weak urinary stream?  Not at All     How often have you had to strain to start urination?  Not at All     How many times did you typically get up at night to urinate?  3 Times     Total IPSS Score  9       Quality of Life due to urinary symptoms   If you were to spend the rest of your life with your urinary condition just the way it is now how would you feel about that?  Mostly Satisfied        Score:  1-7 Mild 8-19 Moderate 20-35 Severe    PMH: Past  Medical History:  Diagnosis Date  . Arthritis   . Bladder outlet obstruction   . Erectile dysfunction   . GERD (gastroesophageal reflux disease)   . Hard of hearing   . HBP (high blood pressure)   . History of nephrolithiasis   . HLD (hyperlipidemia)   . Incomplete bladder emptying   . Nocturia   . Osteoporosis   . Prostate cancer ([facility])   . Shingles 2014  . Sinus problem   . Stomach ulcer   . Stroke ([place]) 2012  . Urinary frequency   . Urinary leakage     Surgical History: Past Surgical History:  Procedure Laterality Date  . CATARACT EXTRACTION  2000, 2001  . CHOLECYSTECTOMY  1972  . PROSTATE SURGERY  2008/2009  . TONSILLECTOMY    . UMBILICAL HERNIA REPAIR      Home Medications:  Allergies as of 12/08/2017      Reactions   Toviaz [fesoterodine Fumarate Er] Other (See Comments)   Acid reflux      Medication  List        Accurate as of 12/08/17  2:21 PM. Always use your most recent med list.          amLODipine 5 MG tablet Commonly known as:  NORVASC Take 1 tablet (5 mg total) by mouth daily.   aspirin 81 MG tablet Take 81 mg by mouth daily.   CERAVE Crea Apply topically.   mineral oil-hydrophilic petrolatum ointment Apply topically as needed for dry skin.   escitalopram 10 MG tablet Commonly known as:  LEXAPRO Take 1 tablet (10 mg total) by mouth at bedtime.   FLAX SEED OIL PO Take by mouth daily.   hydrochlorothiazide 12.5 MG capsule Commonly known as:  MICROZIDE TAKE 1 CAPSULE BY MOUTH ONCE DAILY   hydrOXYzine 10 MG tablet Commonly known as:  ATARAX/VISTARIL Take 0.5-1 tablets (5-10 mg total) by mouth at bedtime as needed.   multivitamin capsule Take 1 capsule by mouth daily.   naproxen sodium 220 MG tablet Commonly known as:  ALEVE Take 440 mg by mouth as needed.   OVER THE COUNTER MEDICATION Take 1 tablet by mouth 2 (two) times daily. ESTER C   OVER THE COUNTER MEDICATION   polyethylene glycol powder powder Commonly known as:   GLYCOLAX/MIRALAX Take 17 g by mouth daily.   tamsulosin 0.4 MG Caps capsule Commonly known as:  FLOMAX TAKE ONE CAPSULE BY MOUTH ONCE DAILY   triamcinolone ointment 0.5 % Commonly known as:  KENALOG Apply 1 application topically 2 (two) times daily.   zinc sulfate 220 (50 Zn) MG capsule Take by mouth daily.       Allergies:  Allergies  Allergen Reactions  . Toviaz [Fesoterodine Fumarate Er] Other (See Comments)    Acid reflux    Family History: Family History  Problem Relation Age of Onset  . Cancer Mother   . Stroke Father   . Stroke Sister   . Prostate cancer Brother   . Kidney disease Neg Hx   . Kidney cancer Neg Hx   . Bladder Cancer Neg Hx     Social History:  reports that he quit smoking about 55 years ago. He quit after 10.00 years of use. He has never used smokeless tobacco. He reports that he does not drink alcohol or use drugs.  ROS: UROLOGY Frequent Urination?: Yes Hard to postpone urination?: No Burning/pain with urination?: No Get up at night to urinate?: Yes Leakage of urine?: Yes Urine stream starts and stops?: No Trouble starting stream?: No Do you have to strain to urinate?: No Blood in urine?: No Urinary tract infection?: No Sexually transmitted disease?: No Injury to kidneys or bladder?: No Painful intercourse?: No Weak stream?: No Erection problems?: No Penile pain?: No  Gastrointestinal Nausea?: No Vomiting?: No Indigestion/heartburn?: No Diarrhea?: No Constipation?: No  Constitutional Fever: No Night sweats?: No Weight loss?: No Fatigue?: No  Skin Skin rash/lesions?: No Itching?: No  Eyes Blurred vision?: No Double vision?: No  Ears/Nose/Throat Sore throat?: No Sinus problems?: Yes  Hematologic/Lymphatic Swollen glands?: No Easy bruising?: No  Cardiovascular Leg swelling?: No Chest pain?: No  Respiratory Cough?: No Shortness of breath?: No  Endocrine Excessive thirst?: No  Musculoskeletal Back  pain?: No Joint pain?: No  Neurological Headaches?: No Dizziness?: No  Psychologic Depression?: No Anxiety?: No  Physical Exam: BP (!) 110/51   Pulse 73   Ht 5\' 9"  (1.753 m)   Wt 185 lb (83.9 kg)   BMI 27.32 kg/m   Constitutional: Well nourished. Alert and  oriented, No acute distress. HEENT: [place] AT, moist mucus membranes. Trachea midline, no masses. Cardiovascular: No clubbing, cyanosis, or edema. Respiratory: Normal respiratory effort, no increased work of breathing. GI: Abdomen is soft, non tender, non distended, no abdominal masses. Liver and spleen not palpable.  No hernias appreciated.  Stool sample for occult testing is not indicated.   GU: No CVA tenderness.  No bladder fullness or masses.  Patient with circumcised phallus.  Urethral meatus is patent.  No penile discharge. No penile lesions or rashes. Scrotum without lesions, cysts, rashes and/or edema.  Testicles are located scrotally bilaterally. No masses are appreciated in the testicles. Left and right epididymis are normal. Rectal: Patient with  normal sphincter tone. Anus and perineum without scarring or rashes. No rectal masses are appreciated. Prostate is approximately 45 grams, no nodules are appreciated. Seminal vesicles are normal. Skin: No rashes, bruises or suspicious lesions. Lymph: No cervical or inguinal adenopathy. Neurologic: Grossly intact, no focal deficits, moving all 4 extremities. Psychiatric: Normal mood and affect.   Laboratory Data: PSA History  0.6 ng/mL on 04/29/2016  0.6 ng/mL on 11/01/2016  0.6 ng/mL on 05/05/2017  0.6 ng/mL on 11/2017  Lab Results  Component Value Date   WBC 4.4 01/30/2017   HGB 14.0 01/30/2017   HCT 40.7 01/30/2017   MCV 88 01/30/2017   PLT 240 01/30/2017    Lab Results  Component Value Date   CREATININE 0.97 11/17/2017    Lab Results  Component Value Date   HGBA1C 6.0 09/15/2017       Component Value Date/Time   CHOL 173 04/01/2017 1330   CHOL 147  09/15/2015 1043   HDL 61 04/01/2017 1330   HDL 59 09/15/2015 1043   CHOLHDL 2.8 04/01/2017 1330   LDLCALC 92 04/01/2017 1330    Lab Results  Component Value Date   AST 30 01/30/2017   Lab Results  Component Value Date   ALT 21 01/30/2017   I have reviewed the labs.  Assessment & Plan:    1. Prostate cancer: Patient underwent cryotherapy for a Gleason's 6 (10 cores + out of 12)-invasion present with a PSA of 4.3 and a 11% free PSA PCa in 2009. His most recent PSA was 0.6 ng/mL in 11/2017.  IPSS is stable.  He will return in 6 months for a repeated PSA and exam.    Return in about 6 months (around 06/09/2018) for PSA and exam.  These notes generated with voice recognition software. I apologize for typographical errors.  Zara Council, PA-C  [facility] [address] [place], [place] [address] [phone]

## 2017-12-08 ENCOUNTER — Ambulatory Visit (INDEPENDENT_AMBULATORY_CARE_PROVIDER_SITE_OTHER): Payer: Medicare Other | Admitting: Urology

## 2017-12-08 ENCOUNTER — Encounter: Payer: Self-pay | Admitting: Urology

## 2017-12-08 VITALS — BP 110/51 | HR 73 | Ht 69.0 in | Wt 185.0 lb

## 2017-12-08 DIAGNOSIS — C61 Malignant neoplasm of prostate: Secondary | ICD-10-CM | POA: Diagnosis not present

## 2017-12-11 DIAGNOSIS — L720 Epidermal cyst: Secondary | ICD-10-CM | POA: Diagnosis not present

## 2017-12-11 DIAGNOSIS — L500 Allergic urticaria: Secondary | ICD-10-CM | POA: Diagnosis not present

## 2017-12-11 DIAGNOSIS — L309 Dermatitis, unspecified: Secondary | ICD-10-CM | POA: Diagnosis not present

## 2017-12-15 DIAGNOSIS — R278 Other lack of coordination: Secondary | ICD-10-CM | POA: Diagnosis not present

## 2017-12-15 DIAGNOSIS — R2689 Other abnormalities of gait and mobility: Secondary | ICD-10-CM | POA: Diagnosis not present

## 2017-12-15 DIAGNOSIS — R293 Abnormal posture: Secondary | ICD-10-CM | POA: Diagnosis not present

## 2017-12-16 DIAGNOSIS — R278 Other lack of coordination: Secondary | ICD-10-CM | POA: Diagnosis not present

## 2017-12-16 DIAGNOSIS — R2689 Other abnormalities of gait and mobility: Secondary | ICD-10-CM | POA: Diagnosis not present

## 2017-12-16 DIAGNOSIS — R293 Abnormal posture: Secondary | ICD-10-CM | POA: Diagnosis not present

## 2017-12-18 DIAGNOSIS — R2689 Other abnormalities of gait and mobility: Secondary | ICD-10-CM | POA: Diagnosis not present

## 2017-12-18 DIAGNOSIS — R293 Abnormal posture: Secondary | ICD-10-CM | POA: Diagnosis not present

## 2017-12-18 DIAGNOSIS — R278 Other lack of coordination: Secondary | ICD-10-CM | POA: Diagnosis not present

## 2017-12-22 ENCOUNTER — Ambulatory Visit (INDEPENDENT_AMBULATORY_CARE_PROVIDER_SITE_OTHER): Payer: Medicare Other

## 2017-12-22 DIAGNOSIS — R293 Abnormal posture: Secondary | ICD-10-CM | POA: Diagnosis not present

## 2017-12-22 DIAGNOSIS — R350 Frequency of micturition: Secondary | ICD-10-CM | POA: Diagnosis not present

## 2017-12-22 DIAGNOSIS — R278 Other lack of coordination: Secondary | ICD-10-CM | POA: Diagnosis not present

## 2017-12-22 DIAGNOSIS — R2689 Other abnormalities of gait and mobility: Secondary | ICD-10-CM | POA: Diagnosis not present

## 2017-12-22 NOTE — Progress Notes (Signed)
PTNS  Session # monthly  Health & Social Factors: same Caffeine: 0 Alcohol: 0 Daytime voids #per day: 7-8 Night-time voids #per night: 3 Urgency: none Incontinence Episodes #per day: 0 Ankle used: left Treatment Setting: 8 Feeling/ Response: both Comments: none  Preformed By: Fonnie Jarvis, CMA   Follow Up: month

## 2017-12-24 DIAGNOSIS — R278 Other lack of coordination: Secondary | ICD-10-CM | POA: Diagnosis not present

## 2017-12-24 DIAGNOSIS — R2689 Other abnormalities of gait and mobility: Secondary | ICD-10-CM | POA: Diagnosis not present

## 2017-12-24 DIAGNOSIS — R293 Abnormal posture: Secondary | ICD-10-CM | POA: Diagnosis not present

## 2017-12-26 DIAGNOSIS — R2689 Other abnormalities of gait and mobility: Secondary | ICD-10-CM | POA: Diagnosis not present

## 2017-12-26 DIAGNOSIS — R278 Other lack of coordination: Secondary | ICD-10-CM | POA: Diagnosis not present

## 2017-12-26 DIAGNOSIS — R293 Abnormal posture: Secondary | ICD-10-CM | POA: Diagnosis not present

## 2017-12-29 DIAGNOSIS — R278 Other lack of coordination: Secondary | ICD-10-CM | POA: Diagnosis not present

## 2017-12-29 DIAGNOSIS — R2689 Other abnormalities of gait and mobility: Secondary | ICD-10-CM | POA: Diagnosis not present

## 2017-12-29 DIAGNOSIS — R293 Abnormal posture: Secondary | ICD-10-CM | POA: Diagnosis not present

## 2017-12-31 DIAGNOSIS — R293 Abnormal posture: Secondary | ICD-10-CM | POA: Diagnosis not present

## 2017-12-31 DIAGNOSIS — R278 Other lack of coordination: Secondary | ICD-10-CM | POA: Diagnosis not present

## 2017-12-31 DIAGNOSIS — R2689 Other abnormalities of gait and mobility: Secondary | ICD-10-CM | POA: Diagnosis not present

## 2018-01-02 DIAGNOSIS — R278 Other lack of coordination: Secondary | ICD-10-CM | POA: Diagnosis not present

## 2018-01-02 DIAGNOSIS — R293 Abnormal posture: Secondary | ICD-10-CM | POA: Diagnosis not present

## 2018-01-02 DIAGNOSIS — R2689 Other abnormalities of gait and mobility: Secondary | ICD-10-CM | POA: Diagnosis not present

## 2018-01-05 DIAGNOSIS — R293 Abnormal posture: Secondary | ICD-10-CM | POA: Diagnosis not present

## 2018-01-05 DIAGNOSIS — R278 Other lack of coordination: Secondary | ICD-10-CM | POA: Diagnosis not present

## 2018-01-05 DIAGNOSIS — R2689 Other abnormalities of gait and mobility: Secondary | ICD-10-CM | POA: Diagnosis not present

## 2018-01-07 DIAGNOSIS — R293 Abnormal posture: Secondary | ICD-10-CM | POA: Diagnosis not present

## 2018-01-07 DIAGNOSIS — R2689 Other abnormalities of gait and mobility: Secondary | ICD-10-CM | POA: Diagnosis not present

## 2018-01-07 DIAGNOSIS — R278 Other lack of coordination: Secondary | ICD-10-CM | POA: Diagnosis not present

## 2018-01-09 DIAGNOSIS — R2689 Other abnormalities of gait and mobility: Secondary | ICD-10-CM | POA: Diagnosis not present

## 2018-01-09 DIAGNOSIS — R293 Abnormal posture: Secondary | ICD-10-CM | POA: Diagnosis not present

## 2018-01-09 DIAGNOSIS — R278 Other lack of coordination: Secondary | ICD-10-CM | POA: Diagnosis not present

## 2018-01-12 DIAGNOSIS — R2689 Other abnormalities of gait and mobility: Secondary | ICD-10-CM | POA: Diagnosis not present

## 2018-01-12 DIAGNOSIS — R293 Abnormal posture: Secondary | ICD-10-CM | POA: Diagnosis not present

## 2018-01-12 DIAGNOSIS — R278 Other lack of coordination: Secondary | ICD-10-CM | POA: Diagnosis not present

## 2018-01-14 DIAGNOSIS — R278 Other lack of coordination: Secondary | ICD-10-CM | POA: Diagnosis not present

## 2018-01-14 DIAGNOSIS — R293 Abnormal posture: Secondary | ICD-10-CM | POA: Diagnosis not present

## 2018-01-14 DIAGNOSIS — R2689 Other abnormalities of gait and mobility: Secondary | ICD-10-CM | POA: Diagnosis not present

## 2018-01-16 DIAGNOSIS — R278 Other lack of coordination: Secondary | ICD-10-CM | POA: Diagnosis not present

## 2018-01-16 DIAGNOSIS — R2689 Other abnormalities of gait and mobility: Secondary | ICD-10-CM | POA: Diagnosis not present

## 2018-01-16 DIAGNOSIS — R293 Abnormal posture: Secondary | ICD-10-CM | POA: Diagnosis not present

## 2018-01-19 DIAGNOSIS — R2689 Other abnormalities of gait and mobility: Secondary | ICD-10-CM | POA: Diagnosis not present

## 2018-01-19 DIAGNOSIS — R278 Other lack of coordination: Secondary | ICD-10-CM | POA: Diagnosis not present

## 2018-01-19 DIAGNOSIS — R293 Abnormal posture: Secondary | ICD-10-CM | POA: Diagnosis not present

## 2018-01-20 DIAGNOSIS — H40052 Ocular hypertension, left eye: Secondary | ICD-10-CM | POA: Diagnosis not present

## 2018-01-21 DIAGNOSIS — R293 Abnormal posture: Secondary | ICD-10-CM | POA: Diagnosis not present

## 2018-01-21 DIAGNOSIS — R278 Other lack of coordination: Secondary | ICD-10-CM | POA: Diagnosis not present

## 2018-01-21 DIAGNOSIS — R2689 Other abnormalities of gait and mobility: Secondary | ICD-10-CM | POA: Diagnosis not present

## 2018-01-22 ENCOUNTER — Ambulatory Visit (INDEPENDENT_AMBULATORY_CARE_PROVIDER_SITE_OTHER): Payer: Medicare Other

## 2018-01-22 DIAGNOSIS — R350 Frequency of micturition: Secondary | ICD-10-CM

## 2018-01-22 NOTE — Progress Notes (Signed)
PTNS  Session # monthly  Health & Social Factors: same Caffeine: 0 Alcohol: 0 Daytime voids #per day: 7 Night-time voids #per night: 2 Urgency: none Incontinence Episodes #per day: 0 Ankle used: left Treatment Setting: 7 Feeling/ Response: both Comments: none  Preformed By: Fonnie Jarvis, CMA   Follow Up: 1 month

## 2018-01-23 DIAGNOSIS — Z79899 Other long term (current) drug therapy: Secondary | ICD-10-CM | POA: Diagnosis not present

## 2018-01-23 DIAGNOSIS — L500 Allergic urticaria: Secondary | ICD-10-CM | POA: Diagnosis not present

## 2018-01-23 DIAGNOSIS — R293 Abnormal posture: Secondary | ICD-10-CM | POA: Diagnosis not present

## 2018-01-23 DIAGNOSIS — Z5181 Encounter for therapeutic drug level monitoring: Secondary | ICD-10-CM | POA: Diagnosis not present

## 2018-01-23 DIAGNOSIS — L508 Other urticaria: Secondary | ICD-10-CM | POA: Diagnosis not present

## 2018-01-23 DIAGNOSIS — R278 Other lack of coordination: Secondary | ICD-10-CM | POA: Diagnosis not present

## 2018-01-23 DIAGNOSIS — R2689 Other abnormalities of gait and mobility: Secondary | ICD-10-CM | POA: Diagnosis not present

## 2018-01-23 DIAGNOSIS — L309 Dermatitis, unspecified: Secondary | ICD-10-CM | POA: Diagnosis not present

## 2018-01-26 DIAGNOSIS — R2689 Other abnormalities of gait and mobility: Secondary | ICD-10-CM | POA: Diagnosis not present

## 2018-01-26 DIAGNOSIS — R293 Abnormal posture: Secondary | ICD-10-CM | POA: Diagnosis not present

## 2018-01-26 DIAGNOSIS — R278 Other lack of coordination: Secondary | ICD-10-CM | POA: Diagnosis not present

## 2018-01-28 DIAGNOSIS — R2689 Other abnormalities of gait and mobility: Secondary | ICD-10-CM | POA: Diagnosis not present

## 2018-01-28 DIAGNOSIS — R293 Abnormal posture: Secondary | ICD-10-CM | POA: Diagnosis not present

## 2018-01-28 DIAGNOSIS — R278 Other lack of coordination: Secondary | ICD-10-CM | POA: Diagnosis not present

## 2018-01-29 DIAGNOSIS — R278 Other lack of coordination: Secondary | ICD-10-CM | POA: Diagnosis not present

## 2018-01-29 DIAGNOSIS — R293 Abnormal posture: Secondary | ICD-10-CM | POA: Diagnosis not present

## 2018-01-29 DIAGNOSIS — R2689 Other abnormalities of gait and mobility: Secondary | ICD-10-CM | POA: Diagnosis not present

## 2018-02-02 DIAGNOSIS — R278 Other lack of coordination: Secondary | ICD-10-CM | POA: Diagnosis not present

## 2018-02-02 DIAGNOSIS — R293 Abnormal posture: Secondary | ICD-10-CM | POA: Diagnosis not present

## 2018-02-02 DIAGNOSIS — R2689 Other abnormalities of gait and mobility: Secondary | ICD-10-CM | POA: Diagnosis not present

## 2018-02-04 DIAGNOSIS — R2689 Other abnormalities of gait and mobility: Secondary | ICD-10-CM | POA: Diagnosis not present

## 2018-02-04 DIAGNOSIS — R278 Other lack of coordination: Secondary | ICD-10-CM | POA: Diagnosis not present

## 2018-02-04 DIAGNOSIS — R293 Abnormal posture: Secondary | ICD-10-CM | POA: Diagnosis not present

## 2018-02-06 DIAGNOSIS — R293 Abnormal posture: Secondary | ICD-10-CM | POA: Diagnosis not present

## 2018-02-06 DIAGNOSIS — R2689 Other abnormalities of gait and mobility: Secondary | ICD-10-CM | POA: Diagnosis not present

## 2018-02-06 DIAGNOSIS — R278 Other lack of coordination: Secondary | ICD-10-CM | POA: Diagnosis not present

## 2018-02-09 DIAGNOSIS — R278 Other lack of coordination: Secondary | ICD-10-CM | POA: Diagnosis not present

## 2018-02-09 DIAGNOSIS — R2689 Other abnormalities of gait and mobility: Secondary | ICD-10-CM | POA: Diagnosis not present

## 2018-02-09 DIAGNOSIS — R293 Abnormal posture: Secondary | ICD-10-CM | POA: Diagnosis not present

## 2018-02-11 DIAGNOSIS — R278 Other lack of coordination: Secondary | ICD-10-CM | POA: Diagnosis not present

## 2018-02-11 DIAGNOSIS — R2689 Other abnormalities of gait and mobility: Secondary | ICD-10-CM | POA: Diagnosis not present

## 2018-02-11 DIAGNOSIS — R293 Abnormal posture: Secondary | ICD-10-CM | POA: Diagnosis not present

## 2018-02-13 DIAGNOSIS — R293 Abnormal posture: Secondary | ICD-10-CM | POA: Diagnosis not present

## 2018-02-13 DIAGNOSIS — R2689 Other abnormalities of gait and mobility: Secondary | ICD-10-CM | POA: Diagnosis not present

## 2018-02-13 DIAGNOSIS — R278 Other lack of coordination: Secondary | ICD-10-CM | POA: Diagnosis not present

## 2018-02-16 DIAGNOSIS — R2689 Other abnormalities of gait and mobility: Secondary | ICD-10-CM | POA: Diagnosis not present

## 2018-02-16 DIAGNOSIS — R293 Abnormal posture: Secondary | ICD-10-CM | POA: Diagnosis not present

## 2018-02-16 DIAGNOSIS — R278 Other lack of coordination: Secondary | ICD-10-CM | POA: Diagnosis not present

## 2018-02-20 DIAGNOSIS — R278 Other lack of coordination: Secondary | ICD-10-CM | POA: Diagnosis not present

## 2018-02-20 DIAGNOSIS — R2689 Other abnormalities of gait and mobility: Secondary | ICD-10-CM | POA: Diagnosis not present

## 2018-02-20 DIAGNOSIS — R293 Abnormal posture: Secondary | ICD-10-CM | POA: Diagnosis not present

## 2018-02-23 DIAGNOSIS — R2689 Other abnormalities of gait and mobility: Secondary | ICD-10-CM | POA: Diagnosis not present

## 2018-02-23 DIAGNOSIS — R293 Abnormal posture: Secondary | ICD-10-CM | POA: Diagnosis not present

## 2018-02-23 DIAGNOSIS — R278 Other lack of coordination: Secondary | ICD-10-CM | POA: Diagnosis not present

## 2018-02-25 ENCOUNTER — Telehealth: Payer: Self-pay | Admitting: Urology

## 2018-02-25 DIAGNOSIS — R278 Other lack of coordination: Secondary | ICD-10-CM | POA: Diagnosis not present

## 2018-02-25 DIAGNOSIS — R2689 Other abnormalities of gait and mobility: Secondary | ICD-10-CM | POA: Diagnosis not present

## 2018-02-25 DIAGNOSIS — R293 Abnormal posture: Secondary | ICD-10-CM | POA: Diagnosis not present

## 2018-02-25 NOTE — Telephone Encounter (Signed)
SPoke with patient and he thinks that for now he would like to stop PTNS . If he feels like his symptoms are starting to worsen he will call the office to restart treatment.

## 2018-02-25 NOTE — Telephone Encounter (Signed)
Pt called to cancel his 1 month PTNS appt.  His wife had a heart attack, so he is dealing with that.  He would like for someone to call him to talk about his PTNS maintenance.

## 2018-02-26 ENCOUNTER — Ambulatory Visit: Payer: Medicare Other

## 2018-02-27 DIAGNOSIS — R278 Other lack of coordination: Secondary | ICD-10-CM | POA: Diagnosis not present

## 2018-02-27 DIAGNOSIS — R2689 Other abnormalities of gait and mobility: Secondary | ICD-10-CM | POA: Diagnosis not present

## 2018-02-27 DIAGNOSIS — R293 Abnormal posture: Secondary | ICD-10-CM | POA: Diagnosis not present

## 2018-03-02 ENCOUNTER — Encounter: Payer: Self-pay | Admitting: Family Medicine

## 2018-03-02 ENCOUNTER — Ambulatory Visit (INDEPENDENT_AMBULATORY_CARE_PROVIDER_SITE_OTHER): Payer: Medicare Other | Admitting: Family Medicine

## 2018-03-02 VITALS — BP 138/72 | HR 87 | Temp 97.8°F | Wt 176.0 lb

## 2018-03-02 DIAGNOSIS — R05 Cough: Secondary | ICD-10-CM | POA: Diagnosis not present

## 2018-03-02 DIAGNOSIS — R2689 Other abnormalities of gait and mobility: Secondary | ICD-10-CM | POA: Diagnosis not present

## 2018-03-02 DIAGNOSIS — R278 Other lack of coordination: Secondary | ICD-10-CM | POA: Diagnosis not present

## 2018-03-02 DIAGNOSIS — R293 Abnormal posture: Secondary | ICD-10-CM | POA: Diagnosis not present

## 2018-03-02 DIAGNOSIS — R0982 Postnasal drip: Secondary | ICD-10-CM | POA: Diagnosis not present

## 2018-03-02 DIAGNOSIS — R059 Cough, unspecified: Secondary | ICD-10-CM

## 2018-03-02 MED ORDER — FLUTICASONE PROPIONATE 50 MCG/ACT NA SUSP: 2.0000 | Freq: Every day | NASAL | 5 refills | Status: DC

## 2018-03-02 NOTE — Patient Instructions (Signed)

## 2018-03-02 NOTE — Progress Notes (Signed)
Patient: [name] Male    DOB: 1924/12/15   84 y.o.   MRN: [id number] Visit Date: 03/02/2018  Today's Provider: Lavon Paganini, MD   Chief Complaint  Patient presents with  . URI   Subjective:    I, Tiburcio Pea, CMA, am acting as a scribe for Lavon Paganini, MD.   URI   This is a new problem. Episode onset: approximately 3 weeks  The problem has been gradually worsening. There has been no fever. Associated symptoms include congestion, coughing (lingering ) and wheezing (worse at night). Pertinent negatives include no rhinorrhea, sinus pain, sneezing or sore throat. Associated symptoms comments: Postnasal drainage . Treatments tried: home remedies. The treatment provided no relief.   Throat clearing helps with clearance of phlegm. Worried about wheeze at night - Only happens with breathing through mouth.  Home remedy: onion (good for bacteria) - worked well for 3-4 days  Allergies  Allergen Reactions  . Toviaz [Fesoterodine Fumarate Er] Other (See Comments)    Acid reflux     Current Outpatient Medications:  .  amLODipine (NORVASC) 5 MG tablet, Take 1 tablet (5 mg total) by mouth daily., Disp: 90 tablet, Rfl: 3 .  aspirin 81 MG tablet, Take 81 mg by mouth daily., Disp: , Rfl:  .  Emollient (CERAVE) CREA, Apply topically., Disp: , Rfl:  .  escitalopram (LEXAPRO) 10 MG tablet, Take 1 tablet (10 mg total) by mouth at bedtime., Disp: 90 tablet, Rfl: 3 .  Flaxseed, Linseed, (FLAX SEED OIL PO), Take by mouth daily., Disp: , Rfl:  .  folic acid (FOLVITE) 1 MG tablet, , Disp: , Rfl:  .  hydrochlorothiazide (MICROZIDE) 12.5 MG capsule, TAKE 1 CAPSULE BY MOUTH ONCE DAILY, Disp: 90 capsule, Rfl: 3 .  hydrOXYzine (ATARAX/VISTARIL) 10 MG tablet, Take 0.5-1 tablets (5-10 mg total) by mouth at bedtime as needed., Disp: 10 tablet, Rfl: 0 .  methotrexate (RHEUMATREX) 2.5 MG tablet, , Disp: , Rfl:  .  mineral oil-hydrophilic petrolatum (AQUAPHOR) ointment, Apply topically as  needed for dry skin., Disp: , Rfl:  .  Multiple Vitamin (MULTIVITAMIN) capsule, Take 1 capsule by mouth daily., Disp: , Rfl:  .  naproxen sodium (ANAPROX) 220 MG tablet, Take 440 mg by mouth as needed., Disp: , Rfl:  .  OVER THE COUNTER MEDICATION, Take 1 tablet by mouth 2 (two) times daily. ESTER C, Disp: , Rfl:  .  OVER THE COUNTER MEDICATION, , Disp: , Rfl:  .  polyethylene glycol powder (GLYCOLAX/MIRALAX) powder, Take 17 g by mouth daily. (Patient taking differently: Take 17 g by mouth daily. ), Disp: 255 g, Rfl: 11 .  tamsulosin (FLOMAX) 0.4 MG CAPS capsule, TAKE ONE CAPSULE BY MOUTH ONCE DAILY, Disp: 90 capsule, Rfl: 3 .  triamcinolone ointment (KENALOG) 0.5 %, Apply 1 application topically 2 (two) times daily., Disp: 60 g, Rfl: 3 .  zinc sulfate 220 (50 Zn) MG capsule, Take by mouth daily. , Disp: , Rfl:   Review of Systems  Constitutional: Negative.   HENT: Positive for congestion and postnasal drip. Negative for facial swelling, rhinorrhea, sinus pressure, sinus pain, sneezing and sore throat.   Eyes: Negative.   Respiratory: Positive for cough (lingering ) and wheezing (worse at night). Negative for apnea, choking, chest tightness, shortness of breath and stridor.   Cardiovascular: Negative.   Genitourinary: Negative.   Musculoskeletal: Negative.   Skin: Negative.   Neurological: Negative.   Psychiatric/Behavioral: Negative.     Social History  Tobacco Use  . Smoking status: Former Smoker    Years: 10.00    Last attempt to quit: 07/08/1962    Years since quitting: 55.6  . Smokeless tobacco: Never Used  . Tobacco comment: quit 1964  Substance Use Topics  . Alcohol use: No   Objective:   BP 138/72 (BP Location: Right Arm, Patient Position: Sitting, Cuff Size: Normal)   Pulse 87   Temp 97.8 F (36.6 C) (Oral)   Wt 176 lb (79.8 kg)   SpO2 96%   BMI 25.99 kg/m  Vitals:   03/02/18 1359  BP: 138/72  Pulse: 87  Temp: 97.8 F (36.6 C)  TempSrc: Oral  SpO2: 96%    Weight: 176 lb (79.8 kg)     Physical Exam  Constitutional: He is oriented to person, place, and time. He appears well-developed and well-nourished. No distress.  HENT:  Head: Normocephalic and atraumatic.  Right Ear: External ear normal.  Left Ear: External ear normal.  Nose: Mucosal edema present. No rhinorrhea. Right sinus exhibits no maxillary sinus tenderness and no frontal sinus tenderness. Left sinus exhibits no maxillary sinus tenderness and no frontal sinus tenderness.  Mouth/Throat: Uvula is midline, oropharynx is clear and moist and mucous membranes are normal. No oropharyngeal exudate, posterior oropharyngeal edema or posterior oropharyngeal erythema.  Eyes: Conjunctivae are normal. No scleral icterus.  Neck: Neck supple. No thyromegaly present.  Cardiovascular: Normal rate, regular rhythm, normal heart sounds and intact distal pulses.  No murmur heard. Pulmonary/Chest: Effort normal and breath sounds normal. No respiratory distress. He has no wheezes. He has no rales.  Musculoskeletal: He exhibits no edema.  Lymphadenopathy:    He has no cervical adenopathy.  Neurological: He is alert and oriented to person, place, and time.  Skin: Skin is warm and dry. Capillary refill takes less than 2 seconds. No rash noted.  Psychiatric: He has a normal mood and affect. His behavior is normal.  Vitals reviewed.      Assessment & Plan:   1. Post-nasal drip 2. Cough - likely 2/2 allergies - no evidence of strep pharyngitis, CAP, AOM, bacterial sinusitis, or other bacterial infection - discussed symptomatic management, natural course, and return precautions - treat with flonase     Meds ordered this encounter  Medications  . fluticasone (FLONASE) 50 MCG/ACT nasal spray    Sig: Place 2 sprays into both nostrils daily.    Dispense:  16 g    Refill:  5     Return if symptoms worsen or fail to improve.   The entirety of the information documented in the History of Present  Illness, Review of Systems and Physical Exam were personally obtained by me. Portions of this information were initially documented by Tiburcio Pea, CMA and reviewed by me for thoroughness and accuracy.    [name], MD, MPH [facility] 03/02/2018 2:27 PM

## 2018-03-04 DIAGNOSIS — R278 Other lack of coordination: Secondary | ICD-10-CM | POA: Diagnosis not present

## 2018-03-04 DIAGNOSIS — R2689 Other abnormalities of gait and mobility: Secondary | ICD-10-CM | POA: Diagnosis not present

## 2018-03-04 DIAGNOSIS — R293 Abnormal posture: Secondary | ICD-10-CM | POA: Diagnosis not present

## 2018-03-05 DIAGNOSIS — L309 Dermatitis, unspecified: Secondary | ICD-10-CM | POA: Diagnosis not present

## 2018-03-06 DIAGNOSIS — R293 Abnormal posture: Secondary | ICD-10-CM | POA: Diagnosis not present

## 2018-03-06 DIAGNOSIS — R278 Other lack of coordination: Secondary | ICD-10-CM | POA: Diagnosis not present

## 2018-03-06 DIAGNOSIS — R2689 Other abnormalities of gait and mobility: Secondary | ICD-10-CM | POA: Diagnosis not present

## 2018-03-06 DIAGNOSIS — L309 Dermatitis, unspecified: Secondary | ICD-10-CM | POA: Diagnosis not present

## 2018-03-06 DIAGNOSIS — Z5181 Encounter for therapeutic drug level monitoring: Secondary | ICD-10-CM | POA: Diagnosis not present

## 2018-03-09 DIAGNOSIS — R2689 Other abnormalities of gait and mobility: Secondary | ICD-10-CM | POA: Diagnosis not present

## 2018-03-09 DIAGNOSIS — R278 Other lack of coordination: Secondary | ICD-10-CM | POA: Diagnosis not present

## 2018-03-11 DIAGNOSIS — R2689 Other abnormalities of gait and mobility: Secondary | ICD-10-CM | POA: Diagnosis not present

## 2018-03-11 DIAGNOSIS — R278 Other lack of coordination: Secondary | ICD-10-CM | POA: Diagnosis not present

## 2018-03-13 DIAGNOSIS — R2689 Other abnormalities of gait and mobility: Secondary | ICD-10-CM | POA: Diagnosis not present

## 2018-03-13 DIAGNOSIS — R278 Other lack of coordination: Secondary | ICD-10-CM | POA: Diagnosis not present

## 2018-03-16 DIAGNOSIS — R2689 Other abnormalities of gait and mobility: Secondary | ICD-10-CM | POA: Diagnosis not present

## 2018-03-16 DIAGNOSIS — R278 Other lack of coordination: Secondary | ICD-10-CM | POA: Diagnosis not present

## 2018-03-18 DIAGNOSIS — R278 Other lack of coordination: Secondary | ICD-10-CM | POA: Diagnosis not present

## 2018-03-18 DIAGNOSIS — R2689 Other abnormalities of gait and mobility: Secondary | ICD-10-CM | POA: Diagnosis not present

## 2018-03-20 DIAGNOSIS — R278 Other lack of coordination: Secondary | ICD-10-CM | POA: Diagnosis not present

## 2018-03-20 DIAGNOSIS — R2689 Other abnormalities of gait and mobility: Secondary | ICD-10-CM | POA: Diagnosis not present

## 2018-03-23 DIAGNOSIS — R2689 Other abnormalities of gait and mobility: Secondary | ICD-10-CM | POA: Diagnosis not present

## 2018-03-23 DIAGNOSIS — R278 Other lack of coordination: Secondary | ICD-10-CM | POA: Diagnosis not present

## 2018-03-25 DIAGNOSIS — R2689 Other abnormalities of gait and mobility: Secondary | ICD-10-CM | POA: Diagnosis not present

## 2018-03-25 DIAGNOSIS — R278 Other lack of coordination: Secondary | ICD-10-CM | POA: Diagnosis not present

## 2018-03-27 DIAGNOSIS — R2689 Other abnormalities of gait and mobility: Secondary | ICD-10-CM | POA: Diagnosis not present

## 2018-03-27 DIAGNOSIS — R278 Other lack of coordination: Secondary | ICD-10-CM | POA: Diagnosis not present

## 2018-04-11 ENCOUNTER — Emergency Department (HOSPITAL_COMMUNITY)
Admission: EM | Admit: 2018-04-11 | Discharge: 2018-04-11 | Disposition: A | Discharge status: Home / Self Care | Payer: Medicare Other | Attending: Emergency Medicine | Admitting: Emergency Medicine

## 2018-04-11 ENCOUNTER — Emergency Department (HOSPITAL_COMMUNITY): Payer: Medicare Other

## 2018-04-11 ENCOUNTER — Encounter (HOSPITAL_COMMUNITY): Payer: Self-pay

## 2018-04-11 DIAGNOSIS — W0110XA Fall on same level from slipping, tripping and stumbling with subsequent striking against unspecified object, initial encounter: Secondary | ICD-10-CM | POA: Diagnosis not present

## 2018-04-11 DIAGNOSIS — Y9301 Activity, walking, marching and hiking: Secondary | ICD-10-CM | POA: Insufficient documentation

## 2018-04-11 DIAGNOSIS — Z7982 Long term (current) use of aspirin: Secondary | ICD-10-CM | POA: Insufficient documentation

## 2018-04-11 DIAGNOSIS — Z79899 Other long term (current) drug therapy: Secondary | ICD-10-CM | POA: Insufficient documentation

## 2018-04-11 DIAGNOSIS — S0990XA Unspecified injury of head, initial encounter: Secondary | ICD-10-CM

## 2018-04-11 DIAGNOSIS — S066X0A Traumatic subarachnoid hemorrhage without loss of consciousness, initial encounter: Secondary | ICD-10-CM | POA: Insufficient documentation

## 2018-04-11 DIAGNOSIS — I10 Essential (primary) hypertension: Secondary | ICD-10-CM | POA: Diagnosis not present

## 2018-04-11 DIAGNOSIS — Y92481 Parking lot as the place of occurrence of the external cause: Secondary | ICD-10-CM | POA: Insufficient documentation

## 2018-04-11 DIAGNOSIS — Z87891 Personal history of nicotine dependence: Secondary | ICD-10-CM | POA: Insufficient documentation

## 2018-04-11 DIAGNOSIS — S01112A Laceration without foreign body of left eyelid and periocular area, initial encounter: Secondary | ICD-10-CM | POA: Diagnosis not present

## 2018-04-11 DIAGNOSIS — S0083XA Contusion of other part of head, initial encounter: Secondary | ICD-10-CM | POA: Diagnosis not present

## 2018-04-11 DIAGNOSIS — S59912A Unspecified injury of left forearm, initial encounter: Secondary | ICD-10-CM | POA: Diagnosis not present

## 2018-04-11 DIAGNOSIS — I609 Nontraumatic subarachnoid hemorrhage, unspecified: Secondary | ICD-10-CM

## 2018-04-11 DIAGNOSIS — S0181XA Laceration without foreign body of other part of head, initial encounter: Secondary | ICD-10-CM

## 2018-04-11 DIAGNOSIS — Z23 Encounter for immunization: Secondary | ICD-10-CM | POA: Insufficient documentation

## 2018-04-11 DIAGNOSIS — Y999 Unspecified external cause status: Secondary | ICD-10-CM | POA: Diagnosis not present

## 2018-04-11 DIAGNOSIS — S0003XA Contusion of scalp, initial encounter: Secondary | ICD-10-CM | POA: Diagnosis not present

## 2018-04-11 DIAGNOSIS — S79922A Unspecified injury of left thigh, initial encounter: Secondary | ICD-10-CM | POA: Diagnosis not present

## 2018-04-11 DIAGNOSIS — W19XXXA Unspecified fall, initial encounter: Secondary | ICD-10-CM | POA: Diagnosis not present

## 2018-04-11 DIAGNOSIS — M79652 Pain in left thigh: Secondary | ICD-10-CM | POA: Diagnosis not present

## 2018-04-11 DIAGNOSIS — M79632 Pain in left forearm: Secondary | ICD-10-CM | POA: Diagnosis not present

## 2018-04-11 LAB — BASIC METABOLIC PANEL
ANION GAP: 8 (ref 5–15)
BUN: 31 mg/dL — ABNORMAL HIGH (ref 8–23)
CHLORIDE: 101 mmol/L (ref 98–111)
CO2: 28 mmol/L (ref 22–32)
Calcium: 10.8 mg/dL — ABNORMAL HIGH (ref 8.9–10.3)
Creatinine, Ser: 1.06 mg/dL (ref 0.61–1.24)
GFR calc non Af Amer: 60 mL/min — ABNORMAL LOW (ref >60)
Glucose, Bld: 116 mg/dL — ABNORMAL HIGH (ref 70–99)
POTASSIUM: 3.8 mmol/L (ref 3.5–5.1)
Sodium: 137 mmol/L (ref 135–145)

## 2018-04-11 LAB — CBC WITH DIFFERENTIAL/PLATELET
ABS IMMATURE GRANULOCYTES: 0 10*3/uL (ref 0.0–0.1)
Basophils Absolute: 0.1 10*3/uL (ref 0.0–0.1)
Basophils Relative: 1 %
Eosinophils Absolute: 0.1 10*3/uL (ref 0.0–0.7)
Eosinophils Relative: 2 %
HEMATOCRIT: 43.4 % (ref 39.0–52.0)
Hemoglobin: 13.8 g/dL (ref 13.0–17.0)
IMMATURE GRANULOCYTES: 1 %
LYMPHS ABS: 0.6 10*3/uL — AB (ref 0.7–4.0)
LYMPHS PCT: 11 %
MCH: 30.1 pg (ref 26.0–34.0)
MCHC: 31.8 g/dL (ref 30.0–36.0)
MCV: 94.8 fL (ref 78.0–100.0)
MONOS PCT: 11 %
Monocytes Absolute: 0.7 10*3/uL (ref 0.1–1.0)
NEUTROS ABS: 4.4 10*3/uL (ref 1.7–7.7)
NEUTROS PCT: 74 %
Platelets: 205 10*3/uL (ref 150–400)
RBC: 4.58 MIL/uL (ref 4.22–5.81)
RDW: 14.3 % (ref 11.5–15.5)
WBC: 5.8 10*3/uL (ref 4.0–10.5)

## 2018-04-11 LAB — I-STAT TROPONIN, ED: Troponin i, poc: 0.01 ng/mL (ref 0.00–0.08)

## 2018-04-11 MED ORDER — MORPHINE SULFATE (PF) 4 MG/ML IV SOLN
4.0000 mg | Freq: Once | INTRAVENOUS | Status: AC
  Administered 2018-04-11: 4 mg via INTRAVENOUS
  Filled 2018-04-11: qty 1

## 2018-04-11 MED ORDER — TETANUS-DIPHTHERIA TOXOIDS TD 5-2 LFU IM INJ
0.5000 mL | INJECTION | Freq: Once | INTRAMUSCULAR | Status: AC
  Administered 2018-04-11: 0.5 mL via INTRAMUSCULAR
  Filled 2018-04-11: qty 0.5

## 2018-04-11 MED ORDER — HYDROCODONE-ACETAMINOPHEN 5-325 MG PO TABS: 1.0000 | ORAL_TABLET | Freq: Four times a day (QID) | ORAL | 0 refills | Status: DC | PRN

## 2018-04-11 NOTE — Discharge Instructions (Addendum)
Please return for any problem.  Follow-up with your regular doctor as instructed.  Do not take aspirin for the next 7 days.  If you develop headache, weakness, nausea, vomiting, fever, or other acute complaint please return immediately to the ED.

## 2018-04-11 NOTE — ED Provider Notes (Signed)
[name]ERG[facility]Y [facility]MENT Provider Note   CSN: [id number] Arrival date & time: 04/11/18  1428     History   Chief Complaint Chief Complaint  Patient presents with  . Fall    HPI [name] is a 84 y.o. male.  84 year old male with prior medical history as documented below presents by EMS following a fall.  Patient was ambulating in the [place] parking lot with his wife when he fell.  Patient thinks he might of tripped on a curb although he is not 100% sure of this.  He struck his left temple against the asphalt.  He may have been briefly unconscious.  He denies associated chest pain, shortness of breath, nausea, vomiting, other focal injury.  He has controlled bleeding to the left scalp.  He denies neck pain.  He denies dental injury.  He denies visual change.  The history is provided by the patient and medical records.  Fall  This is a new problem. The current episode started less than 1 hour ago. The problem occurs rarely. The problem has not changed since onset.Pertinent negatives include no chest pain, no abdominal pain and no headaches. Nothing aggravates the symptoms. Nothing relieves the symptoms. He has tried nothing for the symptoms.    Past Medical History:  Diagnosis Date  . Arthritis   . Bladder outlet obstruction   . Erectile dysfunction   . GERD (gastroesophageal reflux disease)   . Hard of hearing   . HBP (high blood pressure)   . History of nephrolithiasis   . HLD (hyperlipidemia)   . Incomplete bladder emptying   . Nocturia   . Osteoporosis   . Prostate cancer ([place])   . Shingles 2014  . Sinus problem   . Stomach ulcer   . Stroke ([place]) 2012  . Urinary frequency   . Urinary leakage     Patient Active Problem List   Diagnosis Date Noted  . Frequent falls 04/01/2017  . MDD (major depressive disorder) 04/01/2017  . Constipation 03/31/2017  . Eczema 02/12/2017  . Left leg pain 02/12/2017  . Urinary frequency 09/18/2015    . Allergic rhinitis 06/27/2015  . Cerebral infarction ([place]) 06/27/2015  . DD (diverticular disease) 06/27/2015  . Benign essential HTN 06/27/2015  . Hypercholesterolemia 06/27/2015  . Fungal infection of nail 06/27/2015  . Foot pain, right 04/10/2015  . Prediabetes 03/15/2015  . Prostate cancer ([place]) 12/18/2014  . Arthritis, degenerative 01/12/2014  . H/O malignant neoplasm of prostate 12/03/2012  . Urge incontinence 11/09/2012  . Obstruction of urinary tract 11/09/2012  . Dermatophytosis of groin 09/29/2012  . 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 Incomplete bladder emptying 09/29/2012    Past Surgical History:  Procedure Laterality Date  . CATARACT EXTRACTION  2000, 2001  . CHOLECYSTECTOMY  1972  . PROSTATE SURGERY  2008/2009  . TONSILLECTOMY    . UMBILICAL HERNIA REPAIR          Home Medications    Prior to Admission medications   Medication Sig Start Date End Date Taking? Authorizing Provider  amLODipine (NORVASC) 5 MG tablet Take 1 tablet (5 mg total) by mouth daily. 11/17/17   [name], MD  aspirin 81 MG tablet Take 81 mg by mouth daily.    [provider]  Emollient (CERAVE) CREA Apply topically.    [provider]  escitalopram (LEXAPRO) 10 MG tablet Take 1 tablet (10 mg total) by mouth at bedtime. 03/31/17   Bacigalupo,  Dionne Bucy, MD  Flaxseed, Linseed, (FLAX SEED OIL PO) Take by mouth daily.    [provider]  fluticasone (FLONASE) 50 MCG/ACT nasal spray Place 2 sprays into both nostrils daily. 03/02/18   [name], MD  folic acid (FOLVITE) 1 MG tablet  01/30/18   [provider]  hydrochlorothiazide (MICROZIDE) 12.5 MG capsule TAKE 1 CAPSULE BY MOUTH ONCE DAILY 11/06/17   [name], MD  hydrOXYzine (ATARAX/VISTARIL) 10 MG tablet Take 0.5-1 tablets (5-10 mg total) by mouth at bedtime as needed. 07/11/17   [name], MD  methotrexate (RHEUMATREX) 2.5 MG tablet  02/02/18    [provider]  mineral oil-hydrophilic petrolatum (AQUAPHOR) ointment Apply topically as needed for dry skin.    [provider]  Multiple Vitamin (MULTIVITAMIN) capsule Take 1 capsule by mouth daily.    [provider]  naproxen sodium (ANAPROX) 220 MG tablet Take 440 mg by mouth as needed.    [provider]  OVER THE COUNTER MEDICATION Take 1 tablet by mouth 2 (two) times daily. ESTER C    [provider]  OVER THE COUNTER MEDICATION     [provider]  polyethylene glycol powder (GLYCOLAX/MIRALAX) powder Take 17 g by mouth daily. Patient taking differently: Take 17 g by mouth daily.  03/31/17   [name], MD  tamsulosin (FLOMAX) 0.4 MG CAPS capsule TAKE ONE CAPSULE BY MOUTH ONCE DAILY 09/15/17   Ernestine Conrad, Larene Beach A, PA-C  triamcinolone ointment (KENALOG) 0.5 % Apply 1 application topically 2 (two) times daily. 11/17/17   Bacigalupo, Dionne Bucy, MD  zinc sulfate 220 (50 Zn) MG capsule Take by mouth daily.     [provider]    Family History Family History  Problem Relation Age of Onset  . Cancer Mother   . Stroke Father   . Stroke Sister   . Prostate cancer Brother   . Kidney disease Neg Hx   . Kidney cancer Neg Hx   . Bladder Cancer Neg Hx     Social History Social History   Tobacco Use  . Smoking status: Former Smoker    Years: 10.00    Last attempt to quit: 07/08/1962    Years since quitting: 55.7  . Smokeless tobacco: Never Used  . Tobacco comment: quit 1964  Substance Use Topics  . Alcohol use: No  . Drug use: No     Allergies   Toviaz [fesoterodine fumarate er]   Review of Systems Review of Systems  Cardiovascular: Negative for chest pain.  Gastrointestinal: Negative for abdominal pain.  Neurological: Negative for headaches.  All other systems reviewed and are negative.    Physical Exam Updated Vital Signs BP (!) 168/95 (BP Location: Right Arm)   Pulse 70   Temp 97.9 F (36.6 C)  (Oral)   Resp 14   Ht 5\' 9"  (1.753 m)   Wt 77.1 kg   SpO2 100%   BMI 25.10 kg/m   Physical Exam  Constitutional: He is oriented to person, place, and time. He appears well-developed and well-nourished. No distress.  HENT:  Head: Normocephalic.  Mouth/Throat: Oropharynx is clear and moist.  Clotted blood noted to the left scalp and left periorbital region.  Mild edema to the left periorbital region.  EOMI intact.   Eyes: Pupils are equal, round, and reactive to light. Conjunctivae and EOM are normal.  Neck: Normal range of motion. Neck supple.  Cardiovascular: Normal rate, regular rhythm and normal heart sounds.  Pulmonary/Chest: Effort normal and breath sounds normal. No respiratory distress.  Abdominal: Soft. He exhibits no distension. There is no tenderness.  Musculoskeletal: Normal range of motion. He exhibits no edema or deformity.  Neurological: He is alert and oriented to person, place, and time. He displays normal reflexes. No cranial nerve deficit or sensory deficit. He exhibits normal muscle tone. Coordination normal.  GCS 15  Skin: Skin is warm and dry.  Psychiatric: He has a normal mood and affect.  Nursing note and vitals reviewed.    ED Treatments / Results  Labs (all labs ordered are listed, but only abnormal results are displayed) Labs Reviewed  BASIC METABOLIC PANEL - Abnormal; Notable for the following components:      Result Value   Glucose, Bld 116 (*)    BUN 31 (*)    Calcium 10.8 (*)    GFR calc non Af Amer 60 (*)    All other components within normal limits  CBC WITH DIFFERENTIAL/PLATELET - Abnormal; Notable for the following components:   Lymphs Abs 0.6 (*)    All other components within normal limits  I-STAT TROPONIN, ED    EKG EKG Interpretation  Date/Time:  Saturday April 11 2018 14:34:41 EDT Ventricular Rate:  72 PR Interval:    QRS Duration: 119 QT Interval:  402 QTC Calculation: 440 R Axis:   -51 Text Interpretation:  Sinus  rhythm Prolonged PR interval LVH with IVCD, LAD and secondary repol abnrm Confirmed by Dene Gentry [phone]) on 04/11/2018 3:43:14 PM   Radiology Dg Forearm Left  Result Date: 04/11/2018 CLINICAL DATA:  Left forearm pain after trip and fall today. EXAM: LEFT FOREARM - 2 VIEW COMPARISON:  None. FINDINGS: Minimal enthesopathy at the triceps insertion on the olecranon. No fracture or joint dislocation is identified of the left forearm. No significant soft tissue swelling. Soft tissue ossification along the medial epicondyle of the left humerus may represent stigmata of medial epicondylitis. IMPRESSION: Negative for acute fracture joint dislocation of the left forearm. Ancillary findings as above. Electronically Signed   By: Ashley Royalty M.D.   On: 04/11/2018 16:40   Ct Head Wo Contrast  Result Date: 04/11/2018 CLINICAL DATA:  Trauma, fell in [place] parking lot, tripped on curve, fell onto asphalt, loss of consciousness, EXAM: CT HEAD WITHOUT CONTRAST CT MAXILLOFACIAL WITHOUT CONTRAST TECHNIQUE: Multidetector CT imaging of the head and maxillofacial structures were performed using the standard protocol without intravenous contrast. Multiplanar CT image reconstructions of the maxillofacial structures were also generated. COMPARISON:  CT head 11/19/2010, MR brain 11/20/2010 FINDINGS: CT HEAD FINDINGS Brain: Generalized atrophy. Normal ventricular morphology. No midline shift or mass effect. Small vessel chronic ischemic changes of deep cerebral white matter. Minimal prominence of the subdural space on RIGHT versus LEFT, chronic. Old RIGHT cerebellar infarct. Linear high attenuation at a sulcus in the LEFT occipital region compatible with acute subarachnoid hemorrhage. Additional tiny foci of acute subarachnoid hemorrhage at the posterior RIGHT parietal region. No intraparenchymal hemorrhage, mass lesion or evidence of acute infarction. Vascular: Atherosclerotic calcification of internal carotid and vertebral  arteries at skull base Skull: Osseous demineralization. Calvaria intact. LEFT frontotemporal scalp hematoma with few tiny foci of soft tissue gas Other: N/A CT MAXILLOFACIAL FINDINGS Osseous: Beam hardening artifacts of dental origin. TMJ alignment normal bilaterally. Nasal septal deviation to the LEFT. Orbits and zygomas intact. No facial bone fractures identified. Degenerative disc and facet disease changes of the visualized cervical spine. Orbits: Bony orbits intact. Intraorbital soft tissue planes clear without fluid  or gas Sinuses: Paranasal sinuses, mastoid air cells, and middle ear cavities clear bilaterally Soft tissues: Facial hematoma identified LEFT periorbital, LEFT supraorbital, overlying LEFT zygoma and extending into LEFT face and LEFT temporal region. Foci of soft tissue gas lateral to the lateral brittle wall question laceration, with no definite sinus fracture identified. IMPRESSION: Generalized atrophy with small vessel chronic ischemic changes of deep cerebral white matter. Old LEFT cerebellar infarct. Foci of acute subarachnoid hemorrhage identified at the LEFT occipital lobe and posterior RIGHT parietal lobes. No acute intraparenchymal abnormalities identified. No acute facial bone abnormalities. LEFT facial hematoma as above. Findings called to Dr.  Francia Greaves on 04/11/2018 at 1707 hours. Electronically Signed   By: Lavonia Dana M.D.   On: 04/11/2018 17:07   Dg Femur Min 2 Views Left  Result Date: 04/11/2018 CLINICAL DATA:  Patient tripped on a curb falling onto left side. Left forearm and thigh pain. EXAM: LEFT FEMUR 2 VIEWS COMPARISON:  Pelvis radiograph 06/12/2007 FINDINGS: Osteoarthritic joint space narrowing of the left hip and knee. No acute fracture nor joint dislocation. No pelvic diastasis. The included pubic rami appear similar to prior comparison and stable in appearance without acute osseous abnormality. Bone island of the left femoral neck is stable. Atherosclerotic vascular  calcifications are identified from common femoral artery through below-knee popliteal. Mild soft tissue prominence along the lateral aspect of the left thigh is nonspecific but could potentially represent a posttraumatic contusion. IMPRESSION: 1. Mild soft tissue prominence involving the lateral aspect of the thigh could represent stigmata of a posttraumatic contusion. 2. Osteoarthritis of the left hip and knee. 3. No acute fracture nor joint dislocation. Electronically Signed   By: Ashley Royalty M.D.   On: 04/11/2018 16:34   Ct Maxillofacial Wo Cm  Result Date: 04/11/2018 CLINICAL DATA:  Trauma, fell in [employer] parking lot, tripped on curve, fell onto asphalt, loss of consciousness, EXAM: CT HEAD WITHOUT CONTRAST CT MAXILLOFACIAL WITHOUT CONTRAST TECHNIQUE: Multidetector CT imaging of the head and maxillofacial structures were performed using the standard protocol without intravenous contrast. Multiplanar CT image reconstructions of the maxillofacial structures were also generated. COMPARISON:  CT head 11/19/2010, MR brain 11/20/2010 FINDINGS: CT HEAD FINDINGS Brain: Generalized atrophy. Normal ventricular morphology. No midline shift or mass effect. Small vessel chronic ischemic changes of deep cerebral white matter. Minimal prominence of the subdural space on RIGHT versus LEFT, chronic. Old RIGHT cerebellar infarct. Linear high attenuation at a sulcus in the LEFT occipital region compatible with acute subarachnoid hemorrhage. Additional tiny foci of acute subarachnoid hemorrhage at the posterior RIGHT parietal region. No intraparenchymal hemorrhage, mass lesion or evidence of acute infarction. Vascular: Atherosclerotic calcification of internal carotid and vertebral arteries at skull base Skull: Osseous demineralization. Calvaria intact. LEFT frontotemporal scalp hematoma with few tiny foci of soft tissue gas Other: N/A CT MAXILLOFACIAL FINDINGS Osseous: Beam hardening artifacts of dental origin. TMJ alignment  normal bilaterally. Nasal septal deviation to the LEFT. Orbits and zygomas intact. No facial bone fractures identified. Degenerative disc and facet disease changes of the visualized cervical spine. Orbits: Bony orbits intact. Intraorbital soft tissue planes clear without fluid or gas Sinuses: Paranasal sinuses, mastoid air cells, and middle ear cavities clear bilaterally Soft tissues: Facial hematoma identified LEFT periorbital, LEFT supraorbital, overlying LEFT zygoma and extending into LEFT face and LEFT temporal region. Foci of soft tissue gas lateral to the lateral brittle wall question laceration, with no definite sinus fracture identified. IMPRESSION: Generalized atrophy with small vessel chronic ischemic changes of deep cerebral  white matter. Old LEFT cerebellar infarct. Foci of acute subarachnoid hemorrhage identified at the LEFT occipital lobe and posterior RIGHT parietal lobes. No acute intraparenchymal abnormalities identified. No acute facial bone abnormalities. LEFT facial hematoma as above. Findings called to Dr.  Francia Greaves on 04/11/2018 at 1707 hours. Electronically Signed   By: Lavonia Dana M.D.   On: 04/11/2018 17:07    Procedures .Marland KitchenLaceration Repair Date/Time: 04/11/2018 6:09 PM Performed by: Valarie Merino, MD Authorized by: Valarie Merino, MD   Consent:    Consent obtained:  Verbal   Consent given by:  Patient   Risks discussed:  Infection, pain, poor cosmetic result and need for additional repair   Alternatives discussed:  No treatment Anesthesia (see MAR for exact dosages):    Anesthesia method:  None Laceration details:    Location: Lateral aspect of left orbit.   Length (cm):  0.5 Repair type:    Repair type:  Simple Pre-procedure details:    Preparation:  Patient was prepped and draped in usual sterile fashion Exploration:    Hemostasis achieved with:  Direct pressure Treatment:    Area cleansed with:  Saline   Amount of cleaning:  Standard   Irrigation solution:   Sterile saline Skin repair:    Repair method:  Tissue adhesive Approximation:    Approximation:  Close Post-procedure details:    Dressing:  Open (no dressing)   Patient tolerance of procedure:  Tolerated well, no immediate complications   (including critical care time)  Medications Ordered in ED Medications  tetanus & diphtheria toxoids (adult) (TENIVAC) injection 0.5 mL (has no administration in time range)     Initial Impression / Assessment and Plan / ED Course  I have reviewed the triage vital signs and the nursing notes.  Pertinent labs & imaging results that were available during my care of the patient were reviewed by me and considered in my medical decision making (see chart for details).     MDM  Screen complete  Patient is presenting for evaluation following mechanical fall.  He had a closed head injury with a superficial laceration to the lateral aspect of the left orbit.  CT imaging shows a minimal amount of subarachnoid blood.  Case discussed with [place] neuro[facility] (who have reviewed his CT imaging).  Patient does not require admission.  Patient is safe to be discharged home.  Patient is to withhold use of aspirin for the next 7 days.  Patient and wife understand the plan of care.  They declined admission for observation.  The desired discharge.  They understand the need for close follow-up.  Strict return precautions are given and understood.   Final Clinical Impressions(s) / ED Diagnoses   Final diagnoses:  Fall, initial encounter  Injury of head, initial encounter  Facial laceration, initial encounter  Subarachnoid bleed [facility])    ED Discharge Orders    None       Valarie Merino, MD 04/11/18 1810

## 2018-04-11 NOTE — ED Notes (Signed)
Pt returned from xray and CT.

## 2018-04-11 NOTE — ED Notes (Signed)
Pt st's feels better after pain med

## 2018-04-11 NOTE — ED Notes (Signed)
[name], [name], Blue tops sent to [employer]. Dark Green top on rocker in [employer].

## 2018-04-11 NOTE — ED Notes (Signed)
Pt in xray and CT at this time.  

## 2018-04-11 NOTE — ED Triage Notes (Signed)
Pt presents with fall in [place] parking lot, pt supposedly tripped on curb, fell into asphault with +LOC.

## 2018-04-14 ENCOUNTER — Telehealth: Payer: Self-pay | Admitting: Family Medicine

## 2018-04-14 NOTE — Telephone Encounter (Signed)
Pt's wife [name] is requesting walker with seat and breaks for pt because of his balance issus. [name] stated she wasn't sure were she wanted the order to be sent and then stated she has another call coming in and that she would call back. Please advise. Thanks TNP

## 2018-04-15 NOTE — Telephone Encounter (Signed)
Last office visit was 11/17/17, would you advise patient schedule office visit to address? KW

## 2018-04-16 NOTE — Telephone Encounter (Signed)
Can pick up paper Rx to take to medical supply store of choice to get this ordered  Jesus Crews, MD, MPH [facility] 04/16/2018 10:18 AM

## 2018-04-16 NOTE — Telephone Encounter (Signed)
Patient's wife [name] advised RX is at the front desk for pick up.

## 2018-04-24 ENCOUNTER — Ambulatory Visit (INDEPENDENT_AMBULATORY_CARE_PROVIDER_SITE_OTHER): Payer: Medicare Other | Admitting: Family Medicine

## 2018-04-24 DIAGNOSIS — Z23 Encounter for immunization: Secondary | ICD-10-CM | POA: Diagnosis not present

## 2018-04-24 NOTE — Progress Notes (Signed)
Patient here for flu shot only  Cara Aguino, Dionne Bucy, MD, MPH [facility] 04/24/2018 2:19 PM

## 2018-04-29 ENCOUNTER — Encounter: Payer: Self-pay | Admitting: Family Medicine

## 2018-04-29 ENCOUNTER — Ambulatory Visit (INDEPENDENT_AMBULATORY_CARE_PROVIDER_SITE_OTHER): Payer: Medicare Other | Admitting: Family Medicine

## 2018-04-29 VITALS — BP 144/72 | HR 76 | Temp 97.6°F | Resp 16 | Wt 165.0 lb

## 2018-04-29 DIAGNOSIS — I6523 Occlusion and stenosis of bilateral carotid arteries: Secondary | ICD-10-CM

## 2018-04-29 DIAGNOSIS — Z8673 Personal history of transient ischemic attack (TIA), and cerebral infarction without residual deficits: Secondary | ICD-10-CM | POA: Diagnosis not present

## 2018-04-29 DIAGNOSIS — R634 Abnormal weight loss: Secondary | ICD-10-CM | POA: Diagnosis not present

## 2018-04-29 NOTE — Patient Instructions (Signed)
Carotid Artery Disease The carotid arteries are arteries on both sides of the neck. They carry blood to the brain. Carotid artery disease is when the arteries get smaller (narrow) or get blocked. If these arteries get smaller or get blocked, you are more likely to have a stroke or warning stroke (transient ischemic attack). Follow these instructions at home:  Take medicines as told by your doctor. Make sure you understand all your medicine instructions. Do not stop your medicines without talking to your doctor first.  Follow your doctor's diet instructions. It is important to eat a healthy diet that includes plenty of: ? Fresh fruits. ? Vegetables. ? Lean meats.  Avoid: ? High-fat foods. ? High-sodium foods. ? Foods that are fried, overly processed, or have poor nutritional value.  Stay a healthy weight.  Stay active. Get at least 30 minutes of activity every day.  Do not smoke.  Limit alcohol use to: ? No more than 2 drinks a day for men. ? No more than 1 drink a day for women who are not pregnant.  Do not use illegal drugs.  Keep all doctor visits as told. Get help right away if:  You have sudden weakness or loss of feeling (numbness) on one side of the body, such as the face, arm, or leg.  You have sudden confusion.  You have trouble speaking (aphasia) or understanding.  You have sudden trouble seeing out of one or both eyes.  You have sudden trouble walking.  You have dizziness or feel like you might pass out (faint).  You have a loss of balance or your movements are not steady (uncoordinated).  You have a sudden, severe headache with no known cause.  You have trouble swallowing (dysphagia). Call your local emergency services (911 in [place] Do notdrive yourself to the clinic or hospital. This information is not intended to replace advice given to you by your health care provider. Make sure you discuss any questions you have with your health care  provider. Document Released: 06/10/2012 Document Revised: 11/30/2015 Document Reviewed: 12/23/2012 Elsevier Interactive Patient Education  2018 [employer]  

## 2018-04-29 NOTE — Progress Notes (Signed)
Patient: [name] Male    DOB: [birth year]   84 y.o.   MRN: [id number] Visit Date: 04/29/2018  Today's Provider: Lavon Paganini, MD   Chief Complaint  Patient presents with  . Follow-up    From a dental appointment.   Subjective:    HPI   Patient presents today to discuss carotid artery plaque noted on dental XRays.  He has h/o CVA. Last carotid US was in 2012 and showed minimal atherosclerosis b/l.  Was previously on a statin.  Noted that it was elevating his blood sugar and he stopped taking it.  He does not want to take a statin.  Has lost ~10-15lbs over last several months.  He has changed his diet due to wife's recent MI. There are eating DASH diet and he finds he is eating less.   Allergies  Allergen Reactions  . Toviaz [Fesoterodine Fumarate Er] Other (See Comments)    Acid reflux     Current Outpatient Medications:  .  amLODipine (NORVASC) 5 MG tablet, Take 1 tablet (5 mg total) by mouth daily., Disp: 90 tablet, Rfl: 3 .  aspirin EC 81 MG tablet, Take 81 mg by mouth daily., Disp: , Rfl:  .  Bioflavonoid Products (ESTER C PO), Take 2 tablets by mouth daily., Disp: , Rfl:  .  Emollient (CERAVE) CREA, Apply 1 application topically 2 (two) times daily as needed (itching). , Disp: , Rfl:  .  escitalopram (LEXAPRO) 10 MG tablet, Take 1 tablet (10 mg total) by mouth at bedtime., Disp: 90 tablet, Rfl: 3 .  escitalopram (LEXAPRO) 5 MG tablet, Take 5 mg by mouth at bedtime. , Disp: , Rfl:  .  fluticasone (FLONASE) 50 MCG/ACT nasal spray, Place 2 sprays into both nostrils daily. (Patient taking differently: Place 1 spray into both nostrils 2 (two) times daily. ), Disp: 16 g, Rfl: 5 .  folic acid (FOLVITE) 1 MG tablet, Take 1 mg by mouth See admin instructions. Take 1 tablet (1 mg) by mouth daily at bedtime except on Saturday, Disp: , Rfl:  .  hydrochlorothiazide (MICROZIDE) 12.5 MG capsule, TAKE 1 CAPSULE BY MOUTH ONCE DAILY (Patient taking differently: Take 12.5  mg by mouth daily. ), Disp: 90 capsule, Rfl: 3 .  HYDROcodone-acetaminophen (NORCO/VICODIN) 5-325 MG tablet, Take 1-2 tablets by mouth every 6 (six) hours as needed., Disp: 10 tablet, Rfl: 0 .  hydrOXYzine (ATARAX/VISTARIL) 10 MG tablet, Take 0.5-1 tablets (5-10 mg total) by mouth at bedtime as needed., Disp: 10 tablet, Rfl: 0 .  methotrexate (RHEUMATREX) 2.5 MG tablet, Take 10 mg by mouth every Saturday. , Disp: , Rfl:  .  naproxen sodium (ALEVE) 220 MG tablet, Take 220 mg by mouth daily as needed (pain/headache)., Disp: , Rfl:  .  OVER THE COUNTER MEDICATION, Take 1 packet by mouth 2 (two) times daily. Vitamin packet from [facility] for circulation - each packet contains 5 tablets, Disp: , Rfl:  .  Polyethyl Glycol-Propyl Glycol (SYSTANE OP), Place 1 drop into both eyes 2 (two) times daily., Disp: , Rfl:  .  polyethylene glycol powder (GLYCOLAX/MIRALAX) powder, Take 17 g by mouth daily., Disp: 255 g, Rfl: 11 .  tamsulosin (FLOMAX) 0.4 MG CAPS capsule, TAKE ONE CAPSULE BY MOUTH ONCE DAILY (Patient taking differently: Take 0.4 mg by mouth daily. ), Disp: 90 capsule, Rfl: 3 .  triamcinolone ointment (KENALOG) 0.5 %, Apply 1 application topically 2 (two) times daily., Disp: 60 g, Rfl: 3 .  Zinc 50  MG TABS, Take 50 mg by mouth daily. , Disp: , Rfl:   Review of Systems  Constitutional: Negative.   HENT: Negative.   Respiratory: Negative.   Cardiovascular: Negative.   Gastrointestinal: Negative.   Neurological: Negative for dizziness, light-headedness and headaches.  Psychiatric/Behavioral: Negative.     Social History   Tobacco Use  . Smoking status: Former Smoker    Years: 10.00    Last attempt to quit: 07/08/1962    Years since quitting: 55.8  . Smokeless tobacco: Never Used  . Tobacco comment: quit 1964  Substance Use Topics  . Alcohol use: No   Objective:   BP (!) 144/72 (BP Location: Left Arm, Patient Position: Sitting, Cuff Size: Normal)   Pulse 76   Temp 97.6 F (36.4 C) (Oral)    Resp 16   Wt 165 lb (74.8 kg)   BMI 24.37 kg/m  Vitals:   04/29/18 1019  BP: (!) 144/72  Pulse: 76  Resp: 16  Temp: 97.6 F (36.4 C)  TempSrc: Oral  Weight: 165 lb (74.8 kg)     Physical Exam  Constitutional: He is oriented to person, place, and time. He appears well-developed and well-nourished. No distress.  HENT:  Head: Normocephalic and atraumatic.  Eyes: Conjunctivae are normal. No scleral icterus.  Neck: Neck supple. No thyromegaly present.  Cardiovascular: Normal rate, regular rhythm, normal heart sounds and intact distal pulses.  No murmur heard. No carotid bruits  Pulmonary/Chest: Effort normal and breath sounds normal. No respiratory distress. He has no wheezes. He has no rales.  Musculoskeletal: He exhibits no edema.  Lymphadenopathy:    He has no cervical adenopathy.  Neurological: He is alert and oriented to person, place, and time.  Skin: Skin is warm and dry. Capillary refill takes less than 2 seconds. No rash noted.  Psychiatric: He has a normal mood and affect. His behavior is normal.  Vitals reviewed.       Assessment & Plan:   1. H/O: CVA (cerebrovascular accident) 2. Carotid atherosclerosis, bilateral - patient with h/o CVA and carotid plaques seen on dental XRay - reviewed last Carotid US - will repeat Carotid dopplers b/l to ensure no significant progression - not taking a statin, though he likely needs to for secondary prevention - will reassess statin pending carotid atherosclerosis - US Carotid Duplex Bilateral; Future  3. Weight loss - suspect this is related to diet change - will continue to monitor to ensure its not pathologic   Return if symptoms worsen or fail to improve.   The entirety of the information documented in the History of Present Illness, Review of Systems and Physical Exam were personally obtained by me. Portions of this information were initially documented by Ashley Royalty, CMA and reviewed by me for thoroughness and  accuracy.    [name], MD, MPH [facility] 04/29/2018 10:47 AM

## 2018-04-30 ENCOUNTER — Telehealth: Payer: Self-pay | Admitting: Family Medicine

## 2018-04-30 NOTE — Telephone Encounter (Signed)
Patient's wife [name] advised [place] has been ordered and she will be getting a call from [facility] with appointment date and time.

## 2018-04-30 NOTE — Telephone Encounter (Signed)
Pt's wife called saying when they yesterday Dr. B told them she would be ordering an [place] of his neck for carotid doppler.  She is calling back to see if the order has been placed yet.  CB#  [phone]

## 2018-05-04 ENCOUNTER — Ambulatory Visit
Admission: RE | Admit: 2018-05-04 | Discharge: 2018-05-04 | Disposition: A | Discharge status: Home / Self Care | Payer: Medicare Other | Source: Ambulatory Visit | Attending: Family Medicine | Admitting: Family Medicine

## 2018-05-04 ENCOUNTER — Telehealth: Payer: Self-pay | Admitting: Family Medicine

## 2018-05-04 DIAGNOSIS — Z8673 Personal history of transient ischemic attack (TIA), and cerebral infarction without residual deficits: Secondary | ICD-10-CM | POA: Diagnosis not present

## 2018-05-04 DIAGNOSIS — I6523 Occlusion and stenosis of bilateral carotid arteries: Secondary | ICD-10-CM | POA: Diagnosis not present

## 2018-05-04 DIAGNOSIS — R296 Repeated falls: Secondary | ICD-10-CM

## 2018-05-04 DIAGNOSIS — I6522 Occlusion and stenosis of left carotid artery: Secondary | ICD-10-CM | POA: Diagnosis not present

## 2018-05-04 NOTE — Telephone Encounter (Signed)
Pt's wife stated due to increase in pt falling she is requesting Dr. B send orders for pt to do physical therapy at [facility] again. Please advise. Thanks TNP

## 2018-05-05 NOTE — Telephone Encounter (Signed)
Order placed for PT at [place], [place], MPH [facility] 05/05/2018 8:18 AM

## 2018-05-05 NOTE — Telephone Encounter (Signed)
Left patient's wife [name] a voicemail advising her that referral has been placed.

## 2018-05-13 DIAGNOSIS — R2681 Unsteadiness on feet: Secondary | ICD-10-CM | POA: Diagnosis not present

## 2018-05-15 DIAGNOSIS — R2681 Unsteadiness on feet: Secondary | ICD-10-CM | POA: Diagnosis not present

## 2018-05-18 DIAGNOSIS — R2681 Unsteadiness on feet: Secondary | ICD-10-CM | POA: Diagnosis not present

## 2018-05-19 ENCOUNTER — Telehealth: Payer: Self-pay | Admitting: Family Medicine

## 2018-05-19 NOTE — Telephone Encounter (Signed)
I think PT is more appropriate for his symptoms and falls than OT.  PT can help decide if OT would be useful also  Diannah Rindfleisch, Dionne Bucy, MD, MPH [facility] 05/19/2018 11:36 AM

## 2018-05-19 NOTE — Telephone Encounter (Signed)
Pt's wife returning missed call.  Not sure what the call was about. Pt's wife also checked on the recent referral.  Wanted the referral to be for OT because of concussion from fall.  Please call pt's wife back to discuss.  Thanks, [employer]

## 2018-05-19 NOTE — Telephone Encounter (Signed)
Patient's wife [name] advised.

## 2018-05-20 DIAGNOSIS — R2681 Unsteadiness on feet: Secondary | ICD-10-CM | POA: Diagnosis not present

## 2018-05-21 ENCOUNTER — Ambulatory Visit (INDEPENDENT_AMBULATORY_CARE_PROVIDER_SITE_OTHER): Payer: Medicare Other | Admitting: Family Medicine

## 2018-05-21 ENCOUNTER — Encounter: Payer: Self-pay | Admitting: Family Medicine

## 2018-05-21 VITALS — BP 138/68 | HR 70 | Temp 97.6°F | Wt 162.4 lb

## 2018-05-21 DIAGNOSIS — I10 Essential (primary) hypertension: Secondary | ICD-10-CM | POA: Diagnosis not present

## 2018-05-21 DIAGNOSIS — R7303 Prediabetes: Secondary | ICD-10-CM

## 2018-05-21 DIAGNOSIS — E7800 Pure hypercholesterolemia, unspecified: Secondary | ICD-10-CM

## 2018-05-21 DIAGNOSIS — R251 Tremor, unspecified: Secondary | ICD-10-CM | POA: Diagnosis not present

## 2018-05-21 DIAGNOSIS — F3342 Major depressive disorder, recurrent, in full remission: Secondary | ICD-10-CM

## 2018-05-21 DIAGNOSIS — D692 Other nonthrombocytopenic purpura: Secondary | ICD-10-CM | POA: Insufficient documentation

## 2018-05-21 DIAGNOSIS — R296 Repeated falls: Secondary | ICD-10-CM | POA: Diagnosis not present

## 2018-05-21 DIAGNOSIS — Z8673 Personal history of transient ischemic attack (TIA), and cerebral infarction without residual deficits: Secondary | ICD-10-CM

## 2018-05-21 MED ORDER — AMLODIPINE BESYLATE 5 MG PO TABS: 5.0000 mg | ORAL_TABLET | Freq: Every day | ORAL | 3 refills | Status: DC

## 2018-05-21 MED ORDER — ESCITALOPRAM OXALATE 10 MG PO TABS: 10.0000 mg | ORAL_TABLET | Freq: Every day | ORAL | 3 refills | Status: DC

## 2018-05-21 MED ORDER — HYDROCHLOROTHIAZIDE 12.5 MG PO CAPS: 12.5000 mg | ORAL_CAPSULE | Freq: Every day | ORAL | 3 refills | Status: DC

## 2018-05-21 NOTE — Assessment & Plan Note (Signed)
Well controlled Continue LExapro F/u in 6 months

## 2018-05-21 NOTE — Assessment & Plan Note (Signed)
Discussed importance of A1c, BP, and cholesterol control

## 2018-05-21 NOTE — Assessment & Plan Note (Signed)
Discussed safety measures Continue PT Referral for OT as well

## 2018-05-21 NOTE — Assessment & Plan Note (Signed)
Not on a statin for several years Recheck LFTs and lipid panel

## 2018-05-21 NOTE — Assessment & Plan Note (Signed)
A1c previously well controlled Recheck A1c

## 2018-05-21 NOTE — Assessment & Plan Note (Signed)
Well controlled Continue amlodipine 5mg  daily and HCTZ 12.5mg  daily Reviewed recent BMP F/u in 6 months

## 2018-05-21 NOTE — Assessment & Plan Note (Signed)
Noted on exam ° °

## 2018-05-21 NOTE — Patient Instructions (Signed)
Fall Prevention in the Home Falls can cause injuries and can affect people from all age groups. There are many simple things that you can do to make your home safe and to help prevent falls. What can I do on the outside of my home?  Regularly repair the edges of walkways and driveways and fix any cracks.  Remove high doorway thresholds.  Trim any shrubbery on the main path into your home.  Use bright outdoor lighting.  Clear walkways of debris and clutter, including tools and rocks.  Regularly check that handrails are securely fastened and in good repair. Both sides of any steps should have handrails.  Install guardrails along the edges of any raised decks or porches.  Have leaves, snow, and ice cleared regularly.  Use sand or salt on walkways during winter months.  In the garage, clean up any spills right away, including grease or oil spills. What can I do in the bathroom?  Use night lights.  Install grab bars by the toilet and in the tub and shower. Do not use towel bars as grab bars.  Use non-skid mats or decals on the floor of the tub or shower.  If you need to sit down while you are in the shower, use a plastic, non-slip stool.  Keep the floor dry. Immediately clean up any water that spills on the floor.  Remove soap buildup in the tub or shower on a regular basis.  Attach bath mats securely with double-sided non-slip rug tape.  Remove throw rugs and other tripping hazards from the floor. What can I do in the bedroom?  Use night lights.  Make sure that a bedside light is easy to reach.  Do not use oversized bedding that drapes onto the floor.  Have a firm chair that has side arms to use for getting dressed.  Remove throw rugs and other tripping hazards from the floor. What can I do in the kitchen?  Clean up any spills right away.  Avoid walking on wet floors.  Place frequently used items in easy-to-reach places.  If you need to reach for something above  you, use a sturdy step stool that has a grab bar.  Keep electrical cables out of the way.  Do not use floor polish or wax that makes floors slippery. If you have to use wax, make sure that it is non-skid floor wax.  Remove throw rugs and other tripping hazards from the floor. What can I do in the stairways?  Do not leave any items on the stairs.  Make sure that there are handrails on both sides of the stairs. Fix handrails that are broken or loose. Make sure that handrails are as long as the stairways.  Check any carpeting to make sure that it is firmly attached to the stairs. Fix any carpet that is loose or worn.  Avoid having throw rugs at the top or bottom of stairways, or secure the rugs with carpet tape to prevent them from moving.  Make sure that you have a light switch at the top of the stairs and the bottom of the stairs. If you do not have them, have them installed. What are some other fall prevention tips?  Wear closed-toe shoes that fit well and support your feet. Wear shoes that have rubber soles or low heels.  When you use a stepladder, make sure that it is completely opened and that the sides are firmly locked. Have someone hold the ladder while you are using   it. Do not climb a closed stepladder.  Add color or contrast paint or tape to grab bars and handrails in your home. Place contrasting color strips on the first and last steps.  Use mobility aids as needed, such as canes, walkers, scooters, and crutches.  Turn on lights if it is dark. Replace any light bulbs that burn out.  Set up furniture so that there are clear paths. Keep the furniture in the same spot.  Fix any uneven floor surfaces.  Choose a carpet design that does not hide the edge of steps of a stairway.  Be aware of any and all pets.  Review your medicines with your healthcare provider. Some medicines can cause dizziness or changes in blood pressure, which increase your risk of falling. Talk with  your health care provider about other ways that you can decrease your risk of falls. This may include working with a physical therapist or trainer to improve your strength, balance, and endurance. This information is not intended to replace advice given to you by your health care provider. Make sure you discuss any questions you have with your health care provider. Document Released: 06/14/2002 Document Revised: 11/21/2015 Document Reviewed: 07/29/2014 Elsevier Interactive Patient Education  2018 Elsevier Inc.  

## 2018-05-21 NOTE — Progress Notes (Signed)
Patient: [name] Male    DOB: 1925-03-26   84 y.o.   MRN: [id number] Visit Date: 05/21/2018  Today's Provider: Lavon Paganini, MD   Chief Complaint  Patient presents with  . Hypertension  . Hyperlipidemia  . Pre-Diabetes   Subjective:    HPI  Hypertension, follow-up:  BP Readings from Last 3 Encounters:  05/21/18 138/68  04/29/18 (!) 144/72  04/11/18 139/70    He was last seen for hypertension 6 months ago.  BP at that visit was 150/70. Management changes since that visit include discontinue Losartan and increase Amlodipine to 5 mg. He reports good compliance with treatment. He is not having side effects.  He is not exercising. He is adherent to low salt diet.   Outside blood pressures are being checked. He is experiencing none.  Patient denies chest pain, chest pressure/discomfort, claudication, dyspnea, exertional chest pressure/discomfort, fatigue, irregular heart beat, lower extremity edema, near-syncope, orthopnea, palpitations, paroxysmal nocturnal dyspnea, syncope and tachypnea.   Cardiovascular risk factors include advanced age (older than 2 for men, 68 for women), hypertension and male gender.  Use of agents associated with hypertension: none.     Weight trend: stable Wt Readings from Last 3 Encounters:  05/21/18 162 lb 6.4 oz (73.7 kg)  04/29/18 165 lb (74.8 kg)  04/11/18 170 lb (77.1 kg)    Current diet: low salt  ------------------------------------------------------------------------  Prediabetes, Follow-up:   Lab Results  Component Value Date   HGBA1C 6.0 09/15/2017   HGBA1C 5.9 03/31/2017   HGBA1C 5.9 05/09/2016   GLUCOSE 116 (H) 04/11/2018   GLUCOSE 97 11/17/2017   GLUCOSE 100 (H) 09/15/2017    Last seen for for this6 months ago.  Management since that visit includes no changes. Current symptoms include none   Weight trend: stable Current diet: low salt Current exercise: none  Pertinent Labs:    Component Value  Date/Time   CHOL 173 04/01/2017 1330   CHOL 147 09/15/2015 1043   TRIG 103 04/01/2017 1330   CHOLHDL 2.8 04/01/2017 1330   CREATININE 1.06 04/11/2018 1452   CREATININE 1.00 04/25/2012 1258    Wt Readings from Last 3 Encounters:  05/21/18 162 lb 6.4 oz (73.7 kg)  04/29/18 165 lb (74.8 kg)  04/11/18 170 lb (77.1 kg)   HLD - medications: none, was previously on statin and raised blood sugar   Patinet and his wife are concerned about need for OT.  He has had frequent falls.  Most recent fell and [facility] and had scalp hematoma 04/11/18.  He has been doing PT and trying to remove fall hazards in the home.  He also has tremor, history CVA.  Notes that due to decreased strength in upper body, it is difficult to write and eat.  Depression: States he is doing well on Lexapro 10mg  daily.  Denies any medication SEs.  Was depression after his fall by his inability to do things independently, but has been doing well since.     Allergies  Allergen Reactions  . Toviaz [Fesoterodine Fumarate Er] Other (See Comments)    Acid reflux     Current Outpatient Medications:  .  amLODipine (NORVASC) 5 MG tablet, Take 1 tablet (5 mg total) by mouth daily., Disp: 90 tablet, Rfl: 3 .  aspirin EC 81 MG tablet, Take 81 mg by mouth daily., Disp: , Rfl:  .  Bioflavonoid Products (ESTER C PO), Take 2 tablets by mouth daily., Disp: , Rfl:  .  Emollient (CERAVE) CREA,  Apply 1 application topically 2 (two) times daily as needed (itching). , Disp: , Rfl:  .  escitalopram (LEXAPRO) 10 MG tablet, Take 1 tablet (10 mg total) by mouth at bedtime., Disp: 90 tablet, Rfl: 3 .  escitalopram (LEXAPRO) 5 MG tablet, Take 5 mg by mouth at bedtime. , Disp: , Rfl:  .  fluticasone (FLONASE) 50 MCG/ACT nasal spray, Place 2 sprays into both nostrils daily. (Patient taking differently: Place 1 spray into both nostrils 2 (two) times daily. ), Disp: 16 g, Rfl: 5 .  folic acid (FOLVITE) 1 MG tablet, Take 1 mg by mouth See admin  instructions. Take 1 tablet (1 mg) by mouth daily at bedtime except on Saturday, Disp: , Rfl:  .  hydrochlorothiazide (MICROZIDE) 12.5 MG capsule, TAKE 1 CAPSULE BY MOUTH ONCE DAILY (Patient taking differently: Take 12.5 mg by mouth daily. ), Disp: 90 capsule, Rfl: 3 .  HYDROcodone-acetaminophen (NORCO/VICODIN) 5-325 MG tablet, Take 1-2 tablets by mouth every 6 (six) hours as needed., Disp: 10 tablet, Rfl: 0 .  hydrOXYzine (ATARAX/VISTARIL) 10 MG tablet, Take 0.5-1 tablets (5-10 mg total) by mouth at bedtime as needed., Disp: 10 tablet, Rfl: 0 .  methotrexate (RHEUMATREX) 2.5 MG tablet, Take 10 mg by mouth every Saturday. , Disp: , Rfl:  .  naproxen sodium (ALEVE) 220 MG tablet, Take 220 mg by mouth daily as needed (pain/headache)., Disp: , Rfl:  .  OVER THE COUNTER MEDICATION, Take 1 packet by mouth 2 (two) times daily. Vitamin packet from [facility] for circulation - each packet contains 5 tablets, Disp: , Rfl:  .  Polyethyl Glycol-Propyl Glycol (SYSTANE OP), Place 1 drop into both eyes 2 (two) times daily., Disp: , Rfl:  .  polyethylene glycol powder (GLYCOLAX/MIRALAX) powder, Take 17 g by mouth daily., Disp: 255 g, Rfl: 11 .  tamsulosin (FLOMAX) 0.4 MG CAPS capsule, TAKE ONE CAPSULE BY MOUTH ONCE DAILY (Patient taking differently: Take 0.4 mg by mouth daily. ), Disp: 90 capsule, Rfl: 3 .  triamcinolone ointment (KENALOG) 0.5 %, Apply 1 application topically 2 (two) times daily., Disp: 60 g, Rfl: 3 .  Zinc 50 MG TABS, Take 50 mg by mouth daily. , Disp: , Rfl:   Review of Systems  Constitutional: Negative.   HENT: Negative.   Respiratory: Negative.   Cardiovascular: Negative.   Endocrine: Negative.   Musculoskeletal: Negative.   Neurological: Negative.   Psychiatric/Behavioral: Negative.     Social History   Tobacco Use  . Smoking status: Former Smoker    Years: 10.00    Last attempt to quit: 07/08/1962    Years since quitting: 55.9  . Smokeless tobacco: Never Used  . Tobacco comment:  quit 1964  Substance Use Topics  . Alcohol use: No   Objective:   BP 138/68 (BP Location: Left Arm, Patient Position: Sitting, Cuff Size: Normal)   Pulse 70   Temp 97.6 F (36.4 C) (Oral)   Wt 162 lb 6.4 oz (73.7 kg)   SpO2 99%   BMI 23.98 kg/m  Vitals:   05/21/18 1320  BP: 138/68  Pulse: 70  Temp: 97.6 F (36.4 C)  TempSrc: Oral  SpO2: 99%  Weight: 162 lb 6.4 oz (73.7 kg)     Physical Exam  Constitutional: He is oriented to person, place, and time. He appears well-developed and well-nourished. No distress.  HENT:  Head: Normocephalic.  Right Ear: External ear normal.  Left Ear: External ear normal.  Nose: Nose normal.  Mouth/Throat: Oropharynx is clear and  moist. No oropharyngeal exudate.  Small bruise over L temple area  Eyes: Pupils are equal, round, and reactive to light. Right eye exhibits no discharge. No scleral icterus.  Neck: Neck supple. No thyromegaly present.  Cardiovascular: Normal rate, regular rhythm, normal heart sounds and intact distal pulses.  No murmur heard. Pulmonary/Chest: Effort normal and breath sounds normal. No respiratory distress. He has no wheezes. He has no rales.  Musculoskeletal: He exhibits no edema.  Lymphadenopathy:    He has no cervical adenopathy.  Neurological: He is alert and oriented to person, place, and time.  Skin: Skin is warm and dry. Capillary refill takes less than 2 seconds. No rash noted.  +senile purpura  Psychiatric: He has a normal mood and affect. His behavior is normal.  Vitals reviewed.       Assessment & Plan:   Problem List Items Addressed This Visit      Cardiovascular and Mediastinum   Benign essential HTN - Primary    Well controlled Continue amlodipine 5mg  daily and HCTZ 12.5mg  daily Reviewed recent BMP F/u in 6 months      Relevant Medications   amLODipine (NORVASC) 5 MG tablet   hydrochlorothiazide (MICROZIDE) 12.5 MG capsule   Senile purpura (HCC)    Noted on exam      Relevant  Medications   amLODipine (NORVASC) 5 MG tablet   hydrochlorothiazide (MICROZIDE) 12.5 MG capsule     Other   Prediabetes    A1c previously well controlled Recheck A1c      Relevant Orders   Hemoglobin A1c   H/O: CVA (cerebrovascular accident)    Discussed importance of A1c, BP, and cholesterol control      Relevant Orders   Ambulatory referral to Occupational Therapy   Lipid panel   Hypercholesterolemia    Not on a statin for several years Recheck LFTs and lipid panel      Relevant Medications   amLODipine (NORVASC) 5 MG tablet   hydrochlorothiazide (MICROZIDE) 12.5 MG capsule   Other Relevant Orders   Lipid panel   Hepatic function panel   Frequent falls    Discussed safety measures Continue PT Referral for OT as well      Relevant Orders   Ambulatory referral to Occupational Therapy   MDD (major depressive disorder)    Well controlled Continue LExapro F/u in 6 months      Relevant Medications   escitalopram (LEXAPRO) 10 MG tablet    Other Visit Diagnoses    Tremor       Relevant Orders   Ambulatory referral to Occupational Therapy       Return in about 6 months (around 11/19/2018) for awv/6 m f/u.   The entirety of the information documented in the History of Present Illness, Review of Systems and Physical Exam were personally obtained by me. Portions of this information were initially documented by Tiburcio Pea, CMA and reviewed by me for thoroughness and accuracy.    [name], MD, MPH [facility] 05/21/2018 2:21 PM

## 2018-05-22 ENCOUNTER — Telehealth: Payer: Self-pay

## 2018-05-22 DIAGNOSIS — R2681 Unsteadiness on feet: Secondary | ICD-10-CM | POA: Diagnosis not present

## 2018-05-22 LAB — HEPATIC FUNCTION PANEL
ALBUMIN: 4.1 g/dL (ref 3.2–4.6)
ALT: 19 IU/L (ref 0–44)
AST: 25 IU/L (ref 0–40)
Alkaline Phosphatase: 72 IU/L (ref 39–117)
BILIRUBIN TOTAL: 0.3 mg/dL (ref 0.0–1.2)
BILIRUBIN, DIRECT: 0.13 mg/dL (ref 0.00–0.40)
Total Protein: 6.3 g/dL (ref 6.0–8.5)

## 2018-05-22 LAB — LIPID PANEL
CHOL/HDL RATIO: 2.5 ratio (ref 0.0–5.0)
CHOLESTEROL TOTAL: 158 mg/dL (ref 100–199)
HDL: 62 mg/dL (ref >39)
LDL Calculated: 78 mg/dL (ref 0–99)
TRIGLYCERIDES: 89 mg/dL (ref 0–149)
VLDL Cholesterol Cal: 18 mg/dL (ref 5–40)

## 2018-05-22 LAB — HEMOGLOBIN A1C
Est. average glucose Bld gHb Est-mCnc: 108 mg/dL
Hgb A1c MFr Bld: 5.4 % (ref 4.8–5.6)

## 2018-05-22 NOTE — Telephone Encounter (Signed)
-----   Message from [name], MD sent at 05/22/2018 10:25 AM EST ----- Stable liver function and cholesterol.  A1c is now in normal range  Brita Romp, Dionne Bucy, MD, MPH [facility] 05/22/2018 10:25 AM

## 2018-05-22 NOTE — Telephone Encounter (Signed)
LMTCB

## 2018-05-25 DIAGNOSIS — R2681 Unsteadiness on feet: Secondary | ICD-10-CM | POA: Diagnosis not present

## 2018-05-25 NOTE — Telephone Encounter (Signed)
Pt returned missed call. Pt needing a call back for labs.  Please call after 2:30 today.  Thanks, [employer]

## 2018-05-25 NOTE — Telephone Encounter (Signed)
LMTCB

## 2018-05-25 NOTE — Telephone Encounter (Signed)
Patient returned call on 05/22/2018 and left a message with the after hours service requesting a call back.

## 2018-05-25 NOTE — Telephone Encounter (Signed)
Patient and his wife [name] advised.

## 2018-05-27 DIAGNOSIS — R2681 Unsteadiness on feet: Secondary | ICD-10-CM | POA: Diagnosis not present

## 2018-05-29 DIAGNOSIS — R2681 Unsteadiness on feet: Secondary | ICD-10-CM | POA: Diagnosis not present

## 2018-06-01 DIAGNOSIS — R2681 Unsteadiness on feet: Secondary | ICD-10-CM | POA: Diagnosis not present

## 2018-06-03 DIAGNOSIS — R2681 Unsteadiness on feet: Secondary | ICD-10-CM | POA: Diagnosis not present

## 2018-06-05 DIAGNOSIS — R2681 Unsteadiness on feet: Secondary | ICD-10-CM | POA: Diagnosis not present

## 2018-06-08 ENCOUNTER — Other Ambulatory Visit: Payer: Self-pay | Admitting: Family Medicine

## 2018-06-08 DIAGNOSIS — C61 Malignant neoplasm of prostate: Secondary | ICD-10-CM

## 2018-06-09 ENCOUNTER — Other Ambulatory Visit: Payer: Medicare Other

## 2018-06-09 DIAGNOSIS — R2681 Unsteadiness on feet: Secondary | ICD-10-CM | POA: Diagnosis not present

## 2018-06-09 DIAGNOSIS — M1990 Unspecified osteoarthritis, unspecified site: Secondary | ICD-10-CM | POA: Diagnosis not present

## 2018-06-09 DIAGNOSIS — R278 Other lack of coordination: Secondary | ICD-10-CM | POA: Diagnosis not present

## 2018-06-09 DIAGNOSIS — R4189 Other symptoms and signs involving cognitive functions and awareness: Secondary | ICD-10-CM | POA: Diagnosis not present

## 2018-06-09 DIAGNOSIS — M6281 Muscle weakness (generalized): Secondary | ICD-10-CM | POA: Diagnosis not present

## 2018-06-10 DIAGNOSIS — R4189 Other symptoms and signs involving cognitive functions and awareness: Secondary | ICD-10-CM | POA: Diagnosis not present

## 2018-06-10 DIAGNOSIS — R2681 Unsteadiness on feet: Secondary | ICD-10-CM | POA: Diagnosis not present

## 2018-06-10 DIAGNOSIS — M6281 Muscle weakness (generalized): Secondary | ICD-10-CM | POA: Diagnosis not present

## 2018-06-10 DIAGNOSIS — M1990 Unspecified osteoarthritis, unspecified site: Secondary | ICD-10-CM | POA: Diagnosis not present

## 2018-06-10 DIAGNOSIS — R278 Other lack of coordination: Secondary | ICD-10-CM | POA: Diagnosis not present

## 2018-06-11 ENCOUNTER — Ambulatory Visit: Payer: Medicare Other | Admitting: Urology

## 2018-06-11 DIAGNOSIS — L308 Other specified dermatitis: Secondary | ICD-10-CM | POA: Diagnosis not present

## 2018-06-12 DIAGNOSIS — M6281 Muscle weakness (generalized): Secondary | ICD-10-CM | POA: Diagnosis not present

## 2018-06-12 DIAGNOSIS — R2681 Unsteadiness on feet: Secondary | ICD-10-CM | POA: Diagnosis not present

## 2018-06-12 DIAGNOSIS — R278 Other lack of coordination: Secondary | ICD-10-CM | POA: Diagnosis not present

## 2018-06-12 DIAGNOSIS — M1990 Unspecified osteoarthritis, unspecified site: Secondary | ICD-10-CM | POA: Diagnosis not present

## 2018-06-12 DIAGNOSIS — R4189 Other symptoms and signs involving cognitive functions and awareness: Secondary | ICD-10-CM | POA: Diagnosis not present

## 2018-06-15 DIAGNOSIS — M6281 Muscle weakness (generalized): Secondary | ICD-10-CM | POA: Diagnosis not present

## 2018-06-15 DIAGNOSIS — R4189 Other symptoms and signs involving cognitive functions and awareness: Secondary | ICD-10-CM | POA: Diagnosis not present

## 2018-06-15 DIAGNOSIS — R278 Other lack of coordination: Secondary | ICD-10-CM | POA: Diagnosis not present

## 2018-06-15 DIAGNOSIS — R2681 Unsteadiness on feet: Secondary | ICD-10-CM | POA: Diagnosis not present

## 2018-06-15 DIAGNOSIS — M1990 Unspecified osteoarthritis, unspecified site: Secondary | ICD-10-CM | POA: Diagnosis not present

## 2018-06-17 DIAGNOSIS — R278 Other lack of coordination: Secondary | ICD-10-CM | POA: Diagnosis not present

## 2018-06-17 DIAGNOSIS — R4189 Other symptoms and signs involving cognitive functions and awareness: Secondary | ICD-10-CM | POA: Diagnosis not present

## 2018-06-17 DIAGNOSIS — R2681 Unsteadiness on feet: Secondary | ICD-10-CM | POA: Diagnosis not present

## 2018-06-17 DIAGNOSIS — M1990 Unspecified osteoarthritis, unspecified site: Secondary | ICD-10-CM | POA: Diagnosis not present

## 2018-06-17 DIAGNOSIS — M6281 Muscle weakness (generalized): Secondary | ICD-10-CM | POA: Diagnosis not present

## 2018-06-19 DIAGNOSIS — R4189 Other symptoms and signs involving cognitive functions and awareness: Secondary | ICD-10-CM | POA: Diagnosis not present

## 2018-06-19 DIAGNOSIS — M6281 Muscle weakness (generalized): Secondary | ICD-10-CM | POA: Diagnosis not present

## 2018-06-19 DIAGNOSIS — R278 Other lack of coordination: Secondary | ICD-10-CM | POA: Diagnosis not present

## 2018-06-19 DIAGNOSIS — M1990 Unspecified osteoarthritis, unspecified site: Secondary | ICD-10-CM | POA: Diagnosis not present

## 2018-06-19 DIAGNOSIS — R2681 Unsteadiness on feet: Secondary | ICD-10-CM | POA: Diagnosis not present

## 2018-06-22 DIAGNOSIS — R4189 Other symptoms and signs involving cognitive functions and awareness: Secondary | ICD-10-CM | POA: Diagnosis not present

## 2018-06-22 DIAGNOSIS — R2681 Unsteadiness on feet: Secondary | ICD-10-CM | POA: Diagnosis not present

## 2018-06-22 DIAGNOSIS — M6281 Muscle weakness (generalized): Secondary | ICD-10-CM | POA: Diagnosis not present

## 2018-06-22 DIAGNOSIS — R278 Other lack of coordination: Secondary | ICD-10-CM | POA: Diagnosis not present

## 2018-06-22 DIAGNOSIS — M1990 Unspecified osteoarthritis, unspecified site: Secondary | ICD-10-CM | POA: Diagnosis not present

## 2018-06-24 DIAGNOSIS — R278 Other lack of coordination: Secondary | ICD-10-CM | POA: Diagnosis not present

## 2018-06-24 DIAGNOSIS — R2681 Unsteadiness on feet: Secondary | ICD-10-CM | POA: Diagnosis not present

## 2018-06-24 DIAGNOSIS — M1990 Unspecified osteoarthritis, unspecified site: Secondary | ICD-10-CM | POA: Diagnosis not present

## 2018-06-24 DIAGNOSIS — R4189 Other symptoms and signs involving cognitive functions and awareness: Secondary | ICD-10-CM | POA: Diagnosis not present

## 2018-06-24 DIAGNOSIS — M6281 Muscle weakness (generalized): Secondary | ICD-10-CM | POA: Diagnosis not present

## 2018-06-26 ENCOUNTER — Other Ambulatory Visit: Payer: Self-pay | Admitting: Family Medicine

## 2018-06-26 DIAGNOSIS — I10 Essential (primary) hypertension: Secondary | ICD-10-CM

## 2018-06-26 DIAGNOSIS — F3342 Major depressive disorder, recurrent, in full remission: Secondary | ICD-10-CM

## 2018-06-26 DIAGNOSIS — R2681 Unsteadiness on feet: Secondary | ICD-10-CM | POA: Diagnosis not present

## 2018-06-26 DIAGNOSIS — M6281 Muscle weakness (generalized): Secondary | ICD-10-CM | POA: Diagnosis not present

## 2018-06-26 DIAGNOSIS — R4189 Other symptoms and signs involving cognitive functions and awareness: Secondary | ICD-10-CM | POA: Diagnosis not present

## 2018-06-26 DIAGNOSIS — R278 Other lack of coordination: Secondary | ICD-10-CM | POA: Diagnosis not present

## 2018-06-26 DIAGNOSIS — M1990 Unspecified osteoarthritis, unspecified site: Secondary | ICD-10-CM | POA: Diagnosis not present

## 2018-06-26 DIAGNOSIS — L309 Dermatitis, unspecified: Secondary | ICD-10-CM

## 2018-06-26 MED ORDER — HYDROCHLOROTHIAZIDE 12.5 MG PO CAPS: 12.5000 mg | ORAL_CAPSULE | Freq: Every day | ORAL | 3 refills | Status: DC

## 2018-06-26 MED ORDER — ESCITALOPRAM OXALATE 10 MG PO TABS: 10.0000 mg | ORAL_TABLET | Freq: Every day | ORAL | 3 refills | Status: DC

## 2018-06-26 MED ORDER — AMLODIPINE BESYLATE 5 MG PO TABS: 5.0000 mg | ORAL_TABLET | Freq: Every day | ORAL | 3 refills | Status: DC

## 2018-06-26 MED ORDER — FLUTICASONE PROPIONATE 50 MCG/ACT NA SUSP: 1.0000 | Freq: Two times a day (BID) | NASAL | 5 refills | Status: DC

## 2018-06-26 NOTE — Telephone Encounter (Signed)
Patient's wife advised as directed below. 

## 2018-06-26 NOTE — Telephone Encounter (Signed)
Patient's wife called stating that [name] has lost all of his medications including OTC medications.   He needs these called into [facility] on [address].   ASAP as he has not taken them in 2-3 days.  Also [name] needs to know the name of his OTC medications so she can buy those also.  They do not have a list of any of his medications.

## 2018-06-26 NOTE — Telephone Encounter (Signed)
Refilled the meds we fill. [name] fills his flomax, Not sure which rheum fills his methotrexate.  Does he need triamcinolone cream or miralax as they have not been filled recently (both 2018)?  OTC meds are zinc 50mg , aleve 200mg  prn, cerave cream, ester c (bioflavonoid), aspirin 81mg 

## 2018-06-26 NOTE — Telephone Encounter (Signed)
This is a Dr.B patient. Can you help with this?

## 2018-06-29 DIAGNOSIS — R4189 Other symptoms and signs involving cognitive functions and awareness: Secondary | ICD-10-CM | POA: Diagnosis not present

## 2018-06-29 DIAGNOSIS — M1990 Unspecified osteoarthritis, unspecified site: Secondary | ICD-10-CM | POA: Diagnosis not present

## 2018-06-29 DIAGNOSIS — R2681 Unsteadiness on feet: Secondary | ICD-10-CM | POA: Diagnosis not present

## 2018-06-29 DIAGNOSIS — M6281 Muscle weakness (generalized): Secondary | ICD-10-CM | POA: Diagnosis not present

## 2018-06-29 DIAGNOSIS — R278 Other lack of coordination: Secondary | ICD-10-CM | POA: Diagnosis not present

## 2018-07-02 DIAGNOSIS — R278 Other lack of coordination: Secondary | ICD-10-CM | POA: Diagnosis not present

## 2018-07-02 DIAGNOSIS — M1990 Unspecified osteoarthritis, unspecified site: Secondary | ICD-10-CM | POA: Diagnosis not present

## 2018-07-02 DIAGNOSIS — R4189 Other symptoms and signs involving cognitive functions and awareness: Secondary | ICD-10-CM | POA: Diagnosis not present

## 2018-07-02 DIAGNOSIS — R2681 Unsteadiness on feet: Secondary | ICD-10-CM | POA: Diagnosis not present

## 2018-07-02 DIAGNOSIS — M6281 Muscle weakness (generalized): Secondary | ICD-10-CM | POA: Diagnosis not present

## 2018-07-03 DIAGNOSIS — M1990 Unspecified osteoarthritis, unspecified site: Secondary | ICD-10-CM | POA: Diagnosis not present

## 2018-07-03 DIAGNOSIS — M6281 Muscle weakness (generalized): Secondary | ICD-10-CM | POA: Diagnosis not present

## 2018-07-03 DIAGNOSIS — R2681 Unsteadiness on feet: Secondary | ICD-10-CM | POA: Diagnosis not present

## 2018-07-03 DIAGNOSIS — R4189 Other symptoms and signs involving cognitive functions and awareness: Secondary | ICD-10-CM | POA: Diagnosis not present

## 2018-07-03 DIAGNOSIS — R278 Other lack of coordination: Secondary | ICD-10-CM | POA: Diagnosis not present

## 2018-07-06 DIAGNOSIS — R4189 Other symptoms and signs involving cognitive functions and awareness: Secondary | ICD-10-CM | POA: Diagnosis not present

## 2018-07-06 DIAGNOSIS — R2681 Unsteadiness on feet: Secondary | ICD-10-CM | POA: Diagnosis not present

## 2018-07-06 DIAGNOSIS — R278 Other lack of coordination: Secondary | ICD-10-CM | POA: Diagnosis not present

## 2018-07-06 DIAGNOSIS — M1990 Unspecified osteoarthritis, unspecified site: Secondary | ICD-10-CM | POA: Diagnosis not present

## 2018-07-06 DIAGNOSIS — M6281 Muscle weakness (generalized): Secondary | ICD-10-CM | POA: Diagnosis not present

## 2018-07-07 DIAGNOSIS — M6281 Muscle weakness (generalized): Secondary | ICD-10-CM | POA: Diagnosis not present

## 2018-07-07 DIAGNOSIS — R4189 Other symptoms and signs involving cognitive functions and awareness: Secondary | ICD-10-CM | POA: Diagnosis not present

## 2018-07-07 DIAGNOSIS — R2681 Unsteadiness on feet: Secondary | ICD-10-CM | POA: Diagnosis not present

## 2018-07-07 DIAGNOSIS — M1990 Unspecified osteoarthritis, unspecified site: Secondary | ICD-10-CM | POA: Diagnosis not present

## 2018-07-07 DIAGNOSIS — R278 Other lack of coordination: Secondary | ICD-10-CM | POA: Diagnosis not present

## 2018-07-08 DIAGNOSIS — R278 Other lack of coordination: Secondary | ICD-10-CM | POA: Diagnosis not present

## 2018-07-08 DIAGNOSIS — R2681 Unsteadiness on feet: Secondary | ICD-10-CM | POA: Diagnosis not present

## 2018-07-08 DIAGNOSIS — M1990 Unspecified osteoarthritis, unspecified site: Secondary | ICD-10-CM | POA: Diagnosis not present

## 2018-07-08 DIAGNOSIS — R4189 Other symptoms and signs involving cognitive functions and awareness: Secondary | ICD-10-CM | POA: Diagnosis not present

## 2018-07-08 DIAGNOSIS — R296 Repeated falls: Secondary | ICD-10-CM | POA: Diagnosis not present

## 2018-07-08 DIAGNOSIS — M6281 Muscle weakness (generalized): Secondary | ICD-10-CM | POA: Diagnosis not present

## 2018-07-09 ENCOUNTER — Encounter: Payer: Self-pay | Admitting: Physician Assistant

## 2018-07-09 ENCOUNTER — Ambulatory Visit
Admission: RE | Admit: 2018-07-09 | Discharge: 2018-07-09 | Disposition: A | Discharge status: Home / Self Care | Payer: Medicare Other | Attending: Physician Assistant | Admitting: Physician Assistant

## 2018-07-09 ENCOUNTER — Ambulatory Visit
Admission: RE | Admit: 2018-07-09 | Discharge: 2018-07-09 | Disposition: A | Discharge status: Home / Self Care | Payer: Medicare Other | Source: Ambulatory Visit | Attending: Physician Assistant | Admitting: Physician Assistant

## 2018-07-09 ENCOUNTER — Ambulatory Visit (INDEPENDENT_AMBULATORY_CARE_PROVIDER_SITE_OTHER): Payer: Medicare Other | Admitting: Physician Assistant

## 2018-07-09 VITALS — BP 140/77 | HR 87 | Temp 97.6°F | Wt 162.6 lb

## 2018-07-09 DIAGNOSIS — W19XXXA Unspecified fall, initial encounter: Secondary | ICD-10-CM

## 2018-07-09 DIAGNOSIS — S79911A Unspecified injury of right hip, initial encounter: Secondary | ICD-10-CM | POA: Diagnosis not present

## 2018-07-09 DIAGNOSIS — M545 Low back pain, unspecified: Secondary | ICD-10-CM

## 2018-07-09 DIAGNOSIS — S3992XA Unspecified injury of lower back, initial encounter: Secondary | ICD-10-CM | POA: Diagnosis not present

## 2018-07-09 DIAGNOSIS — Z8673 Personal history of transient ischemic attack (TIA), and cerebral infarction without residual deficits: Secondary | ICD-10-CM | POA: Diagnosis not present

## 2018-07-09 DIAGNOSIS — M47816 Spondylosis without myelopathy or radiculopathy, lumbar region: Secondary | ICD-10-CM | POA: Insufficient documentation

## 2018-07-09 DIAGNOSIS — S79912A Unspecified injury of left hip, initial encounter: Secondary | ICD-10-CM | POA: Diagnosis not present

## 2018-07-09 NOTE — Progress Notes (Signed)
Patient: [name] Male    DOB: [birth year]   85 y.o.   MRN: [id number] Visit Date: 07/17/2018  Today's Provider: Trinna Post, PA-C   Chief Complaint  Patient presents with  . Fall   Subjective:     Patient reports today for increased falls. He fell about one week ago after physical therapy. He has history of multiple strokes. He fell one week ago on the grass going to the physical therapy. He reports he didn;t hit head. Hit left shoulder. Has walked very short distances after fall, less so than usual. Reports his legs gave out and he "went down."   He has a history of old left cerebellar infarct. Also, after a fall on 04/11/2018 he underwent head CT at [facility] and was found to have acute subarachnoid hemorrhage in the left occipital lobe and right posterior parietal lobe.   Lumbar spine xray 02/2017 shows: "1. Degenerative changes of the lumbar spine, mild to moderate in degree, as detailed above. Suspect some degree of associated central canal and/or neural foramen stenosis in the lower lumbar spine. If symptoms could be radiculopathic in nature, would consider lumbar spine MRI for further characterization. 2. No acute findings. 3. Aortic atherosclerosis."  He walks today with his walker, which he reports is his usual.    Fall  The accident occurred 5 to 7 days ago. The fall occurred while walking. He landed on grass. The point of impact was the left shoulder. The pain is present in the back. The pain is at a severity of 5/10. The pain is mild.    Allergies  Allergen Reactions  . Toviaz [Fesoterodine Fumarate Er] Other (See Comments)    Acid reflux     Current Outpatient Medications:  .  amLODipine (NORVASC) 5 MG tablet, Take 1 tablet (5 mg total) by mouth daily., Disp: 90 tablet, Rfl: 3 .  aspirin EC 81 MG tablet, Take 81 mg by mouth daily., Disp: , Rfl:  .  Bioflavonoid Products (ESTER C PO), Take 2 tablets by mouth daily., Disp: , Rfl:  .  Emollient  (CERAVE) CREA, Apply 1 application topically 2 (two) times daily as needed (itching). , Disp: , Rfl:  .  escitalopram (LEXAPRO) 10 MG tablet, Take 1 tablet (10 mg total) by mouth at bedtime., Disp: 90 tablet, Rfl: 3 .  fluticasone (FLONASE) 50 MCG/ACT nasal spray, Place 1 spray into both nostrils 2 (two) times daily., Disp: 16 g, Rfl: 5 .  folic acid (FOLVITE) 1 MG tablet, Take 1 mg by mouth See admin instructions. Take 1 tablet (1 mg) by mouth daily at bedtime except on Saturday, Disp: , Rfl:  .  hydrochlorothiazide (MICROZIDE) 12.5 MG capsule, Take 1 capsule (12.5 mg total) by mouth daily., Disp: 90 capsule, Rfl: 3 .  methotrexate (RHEUMATREX) 2.5 MG tablet, Take 10 mg by mouth every Saturday. , Disp: , Rfl:  .  mirabegron ER (MYRBETRIQ) 25 MG TB24 tablet, Take 1 tablet (25 mg total) by mouth daily., Disp: 28 tablet, Rfl: 0 .  naproxen sodium (ALEVE) 220 MG tablet, Take 220 mg by mouth daily as needed (pain/headache)., Disp: , Rfl:  .  OVER THE COUNTER MEDICATION, Take 1 packet by mouth 2 (two) times daily. Vitamin packet from [facility] for circulation - each packet contains 5 tablets, Disp: , Rfl:  .  Polyethyl Glycol-Propyl Glycol (SYSTANE OP), Place 1 drop into both eyes 2 (two) times daily., Disp: , Rfl:  .  polyethylene glycol powder (GLYCOLAX/MIRALAX) powder, Take 17 g by mouth daily., Disp: 255 g, Rfl: 11 .  tamsulosin (FLOMAX) 0.4 MG CAPS capsule, TAKE ONE CAPSULE BY MOUTH ONCE DAILY (Patient taking differently: Take 0.4 mg by mouth daily. ), Disp: 90 capsule, Rfl: 3 .  triamcinolone ointment (KENALOG) 0.5 %, Apply 1 application topically 2 (two) times daily., Disp: 60 g, Rfl: 3 .  Zinc 50 MG TABS, Take 50 mg by mouth daily. , Disp: , Rfl:   Review of Systems  Social History   Tobacco Use  . Smoking status: Former Smoker    Years: 10.00    Last attempt to quit: 07/08/1962    Years since quitting: 56.0  . Smokeless tobacco: Never Used  . Tobacco comment: quit 1964  Substance Use  Topics  . Alcohol use: No      Objective:   BP 140/77 (BP Location: Left Arm, Patient Position: Sitting, Cuff Size: Normal)   Pulse 87   Temp 97.6 F (36.4 C) (Oral)   Wt 162 lb 9.6 oz (73.8 kg)   SpO2 97%   BMI 24.01 kg/m  Vitals:   07/09/18 1051  BP: 140/77  Pulse: 87  Temp: 97.6 F (36.4 C)  TempSrc: Oral  SpO2: 97%  Weight: 162 lb 9.6 oz (73.8 kg)     Physical Exam Constitutional:      Appearance: Normal appearance.  Cardiovascular:     Rate and Rhythm: Normal rate.  Pulmonary:     Effort: Pulmonary effort is normal.  Musculoskeletal:     Comments: 4/5 strength bilateral lower extremities.   Skin:    General: Skin is warm and dry.  Neurological:     Mental Status: He is alert. Mental status is at baseline.     Coordination: Coordination normal.     Comments: Walks with aid of walker, which is baseline per patient.   Psychiatric:        Mood and Affect: Mood normal.        Behavior: Behavior normal.         Assessment & Plan    1. Fall, initial encounter  Patient has had multiple falls in the past few months. He has a history of old left cerebellar infarct on head imaging and then more recent bilateral subarachnoid hemorrhages. He also had a lumbar spine xray one year ago with radiology suggesting possible lumbar stenosis and MRI for further characterization if symptoms progress. Will order xray as below. Due to his age and multiple comorbidities he would likely not be a candidate for aggressive management of spinal pathology but I have advised to him and his wife that they are welcome to be evaluated by a neurologist. I have reviewed his CT head 04/2018 and lumbar spine xray 2018 and agree with findings. Will refer to neurology at their discretion.  - DG Lumbar Spine Complete; Future - DG HIP UNILAT WITH PELVIS 2-3 VIEWS RIGHT; Future - DG HIP UNILAT WITH PELVIS 2-3 VIEWS LEFT; Future  2. Bilateral low back pain without sciatica, unspecified  chronicity   3. H/O: CVA (cerebrovascular accident)  Return if symptoms worsen or fail to improve.  The entirety of the information documented in the History of Present Illness, Review of Systems and Physical Exam were personally obtained by me. Portions of this information were initially documented by Hurman Horn, CMA and reviewed by me for thoroughness and accuracy.   I have spent 25 minutes with this patient, >50% of which was spent on  counseling and coordination of care.        Trinna Post, PA-C  [facility]

## 2018-07-10 ENCOUNTER — Telehealth: Payer: Self-pay

## 2018-07-10 DIAGNOSIS — M1990 Unspecified osteoarthritis, unspecified site: Secondary | ICD-10-CM | POA: Diagnosis not present

## 2018-07-10 DIAGNOSIS — M6281 Muscle weakness (generalized): Secondary | ICD-10-CM | POA: Diagnosis not present

## 2018-07-10 DIAGNOSIS — R4189 Other symptoms and signs involving cognitive functions and awareness: Secondary | ICD-10-CM | POA: Diagnosis not present

## 2018-07-10 DIAGNOSIS — R296 Repeated falls: Secondary | ICD-10-CM | POA: Diagnosis not present

## 2018-07-10 DIAGNOSIS — R2681 Unsteadiness on feet: Secondary | ICD-10-CM | POA: Diagnosis not present

## 2018-07-10 DIAGNOSIS — R278 Other lack of coordination: Secondary | ICD-10-CM | POA: Diagnosis not present

## 2018-07-10 NOTE — Telephone Encounter (Signed)
Patient was advised. KW 

## 2018-07-10 NOTE — Telephone Encounter (Signed)
-----   Message from Trinna Post, [place] sent at 07/10/2018  8:14 AM EST ----- Hip and spine xrays do not show any fracture.

## 2018-07-13 ENCOUNTER — Telehealth: Payer: Self-pay | Admitting: Family Medicine

## 2018-07-13 ENCOUNTER — Other Ambulatory Visit: Payer: Medicare Other

## 2018-07-13 DIAGNOSIS — M1990 Unspecified osteoarthritis, unspecified site: Secondary | ICD-10-CM | POA: Diagnosis not present

## 2018-07-13 DIAGNOSIS — R278 Other lack of coordination: Secondary | ICD-10-CM | POA: Diagnosis not present

## 2018-07-13 DIAGNOSIS — M6281 Muscle weakness (generalized): Secondary | ICD-10-CM | POA: Diagnosis not present

## 2018-07-13 DIAGNOSIS — R296 Repeated falls: Secondary | ICD-10-CM | POA: Diagnosis not present

## 2018-07-13 DIAGNOSIS — C61 Malignant neoplasm of prostate: Secondary | ICD-10-CM

## 2018-07-13 DIAGNOSIS — R4189 Other symptoms and signs involving cognitive functions and awareness: Secondary | ICD-10-CM | POA: Diagnosis not present

## 2018-07-13 DIAGNOSIS — R2681 Unsteadiness on feet: Secondary | ICD-10-CM | POA: Diagnosis not present

## 2018-07-13 NOTE — Telephone Encounter (Signed)
Looks like you saw the patient on 1/2.  What are your thought about referral.  Maybe just for the sake of recurrent falls, he would benefit from seeing a Neurologist?

## 2018-07-13 NOTE — Patient Instructions (Signed)

## 2018-07-13 NOTE — Telephone Encounter (Signed)
°  Needing a referral Neurologist  - feet and back pain - latest is no breaks from latest imaging requested by Dr. B.  Please advise.  Thanks, [employer]

## 2018-07-14 LAB — PSA: Prostate Specific Ag, Serum: 0.6 ng/mL (ref 0.0–4.0)

## 2018-07-14 NOTE — Telephone Encounter (Signed)
Referral placed. I did explain to patient that there may be no treatment he qualifies for but that he can explore a further assessment.

## 2018-07-15 DIAGNOSIS — R4189 Other symptoms and signs involving cognitive functions and awareness: Secondary | ICD-10-CM | POA: Diagnosis not present

## 2018-07-15 DIAGNOSIS — R2681 Unsteadiness on feet: Secondary | ICD-10-CM | POA: Diagnosis not present

## 2018-07-15 DIAGNOSIS — M6281 Muscle weakness (generalized): Secondary | ICD-10-CM | POA: Diagnosis not present

## 2018-07-15 DIAGNOSIS — R296 Repeated falls: Secondary | ICD-10-CM | POA: Diagnosis not present

## 2018-07-15 DIAGNOSIS — M1990 Unspecified osteoarthritis, unspecified site: Secondary | ICD-10-CM | POA: Diagnosis not present

## 2018-07-15 DIAGNOSIS — R278 Other lack of coordination: Secondary | ICD-10-CM | POA: Diagnosis not present

## 2018-07-16 ENCOUNTER — Encounter: Payer: Self-pay | Admitting: Urology

## 2018-07-16 ENCOUNTER — Ambulatory Visit (INDEPENDENT_AMBULATORY_CARE_PROVIDER_SITE_OTHER): Payer: Medicare Other | Admitting: Urology

## 2018-07-16 VITALS — BP 133/72 | HR 79 | Temp 97.0°F | Resp 15 | Wt 162.0 lb

## 2018-07-16 DIAGNOSIS — Z8546 Personal history of malignant neoplasm of prostate: Secondary | ICD-10-CM

## 2018-07-16 DIAGNOSIS — R3915 Urgency of urination: Secondary | ICD-10-CM | POA: Diagnosis not present

## 2018-07-16 MED ORDER — MIRABEGRON ER 25 MG PO TB24: 25.0000 mg | ORAL_TABLET | Freq: Every day | ORAL | 0 refills | Status: DC

## 2018-07-16 NOTE — Progress Notes (Signed)
07/16/2018 3:10 PM   [name] 11/16/1924 [id number]  Referring provider: [name], MD [address] [place], [place] [address]  Chief Complaint  Patient presents with  . Follow-up    HPI: [name] is an 85 year old Caucasian male who presents today for a 6 month follow up for prostate cancer. He is doing well overall.   Prostate cancer Patient who underwent cryotherapy for a Gleason's 6 (10 cores + out of 12)-invasion present with a PSA of 4.3 and a 11% free PSA PCa in 2009 with [name].  He has not had any weight loss, bone pain or appetite loss.   Current PSA is 0.6 ng/mL on 07/13/2018.  His I PSS score on 12/08/2017 is 9/2.   His major complaints are frequency, urgency, nocturia x 2 and incontinence.  The incontinence happens after he voids.  He feels that his urinary symptoms have not improved with tamsulosin.   Patient denies any gross hematuria, dysuria or suprapubic/flank pain.  Patient denies any fevers, chills, nausea or vomiting.       PMH: Past Medical History:  Diagnosis Date  . Arthritis   . Bladder outlet obstruction   . Erectile dysfunction   . GERD (gastroesophageal reflux disease)   . Hard of hearing   . HBP (high blood pressure)   . History of nephrolithiasis   . HLD (hyperlipidemia)   . Incomplete bladder emptying   . Nocturia   . Osteoporosis   . Prostate cancer ([place])   . Shingles 2014  . Sinus problem   . Stomach ulcer   . Stroke ([place]) 2012  . Urinary frequency   . Urinary leakage     Surgical History: Past Surgical History:  Procedure Laterality Date  . CATARACT EXTRACTION  2000, 2001  . CHOLECYSTECTOMY  1972  . PROSTATE SURGERY  2008/2009  . TONSILLECTOMY    . UMBILICAL HERNIA REPAIR      Home Medications:  Allergies as of 07/16/2018      Reactions   Toviaz [fesoterodine Fumarate Er] Other (See Comments)   Acid reflux      Medication List       Accurate as of July 16, 2018  3:10 PM.  Always use your most recent med list.        amLODipine 5 MG tablet Commonly known as:  NORVASC Take 1 tablet (5 mg total) by mouth daily.   aspirin EC 81 MG tablet Take 81 mg by mouth daily.   CERAVE Crea Apply 1 application topically 2 (two) times daily as needed (itching).   escitalopram 10 MG tablet Commonly known as:  LEXAPRO Take 1 tablet (10 mg total) by mouth at bedtime.   ESTER C PO Take 2 tablets by mouth daily.   fluticasone 50 MCG/ACT nasal spray Commonly known as:  FLONASE Place 1 spray into both nostrils 2 (two) times daily.   folic acid 1 MG tablet Commonly known as:  FOLVITE Take 1 mg by mouth See admin instructions. Take 1 tablet (1 mg) by mouth daily at bedtime except on Saturday   hydrochlorothiazide 12.5 MG capsule Commonly known as:  MICROZIDE Take 1 capsule (12.5 mg total) by mouth daily.   methotrexate 2.5 MG tablet Commonly known as:  RHEUMATREX Take 10 mg by mouth every Saturday.   mirabegron ER 25 MG Tb24 tablet Commonly known as:  MYRBETRIQ Take 1 tablet (25 mg total) by mouth daily.   naproxen sodium 220  MG tablet Commonly known as:  ALEVE Take 220 mg by mouth daily as needed (pain/headache).   OVER THE COUNTER MEDICATION Take 1 packet by mouth 2 (two) times daily. Vitamin packet from [facility] for circulation - each packet contains 5 tablets   polyethylene glycol powder powder Commonly known as:  GLYCOLAX/MIRALAX Take 17 g by mouth daily.   SYSTANE OP Place 1 drop into both eyes 2 (two) times daily.   tamsulosin 0.4 MG Caps capsule Commonly known as:  FLOMAX TAKE ONE CAPSULE BY MOUTH ONCE DAILY   triamcinolone ointment 0.5 % Commonly known as:  KENALOG Apply 1 application topically 2 (two) times daily.   Zinc 50 MG Tabs Take 50 mg by mouth daily.       Allergies:  Allergies  Allergen Reactions  . Toviaz [Fesoterodine Fumarate Er] Other (See Comments)    Acid reflux    Family History: Family History  Problem  Relation Age of Onset  . Cancer Mother   . Stroke Father   . Stroke Sister   . Prostate cancer Brother   . Kidney disease Neg Hx   . Kidney cancer Neg Hx   . Bladder Cancer Neg Hx     Social History:  reports that he quit smoking about 56 years ago. He quit after 10.00 years of use. He has never used smokeless tobacco. He reports that he does not drink alcohol or use drugs.  ROS: UROLOGY Frequent Urination?: Yes Hard to postpone urination?: Yes Burning/pain with urination?: No Get up at night to urinate?: Yes Leakage of urine?: Yes Urine stream starts and stops?: No Trouble starting stream?: No Do you have to strain to urinate?: No Blood in urine?: No Urinary tract infection?: No Sexually transmitted disease?: No Injury to kidneys or bladder?: No Painful intercourse?: No Weak stream?: No Erection problems?: No Penile pain?: No  Gastrointestinal Nausea?: No Vomiting?: No Indigestion/heartburn?: No Diarrhea?: No Constipation?: No  Constitutional Fever: No Night sweats?: No Weight loss?: No Fatigue?: No  Skin Skin rash/lesions?: No Itching?: No  Eyes Blurred vision?: No Double vision?: No  Ears/Nose/Throat Sore throat?: No Sinus problems?: No  Hematologic/Lymphatic Swollen glands?: No Easy bruising?: No  Cardiovascular Leg swelling?: No Chest pain?: No  Respiratory Cough?: No Shortness of breath?: No  Endocrine Excessive thirst?: No  Musculoskeletal Back pain?: No Joint pain?: No  Neurological Headaches?: No Dizziness?: No  Psychologic Depression?: No Anxiety?: No  Physical Exam: BP 133/72   Pulse 79   Temp (!) 97 F (36.1 C)   Resp 15   Wt 162 lb (73.5 kg)   BMI 23.92 kg/m   Constitutional:  Well nourished. Alert and oriented, No acute distress. HEENT: [place] AT, moist mucus membranes.  Trachea midline, no masses. Cardiovascular: No clubbing, cyanosis, or edema. Respiratory: Normal respiratory effort, no increased work of  breathing. Skin: No rashes, bruises or suspicious lesions. Neurologic: Grossly intact, no focal deficits, moving all 4 extremities. Psychiatric: Normal mood and affect.  Laboratory Data: PSA History  0.6 ng/mL on 04/29/2016  0.6 ng/mL on 11/01/2016  0.6 ng/mL on 05/05/2017  0.6 ng/mL on 11/2017  0.6 ng/mL on 07/13/2018  I have reviewed the labs.  Assessment & Plan:    1. Prostate cancer: Patient underwent cryotherapy for a Gleason's 6 (10 cores + out of 12)-invasion present with a PSA of 4.3 and a 11% free PSA PCa in 2009. His most recent PSA was 0.6 ng/mL in 07/13/18. IPSS was 9/2 on 12/08/2017.  He will return  in 6 months for a repeated PSA and exam.   -Discontinued PTNS treatment on 02/25/2018; will continue if symptoms worsen  -Most recent PSA is 0.6 from 07/13/18, stable -Tamsulosin and Vesicare was ineffective -Given 4 Myrbetriq 25 mg daily, # 28 samples; I have advised the patient of the side effects of Myrbetriq, such as: elevation in BP, urinary retention and/or HA  -Return in 3 weeks for IPSS, PVR after trial of Myrbetriq   Return in about 3 weeks (around 08/06/2018) for recheck.  These notes generated with voice recognition software. I apologize for typographical errors.  Zara Council, PA-C  [place] [address] [place], [place] [address] [phone]  I, Lucas Mallow, am acting as a Education administrator for [employer],  I have reviewed the above documentation for accuracy and completeness, and I agree with the above.    Zara Council, PA-C

## 2018-07-16 NOTE — Patient Instructions (Signed)
Mirabegron extended-release tablets  What is this medicine?  MIRABEGRON (MIR a BEG ron) is used to treat overactive bladder. This medicine reduces the amount of bathroom visits. It may also help to control wetting accidents. It may be used alone, but sometimes may be given with other treatments.  This medicine may be used for other purposes; ask your health care provider or pharmacist if you have questions.  COMMON BRAND NAME(S): Myrbetriq  What should I tell my health care provider before I take this medicine?  They need to know if you have any of these conditions:  -high blood pressure  -kidney disease  -liver disease  -problems urinating  -prostate disease  -an unusual or allergic reaction to mirabegron, other medicines, foods, dyes, or preservatives  -pregnant or trying to get pregnant  -breast-feeding  How should I use this medicine?  Take this medicine by mouth with a glass of water. Follow the directions on the prescription label. Do not cut, crush or chew this medicine. You can take it with or without food. If it upsets your stomach, take it with food. Take your medicine at regular intervals. Do not take it more often than directed. Do not stop taking except on your doctor's advice.  Talk to your pediatrician regarding the use of this medicine in children. Special care may be needed.  Overdosage: If you think you have taken too much of this medicine contact a poison control center or emergency room at once.  NOTE: This medicine is only for you. Do not share this medicine with others.  What if I miss a dose?  If you miss a dose, take it as soon as you can. If it is almost time for your next dose, take only that dose. Do not take double or extra doses.  What may interact with this medicine?  -codeine  -desipramine  -digoxin  -flecainide  -MAOIs like Carbex, Eldepryl, Marplan, Nardil, and Parnate  -methadone  -metoprolol  -pimozide  -propafenone  -thioridazine  -warfarin  This list may not describe all possible  interactions. Give your health care provider a list of all the medicines, herbs, non-prescription drugs, or dietary supplements you use. Also tell them if you smoke, drink alcohol, or use illegal drugs. Some items may interact with your medicine.  What should I watch for while using this medicine?  Visit your doctor or health care professional for regular checks on your progress. Check your blood pressure as directed. Ask your doctor or health care professional what your blood pressure should be and when you should contact him or her.  You may need to limit your intake of tea, coffee, caffeinated sodas, or alcohol. These drinks may make your symptoms worse.  What side effects may I notice from receiving this medicine?  Side effects that you should report to your doctor or health care professional as soon as possible:  -allergic reactions like skin rash, itching or hives, swelling of the face, lips, or tongue  -high blood pressure  -fast, irregular heartbeat  -redness, blistering, peeling or loosening of the skin, including inside the mouth  -signs of infection like fever or chills; pain or difficulty passing urine  -trouble passing urine or change in the amount of urine  Side effects that usually do not require medical attention (report to your doctor or health care professional if they continue or are bothersome):  -constipation  -dry mouth  -headache  -runny nose  -stomach upset  This list may not describe all possible   side effects. Call your doctor for medical advice about side effects. You may report side effects to FDA at 1-800-FDA-1088.  Where should I keep my medicine?  Keep out of the reach of children.  Store at room temperature between 15 and 30 degrees C (59 and 86 degrees F). Throw away any unused medicine after the expiration date.  NOTE: This sheet is a summary. It may not cover all possible information. If you have questions about this medicine, talk to your doctor, pharmacist, or health care  provider.   2019 Elsevier/Gold Standard (2016-11-14 11:33:21)

## 2018-07-17 DIAGNOSIS — M1990 Unspecified osteoarthritis, unspecified site: Secondary | ICD-10-CM | POA: Diagnosis not present

## 2018-07-17 DIAGNOSIS — R2681 Unsteadiness on feet: Secondary | ICD-10-CM | POA: Diagnosis not present

## 2018-07-17 DIAGNOSIS — R296 Repeated falls: Secondary | ICD-10-CM | POA: Diagnosis not present

## 2018-07-17 DIAGNOSIS — M6281 Muscle weakness (generalized): Secondary | ICD-10-CM | POA: Diagnosis not present

## 2018-07-17 DIAGNOSIS — R4189 Other symptoms and signs involving cognitive functions and awareness: Secondary | ICD-10-CM | POA: Diagnosis not present

## 2018-07-17 DIAGNOSIS — R278 Other lack of coordination: Secondary | ICD-10-CM | POA: Diagnosis not present

## 2018-07-21 DIAGNOSIS — R278 Other lack of coordination: Secondary | ICD-10-CM | POA: Diagnosis not present

## 2018-07-21 DIAGNOSIS — R296 Repeated falls: Secondary | ICD-10-CM | POA: Diagnosis not present

## 2018-07-21 DIAGNOSIS — R2681 Unsteadiness on feet: Secondary | ICD-10-CM | POA: Diagnosis not present

## 2018-07-21 DIAGNOSIS — R4189 Other symptoms and signs involving cognitive functions and awareness: Secondary | ICD-10-CM | POA: Diagnosis not present

## 2018-07-21 DIAGNOSIS — M6281 Muscle weakness (generalized): Secondary | ICD-10-CM | POA: Diagnosis not present

## 2018-07-21 DIAGNOSIS — M1990 Unspecified osteoarthritis, unspecified site: Secondary | ICD-10-CM | POA: Diagnosis not present

## 2018-07-22 DIAGNOSIS — R278 Other lack of coordination: Secondary | ICD-10-CM | POA: Diagnosis not present

## 2018-07-22 DIAGNOSIS — M1990 Unspecified osteoarthritis, unspecified site: Secondary | ICD-10-CM | POA: Diagnosis not present

## 2018-07-22 DIAGNOSIS — R296 Repeated falls: Secondary | ICD-10-CM | POA: Diagnosis not present

## 2018-07-22 DIAGNOSIS — R4189 Other symptoms and signs involving cognitive functions and awareness: Secondary | ICD-10-CM | POA: Diagnosis not present

## 2018-07-22 DIAGNOSIS — M6281 Muscle weakness (generalized): Secondary | ICD-10-CM | POA: Diagnosis not present

## 2018-07-22 DIAGNOSIS — R2681 Unsteadiness on feet: Secondary | ICD-10-CM | POA: Diagnosis not present

## 2018-07-24 DIAGNOSIS — R278 Other lack of coordination: Secondary | ICD-10-CM | POA: Diagnosis not present

## 2018-07-24 DIAGNOSIS — R2681 Unsteadiness on feet: Secondary | ICD-10-CM | POA: Diagnosis not present

## 2018-07-24 DIAGNOSIS — M6281 Muscle weakness (generalized): Secondary | ICD-10-CM | POA: Diagnosis not present

## 2018-07-24 DIAGNOSIS — M1990 Unspecified osteoarthritis, unspecified site: Secondary | ICD-10-CM | POA: Diagnosis not present

## 2018-07-24 DIAGNOSIS — R296 Repeated falls: Secondary | ICD-10-CM | POA: Diagnosis not present

## 2018-07-24 DIAGNOSIS — R4189 Other symptoms and signs involving cognitive functions and awareness: Secondary | ICD-10-CM | POA: Diagnosis not present

## 2018-07-27 DIAGNOSIS — R278 Other lack of coordination: Secondary | ICD-10-CM | POA: Diagnosis not present

## 2018-07-27 DIAGNOSIS — R4189 Other symptoms and signs involving cognitive functions and awareness: Secondary | ICD-10-CM | POA: Diagnosis not present

## 2018-07-27 DIAGNOSIS — M1990 Unspecified osteoarthritis, unspecified site: Secondary | ICD-10-CM | POA: Diagnosis not present

## 2018-07-27 DIAGNOSIS — R296 Repeated falls: Secondary | ICD-10-CM | POA: Diagnosis not present

## 2018-07-27 DIAGNOSIS — R2681 Unsteadiness on feet: Secondary | ICD-10-CM | POA: Diagnosis not present

## 2018-07-27 DIAGNOSIS — M6281 Muscle weakness (generalized): Secondary | ICD-10-CM | POA: Diagnosis not present

## 2018-07-29 DIAGNOSIS — R278 Other lack of coordination: Secondary | ICD-10-CM | POA: Diagnosis not present

## 2018-07-29 DIAGNOSIS — R2681 Unsteadiness on feet: Secondary | ICD-10-CM | POA: Diagnosis not present

## 2018-07-29 DIAGNOSIS — M6281 Muscle weakness (generalized): Secondary | ICD-10-CM | POA: Diagnosis not present

## 2018-07-29 DIAGNOSIS — M1990 Unspecified osteoarthritis, unspecified site: Secondary | ICD-10-CM | POA: Diagnosis not present

## 2018-07-29 DIAGNOSIS — R4189 Other symptoms and signs involving cognitive functions and awareness: Secondary | ICD-10-CM | POA: Diagnosis not present

## 2018-07-29 DIAGNOSIS — R296 Repeated falls: Secondary | ICD-10-CM | POA: Diagnosis not present

## 2018-07-30 ENCOUNTER — Encounter: Payer: Self-pay | Admitting: Physician Assistant

## 2018-07-30 ENCOUNTER — Ambulatory Visit (INDEPENDENT_AMBULATORY_CARE_PROVIDER_SITE_OTHER): Payer: Medicare Other | Admitting: Physician Assistant

## 2018-07-30 VITALS — BP 149/72 | HR 78 | Temp 97.8°F | Resp 16 | Wt 167.0 lb

## 2018-07-30 DIAGNOSIS — M25521 Pain in right elbow: Secondary | ICD-10-CM

## 2018-07-30 DIAGNOSIS — W19XXXA Unspecified fall, initial encounter: Secondary | ICD-10-CM | POA: Diagnosis not present

## 2018-07-30 NOTE — Progress Notes (Signed)
Patient: [name] Male    DOB: 1925/06/03   85 y.o.   MRN: [id number] Visit Date: 08/05/2018  Today's Provider: Trinna Post, PA-C   Chief Complaint  Patient presents with  . Fall   Subjective:     HPI Patient here today c/o fall at home 2 days ago in his bathroom. Patient reports he has several bruises and a small cut. Patient reports knees are really soar. Patient reports he fell mostly on to his right side. Patient reports swelling on right elbow.   Wife provides some of the history. Patient's wife reports she was putting her cat to bed in the bathroom and reports she heard a noise outside in the hall when her husband came through the bathroom door falling. The door was closed and the patient fell through a closed door onto his wife. This happened two nights ago on Tuesday. Then dropped some food and fell onto the regrigerator on his left side and slid down the fridge.   He reports he feels overall well today. His tetanus shot is UTD.   Allergies  Allergen Reactions  . Toviaz [Fesoterodine Fumarate Er] Other (See Comments)    Acid reflux     Current Outpatient Medications:  .  amLODipine (NORVASC) 5 MG tablet, Take 1 tablet (5 mg total) by mouth daily., Disp: 90 tablet, Rfl: 3 .  aspirin EC 81 MG tablet, Take 81 mg by mouth daily., Disp: , Rfl:  .  Bioflavonoid Products (ESTER C PO), Take 2 tablets by mouth daily., Disp: , Rfl:  .  Emollient (CERAVE) CREA, Apply 1 application topically 2 (two) times daily as needed (itching). , Disp: , Rfl:  .  escitalopram (LEXAPRO) 10 MG tablet, Take 1 tablet (10 mg total) by mouth at bedtime., Disp: 90 tablet, Rfl: 3 .  fluticasone (FLONASE) 50 MCG/ACT nasal spray, Place 1 spray into both nostrils 2 (two) times daily., Disp: 16 g, Rfl: 5 .  folic acid (FOLVITE) 1 MG tablet, Take 1 mg by mouth See admin instructions. Take 1 tablet (1 mg) by mouth daily at bedtime except on Saturday, Disp: , Rfl:  .  hydrochlorothiazide  (MICROZIDE) 12.5 MG capsule, Take 1 capsule (12.5 mg total) by mouth daily., Disp: 90 capsule, Rfl: 3 .  methotrexate (RHEUMATREX) 2.5 MG tablet, Take 10 mg by mouth every Saturday. , Disp: , Rfl:  .  mirabegron ER (MYRBETRIQ) 25 MG TB24 tablet, Take 1 tablet (25 mg total) by mouth daily., Disp: 28 tablet, Rfl: 0 .  naproxen sodium (ALEVE) 220 MG tablet, Take 220 mg by mouth daily as needed (pain/headache)., Disp: , Rfl:  .  OVER THE COUNTER MEDICATION, Take 1 packet by mouth 2 (two) times daily. Vitamin packet from [facility] for circulation - each packet contains 5 tablets, Disp: , Rfl:  .  Polyethyl Glycol-Propyl Glycol (SYSTANE OP), Place 1 drop into both eyes 2 (two) times daily., Disp: , Rfl:  .  polyethylene glycol powder (GLYCOLAX/MIRALAX) powder, Take 17 g by mouth daily., Disp: 255 g, Rfl: 11 .  tamsulosin (FLOMAX) 0.4 MG CAPS capsule, TAKE ONE CAPSULE BY MOUTH ONCE DAILY (Patient taking differently: Take 0.4 mg by mouth daily. ), Disp: 90 capsule, Rfl: 3 .  triamcinolone ointment (KENALOG) 0.5 %, Apply 1 application topically 2 (two) times daily., Disp: 60 g, Rfl: 3 .  Zinc 50 MG TABS, Take 50 mg by mouth daily. , Disp: , Rfl:   Review of Systems  Constitutional: Negative.   Respiratory: Negative.   Hematological: Bruises/bleeds easily.    Social History   Tobacco Use  . Smoking status: Former Smoker    Years: 10.00    Last attempt to quit: 07/08/1962    Years since quitting: 56.1  . Smokeless tobacco: Never Used  . Tobacco comment: quit 1964  Substance Use Topics  . Alcohol use: No      Objective:   BP (!) 149/72 (BP Location: Left Arm, Patient Position: Sitting, Cuff Size: Normal)   Pulse 78   Temp 97.8 F (36.6 C) (Oral)   Resp 16   Wt 167 lb (75.8 kg)   BMI 24.66 kg/m  Vitals:   07/30/18 1215  BP: (!) 149/72  Pulse: 78  Resp: 16  Temp: 97.8 F (36.6 C)  TempSrc: Oral  Weight: 167 lb (75.8 kg)     Physical Exam Constitutional:      Appearance: Normal  appearance.  HENT:     Head: Normocephalic and atraumatic.  Eyes:     Extraocular Movements: Extraocular movements intact.     Pupils: Pupils are equal, round, and reactive to light.  Musculoskeletal:     Right elbow: Tenderness found.       Hands:     Comments: Mild tenderness to palpation of right elbow but retains full ROm.   Skin:    General: Skin is warm and dry.  Neurological:     General: No focal deficit present.     Mental Status: He is alert and oriented to person, place, and time. Mental status is at baseline.  Psychiatric:        Mood and Affect: Mood normal.        Behavior: Behavior normal.         Assessment & Plan    1. Fall, initial encounter  [place] a couple of days ago. Did not lose consciousness or hit head. Mild tenderness of right elbow and skin laceration on right hand. Tetanus shot UTD. Otherwise, exam is normal and at baseline. He has appointment in February with neurology regarding repeated falls.   2. Right elbow pain  The entirety of the information documented in the History of Present Illness, Review of Systems and Physical Exam were personally obtained by me. Portions of this information were initially documented by Lynford Humphrey, CMA and reviewed by me for thoroughness and accuracy.   Return if symptoms worsen or fail to improve.       Trinna Post, PA-C  [facility]

## 2018-07-31 DIAGNOSIS — M1990 Unspecified osteoarthritis, unspecified site: Secondary | ICD-10-CM | POA: Diagnosis not present

## 2018-07-31 DIAGNOSIS — M6281 Muscle weakness (generalized): Secondary | ICD-10-CM | POA: Diagnosis not present

## 2018-07-31 DIAGNOSIS — R296 Repeated falls: Secondary | ICD-10-CM | POA: Diagnosis not present

## 2018-07-31 DIAGNOSIS — R278 Other lack of coordination: Secondary | ICD-10-CM | POA: Diagnosis not present

## 2018-07-31 DIAGNOSIS — R2681 Unsteadiness on feet: Secondary | ICD-10-CM | POA: Diagnosis not present

## 2018-07-31 DIAGNOSIS — R4189 Other symptoms and signs involving cognitive functions and awareness: Secondary | ICD-10-CM | POA: Diagnosis not present

## 2018-08-03 DIAGNOSIS — R4189 Other symptoms and signs involving cognitive functions and awareness: Secondary | ICD-10-CM | POA: Diagnosis not present

## 2018-08-03 DIAGNOSIS — M1990 Unspecified osteoarthritis, unspecified site: Secondary | ICD-10-CM | POA: Diagnosis not present

## 2018-08-03 DIAGNOSIS — M6281 Muscle weakness (generalized): Secondary | ICD-10-CM | POA: Diagnosis not present

## 2018-08-03 DIAGNOSIS — R278 Other lack of coordination: Secondary | ICD-10-CM | POA: Diagnosis not present

## 2018-08-03 DIAGNOSIS — R296 Repeated falls: Secondary | ICD-10-CM | POA: Diagnosis not present

## 2018-08-03 DIAGNOSIS — R2681 Unsteadiness on feet: Secondary | ICD-10-CM | POA: Diagnosis not present

## 2018-08-04 NOTE — Progress Notes (Incomplete)
08/06/2018  8:07 AM   [name] 01-03-1925 [id number]  Referring provider: [name], MD [address] [place], [place] [address]  No chief complaint on file.   HPI: [name] is an 85 year old Caucasian male who presents today for a 3 week f/u for prostate cancer. He is doing well overall.   Prostate cancer Patient who underwent cryotherapy for a Gleason's 6 (10 cores + out of 12)-invasion present with a PSA of 4.3 and a 11% free PSA PCa in 2009 with Dr. Jacqlyn Patton.  He has not had any weight loss, bone pain or appetite loss.   Current PSA is 0.6 ng/mL on 07/13/2018.  His I PSS score on 12/08/2017 is 9/2.   His major complaints are frequency, urgency, nocturia x 2 and incontinence.  The incontinence happens after he voids.  He feels that his urinary symptoms have not improved with tamsulosin.   Patient denies any gross hematuria, dysuria or suprapubic/flank pain.  Patient denies any fevers, chills, nausea or vomiting.       PMH: Past Medical History:  Diagnosis Date   Arthritis    Bladder outlet obstruction    Erectile dysfunction    GERD (gastroesophageal reflux disease)    Hard of hearing    HBP (high blood pressure)    History of nephrolithiasis    HLD (hyperlipidemia)    Incomplete bladder emptying    Nocturia    Osteoporosis    Prostate cancer ([place])    Shingles 2014   Sinus problem    Stomach ulcer    Stroke ([place]) 2012   Urinary frequency    Urinary leakage     Surgical History: Past Surgical History:  Procedure Laterality Date   CATARACT EXTRACTION  2000, 2001   [place]  2008/2009   TONSILLECTOMY     UMBILICAL HERNIA REPAIR      Home Medications:  Allergies as of 08/06/2018      Reactions   Toviaz [fesoterodine Fumarate Er] Other (See Comments)   Acid reflux      Medication List       Accurate as of August 06, 2018  8:07 AM. Always use your most recent med  list.        amLODipine 5 MG tablet Commonly known as:  NORVASC Take 1 tablet (5 mg total) by mouth daily.   aspirin EC 81 MG tablet Take 81 mg by mouth daily.   CERAVE Crea Apply 1 application topically 2 (two) times daily as needed (itching).   escitalopram 10 MG tablet Commonly known as:  LEXAPRO Take 1 tablet (10 mg total) by mouth at bedtime.   ESTER C PO Take 2 tablets by mouth daily.   fluticasone 50 MCG/ACT nasal spray Commonly known as:  FLONASE Place 1 spray into both nostrils 2 (two) times daily.   folic acid 1 MG tablet Commonly known as:  FOLVITE Take 1 mg by mouth See admin instructions. Take 1 tablet (1 mg) by mouth daily at bedtime except on Saturday   hydrochlorothiazide 12.5 MG capsule Commonly known as:  MICROZIDE Take 1 capsule (12.5 mg total) by mouth daily.   methotrexate 2.5 MG tablet Commonly known as:  RHEUMATREX Take 10 mg by mouth every Saturday.   mirabegron ER 25 MG Tb24 tablet Commonly known as:  MYRBETRIQ Take 1 tablet (25 mg total) by mouth daily.   naproxen sodium 220 MG tablet Commonly known as:  ALEVE Take 220 mg by mouth daily as needed (pain/headache).   OVER THE COUNTER MEDICATION Take 1 packet by mouth 2 (two) times daily. Vitamin packet from [facility] for circulation - each packet contains 5 tablets   polyethylene glycol powder powder Commonly known as:  GLYCOLAX/MIRALAX Take 17 g by mouth daily.   SYSTANE OP Place 1 drop into both eyes 2 (two) times daily.   tamsulosin 0.4 MG Caps capsule Commonly known as:  FLOMAX TAKE ONE CAPSULE BY MOUTH ONCE DAILY   triamcinolone ointment 0.5 % Commonly known as:  KENALOG Apply 1 application topically 2 (two) times daily.   Zinc 50 MG Tabs Take 50 mg by mouth daily.       Allergies:  Allergies  Allergen Reactions   Toviaz [Fesoterodine Fumarate Er] Other (See Comments)    Acid reflux    Family History: Family History  Problem Relation Age of Onset   Cancer  Mother    Stroke Father    Stroke Sister    Prostate cancer Brother    Kidney disease Neg Hx    Kidney cancer Neg Hx    Bladder Cancer Neg Hx     Social History:  reports that he quit smoking about 56 years ago. He quit after 10.00 years of use. He has never used smokeless tobacco. He reports that he does not drink alcohol or use drugs.  ROS:                                        Physical Exam: There were no vitals taken for this visit.  Constitutional:  Well nourished. Alert and oriented, No acute distress. HEENT: [place] AT, moist mucus membranes.  Trachea midline, no masses. Cardiovascular: No clubbing, cyanosis, or edema. Respiratory: Normal respiratory effort, no increased work of breathing. GI: Abdomen is soft, non tender, non distended, no abdominal masses. Liver and spleen not palpable.  No hernias appreciated.  Stool sample for occult testing is not indicated.   GU: No CVA tenderness.  No bladder fullness or masses.  Patient with circumcised/uncircumcised phallus. ***Foreskin easily retracted***  Urethral meatus is patent.  No penile discharge. No penile lesions or rashes. Scrotum without lesions, cysts, rashes and/or edema.  Testicles are located scrotally bilaterally. No masses are appreciated in the testicles. Left and right epididymis are normal. Rectal: Patient with  normal sphincter tone. Anus and perineum without scarring or rashes. No rectal masses are appreciated. Prostate is approximately *** grams, *** nodules are appreciated. Seminal vesicles are normal. Skin: No rashes, bruises or suspicious lesions. Lymph: No cervical or inguinal adenopathy. Neurologic: Grossly intact, no focal deficits, moving all 4 extremities. Psychiatric: Normal mood and affect.  Laboratory Data: PSA History  0.6 ng/mL on 04/29/2016  0.6 ng/mL on 11/01/2016  0.6 ng/mL on 05/05/2017  0.6 ng/mL on 11/2017  0.6 ng/mL on 07/13/2018  I have reviewed the  labs.  Pertinent Imagings:  ***  Assessment & Plan:    1. Prostate cancer: Patient underwent cryotherapy for a Gleason's 6 (10 cores + out of 12)-invasion present with a PSA of 4.3 and a 11% free PSA PCa in 2009. His most recent PSA was 0.6 ng/mL in 07/13/18. IPSS was 9/2 on 12/08/2017.  He will return in 6 months for a repeated PSA and exam.   -Discontinued PTNS treatment on 02/25/2018; will continue if symptoms worsen  -Most recent PSA is 0.6  from 07/13/18, stable -Tamsulosin and Vesicare was ineffective -Given 4 Myrbetriq 25 mg daily, # 28 samples; I have advised the patient of the side effects of Myrbetriq, such as: elevation in BP, urinary retention and/or HA  -Return in 3 weeks for IPSS, PVR after trial of Myrbetriq   No follow-ups on file.  These notes generated with voice recognition software. I apologize for typographical errors.  [facility] [address] [place],  [address] [phone]   I, Lucas Mallow, am acting as a Education administrator for [employer],  {Add Scribe Attestation Statement}

## 2018-08-05 DIAGNOSIS — R4189 Other symptoms and signs involving cognitive functions and awareness: Secondary | ICD-10-CM | POA: Diagnosis not present

## 2018-08-05 DIAGNOSIS — M6281 Muscle weakness (generalized): Secondary | ICD-10-CM | POA: Diagnosis not present

## 2018-08-05 DIAGNOSIS — R296 Repeated falls: Secondary | ICD-10-CM | POA: Diagnosis not present

## 2018-08-05 DIAGNOSIS — R278 Other lack of coordination: Secondary | ICD-10-CM | POA: Diagnosis not present

## 2018-08-05 DIAGNOSIS — R2681 Unsteadiness on feet: Secondary | ICD-10-CM | POA: Diagnosis not present

## 2018-08-05 DIAGNOSIS — M1990 Unspecified osteoarthritis, unspecified site: Secondary | ICD-10-CM | POA: Diagnosis not present

## 2018-08-05 NOTE — Patient Instructions (Signed)

## 2018-08-06 ENCOUNTER — Ambulatory Visit: Payer: Medicare Other | Admitting: Urology

## 2018-08-06 DIAGNOSIS — M6281 Muscle weakness (generalized): Secondary | ICD-10-CM | POA: Diagnosis not present

## 2018-08-06 DIAGNOSIS — M1990 Unspecified osteoarthritis, unspecified site: Secondary | ICD-10-CM | POA: Diagnosis not present

## 2018-08-06 DIAGNOSIS — R4189 Other symptoms and signs involving cognitive functions and awareness: Secondary | ICD-10-CM | POA: Diagnosis not present

## 2018-08-06 DIAGNOSIS — R2681 Unsteadiness on feet: Secondary | ICD-10-CM | POA: Diagnosis not present

## 2018-08-06 DIAGNOSIS — R296 Repeated falls: Secondary | ICD-10-CM | POA: Diagnosis not present

## 2018-08-06 DIAGNOSIS — R278 Other lack of coordination: Secondary | ICD-10-CM | POA: Diagnosis not present

## 2018-08-07 DIAGNOSIS — M6281 Muscle weakness (generalized): Secondary | ICD-10-CM | POA: Diagnosis not present

## 2018-08-07 DIAGNOSIS — R4189 Other symptoms and signs involving cognitive functions and awareness: Secondary | ICD-10-CM | POA: Diagnosis not present

## 2018-08-07 DIAGNOSIS — R278 Other lack of coordination: Secondary | ICD-10-CM | POA: Diagnosis not present

## 2018-08-07 DIAGNOSIS — R296 Repeated falls: Secondary | ICD-10-CM | POA: Diagnosis not present

## 2018-08-07 DIAGNOSIS — R2681 Unsteadiness on feet: Secondary | ICD-10-CM | POA: Diagnosis not present

## 2018-08-07 DIAGNOSIS — M1990 Unspecified osteoarthritis, unspecified site: Secondary | ICD-10-CM | POA: Diagnosis not present

## 2018-08-10 ENCOUNTER — Telehealth: Payer: Self-pay | Admitting: Physician Assistant

## 2018-08-10 ENCOUNTER — Telehealth: Payer: Self-pay | Admitting: Family Medicine

## 2018-08-10 DIAGNOSIS — R2681 Unsteadiness on feet: Secondary | ICD-10-CM | POA: Diagnosis not present

## 2018-08-10 NOTE — Telephone Encounter (Signed)
Patient advised.

## 2018-08-10 NOTE — Telephone Encounter (Signed)
Can apply moist heat to the area and continue massaging. If it gets larger or starts causing numbness/tingling/pain into the right hand needs to be evaluated.

## 2018-08-10 NOTE — Telephone Encounter (Signed)
Patient still has a big lump on his right elbow where he fell and hurt it.  Its badly bruised under the right arm.  The homecare nurse told him to massage it and its still as big as a golf ball which is smaller than the initial lump.   It still hurts when he rest his arm on the chair.   Should he be concerned about the lump and continue massaging it??

## 2018-08-12 DIAGNOSIS — R2681 Unsteadiness on feet: Secondary | ICD-10-CM | POA: Diagnosis not present

## 2018-08-14 DIAGNOSIS — R2681 Unsteadiness on feet: Secondary | ICD-10-CM | POA: Diagnosis not present

## 2018-08-17 DIAGNOSIS — R2681 Unsteadiness on feet: Secondary | ICD-10-CM | POA: Diagnosis not present

## 2018-08-19 ENCOUNTER — Other Ambulatory Visit: Payer: Self-pay | Admitting: Family Medicine

## 2018-08-19 DIAGNOSIS — R2681 Unsteadiness on feet: Secondary | ICD-10-CM | POA: Diagnosis not present

## 2018-08-19 DIAGNOSIS — R296 Repeated falls: Secondary | ICD-10-CM

## 2018-08-19 DIAGNOSIS — Z79899 Other long term (current) drug therapy: Secondary | ICD-10-CM

## 2018-08-21 ENCOUNTER — Ambulatory Visit: Payer: Self-pay | Admitting: Pharmacist

## 2018-08-21 DIAGNOSIS — R2681 Unsteadiness on feet: Secondary | ICD-10-CM | POA: Diagnosis not present

## 2018-08-21 DIAGNOSIS — R296 Repeated falls: Secondary | ICD-10-CM

## 2018-08-21 DIAGNOSIS — Z79899 Other long term (current) drug therapy: Secondary | ICD-10-CM

## 2018-08-21 NOTE — Chronic Care Management (AMB) (Signed)
  Chronic Care Management   Note  08/21/2018 Name: EDDISON SEARLS MRN: [id number] DOB: 11-14-1924  [name] is a 85 y.o. year old male who sees Bacigalupo, Dionne Bucy, MD for primary care. The patient's wife, Mrs. Dalon Reichart asked the CCM team to consult with Dr. Brita Romp for a referral to the CCM program. Referral was placed 08/20/18. Telephone outreach to patient today to introduce CCM services.   Mr. Uher agreed that CCM services would be helpful to them.   Plan: Patient agreed to services and verbal consent obtained. I have scheduled an appointment for [facility] next week.    Shaquile M Cuen was given information about Chronic Care Management services today including:  1. CCM service includes personalized support from designated clinical staff supervised by her physician, including individualized plan of care and coordination with other care providers 2. 24/7 contact phone numbers for assistance for urgent and routine care needs. 3. Service will only be billed when office clinical staff spend 20 minutes or more in a month to coordinate care. 4. Only one practitioner may furnish and bill the service in a calendar month. 5. The patient may stop CCM services at any time (effective at the end of the month) by phone call to the office staff. 6. The patient will be responsible for cost sharing (co-pay) of up to 20% of the service fee (after annual deductible is met).   [name], PharmD Clinical Pharmacist [facility] [phone]

## 2018-08-21 NOTE — Patient Instructions (Signed)
[name] was given information about Chronic Care Management services today including:  1. CCM service includes personalized support from designated clinical staff supervised by his physician, including individualized plan of care and coordination with other care providers 2. 24/7 contact phone numbers for assistance for urgent and routine care needs. 3. Service will only be billed when office clinical staff spend 20 minutes or more in a month to coordinate care. 4. Only one practitioner may furnish and bill the service in a calendar month. 5. The patient may stop CCM services at any time (effective at the end of the month) by phone call to the office staff. 6. The patient will be responsible for cost sharing (co-pay) of up to 20% of the service fee (after annual deductible is met).  Patient agreed to services and verbal consent obtained.

## 2018-08-24 NOTE — Telephone Encounter (Signed)
[name] Dr. Silvano Rusk office called wanting to know if we received [place] in Oct for pt's right carotid wanting to know if you ordered a carotid doppler.  She said they faxed it to [place] in October  CB#  [phone]  Thanks terii

## 2018-08-25 NOTE — Telephone Encounter (Signed)
We did get a carotid US and it showed: 1. Mild (1-49%) stenosis proximal left internal carotid artery secondary to mild smooth heterogeneous atherosclerotic plaque. 2. Trace atherosclerotic plaque on the right without evidence of stenosis. 3. Vertebral arteries are patent with normal antegrade flow.  No intervention needed

## 2018-08-26 ENCOUNTER — Ambulatory Visit: Payer: Self-pay

## 2018-08-26 DIAGNOSIS — M545 Low back pain: Secondary | ICD-10-CM | POA: Diagnosis not present

## 2018-08-26 DIAGNOSIS — G8929 Other chronic pain: Secondary | ICD-10-CM | POA: Diagnosis not present

## 2018-08-26 DIAGNOSIS — Z8673 Personal history of transient ischemic attack (TIA), and cerebral infarction without residual deficits: Secondary | ICD-10-CM | POA: Diagnosis not present

## 2018-08-26 NOTE — Telephone Encounter (Signed)
[name] a message advising her that Dr. Brita Romp did receive the [place] results and to call back if she needs any further assistance.

## 2018-08-26 NOTE — Chronic Care Management (AMB) (Signed)
  Chronic Care Management   Note  08/26/2018 Name: NAJEH CREDIT MRN: [id number] DOB: 1924/07/27  Care Coordination: Successful telephone encounter to Ms. Kathrynn Running, wife of patient to provide option for rescheduling tomorrows face to face appointment with the CCM Team related to the possibility of inclimate weather. Ms. Lacko requested rescheduling and very thankful for the option.   Plan: The CCM Team will meet with patient and wife on 09/02/2018  Keily Lepp E. Rollene Rotunda, RN, BSN Nurse Care Coordinator [facility] Care Management [phone]

## 2018-08-27 ENCOUNTER — Ambulatory Visit: Payer: Medicare Other

## 2018-09-02 ENCOUNTER — Ambulatory Visit: Payer: Medicare Other | Admitting: Pharmacist

## 2018-09-02 ENCOUNTER — Ambulatory Visit (INDEPENDENT_AMBULATORY_CARE_PROVIDER_SITE_OTHER): Payer: Medicare Other

## 2018-09-02 DIAGNOSIS — Z8673 Personal history of transient ischemic attack (TIA), and cerebral infarction without residual deficits: Secondary | ICD-10-CM

## 2018-09-02 DIAGNOSIS — I10 Essential (primary) hypertension: Secondary | ICD-10-CM | POA: Diagnosis not present

## 2018-09-02 DIAGNOSIS — Z79899 Other long term (current) drug therapy: Secondary | ICD-10-CM

## 2018-09-02 DIAGNOSIS — R296 Repeated falls: Secondary | ICD-10-CM

## 2018-09-02 NOTE — Chronic Care Management (AMB) (Signed)
Chronic Care Management   Initial Visit Note  09/02/2018 Name: [name] MRN: [id number] DOB: August 18, 1924  Subjective: "I am doing pretty good with my health but I fall a lot"  Objective:  BP Readings from Last 3 Encounters:  07/30/18 (!) 149/72  07/16/18 133/72  07/09/18 140/77    Assessment: [name] is a 85 y.o. year old male who sees Bacigalupo, Dionne Bucy, MD for primary care. The patient's wife, [name] asked the CCM team to consult with Dr. Brita Romp for a referral to the CCM program. Referral was placed 08/20/18. Today [name] met with the CCM Team along with his wife to establish goals.   Goals Addressed            This Visit's Progress   . "I fall a lot" (pt-stated)       [name] and his wife live independently in a retirement community. They have access to OT and PT who have assist with de-cluttering their homes and provided them with fall precautions. Unfortunately [name] sustained a fall recently. She stated he dropped food on the floor and bent over to pick it up and lost his balance. There is a cat in the home that poses a huge fall risk. [name] has had significant injury from previous falls including broken ribs. He states all scatter rugs have been removed from the home. He admits to forgetting to lock his rolator and not having a grabber.   Current Barriers:  [name] Knowledge Deficits related to fall precautions . Lacks caregiver support.   Nurse Case Manager Clinical Goal(s):  [name] Over the next 60 days, patient will demonstrate improved adherence to prescribed treatment plan for decreasing falls as evidenced bypatient reporting and review of EMR . Over the next 30 days, patient will verbalize using fall risk reduction strategies discussed today  Interventions:  . Provided patient with EMMI educational materials related to preventing falls and how to safely get up from falls . Provided education to patient re: criteria  for being a fall risk and reduction stratagies  Patient Self Care Activities:  . Utilize Rolator appropriately with all ambulation . De-clutter walkways . Wear secure fitting shoes at all times with ambulation . Utilize home lighting for dim lit areas . Have self and cat location awareness at all times  Plan:  . RN CM will follow up with patient in 14 days  *initial goal documentation         Follow up plan:  Telephone follow up appointment with CCM team member scheduled for: 2 weeks   [name] was given information about Chronic Care Management services today including:  1. CCM service includes personalized support from designated clinical staff supervised by his physician, including individualized plan of care and coordination with other care providers 2. 24/7 contact phone numbers for assistance for urgent and routine care needs. 3. Service will only be billed when office clinical staff spend 20 minutes or more in a month to coordinate care. 4. Only one practitioner may furnish and bill the service in a calendar month. 5. The patient may stop CCM services at any time (effective at the end of the month) by phone call to the office staff. 6. The patient will be responsible for cost sharing (co-pay) of up to 20% of the service fee (after annual deductible is met).  Patient agreed to services and verbal consent obtained.  [name] E. Rollene Rotunda, RN, BSN Nurse Care Coordinator [facility]  Care Management [phone]

## 2018-09-02 NOTE — Patient Instructions (Signed)
  Thank you allowing the Chronic Care Management Team to be a part of your care! It was a pleasure speaking with you today!  1. Please make sure all clutter is removed from the floor in your home 2. Be very careful to not trip over your cat!!! 3. Make sure your Rolator is locked prior to sitting or standing 4. Make sure your home is well lit, especially during the night. Using night lights in your bedroom, bathroom, and hallways is a great idea 5. Change positions slowly. Changing positions rapidly could cause dizziness which could cause you to fall 6. Make sure you have good fitting shoes on at all times. 7. DO NOT BEND OVER TO PICK THINGS UP. THIS COULD CAUSE YOU TO LOOSE YOUR BALANCE AND FALL. 8. Consider purchasing a grabber to pick up small things off the floor.   CCM (Chronic Care Management) Team   Trish Fountain RN, BSN Nurse Care Coordinator  [phone]  [name] PharmD  Clinical Pharmacist  [phone]   Goals Addressed            This Visit's Progress   . "I fall a lot" (pt-stated)       Current Barriers:  [name] Knowledge Deficits related to fall precautions . Lacks caregiver support.   Nurse Case Manager Clinical Goal(s):  [name] Over the next 60 days, patient will demonstrate improved adherence to prescribed treatment plan for decreasing falls as evidenced bypatient reporting and review of EMR . Over the next 30 days, patient will verbalize using fall risk reduction strategies discussed today  Interventions:  . Provided patient with EMMI educational materials related to preventing falls and how to safely get up from falls . Provided education to patient re: criteria for being a fall risk and reduction stratagies  Patient Self Care Activities:  . Utilize Rolator appropriately with all ambulation . De-clutter walkways . Wear secure fitting shoes at all times with ambulation . Utilize home lighting for dim lit areas . Have self and cat location awareness at  all times  Plan:  . RN CM will follow up with patient in 14 days  *initial goal documentation        Print copy of patient instructions provided.   Telephone follow up appointment with CCM team member scheduled for:14 days   Mr. Smola was given information about Chronic Care Management services today including:  1. CCM service includes personalized support from designated clinical staff supervised by his physician, including individualized plan of care and coordination with other care providers 2. 24/7 contact phone numbers for assistance for urgent and routine care needs. 3. Service will only be billed when office clinical staff spend 20 minutes or more in a month to coordinate care. 4. Only one practitioner may furnish and bill the service in a calendar month. 5. The patient may stop CCM services at any time (effective at the end of the month) by phone call to the office staff. 6. The patient will be responsible for cost sharing (co-pay) of up to 20% of the service fee (after annual deductible is met).  Patient agreed to services and verbal consent obtained.

## 2018-09-04 NOTE — Patient Instructions (Signed)
Patient provided AVS as part of RNCM visit  Goals Addressed            This Visit's Progress   . Why am I on these medicines? (pt-stated)       Current Barriers:  [name] Knowledge deficit surrounding purpose and mechanism of medications  Pharmacist Clinical Goal(s): Over the next 30 days, [name] will verbalize understanding of medication plan as it relates to his plan of care.   Interventions: . Patient educated on purpose, proper use and potential adverse effects of Myrbetriq, various OTC supplements  . Reviewed medication list  . Patient provided with an easy to understand medication guide made by CCM pharmacist  Patient Self Care Activities:  . Take all medications as prescribed   *initial goal documentation        The patient verbalized understanding of instructions provided today and declined a print copy of patient instruction materials.

## 2018-09-04 NOTE — Chronic Care Management (AMB) (Signed)
Chronic Care Management   Note  09/04/2018 Name: RHYSE Patton MRN: [id number] DOB: [birth year]  Subjective:   Does the patient  feel that his/her medications are working for him/her?  yes  Has the patient been experiencing any side effects to the medications prescribed?  no  Does the patient measure his/her own blood glucose at home?  no   Does the patient measure his/her own blood pressure at home? yes   Does the patient have any problems obtaining medications due to transportation or finances?   no  Understanding of regimen: poor Understanding of indications: poor Potential of compliance: poor  Objective: Lab Results  Component Value Date   CREATININE 1.06 04/11/2018   CREATININE 0.97 11/17/2017   CREATININE 0.99 09/15/2017    Lab Results  Component Value Date   HGBA1C 5.4 05/21/2018    Lipid Panel     Component Value Date/Time   CHOL 158 05/21/2018 1419   TRIG 89 05/21/2018 1419   HDL 62 05/21/2018 1419   CHOLHDL 2.5 05/21/2018 1419   CHOLHDL 2.8 04/01/2017 1330   LDLCALC 78 05/21/2018 1419   LDLCALC 92 04/01/2017 1330    BP Readings from Last 3 Encounters:  07/30/18 (!) 149/72  07/16/18 133/72  07/09/18 140/77    Allergies  Allergen Reactions  . Toviaz [Fesoterodine Fumarate Er] Other (See Comments)    Acid reflux    Medications Reviewed Today    Reviewed by Dorian Pod, [place] (Certified Medical Assistant) on 07/30/18 at 1214  Med List Status: <None>  Medication Order Taking? Sig Documenting Provider Last Dose Status Informant  amLODipine (NORVASC) 5 MG tablet [id number]  Take 1 tablet (5 mg total) by mouth daily. Rubye Beach  Active   aspirin EC 81 MG tablet [id number]  Take 81 mg by mouth daily. [provider]  Active Self  Bioflavonoid Products (ESTER C PO) [id number]  Take 2 tablets by mouth daily. [provider]  Active Self  Emollient (CERAVE) CREA [id number]  Apply 1 application topically 2 (two)  times daily as needed (itching).  [provider]  Active Self  escitalopram (LEXAPRO) 10 MG tablet [id number]  Take 1 tablet (10 mg total) by mouth at bedtime. Fenton Malling M, PA-C  Active   fluticasone [facility]) 50 MCG/ACT nasal spray [id number]  Place 1 spray into both nostrils 2 (two) times daily. Fenton Malling M, PA-C  Active   folic acid (FOLVITE) 1 MG tablet [id number]  Take 1 mg by mouth See admin instructions. Take 1 tablet (1 mg) by mouth daily at bedtime except on Saturday [provider]  Active Multiple Informants  hydrochlorothiazide (MICROZIDE) 12.5 MG capsule [id number]  Take 1 capsule (12.5 mg total) by mouth daily. Fenton Malling M, PA-C  Active   methotrexate (RHEUMATREX) 2.5 MG tablet [id number]  Take 10 mg by mouth every Saturday.  [provider]  Active Multiple Informants  mirabegron ER (MYRBETRIQ) 25 MG TB24 tablet [id number]  Take 1 tablet (25 mg total) by mouth daily. Zara Council A, PA-C  Active   naproxen sodium (ALEVE) 220 MG tablet [id number]  Take 220 mg by mouth daily as needed (pain/headache). [provider]  Active Self  OVER THE COUNTER MEDICATION [id number]  Take 1 packet by mouth 2 (two) times daily. Vitamin packet from [facility] for circulation - each packet contains 5 tablets [provider]  Active Self  Polyethyl Glycol-Propyl Glycol (SYSTANE OP) [id number]  Place 1 drop into both eyes 2 (two) times  daily. [provider]  Active Self  polyethylene glycol powder (GLYCOLAX/MIRALAX) powder [id number]  Take 17 g by mouth daily. [name], MD  Active Multiple Informants  tamsulosin (FLOMAX) 0.4 MG CAPS capsule [id number]  TAKE ONE CAPSULE BY MOUTH ONCE DAILY  Patient taking differently:  Take 0.4 mg by mouth daily.    [name], PA-C  Active Multiple Informants           Med Note Payton Doughty   VPX Apr 11, 2018  5:33 PM) #90 filled 12/18/17 [facility] - pt thinks he is still taking  "the medication from the urologist"  triamcinolone ointment (KENALOG) 0.5 % [id number]  Apply 1 application topically 2 (two) times daily. [name], MD  Active Multiple Informants  Zinc 50 MG TABS [id number]  Take 50 mg by mouth daily.  [provider]  Active Self           Assessment:   Total Number of meds: 11  (polypharmacy > 10 meds)  Indications for all medications: []  Yes       [x]  No  Prescriber and indication for MTX?   Adherence Review  []  Excellent (no doses missed/week)     []  Good (no more than 1 dose missed/week)     []  Partial (2-3 doses missed/week)     [x]  Poor (>3 doses missed/week)  Provided patient with adherence guide  Goals Addressed            This Visit's Progress   . Why am I on these medicines? (pt-stated)       Current Barriers:  [name] Knowledge deficit surrounding purpose and mechanism of medications  Pharmacist Clinical Goal(s): Over the next 30 days, Mr.. Burkett will verbalize understanding of medication plan as it relates to his plan of care.   Interventions: . Patient educated on purpose, proper use and potential adverse effects of Myrbetriq, various OTC supplements  . Reviewed medication list  . Patient provided with an easy to understand medication guide made by CCM pharmacist  Patient Self Care Activities:  . Take all medications as prescribed   *initial goal documentation        Time:  Time spent counseling patient: 75 Additional time spent on charting: 15 Review of patient status, including review of consultants reports, laboratory and other test data, was performed as part of comprehensive evaluation and provision of chronic care management services.   Plan: Recommendations discussed with provider - contact prescriber of MTX, document indication  Recommendations discussed with patient - take all medications as prescribed - Consider taking Myrbetriq for bladder symptoms to reduce your risk of  falling  Follow up: Telephone follow up appointment with CCM team member scheduled for: 7 days  [name], PharmD Clinical Pharmacist [place] [phone]

## 2018-09-08 ENCOUNTER — Ambulatory Visit: Payer: Medicare Other | Admitting: Urology

## 2018-09-08 DIAGNOSIS — M76892 Other specified enthesopathies of left lower limb, excluding foot: Secondary | ICD-10-CM | POA: Diagnosis not present

## 2018-09-08 DIAGNOSIS — G8929 Other chronic pain: Secondary | ICD-10-CM | POA: Diagnosis not present

## 2018-09-08 DIAGNOSIS — M1712 Unilateral primary osteoarthritis, left knee: Secondary | ICD-10-CM | POA: Diagnosis not present

## 2018-09-08 DIAGNOSIS — M25562 Pain in left knee: Secondary | ICD-10-CM | POA: Diagnosis not present

## 2018-09-09 ENCOUNTER — Telehealth: Payer: Medicare Other

## 2018-09-09 ENCOUNTER — Ambulatory Visit: Payer: Self-pay | Admitting: Pharmacist

## 2018-09-09 DIAGNOSIS — Z79899 Other long term (current) drug therapy: Secondary | ICD-10-CM

## 2018-09-09 DIAGNOSIS — R296 Repeated falls: Secondary | ICD-10-CM

## 2018-09-09 NOTE — Chronic Care Management (AMB) (Signed)
  Chronic Care Management   Note  09/09/2018 Name: [name] MRN: [id number] DOB: 1925/06/29  85 y.o. year old male referred to Chronic Care Management by Dr. Lavon Paganini for polypharmacy and nonadherence. Last office visit with [name], MD was  07/30/18. Initial CCM office visit was 09/02/18.   Was unable to reach patient via telephone today regarding medication adherence. Unfortunately, voicemail was full and unable to accept messages. (unsuccessful outreach #1).  Follow up in 1 week.  [name], PharmD Clinical Pharmacist [place] [phone]

## 2018-09-16 ENCOUNTER — Ambulatory Visit: Payer: Medicare Other | Admitting: Pharmacist

## 2018-09-16 DIAGNOSIS — Z79899 Other long term (current) drug therapy: Secondary | ICD-10-CM

## 2018-09-16 DIAGNOSIS — L309 Dermatitis, unspecified: Secondary | ICD-10-CM

## 2018-09-16 NOTE — Patient Instructions (Signed)
Goals Addressed            This Visit's Progress   . Why am I on these medicines? (pt-stated)       Current Barriers:  [name] Knowledge deficit surrounding purpose and mechanism of medications  Pharmacist Clinical Goal(s): Over the next 30 days, Mr.. Dery will verbalize understanding of medication plan as it relates to his plan of care.   Interventions: . Patient educated on purpose, proper use and potential adverse effects of Myrbetriq, various OTC supplements  . Reviewed medication list  . Patient provided with an easy to understand medication guide made by CCM pharmacist  Updated 09/16/18: Patient counseled on use and efficacy of echinacea for allergies  Patient Self Care Activities:  . Take all medications as prescribed   *initial goal documentation       The patient verbalized understanding of instructions provided today and declined a print copy of patient instruction materials.

## 2018-09-16 NOTE — Chronic Care Management (AMB) (Signed)
  Chronic Care Management   Follow Up Note   09/16/2018 Name: [name] MRN: [id number] DOB: 26-Apr-1925  Referred by: [name], MD Reason for referral : Chronic Care Management (Pharmacy follow up)  Subjective: [name] is a 85 y.o. year old male who is a primary care patient of Bacigalupo, Dionne Bucy, MD. The CCM team was consulted for assistance with chronic disease management and care coordination needs.    Review of patient status, including review of consultants reports, relevant laboratory and other test results, and collaboration with appropriate care team members and the patient's provider was performed as part of comprehensive patient evaluation and provision of chronic care management services.    Assessment: #Allergy season: Patient states he would like to start taking echinacea supplement for allergy season. Advised patient echinacea is typically used for colds and not significantly efficacious. Counseled patient to use generic flonase instead. Patient verbalized understanding.   Goals Addressed            This Visit's Progress   . Why am I on these medicines? (pt-stated)       Current Barriers:  [name] Knowledge deficit surrounding purpose and mechanism of medications  Pharmacist Clinical Goal(s): Over the next 30 days, Mr.. Cayer will verbalize understanding of medication plan as it relates to his plan of care.   Interventions: . Patient educated on purpose, proper use and potential adverse effects of Myrbetriq, various OTC supplements  . Reviewed medication list  . Patient provided with an easy to understand medication guide made by CCM pharmacist  Updated 09/16/18: Patient counseled on use and efficacy of echinacea for allergies  Patient Self Care Activities:  . Take all medications as prescribed   *initial goal documentation         Telephone follow up appointment with CCM team member scheduled for: [facility] 09/21/18   [name],  PharmD Clinical Pharmacist [place] [phone]

## 2018-09-17 DIAGNOSIS — L853 Xerosis cutis: Secondary | ICD-10-CM | POA: Diagnosis not present

## 2018-09-17 DIAGNOSIS — L308 Other specified dermatitis: Secondary | ICD-10-CM | POA: Diagnosis not present

## 2018-09-21 ENCOUNTER — Other Ambulatory Visit: Payer: Self-pay

## 2018-09-21 ENCOUNTER — Ambulatory Visit: Payer: Medicare Other

## 2018-09-21 DIAGNOSIS — R296 Repeated falls: Secondary | ICD-10-CM

## 2018-09-21 DIAGNOSIS — Z8673 Personal history of transient ischemic attack (TIA), and cerebral infarction without residual deficits: Secondary | ICD-10-CM

## 2018-09-21 NOTE — Chronic Care Management (AMB) (Addendum)
  Chronic Care Management   Follow Up Note   09/21/2018 Name: [name] MRN: [id number] DOB: [birth year]  Referred by: Jesus Crews, MD Reason for referral : Chronic Care Management (follow up on fall risk)   Subjective: "I have not fallen since I talked with you and I am doing everything I can to stay healthy"   Objective:  Assessment:  [name] a 85 y.o.year old malewho sees [name], MDfor primary care. The patient's wife, [name] the CCM team to consult with [name] for a referral to the CCM program.Referral was placed2/13/20. Today CCM RN CM followed up with patient via telephone to discuss progression towards previously established goals and to educate/answer any questions patient may have regarding COVID-19.  Goals Addressed            This Visit's Progress   . "I fall a lot" (pt-stated)   On track    [name] denies recent falls since being educated regarding fall risk and home safty. He is utilizing his Rolator with all ambulation. He makes sure he is aware of the cats location when standing or ambulating. His home is free from clutter. He continues to exercise at the retirement community gym and is utilizing CDC precautions related to COVID-19. There are no new/exhisting scatter rugs in the home. There home has modifications to prevent falls such as grab bars in the bath. It is well lit at night. He wears his securely fitting shoes with walking.  Current Barriers:  [name] Knowledge Deficits related to fall precautions . Lacks caregiver support.   Nurse Case Manager Clinical Goal(s):  [name] Over the next 60 days, patient will demonstrate improved adherence to prescribed treatment plan for decreasing falls as evidenced bypatient reporting and review of EMR . Over the next 30 days, patient will verbalize using fall risk reduction strategies discussed today  Interventions:  . Reviewed EMMI educational materials  related to preventing falls and how to safely get up from falls . Reinforced education to patient re: criteria for being a fall risk and reduction stratagies . Assessed for falls since last encounter. . Assessed patients knowledge of fall risk prevention secondary to previously provided education. . Provided emotional support and reassurance related to current pandemic of COVID-19 . Discussed current CDC recommendations for social distancing and hand hygiene.   Patient Self Care Activities:  . Utilize Rolator appropriately with all ambulation . De-clutter walkways . Wear secure fitting shoes at all times with ambulation . Utilize home lighting for dim lit areas . Have self and cat location awareness at all times . Follow CDC guideline for social distancing and hand hygiene.  Plan:  . RN CM will follow up with patient in 30 days  Please see past updates in goals section for documentation of goal progression          Telephone follow up appointment with CCM team member scheduled for: 30 days  Peggy Loge E. Rollene Rotunda, RN, BSN Nurse Care Coordinator [facility] Care Management [phone]

## 2018-09-21 NOTE — Patient Instructions (Addendum)
Thank you allowing the Chronic Care Management Team to be a part of your care! It was a pleasure speaking with you today!  1. Please make sure all clutter is removed from the floor in your home 2. Be very careful to not trip over your cat!!! 3. Make sure your Rolator is locked prior to sitting or standing 4. Make sure your home is well lit, especially during the night. Using night lights in your bedroom, bathroom, and hallways is a great idea 5. Change positions slowly. Changing positions rapidly could cause dizziness which could cause you to fall 6. Make sure you have good fitting shoes on at all times. 7. DO NOT BEND OVER TO PICK THINGS UP. THIS COULD CAUSE YOU TO LOOSE YOUR BALANCE AND FALL. 8. Consider purchasing a grabber to pick up small things off the floor. 9. Please follow CDC guideline for social distancing and hand hygrine as we discussed especially when you go to the gym daily. You may want to stop your workout if the gym gets crowded or if some near you appears ill.   Handwashing is one of the best ways to protect yourself and your family from getting sick. Wash Your Hands Often to Stay Healthy  When: You can help yourself and your loved ones stay healthy by washing your hands often, especially during these key times when you are likely to get and spread germs: . Before, during, and after preparing food  . Before eating food  . Before and after caring for someone at home who is sick with vomiting or diarrhea  . Before and after treating a cut or wound  . After using the toilet  . After being in public spaces . After changing diapers or cleaning up a child who has used the toilet  . After blowing your nose, coughing, or sneezing  . After touching an animal, animal feed, or animal waste  . After handling pet food or pet treats  . After touching garbage . After touching public doors, telephones, shopping carts, or other surfaces .  Five Steps to [place] the Right  Way 1. Wet your hands with clean, running water (warm or cold), turn off the tap, and apply soap. 2. Lather your hands by rubbing them together with the soap. Lather the backs of your hands, between your fingers, and under your nails. 3. Scrub your hands for at least 20 seconds. Need a timer? Hum the "Happy Birthday" song from beginning to end twice. 4. Rinse your hands well under clean, running water. 5. Dry your hands using a clean towel or air dry them.  Source: BridgeDigest.is  For information about COVID-19 or "Corona Virus", the following web resources may be helpful:  CDC: BeginnerSteps.be    [place]:  [email or web address]  CCM (Chronic Care Management) Team   Trish Fountain RN, BSN Nurse Care Coordinator  [phone]  [name] PharmD  Clinical Pharmacist  [phone]   [place], [place] Social Worker [phone]  Goals Addressed            This Visit's Progress   . "I fall a lot" (pt-stated)   On track    Current Barriers:  [name] Knowledge Deficits related to fall precautions . Lacks caregiver support.   Nurse Case Manager Clinical Goal(s):  [name] Over the next 60 days, patient will demonstrate improved adherence to prescribed treatment plan for decreasing falls as evidenced bypatient reporting and review of EMR . Over the next  30 days, patient will verbalize using fall risk reduction strategies discussed today  Interventions:  . Reviewed EMMI educational materials related to preventing falls and how to safely get up from falls . Reinforced education to patient re: criteria for being a fall risk and reduction stratagies . Assessed for falls since last encounter. . Assessed patients knowledge of fall risk prevention secondary to previously provided education. . Provided  emotional support and reassurance related to current pandemic of COVID-19 . Discussed current CDC recommendations for social distancing and hand hygiene.   Patient Self Care Activities:  . Utilize Rolator appropriately with all ambulation . De-clutter walkways . Wear secure fitting shoes at all times with ambulation . Utilize home lighting for dim lit areas . Have self and cat location awareness at all times . Follow CDC guideline for social distancing and hand hygiene.  Plan:  . RN CM will follow up with patient in 30 days  Please see past updates in goals section for documentation of goal progression         The patient verbalized understanding of instructions provided today and declined a print copy of patient instruction materials.   Telephone follow up appointment with CCM team member scheduled for: 30 days

## 2018-09-23 ENCOUNTER — Ambulatory Visit: Payer: Self-pay | Admitting: Pharmacist

## 2018-09-23 ENCOUNTER — Other Ambulatory Visit: Payer: Self-pay

## 2018-09-23 ENCOUNTER — Telehealth: Payer: Self-pay

## 2018-09-23 DIAGNOSIS — Z79899 Other long term (current) drug therapy: Secondary | ICD-10-CM

## 2018-09-23 DIAGNOSIS — I10 Essential (primary) hypertension: Secondary | ICD-10-CM

## 2018-09-23 NOTE — Patient Instructions (Signed)
Goals Addressed            This Visit's Progress   . I have a lot of pills left over (pt-stated)       Current Barriers:  . Non compliance . Knowledge deficit related to medication regimen  Pharmacist Clinical Goal(s): Over the next 90 days, Mr. Koziol will be adherent to all medications as evidenced by a percentage of days covered [facility]) report of >80%.   Interventions: . Patient educated on purpose and proper use of HCTZ . Reviewed medication regimen  Patient Self Care Activities:  . Take all prescriptions as prescrived   Initial goal documentation         The patient verbalized understanding of instructions provided today and declined a print copy of patient instruction materials.

## 2018-09-23 NOTE — Chronic Care Management (AMB) (Signed)
  Chronic Care Management   Follow Up Note   09/23/2018 Name: [name] MRN: [id number] DOB: August 31, 1924  Referred by: [name], MD Reason for referral : Chronic Care Management (Medication adherence)   [name] is a 85 y.o. year old male who is a primary care patient of Bacigalupo, Dionne Bucy, MD. The CCM team was consulted for medication adherence (HCTZ)   Received a letter from patient's insurance regarding potential clinical concern: Non-adherence to hydrochlorothiazide 12.5mg  one tablet once daily. Letter sent to [name], MD who advised to counsel patient on the risk. Copy of letter uploaded to media tab.    I called patient and reviewed the potential adverse effects of non compliance with HTN medications.  Patient reports he has been tolerating the medication well, last fill per bottle was 06/26/2018.     Goals Addressed            This Visit's Progress   . I have a lot of pills left over (pt-stated)       Current Barriers:  . Non compliance . Knowledge deficit related to medication regimen  Pharmacist Clinical Goal(s): Over the next 90 days, [name] will be adherent to all medications as evidenced by a percentage of days covered [facility]) report of >80%.   Interventions: . Patient educated on purpose and proper use of HCTZ . Reviewed medication regimen  Patient Self Care Activities:  . Take all prescriptions as prescrived   Initial goal documentation          Telephone follow up appointment with CCM team member scheduled for:  2 weeks  [name], PharmD Clinical Pharmacist [place] [phone]

## 2018-10-01 ENCOUNTER — Ambulatory Visit: Payer: Self-pay | Admitting: Pharmacist

## 2018-10-01 DIAGNOSIS — Z79899 Other long term (current) drug therapy: Secondary | ICD-10-CM

## 2018-10-01 DIAGNOSIS — R296 Repeated falls: Secondary | ICD-10-CM

## 2018-10-01 NOTE — Chronic Care Management (AMB) (Signed)
  Chronic Care Management   Follow Up Note   10/01/2018 Name: [name] MRN: [id number] DOB: 1925-06-13  Referred by: Jesus Crews, MD [place] for referral : Chronic Care Management (Medication Management)   [name] is a 85 y.o. year old male who is a primary care patient of Bacigalupo, Dionne Bucy, MD. The CCM team was consulted for assistance with chronic disease management and care coordination needs.    Review of patient status, including review of consultants reports, relevant laboratory and other test results, and collaboration with appropriate care team members and the patient's provider was performed as part of comprehensive patient evaluation and provision of chronic care management services.    Goals Addressed            This Visit's Progress   . I have a lot of pills left over (pt-stated)       Current Barriers:  . Non compliance . Knowledge deficit related to medication regimen  Pharmacist Clinical Goal(s): Over the next 90 days, [name] will be adherent to all medications as evidenced by a percentage of days covered [facility]) report of >80%.   Interventions: . Patient educated on purpose and proper use of HCTZ . Removed MTX and folic acid from medication list (completed course per dermatologist); patient does not wish to request refill for gabapentin . Reviewed medication regimen  Patient Self Care Activities:  . Take all prescriptions as prescribed  . Utilize medication chart when taking medications  Please see past updates related to this goal by clicking on the "Past Updates" button in the selected goal          Plan:  -removed MTX and folic acid from medication list (discontinued by [name]) -counseling on folic acid -patient does not wish to refill gabapentin ([name]) -had recent fall while walking with rollator- one leg of rollator got stuck on clump of grass  Telephone follow up appointment with CCM team member  scheduled for: April 1 with PharmD as scheduled  [name], PharmD Clinical Pharmacist [place] [phone]

## 2018-10-02 NOTE — Patient Instructions (Signed)
Goals Addressed            This Visit's Progress   . I have a lot of pills left over (pt-stated)       Current Barriers:  . Non compliance . Knowledge deficit related to medication regimen  Pharmacist Clinical Goal(s): Over the next 90 days, Mr. Shein will be adherent to all medications as evidenced by a percentage of days covered [facility]) report of >80%.   Interventions: . Patient educated on purpose and proper use of HCTZ . Removed MTX and folic acid from medication list (completed course per dermatologist); patient does not wish to request refill for gabapentin . Reviewed medication regimen  Patient Self Care Activities:  . Take all prescriptions as prescribed  . Utilize medication chart when taking medications  Please see past updates related to this goal by clicking on the "Past Updates" button in the selected goal          The patient verbalized understanding of instructions provided today and declined a print copy of patient instruction materials.

## 2018-10-05 ENCOUNTER — Ambulatory Visit (INDEPENDENT_AMBULATORY_CARE_PROVIDER_SITE_OTHER): Payer: Medicare Other

## 2018-10-05 DIAGNOSIS — I10 Essential (primary) hypertension: Secondary | ICD-10-CM | POA: Diagnosis not present

## 2018-10-05 DIAGNOSIS — R7303 Prediabetes: Secondary | ICD-10-CM

## 2018-10-05 DIAGNOSIS — R296 Repeated falls: Secondary | ICD-10-CM

## 2018-10-05 NOTE — Chronic Care Management (AMB) (Signed)
  Chronic Care Management   Follow Up Note   10/05/2018 Name: [name] MRN: [id number] DOB: 1924-11-13  Referred by: Jesus Crews, MD Reason for referral : Chronic Care Management (follow up recent fall per wife)   Subjective: "It was not like a big fall, I just lost my balance because my Rolator brakes would not work on the wet grass"   Objective:  Assessment: [name] a 85 y.o.year old malewho sees [name], MDfor primary care. The patient's wife, [name] the CCM team to consult with [name] for a referral to the CCM program.Referral was placed2/13/20.Today CCM RN CM followed up with patient via telephone to discuss reported fall by wife.  [name] states he had a "minor fall" last week while ambulating outdoors for exercise. According to [name], he can no longer walk outdoors where there are rest benches secondary to community COVID-19 restrictions. [name] and wife live in a retirement community. [name] was ambulating in a rough touraine area with wet grass and describes his Rolator as "running away". He was unable to keep up with the Rolator and subsequently fell. He sustained a bruise on his leg that is now healing. He is able to verbalize cause of fall and with CCM RN CM develop an action plan to prevent additional falls including not ambulating on wet uneven ground.  Goals Addressed            This Visit's Progress   . "I fall a lot" (pt-stated)   On track    Current Barriers:  [name] Knowledge Deficits related to fall precautions . Lacks caregiver support.   . Decreased adherence to prescribed treatment for fall prevention  Nurse Case Manager Clinical Goal(s):  [name] Over the next 60 days, patient will demonstrate improved adherence to prescribed treatment plan for decreasing falls as evidenced bypatient reporting and review of EMR . Over the next 30 days, patient will verbalize using fall  risk reduction strategies discussed today . Over the next 30 days, patient will not experience additional falls  Interventions:  . Reviewed fall precautions stratagies . Reinforced education to patient re: criteria for being a fall risk and reduction stratagies . Assessed for falls since last encounter. . Assessed patients knowledge of fall risk prevention secondary to previously provided education. . Assessed working status of life alert bracelet  Patient Self Care Activities:  . Utilize Rolator appropriately with all ambulation . De-clutter walkways . Wear secure fitting shoes at all times with ambulation . Utilize home lighting for dim lit areas . Have self and cat location awareness at all times . Follow CDC guideline for social distancing and hand hygiene.  Plan:  . RN CM will follow up with patient in 30 days  Please see past updates in goals section for documentation of goal progression          Telephone follow up appointment with CCM team member scheduled for: 30 days   Brionna Romanek E. Rollene Rotunda, RN, BSN Nurse Care Coordinator [facility] [phone]

## 2018-10-06 NOTE — Patient Instructions (Addendum)
Thank you allowing the Chronic Care Management Team to be a part of your care! It was a pleasure speaking with you today!  1. Please follow all fall precautions discussed today. I am happy you are eager to exercise but we have to do it safely. 2. Use your Rolator at all times with ambulation 3. Wear good fitting shoes and make sure strings are double knotted 4. DO NOT walk outdoors when wet 5. Utilize seated exercises as discussed for mobility improvement and cardiovascular health 6. Continue to follow COVID-19 infection prevention stratagies  CCM (Chronic Care Management) Team   Trish Fountain RN, BSN Nurse Care Coordinator  [phone]  [name] PharmD  Clinical Pharmacist  [phone]   Elliot Gurney, LCSW Clinical Social Worker [phone]  Goals Addressed            This Visit's Progress   . "I fall a lot" (pt-stated)   Not on track    Current Barriers:  [name] Knowledge Deficits related to fall precautions . Lacks caregiver support.   . Decreased adherence to prescribed treatment for fall prevention  Nurse Case Manager Clinical Goal(s):  [name] Over the next 60 days, patient will demonstrate improved adherence to prescribed treatment plan for decreasing falls as evidenced bypatient reporting and review of EMR . Over the next 30 days, patient will verbalize using fall risk reduction strategies discussed today . Over the next 30 days, patient will not experience additional falls  Interventions:  . Reviewed fall precautions stratagies . Reinforced education to patient re: criteria for being a fall risk and reduction stratagies . Assessed for falls since last encounter. . Assessed patients knowledge of fall risk prevention secondary to previously provided education. . Assessed working status of life alert bracelet  Patient Self Care Activities:  . Utilize Rolator appropriately with all ambulation . De-clutter walkways . Wear secure fitting shoes at all times  with ambulation . Utilize home lighting for dim lit areas . Have self and cat location awareness at all times . Follow CDC guideline for social distancing and hand hygiene.  Plan:  . RN CM will follow up with patient in 30 days  Please see past updates in goals section for documentation of goal progression       The patient verbalized understanding of instructions provided today and declined a print copy of patient instruction materials.   Telephone follow up appointment with CCM team member scheduled for: 30 days   Exercises To Do While Sitting  Exercises that you do while sitting (chair exercises) can give you many of the same benefits as full exercise. Benefits include strengthening your heart, burning calories, and keeping muscles and joints healthy. Exercise can also improve your mood and help with depression and anxiety. You may benefit from chair exercises if you are unable to do standing exercises because of:  Diabetic foot pain.  Obesity.  Illness.  Arthritis.  Recovery from surgery or injury.  Breathing problems.  Balance problems.  Another type of disability. Before starting chair exercises, check with your health care provider or a physical therapist to find out how much exercise you can tolerate and which exercises are safe for you. If your health care provider approves:  Start out slowly and build up over time. Aim to work up to about 10-20 minutes for each exercise session.  Make exercise part of your daily routine.  Drink water when you exercise. Do not wait until you are thirsty. Drink every 10-15 minutes.  Stop exercising right away if you have pain, nausea, shortness of breath, or dizziness.  If you are exercising in a wheelchair, make sure to lock the wheels.  Ask your health care provider whether you can do tai chi or yoga. Many positions in these mind-body exercises can be modified to do while seated. Warm-up Before starting other  exercises: 1. Sit up as Leser as you can. Have your knees bent at 90 degrees, which is the shape of the capital letter "L." Keep your feet flat on the floor. 2. Sit at the front edge of your chair, if you can. 3. Pull in (tighten) the muscles in your abdomen and stretch your spine and neck as Hyland as you can. Hold this position for a few minutes. 4. Breathe in and out evenly. Try to concentrate on your breathing, and relax your mind. Stretching Exercise A: Arm stretch 1. Hold your arms out Parcher in front of your body. 2. Bend your hands at the wrist with your fingers pointing up, as if signaling someone to stop. Notice the slight tension in your forearms as you hold the position. 3. Keeping your arms out and your hands bent, rotate your hands outward as far as you can and hold this stretch. Aim to have your thumbs pointing up and your pinkie fingers pointing down. Slowly repeat arm stretches for one minute as tolerated. Exercise B: Leg stretch 1. If you can move your legs, try to "draw" letters on the floor with the toes of your foot. Write your name with one foot. 2. Write your name with the toes of your other foot. Slowly repeat the movements for one minute as tolerated. Exercise C: Reach for the sky 1. Reach your hands as far over your head as you can to stretch your spine. 2. Move your hands and arms as if you are climbing a rope. Slowly repeat the movements for one minute as tolerated. Range of motion exercises Exercise A: Shoulder roll 1. Let your arms hang loosely at your sides. 2. Lift just your shoulders up toward your ears, then let them relax back down. 3. When your shoulders feel loose, rotate your shoulders in backward and forward circles. Do shoulder rolls slowly for one minute as tolerated. Exercise B: March in place 1. As if you are marching, pump your arms and lift your legs up and down. Lift your knees as high as you can. ? If you are unable to lift your knees,  just pump your arms and move your ankles and feet up and down. March in place for one minute as tolerated. Exercise C: Seated jumping jacks 1. Let your arms hang down [place]. 2. Keeping your arms Buckner, lift them up over your head. Aim to point your fingers to the ceiling. 3. While you lift your arms, straighten your legs and slide your heels along the floor to your sides, as wide as you can. 4. As you bring your arms back down to your sides, slide your legs back together. ? If you are unable to use your legs, just move your arms. Slowly repeat seated jumping jacks for one minute as tolerated. Strengthening exercises Exercise A: Shoulder squeeze 1. Hold your arms Cody out from your body to your sides, with your elbows bent and your fists pointed at the ceiling. 2. Keeping your arms in the bent position, move them forward so your elbows and forearms meet in front of your face. 3. Open your arms back out as wide as  you can with your elbows still bent, until you feel your shoulder blades squeezing together. Hold for 5 seconds. Slowly repeat the movements forward and backward for one minute as tolerated. Contact a health care provider if you:  Had to stop exercising due to any of the following: ? Pain. ? Nausea. ? Shortness of breath. ? Dizziness. ? Fatigue.  Have significant pain or soreness after exercising. Get help right away if you have:  Chest pain.  Difficulty breathing. These symptoms may represent a serious problem that is an emergency. Do not wait to see if the symptoms will go away. Get medical help right away. Call your local emergency services (911 in the [place] Do not drive yourself to the hospital. This information is not intended to replace advice given to you by your health care provider. Make sure you discuss any questions you have with your health care provider. Document Released: 05/07/2017 Document Revised: 05/07/2017 Document Reviewed: 05/07/2017 Elsevier  Interactive Patient Education  2019 [employer].

## 2018-10-07 ENCOUNTER — Telehealth: Payer: Medicare Other

## 2018-10-09 ENCOUNTER — Telehealth: Payer: Self-pay

## 2018-10-12 ENCOUNTER — Encounter: Payer: Medicare Other | Admitting: Pharmacist

## 2018-10-12 ENCOUNTER — Other Ambulatory Visit: Payer: Self-pay

## 2018-10-26 ENCOUNTER — Other Ambulatory Visit: Payer: Self-pay

## 2018-10-26 ENCOUNTER — Ambulatory Visit (INDEPENDENT_AMBULATORY_CARE_PROVIDER_SITE_OTHER): Payer: Medicare Other

## 2018-10-26 DIAGNOSIS — R296 Repeated falls: Secondary | ICD-10-CM

## 2018-10-26 DIAGNOSIS — I10 Essential (primary) hypertension: Secondary | ICD-10-CM

## 2018-10-26 NOTE — Patient Instructions (Signed)
  Thank you allowing the Chronic Care Management Team to be a part of your care! It was a pleasure speaking with you today!  1. Please follow all fall precautions discussed today. 2. Continue to exercise daily but use safety strategies when ambulating outdoors 3. Use your Rolator at all times with ambulation 4. Wear good fitting shoes and make sure strings are double knotted 5. DO NOT walk outdoors when wet 6. Make sure you use your safety alert button when/if needed and DO NOT try to get up on your own or have Ms. Margaret assist you up. 7. Utilize seated exercises as discussed for mobility improvement and cardiovascular health 8. Continue to follow COVID-19 infection prevention stratagies  CCM (Chronic Care Management) Team   Trish Fountain RN, BSN Nurse Care Coordinator  [phone]  [name] PharmD  Clinical Pharmacist  [phone]   Elliot Gurney, LCSW Clinical Social Worker [phone]  Goals Addressed            This Visit's Progress   . "I fall a lot" (pt-stated)   On track    Current Barriers:  [name] Knowledge Deficits related to fall precautions . Lacks caregiver support.   . Decreased adherence to prescribed treatment for fall prevention  Nurse Case Manager Clinical Goal(s):  [name] Over the next 60 days, patient will demonstrate improved adherence to prescribed treatment plan for decreasing falls as evidenced bypatient reporting and review of EMR . Over the next 30 days, patient will verbalize using fall risk reduction strategies discussed today-goal met 10/26/2018 . Over the next 30 days, patient will not experience additional falls-goal met 10/26/2018  Interventions:  . Reviewed and reinforced fall precautions stratagies . Assessed for falls since last encounter. . Assessed patients knowledge of fall risk prevention secondary to previously provided education. . Assessed working status of life alert bracelet  Patient Self Care Activities:  . Utilize  Rolator appropriately with all ambulation . De-clutter walkways . Wear secure fitting shoes at all times with ambulation . Utilize home lighting for dim lit areas . Have self and cat location awareness at all times . Follow CDC guideline for social distancing and hand hygiene.  Plan:  . RN CM will follow up with patient in 30 days  Please see past updates in goals section for documentation of goal progression         The patient verbalized understanding of instructions provided today and declined a print copy of patient instruction materials.   Telephone follow up appointment with CCM team member scheduled for: 30 days

## 2018-10-26 NOTE — Chronic Care Management (AMB) (Signed)
  Chronic Care Management   Follow Up Note   10/26/2018 Name: [name] MRN: [id number] DOB: Jan 29, 1925  Referred by: Jesus Crews, MD Reason for referral : Chronic Care Management (follow up fall)   Subjective: "I have not fallen since I talked with you last"   Objective:  Assessment: [name] a 85 y.o.year old malewho sees [name], MDfor primary care. The patient's wife, [name] for a referral to the CCM program.Referral was placed2/13/20.TodayCCM RN CM followed up with patient via telephone to discuss ongoing fall preventy strategies and assess for new falls.  Review of patient status, including review of consultants reports, relevant laboratory and other test results, and collaboration with appropriate care team members and the patient's provider was performed as part of comprehensive patient evaluation and provision of chronic care management services.    Goals Addressed            This Visit's Progress   . "I fall a lot" (pt-stated)   On track    Mr. Word denies falls since out last encounter. He reports being more cautious when ambulating and utilizing his Rolator more. He is able to verbalize fall prevention stratagies previously discussed.   Current Barriers:  [name] Knowledge Deficits related to fall precautions . Lacks caregiver support.   . Decreased adherence to prescribed treatment for fall prevention  Nurse Case Manager Clinical Goal(s):  [name] Over the next 60 days, patient will demonstrate improved adherence to prescribed treatment plan for decreasing falls as evidenced bypatient reporting and review of EMR . Over the next 30 days, patient will verbalize using fall risk reduction strategies discussed today-goal met 10/26/2018 . Over the next 30 days, patient will not experience additional falls-goal met 10/26/2018  Interventions:  . Reviewed and reinforced  fall precautions stratagies . Assessed for falls since last encounter. . Assessed patients knowledge of fall risk prevention secondary to previously provided education. . Assessed working status of life alert bracelet  Patient Self Care Activities:  . Utilize Rolator appropriately with all ambulation . De-clutter walkways . Wear secure fitting shoes at all times with ambulation . Utilize home lighting for dim lit areas . Have self and cat location awareness at all times . Follow CDC guideline for social distancing and hand hygiene.  Plan:  . RN CM will follow up with patient in 30 days  Please see past updates in goals section for documentation of goal progression          Telephone follow up appointment with CCM team member scheduled for: 30 days   Jesus Nouri E. Rollene Rotunda, RN, BSN Nurse Care Coordinator [facility] [phone]

## 2018-10-27 ENCOUNTER — Encounter: Payer: Self-pay | Admitting: Pharmacist

## 2018-11-19 ENCOUNTER — Ambulatory Visit: Payer: Medicare Other

## 2018-11-19 ENCOUNTER — Encounter: Payer: Medicare Other | Admitting: Family Medicine

## 2018-11-19 ENCOUNTER — Ambulatory Visit: Payer: Medicare Other | Admitting: Family Medicine

## 2018-11-23 ENCOUNTER — Ambulatory Visit (INDEPENDENT_AMBULATORY_CARE_PROVIDER_SITE_OTHER): Payer: Medicare Other

## 2018-11-23 ENCOUNTER — Other Ambulatory Visit: Payer: Self-pay

## 2018-11-23 DIAGNOSIS — I10 Essential (primary) hypertension: Secondary | ICD-10-CM | POA: Diagnosis not present

## 2018-11-23 DIAGNOSIS — R296 Repeated falls: Secondary | ICD-10-CM

## 2018-11-23 DIAGNOSIS — R7303 Prediabetes: Secondary | ICD-10-CM

## 2018-11-23 NOTE — Chronic Care Management (AMB) (Signed)
Chronic Care Management   Follow Up Note   11/23/2018 Name: [name] MRN: [id number] DOB: 11-09-1924  Referred by: Jesus Crews, MD Reason for referral : Chronic Care Management (follow up on falls)   Subjective: "I am doing very well thank you"   Objective:  BP Readings from Last 3 Encounters:  07/30/18 (!) 149/72  07/16/18 133/72  07/09/18 140/77    Assessment: [name] is a 85 y.o. year old male who sees [name], Jesus Bucy, MD for primary care. The patient's wife, [name] asked the CCM team to consult with [name] for a referral to the CCM program. Referral was placed 08/20/18. Today CCM RN CM followed up with patient via telephone to discuss ongoing fall preventy strategies and assess for new falls.   Review of patient status, including review of consultants reports, relevant laboratory and other test results, and collaboration with appropriate care team members and the patient's provider was performed as part of comprehensive patient evaluation and provision of chronic care management services.    Goals Addressed            This Visit's Progress    COMPLETED: "I fall a lot" (pt-stated)       [name] has not sustained any new falls since last encounter. He is able to verbalize all fall risk strategies previously discussed including no ambulation without Rolator. He states he is able to access the "community nurse" when needed and has her number on the refrigerator.    Current Barriers:   Knowledge Deficits related to fall precautions  Lacks caregiver support.    Decreased adherence to prescribed treatment for fall prevention  Nurse Case Manager Clinical Goal(s):   Over the next 60 days, patient will demonstrate improved adherence to prescribed treatment plan for decreasing falls as evidenced bypatient reporting and review of EMR  Over the next 30 days, patient will verbalize using fall risk reduction strategies  discussed today-goal met 10/26/2018  Over the next 30 days, patient will not experience additional falls-goal met 10/26/2018  Interventions:   Reviewed and reinforced fall precautions stratagies  Assessed for falls since last encounter.  Assessed patients knowledge of fall risk prevention secondary to previously provided education.  Assessed working status of life alert bracelet  Patient Self Care Activities:   Scientist, product/process development appropriately with all ambulation  De-clutter walkways  Wear secure fitting shoes at all times with ambulation  Utilize home lighting for dim lit areas  Have self and cat location awareness at all times  Follow CDC guideline for social distancing and hand hygiene.  Plan:   RN CM will follow up with patient in 30 days  Please see past updates in goals section for documentation of goal progression       I think I am doing ok but can you call me back just to check on me (pt-stated)       Current Barriers:   Chronic Disease Management support and education needs related to frequent falls  Nurse Case Manager Clinical Goal(s):   Over the next 30 days, the patient will demonstrate ongoing self health care management ability as evidenced by adhering to all fall precautions previously discussed and not sustaining additional falls  Interventions:   Discussed plans with patient for ongoing care management follow up and provided patient with direct contact information for care management team   Provided patient with RN CM contact information  Reinforced previously discussed fall precautions  Patient Self Care  Activities:   Contact CCM RN CM if additional needs arise  Initial goal documentation        Telephone follow up appointment with CCM team member scheduled for: 30 days   [name] E. Rollene Rotunda, RN, BSN Nurse Care Coordinator [facility] [phone]

## 2018-11-23 NOTE — Patient Instructions (Signed)
Thank you allowing the Chronic Care Management Team to be a part of your care! It was a pleasure speaking with you today!  1. Continue to follow the fall prevention strategies we have discussed. 2. I will follow up with you in 30 days as you requested.  3. You have been provided with CCM Team contact information. Please call RN CM if additional needs arise prior to 30 day follow up  CCM (Chronic Care Management) Team   Trish Fountain RN, BSN Nurse Care Coordinator  [phone]  [name] PharmD  Clinical Pharmacist  [phone]   [place], LCSW Clinical Social Worker [phone]  Goals Addressed            This Visit's Progress   . COMPLETED: "I fall a lot" (pt-stated)       Current Barriers:  [name] Knowledge Deficits related to fall precautions . Lacks caregiver support.   . Decreased adherence to prescribed treatment for fall prevention  Nurse Case Manager Clinical Goal(s):  [name] Over the next 60 days, patient will demonstrate improved adherence to prescribed treatment plan for decreasing falls as evidenced bypatient reporting and review of EMR . Over the next 30 days, patient will verbalize using fall risk reduction strategies discussed today-goal met 10/26/2018 . Over the next 30 days, patient will not experience additional falls-goal met 10/26/2018  Interventions:  . Reviewed and reinforced fall precautions stratagies . Assessed for falls since last encounter. . Assessed patients knowledge of fall risk prevention secondary to previously provided education. . Assessed working status of life alert bracelet  Patient Self Care Activities:  . Utilize Rolator appropriately with all ambulation . De-clutter walkways . Wear secure fitting shoes at all times with ambulation . Utilize home lighting for dim lit areas . Have self and cat location awareness at all times . Follow CDC guideline for social distancing and hand hygiene.  Plan:  . RN CM will follow up  with patient in 30 days  Please see past updates in goals section for documentation of goal progression      . I think I am doing ok but can you call me back just to check on me (pt-stated)       Current Barriers:  . Chronic Disease Management support and education needs related to frequent falls  Nurse Case Manager Clinical Goal(s):  [name] Over the next 30 days, the patient will demonstrate ongoing self health care management ability as evidenced by adhering to all fall precautions previously discussed and not sustaining additional falls  Interventions:  . Discussed plans with patient for ongoing care management follow up and provided patient with direct contact information for care management team  . Provided patient with RN CM contact information . Reinforced previously discussed fall precautions  Patient Self Care Activities:  . Contact CCM RN CM if additional needs arise  Initial goal documentation       The patient verbalized understanding of instructions provided today and declined a print copy of patient instruction materials.   Telephone follow up appointment with CCM team member scheduled for:30 days  SYMPTOMS OF A STROKE   You have any symptoms of stroke. "BE FAST" is an easy way to remember the main warning signs: ? B - Balance. Signs are dizziness, sudden trouble walking, or loss of balance. ? E - Eyes. Signs are trouble seeing or a sudden change in how you see. ? F - Face. Signs are sudden weakness or loss of feeling of the  face, or the face or eyelid drooping on one side. ? A - Arms. Signs are weakness or loss of feeling in an arm. This happens suddenly and usually on one side of the body. ? S - Speech. Signs are sudden trouble speaking, slurred speech, or trouble understanding what people say. ? T - Time. Time to call emergency services. Write down what time symptoms started.  You have other signs of stroke, such as: ? A sudden, very bad headache with no known  cause. ? Feeling sick to your stomach (nausea). ? Throwing up (vomiting). ? Jerky movements you cannot control (seizure).  SYMPTOMS OF A HEART ATTACK  What are the signs or symptoms? Symptoms of this condition include:  Chest pain. It may feel like: ? Crushing or squeezing. ? Tightness, pressure, fullness, or heaviness.  Pain in the arm, neck, jaw, back, or upper body.  Shortness of breath.  Heartburn.  Indigestion.  Nausea.  Cold sweats.  Feeling tired.  Sudden lightheadedness.

## 2018-12-15 ENCOUNTER — Ambulatory Visit: Payer: Medicare Other

## 2018-12-15 ENCOUNTER — Other Ambulatory Visit: Payer: Self-pay

## 2018-12-15 DIAGNOSIS — R7303 Prediabetes: Secondary | ICD-10-CM

## 2018-12-15 DIAGNOSIS — R296 Repeated falls: Secondary | ICD-10-CM

## 2018-12-15 NOTE — Chronic Care Management (AMB) (Signed)
Chronic Care Management   Follow Up Note   12/15/2018 Name: [name] MRN: [id number] DOB: 1924-10-21  Referred by: [name], MD Reason for referral : Chronic Care Management (follow up falls/nutrition)   Subjective: "I don't want to have diabetes so I don't eat any salt, I read all the labels, and I don't eat any saturated fat if I can help it". Per wife, [name] is limiting his diet so much that he is loosing weight.   Objective:  Wt Readings from Last 3 Encounters:  07/30/18 167 lb (75.8 kg)  07/16/18 162 lb (73.5 kg)  07/09/18 162 lb 9.6 oz (73.8 kg)    Assessment:  [name] is a 85 y.o. year old male who sees Bacigalupo, Dionne Bucy, MD for primary care. The patient's wife, [name] asked the CCM team to consult with Dr. Brita Romp for a referral to the CCM program. Referral was placed 08/20/18. Today CCM RN CM followed up with patient via telephone to discuss ongoing fall preventy strategies and assess for new falls and to address wife's concern regarding [name] restrictive diet.  Review of patient status, including review of consultants reports, relevant laboratory and other test results, and collaboration with appropriate care team members and the patient's provider was performed as part of comprehensive patient evaluation and provision of chronic care management services.    Goals Addressed            This Visit's Progress    I think I am doing ok but can you call me back just to check on me (pt-stated)       [name] states he is doing well and has not sustained a fall since our last encounter. Per wife, she request RN CM discuss with patient his extremely restrictive diet. Wife states patient is loosing weight because of "eating too healthy". RN CM spoke with [name] about his diet. He is concerned that he will develop diabetes and have to take DM medications. He is restricting most sugars, all sodium, all saturated fats  including cashews. He consumes low fat foods as written on the labels. He and wife take many supplements although they have been advised by Clinic Pharmacist to discontinue. They read health magazines and do not take into account their age and nutritional requirements. [name] does not feel he is loosing weight but admits his close are "loose".    Current Barriers:   Chronic Disease Management support and education needs related to frequent falls  Nurse Case Manager Clinical Goal(s):   Over the next 30 days, the patient will demonstrate ongoing self health care management ability as evidenced by adhering to all fall precautions previously discussed and not sustaining additional falls  Interventions:   Discussed plans with patient for ongoing care management follow up and provided patient with direct contact information for care management team   Provided patient with RN CM contact information  Reinforced previously discussed fall precautions  Discussed nutritional status and wife's concern for patients extreme nutritional restrictions   Provided patient with educational information related to complications of malnutrition  Patient Self Care Activities:   Contact CCM RN CM if additional needs arise  Lessen dietary restrictions to reduce weight loss  Please see past updates related to this goal by clicking on the "Past Updates" button in the selected goal         Telephone follow up appointment with care management team member scheduled for:30 days   [facility]  Dickie La, RN, BSN Nurse Care Coordinator [facility] Care Management [phone]

## 2018-12-15 NOTE — Patient Instructions (Signed)
Thank you allowing the Chronic Care Management Team to be a part of your care! It was a pleasure speaking with you today!  1. Please take all your medications as prescribed. 2. Adhere to fall precautions we have discussed during each visit.  3. Please do not restrict your diet as much. You need protein to make antibodies to fight infections and our bodies do need fats. I would focus more on eating a well balanced whole food diet.   CCM (Chronic Care Management) Team   Trish Fountain RN, BSN Nurse Care Coordinator  [phone]  [name] PharmD  Clinical Pharmacist  [phone]   Elliot Gurney, LCSW Clinical Social Worker [phone]  Goals Addressed            This Visit's Progress   . I think I am doing ok but can you call me back just to check on me (pt-stated)       Current Barriers:  . Chronic Disease Management support and education needs related to frequent falls  Nurse Case Manager Clinical Goal(s):  [name] Over the next 30 days, the patient will demonstrate ongoing self health care management ability as evidenced by adhering to all fall precautions previously discussed and not sustaining additional falls . Over the next 30 days, patient will report adhering to a less restrictive diet to promote weight maintenance instead of weight loss  Interventions:  . Discussed plans with patient for ongoing care management follow up and provided patient with direct contact information for care management team  . Provided patient with RN CM contact information . Reinforced previously discussed fall precautions . Discussed nutritional status and wife's concern for patients extreme nutritional restrictions  . Provided patient with educational information related to complications of malnutrition  Patient Self Care Activities:  . Contact CCM RN CM if additional needs arise . Lessen dietary restrictions to reduce weight loss  Please see past updates related to this goal by  clicking on the "Past Updates" button in the selected goal        Print copy of patient instructions provided.   Telephone follow up appointment with care management team member scheduled for: 30 days  SYMPTOMS OF A STROKE   You have any symptoms of stroke. "BE FAST" is an easy way to remember the main warning signs: ? B - Balance. Signs are dizziness, sudden trouble walking, or loss of balance. ? E - Eyes. Signs are trouble seeing or a sudden change in how you see. ? F - Face. Signs are sudden weakness or loss of feeling of the face, or the face or eyelid drooping on one side. ? A - Arms. Signs are weakness or loss of feeling in an arm. This happens suddenly and usually on one side of the body. ? S - Speech. Signs are sudden trouble speaking, slurred speech, or trouble understanding what people say. ? T - Time. Time to call emergency services. Write down what time symptoms started.  You have other signs of stroke, such as: ? A sudden, very bad headache with no known cause. ? Feeling sick to your stomach (nausea). ? Throwing up (vomiting). ? Jerky movements you cannot control (seizure).  SYMPTOMS OF A HEART ATTACK  What are the signs or symptoms? Symptoms of this condition include:  Chest pain. It may feel like: ? Crushing or squeezing. ? Tightness, pressure, fullness, or heaviness.  Pain in the arm, neck, jaw, back, or upper body.  Shortness of breath.  Heartburn.  Indigestion.  Nausea.  Cold sweats.  Feeling tired.  Sudden lightheadedness.     Healthy Eating Following a healthy eating pattern may help you to achieve and maintain a healthy body weight, reduce the risk of chronic disease, and live a long and productive life. It is important to follow a healthy eating pattern at an appropriate calorie level for your body. Your nutritional needs should be met primarily through food by choosing a variety of nutrient-rich foods. What are tips for following this  plan? Reading food labels  Read labels and choose the following: ? Reduced or low sodium. ? Juices with 100% fruit juice. ? Foods with low saturated fats and high polyunsaturated and monounsaturated fats. ? Foods with whole grains, such as whole wheat, cracked wheat, brown rice, and wild rice. ? Whole grains that are fortified with folic acid. This is recommended for women who are pregnant or who want to become pregnant.  Read labels and avoid the following: ? Foods with a lot of added sugars. These include foods that contain brown sugar, corn sweetener, corn syrup, dextrose, fructose, glucose, high-fructose corn syrup, honey, invert sugar, lactose, malt syrup, maltose, molasses, raw sugar, sucrose, trehalose, or turbinado sugar.  Do not eat more than the following amounts of added sugar per day:  6 teaspoons (25 g) for women.  9 teaspoons (38 g) for men. ? Foods that contain processed or refined starches and grains. ? Refined grain products, such as white flour, degermed cornmeal, white bread, and white rice. Shopping  Choose nutrient-rich snacks, such as vegetables, whole fruits, and nuts. Avoid high-calorie and high-sugar snacks, such as potato chips, fruit snacks, and candy.  Use oil-based dressings and spreads on foods instead of solid fats such as butter, stick margarine, or cream cheese.  Limit pre-made sauces, mixes, and "instant" products such as flavored rice, instant noodles, and ready-made pasta.  Try more plant-protein sources, such as tofu, tempeh, black beans, edamame, lentils, nuts, and seeds.  Explore eating plans such as the Mediterranean diet or vegetarian diet. Cooking  Use oil to saut or stir-fry foods instead of solid fats such as butter, stick margarine, or lard.  Try baking, boiling, grilling, or broiling instead of frying.  Remove the fatty part of meats before cooking.  Steam vegetables in water or broth. Meal planning   At meals, imagine dividing  your plate into fourths: ? One-half of your plate is fruits and vegetables. ? One-fourth of your plate is whole grains. ? One-fourth of your plate is protein, especially lean meats, poultry, eggs, tofu, beans, or nuts.  Include low-fat dairy as part of your daily diet. Lifestyle  Choose healthy options in all settings, including home, work, school, restaurants, or stores.  Prepare your food safely: ? Wash your hands after handling raw meats. ? Keep food preparation surfaces clean by regularly washing with hot, soapy water. ? Keep raw meats separate from ready-to-eat foods, such as fruits and vegetables. ? Cook seafood, meat, poultry, and eggs to the recommended internal temperature. ? Store foods at safe temperatures. In general:  Keep cold foods at 90F (4.4C) or below.  Keep hot foods at 190F (60C) or above.  Keep your freezer at [facility] (-17.8C) or below.  Foods are no longer safe to eat when they have been between the temperatures of 40-190F (4.4-60C) for more than 2 hours. What foods should I eat? Fruits Aim to eat 2 cup-equivalents of fresh, canned (in natural juice), or frozen fruits each day. Examples of  1 cup-equivalent of fruit include 1 small apple, 8 large strawberries, 1 cup canned fruit,  cup dried fruit, or 1 cup 100% juice. Vegetables Aim to eat 2-3 cup-equivalents of fresh and frozen vegetables each day, including different varieties and colors. Examples of 1 cup-equivalent of vegetables include 2 medium carrots, 2 cups raw, leafy greens, 1 cup chopped vegetable (raw or cooked), or 1 medium baked potato. Grains Aim to eat 6 ounce-equivalents of whole grains each day. Examples of 1 ounce-equivalent of grains include 1 slice of bread, 1 cup ready-to-eat cereal, 3 cups popcorn, or  cup cooked rice, pasta, or cereal. Meats and other proteins Aim to eat 5-6 ounce-equivalents of protein each day. Examples of 1 ounce-equivalent of protein include 1 egg, 1/2 cup nuts or  seeds, or 1 tablespoon (16 g) peanut butter. A cut of meat or fish that is the size of a deck of cards is about 3-4 ounce-equivalents.  Of the protein you eat each week, try to have at least 8 ounces come from seafood. This includes salmon, trout, herring, and anchovies. Dairy Aim to eat 3 cup-equivalents of fat-free or low-fat dairy each day. Examples of 1 cup-equivalent of dairy include 1 cup (240 mL) milk, 8 ounces (250 g) yogurt, 1 ounces (44 g) natural cheese, or 1 cup (240 mL) fortified soy milk. Fats and oils  Aim for about 5 teaspoons (21 g) per day. Choose monounsaturated fats, such as canola and olive oils, avocados, peanut butter, and most nuts, or polyunsaturated fats, such as sunflower, corn, and soybean oils, walnuts, pine nuts, sesame seeds, sunflower seeds, and flaxseed. Beverages  Aim for six 8-oz glasses of water per day. Limit coffee to three to five 8-oz cups per day.  Limit caffeinated beverages that have added calories, such as soda and energy drinks.  Limit alcohol intake to no more than 1 drink a day for nonpregnant women and 2 drinks a day for men. One drink equals 12 oz of beer (355 mL), 5 oz of wine (148 mL), or 1 oz of hard liquor (44 mL). Seasoning and other foods  Avoid adding excess amounts of salt to your foods. Try flavoring foods with herbs and spices instead of salt.  Avoid adding sugar to foods.  Try using oil-based dressings, sauces, and spreads instead of solid fats. This information is based on general [place] nutrition guidelines. For more information, visit [employer].es. Exact amounts may vary based on your nutrition needs. Summary  A healthy eating plan may help you to maintain a healthy weight, reduce the risk of chronic diseases, and stay active throughout your life.  Plan your meals. Make sure you eat the right portions of a variety of nutrient-rich foods.  Try baking, boiling, grilling, or broiling instead of frying.  Choose healthy  options in all settings, including home, work, school, restaurants, or stores. This information is not intended to replace advice given to you by your health care provider. Make sure you discuss any questions you have with your health care provider. Document Released: 10/06/2017 Document Revised: 10/06/2017 Document Reviewed: 10/06/2017 Elsevier Interactive Patient Education  2019 [employer].

## 2018-12-24 ENCOUNTER — Telehealth: Payer: Medicare Other

## 2018-12-28 ENCOUNTER — Ambulatory Visit: Payer: Medicare Other | Admitting: Pharmacist

## 2018-12-28 DIAGNOSIS — Z79899 Other long term (current) drug therapy: Secondary | ICD-10-CM

## 2018-12-28 DIAGNOSIS — R296 Repeated falls: Secondary | ICD-10-CM

## 2018-12-28 NOTE — Progress Notes (Signed)
This encounter was created in error - please disregard.

## 2018-12-29 NOTE — Chronic Care Management (AMB) (Signed)
  Chronic Care Management   Follow Up Note   12/29/2018 - LATE ENTRY Name: [name] MRN: [id number] DOB: 03/03/1925  Subjective:  [name] is a 85 y.o. year old male who is a primary care patient of Bacigalupo, Dionne Bucy, MD. The CCM clinical pharmacist is working with [name] with polypharmacy.    Assessment: [name] medication list fairly unchanged. Removed gabapentin as prescription is expired and patient is not interested in requesting refill from Dr. Melrose Nakayama. Patient continues to not take Myrbetriq over mysterious article/friend reporting ??? Cancer?? Or other poor outcome. Counseled on OTC use of Zinc. [name] prepares medications and prefers AM/PM sorted medication list instead of previously prepared medication guide.    Goals Addressed            This Visit's Progress   . COMPLETED: I have a lot of pills left over (pt-stated)       Current Barriers:  . Non compliance . Knowledge deficit related to medication regimen  Pharmacist Clinical Goal(s): Over the next 90 days, [name] will be adherent to all medications as evidenced by patient report  Interventions: . Patient educated on purpose and proper use of HCTZ . Removed MTX and folic acid from medication list (completed course per dermatologist); patient does not wish to request refill for gabapentin . Reviewed medication regimen  Patient Self Care Activities:  . Take all prescriptions as prescribed  . Utilize medication chart when taking medications  Please see past updates related to this goal by clicking on the "Past Updates" button in the selected goal       . Why am I on these medicines? (pt-stated)       Current Barriers:  [name] Knowledge deficit surrounding purpose and mechanism of medications  Pharmacist Clinical Goal(s): Over the next 30 days, [name] will verbalize understanding of medication plan as it relates to his plan of care.   Interventions: . Patient educated  on purpose, proper use and potential adverse effects of OTC supplements (zinc), tamsulosin and Myrbetriq (patient insists he has read news article about Myrbetric?),  . Reviewed medication list - removed gabapentin ("if Dr. Melrose Nakayama wanted me to take it he would have kept prescribing it. So it's okay")  . Reviewed medication guide previously provided to patient  Patient Self Care Activities:  . Take all medications as prescribed   Please see past updates related to this goal by clicking on the "Past Updates" button in the selected goal         Plan: Prepare patient a new medication guide with preferred format on Friday, June 26.   1 month pharmacy follow up via telephone.   [name], PharmD Clinical Pharmacist [place] [phone]

## 2018-12-30 NOTE — Progress Notes (Signed)
This encounter was created in error - please disregard.

## 2019-01-01 ENCOUNTER — Ambulatory Visit: Payer: Medicare Other | Admitting: Pharmacist

## 2019-01-01 DIAGNOSIS — Z79899 Other long term (current) drug therapy: Secondary | ICD-10-CM

## 2019-01-01 DIAGNOSIS — R296 Repeated falls: Secondary | ICD-10-CM

## 2019-01-01 NOTE — Chronic Care Management (AMB) (Signed)
  Chronic Care Management   Note  01/01/2019 Name: [name] MRN: [id number] DOB: [birth year]  Care coordination: created new medication guide per patient request.   Goals Addressed            This Visit's Progress   . Why am I on these medicines? (pt-stated)       Current Barriers:  [name] Knowledge deficit surrounding purpose and mechanism of medications  Pharmacist Clinical Goal(s): Over the next 30 days, [name] will verbalize understanding of medication plan as it relates to his plan of care.   Interventions: . Patient educated on purpose, proper use and potential adverse effects of OTC supplements (zinc), tamsulosin and Myrbetriq (patient insists he has read news article about Myrbetric?),  . Reviewed medication list - removed gabapentin ("if Dr. Melrose Patton wanted me to take it he would have kept prescribing it. So it's okay")  . Reviewed medication guide previously provided to patient o Updated 6/26: created new medication guide per patient request  Patient Self Care Activities:  . Take all medications as prescribed   Please see past updates related to this goal by clicking on the "Past Updates" button in the selected goal          Follow up plan: Telephone follow up appointment with care management team member scheduled for: 7/20 with PharmD  [name], PharmD Clinical Pharmacist [place] [phone]

## 2019-01-24 IMAGING — CT CT HEAD W/O CM
3 of 6 series · 14 of 47 positions shown, 16 images · non-contrast
Comparison: CT head 11/19/2010, MR brain 11/20/2010

CLINICAL DATA: Trauma, fell in [employer] parking lot, tripped on
curve, fell onto asphalt, loss of consciousness,

EXAM:
CT HEAD WITHOUT CONTRAST
CT MAXILLOFACIAL WITHOUT CONTRAST
TECHNIQUE: Multidetector CT imaging of the head and maxillofacial structures
were performed using the standard protocol without intravenous
contrast. Multiplanar CT image reconstructions of the maxillofacial
structures were also generated.

[Series 5: head 3.0 mpr cor · coronal · 0.33mm/px · 3 of 76 slices shown]
[im 20/76  brain]
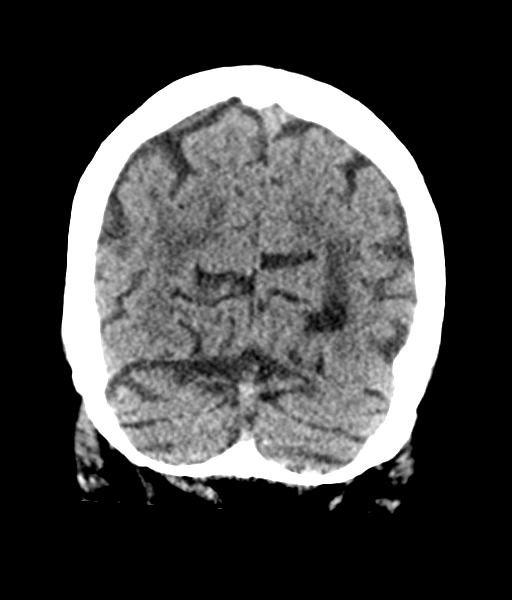
[im 38/76  brain]
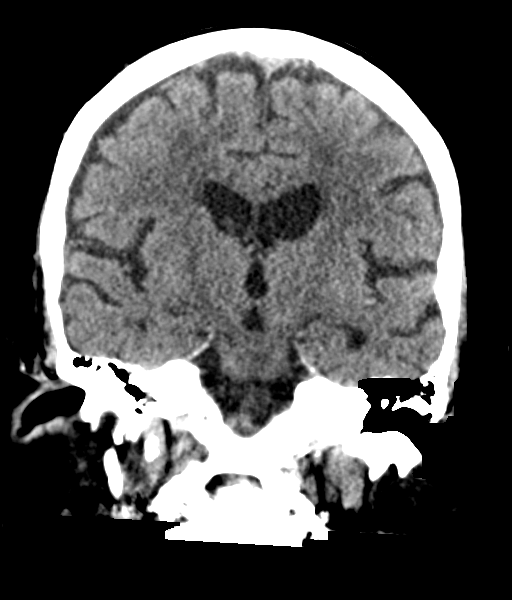
[im 56/76  brain]
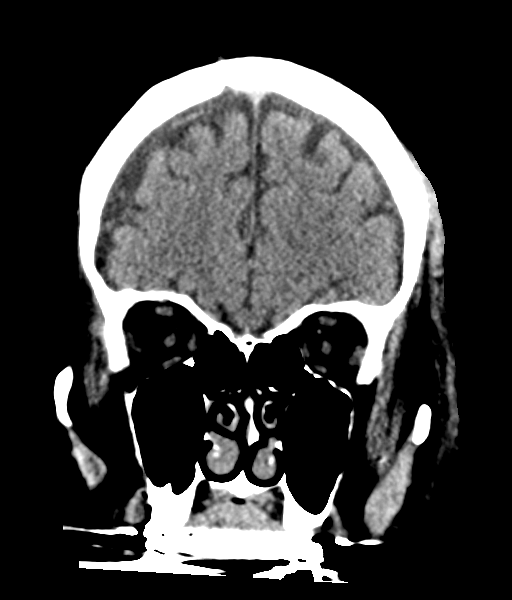

[Series 7: facial/ orbits 2.0 h30s · axial · 0.38mm/px · z∈[-200,-32]mm · 8 of 104 slices shown, 10 images]
[im 10/104  brain]
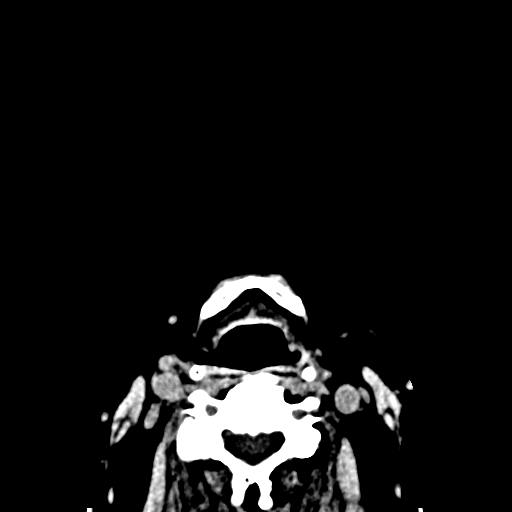
[im 10/104  bone]
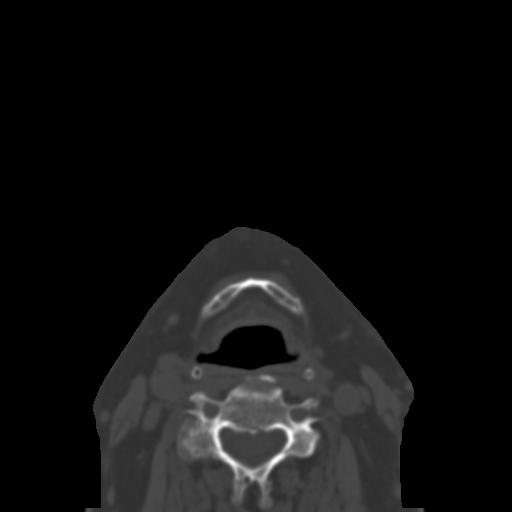
[im 20/104  brain]
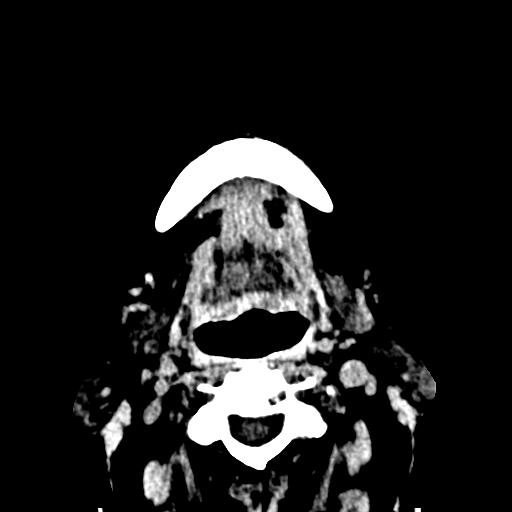
[im 35/104  brain]
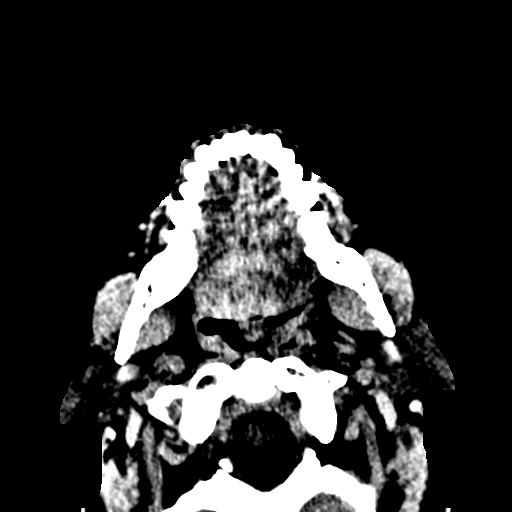
[im 45/104  brain]
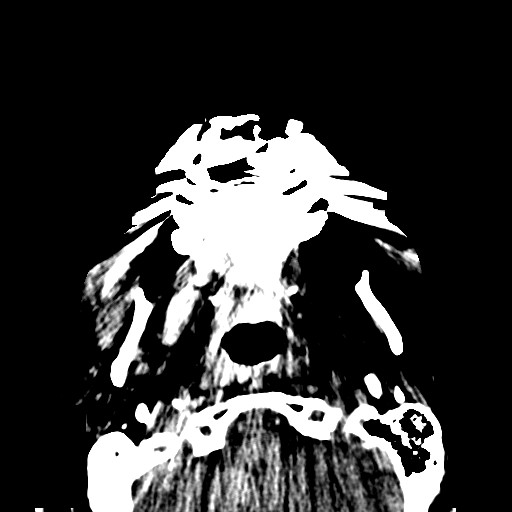
[im 59/104  brain]
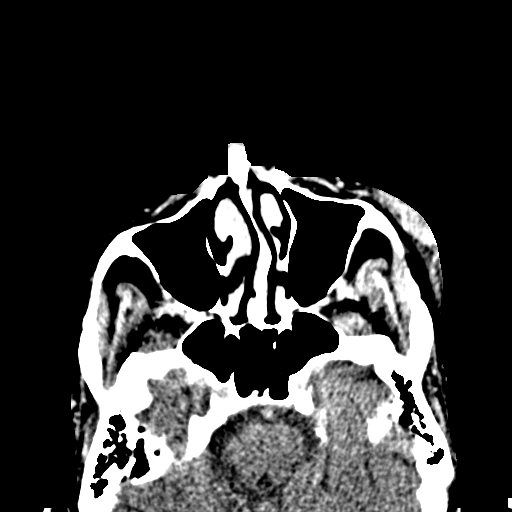
[im 59/104  bone]
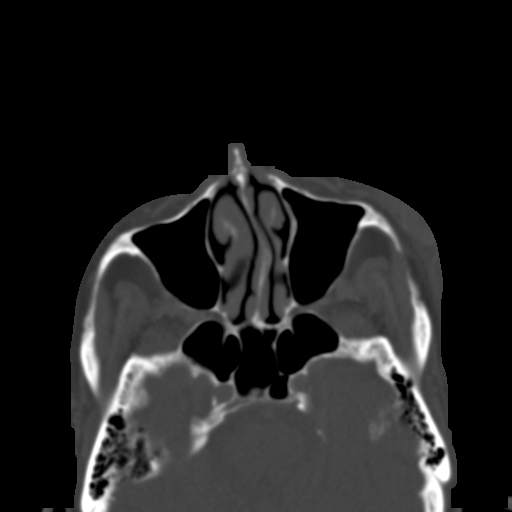
[im 69/104  brain]
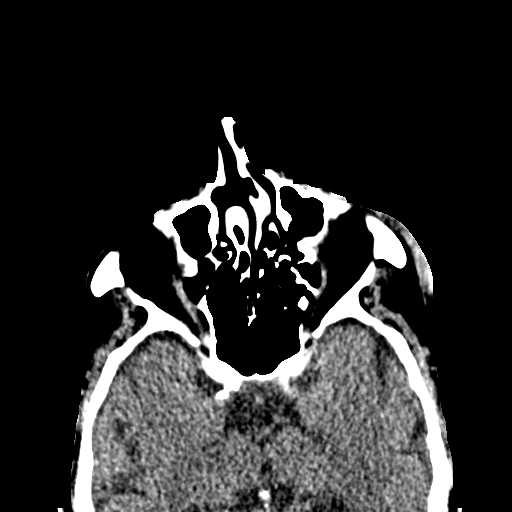
[im 84/104  brain]
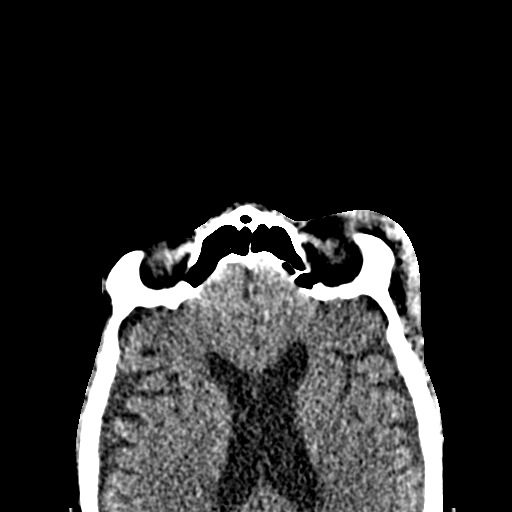
[im 94/104  brain]
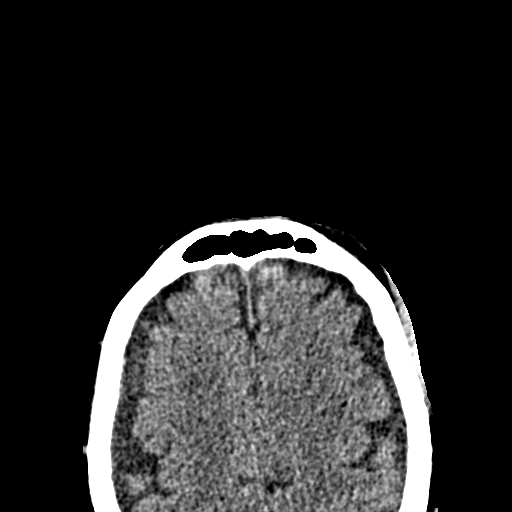

[Series 12: sagittal soft tissue · sagittal · 0.38mm/px · 3 of 103 slices shown]
[im 26/103  brain]
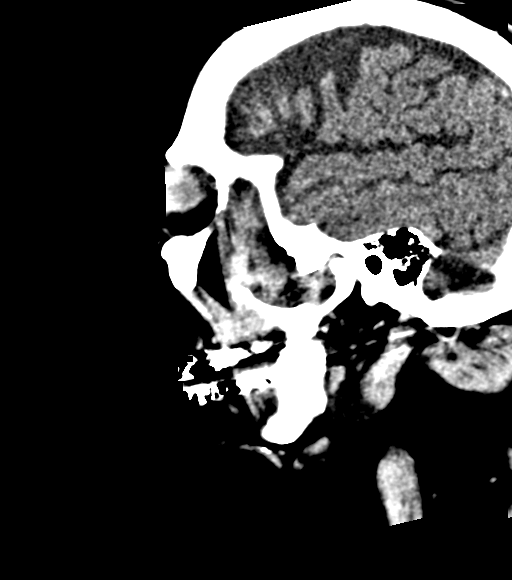
[im 52/103  brain]
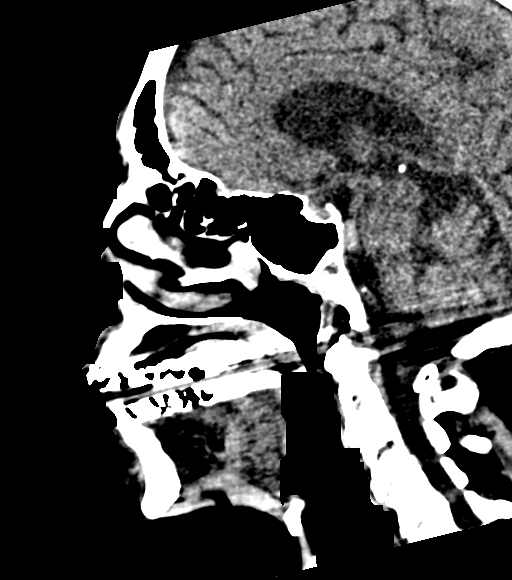
[im 77/103  brain]
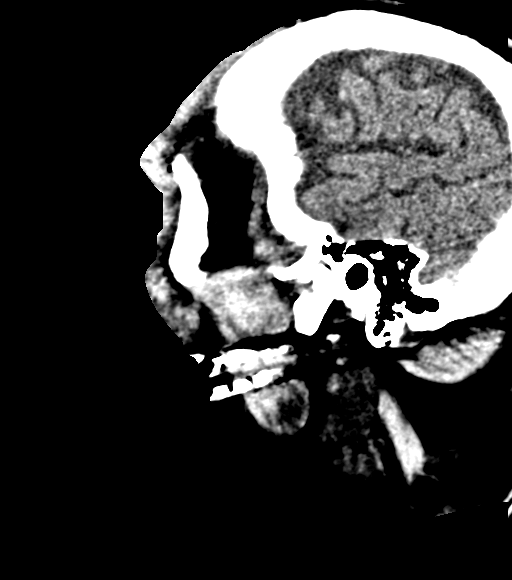

[14 of 47 positions shown; findings below may reference images not displayed]

FINDINGS: CT HEAD FINDINGS

Brain: Generalized atrophy. Normal ventricular morphology. No
midline shift or mass effect. Small vessel chronic ischemic changes
of deep cerebral white matter. Minimal prominence of the subdural
space on RIGHT versus LEFT, chronic. Old RIGHT cerebellar infarct.
Linear high attenuation at a sulcus in the LEFT occipital region
compatible with acute subarachnoid hemorrhage. Additional tiny foci
of acute subarachnoid hemorrhage at the posterior RIGHT parietal
region. No intraparenchymal hemorrhage, mass lesion or evidence of
acute infarction.

Vascular: Atherosclerotic calcification of internal carotid and
vertebral arteries at skull base

Skull: Osseous demineralization. Calvaria intact. LEFT
frontotemporal scalp hematoma with few tiny foci of soft tissue gas

Other: N/A

CT MAXILLOFACIAL FINDINGS

Osseous: Beam hardening artifacts of dental origin. TMJ alignment
normal bilaterally. Nasal septal deviation to the LEFT. Orbits and
zygomas intact. No facial bone fractures identified. Degenerative
disc and facet disease changes of the visualized cervical spine.

Orbits: Bony orbits intact. Intraorbital soft tissue planes clear
without fluid or gas

Sinuses: Paranasal sinuses, mastoid air cells, and middle ear
cavities clear bilaterally

Soft tissues: Facial hematoma identified LEFT periorbital, LEFT
supraorbital, overlying LEFT zygoma and extending into LEFT face and
LEFT temporal region. Foci of soft tissue gas lateral to the lateral
brittle wall question laceration, with no definite sinus fracture
identified.
IMPRESSION: Generalized atrophy with small vessel chronic ischemic changes of
deep cerebral white matter.

Old LEFT cerebellar infarct.

Foci of acute subarachnoid hemorrhage identified at the LEFT
occipital lobe and posterior RIGHT parietal lobes.

No acute intraparenchymal abnormalities identified.

No acute facial bone abnormalities.

LEFT facial hematoma as above.

Findings called to Dr.  Lai Ching on 04/11/2018 at 2191 hours.

## 2019-01-25 ENCOUNTER — Telehealth: Payer: Medicare Other

## 2019-01-27 ENCOUNTER — Ambulatory Visit: Payer: Medicare Other | Admitting: Pharmacist

## 2019-01-27 DIAGNOSIS — R296 Repeated falls: Secondary | ICD-10-CM

## 2019-01-27 DIAGNOSIS — Z79899 Other long term (current) drug therapy: Secondary | ICD-10-CM

## 2019-01-28 NOTE — Chronic Care Management (AMB) (Signed)
  Chronic Care Management   Follow Up Note   01/28/2019  Name: [name] MRN: [id number] DOB: Jan 04, 1925  Subjective:  [name] is a 85 y.o. year old male who is a primary care patient of Bacigalupo, Dionne Bucy, MD. The CCM clinical pharmacist is working with [name] with polypharmacy.    Assessment: [name] reports that he is utilizing his medication list and his medications remain unchanged. No OTC or supplements have been added in the prior 1 month period since we last spoke. He is continuing to exercise daily by walking, with no falls. Feels very fit.    Goals Addressed            This Visit's Progress   . COMPLETED: Why am I on these medicines? (pt-stated)       Current Barriers:  [name] Knowledge deficit surrounding purpose and mechanism of medications  Pharmacist Clinical Goal(s): Over the next 30 days, [name] will verbalize understanding of medication plan as it relates to his plan of care.   Interventions: . Patient educated on purpose, proper use and potential adverse effects of OTC supplements (zinc), tamsulosin and Myrbetriq (patient insists he has read news article about Myrbetric?),  . Reviewed medication list - removed gabapentin ("if Dr. Melrose Nakayama wanted me to take it he would have kept prescribing it. So it's okay")  . Reviewed medication guide previously provided to patient o Updated 6/26: created new medication guide per patient request  Patient Self Care Activities:  . Take all medications as prescribed   Please see past updates related to this goal by clicking on the "Past Updates" button in the selected goal         Plan: 1 month polypharmacy follow up via telephone  [name], PharmD Clinical Pharmacist [place] [phone]

## 2019-01-28 NOTE — Patient Instructions (Signed)
Goals Addressed            This Visit's Progress   . COMPLETED: Why am I on these medicines? (pt-stated)       Current Barriers:  [name] Knowledge deficit surrounding purpose and mechanism of medications  Pharmacist Clinical Goal(s): Over the next 30 days, [name] will verbalize understanding of medication plan as it relates to his plan of care.   Interventions: . Patient educated on purpose, proper use and potential adverse effects of OTC supplements (zinc), tamsulosin and Myrbetriq (patient insists he has read news article about Myrbetric?),  . Reviewed medication list - removed gabapentin ("if Dr. Melrose Nakayama wanted me to take it he would have kept prescribing it. So it's okay")  . Reviewed medication guide previously provided to patient o Updated 6/26: created new medication guide per patient request  Patient Self Care Activities:  . Take all medications as prescribed   Please see past updates related to this goal by clicking on the "Past Updates" button in the selected goal

## 2019-02-25 ENCOUNTER — Ambulatory Visit (INDEPENDENT_AMBULATORY_CARE_PROVIDER_SITE_OTHER): Payer: Medicare Other | Admitting: Pharmacist

## 2019-02-25 DIAGNOSIS — F3342 Major depressive disorder, recurrent, in full remission: Secondary | ICD-10-CM

## 2019-02-25 DIAGNOSIS — Z79899 Other long term (current) drug therapy: Secondary | ICD-10-CM

## 2019-02-26 NOTE — Chronic Care Management (AMB) (Signed)
  Chronic Care Management   Follow Up Note   01/28/2019  Name: [name] MRN: [id number] DOB: 06/16/1925  Subjective:  [name] is a 85 y.o. year old male who is a primary care patient of Bacigalupo, Jesus Bucy, MD. The CCM clinical pharmacist is working with [name] with polypharmacy.    Assessment: [name] reports that he is utilizing his medication list and his medications remain unchanged. No OTC or supplements have been added in the prior 1 month period since we last spoke. Experiencing some leg cramping at night. He is continuing to exercise daily by walking, with no falls.     Addressed questions around escitalopram: Patient states he no longer feels he  Needs it but is continuing to take it daily. CCM pharmacist will consult with Dr. Brita Patton but urged patient NOT to abruptly discontinue medication. Suggested every other day dosing instead.   Goals Addressed            This Visit's Progress   . COMPLETED: Why am I on these medicines? (pt-stated)       Current Barriers:  [name] Knowledge deficit surrounding purpose and mechanism of medications  Pharmacist Clinical Goal(s): Over the next 30 days, [name] will verbalize understanding of medication plan as it relates to his plan of care.   Interventions: . Patient educated on purpose, proper use and potential adverse effects of OTC supplements (zinc), tamsulosin and Myrbetriq (patient insists he has read news article about Myrbetric?),  . Reviewed medication list - removed gabapentin ("if [name] wanted me to take it he would have kept prescribing it. So it's okay")  . Reviewed medication guide previously provided to patient o Updated 6/26: created new medication guide per patient request  Patient Self Care Activities:  . Take all medications as prescribed   Please see past updates related to this goal by clicking on the "Past Updates" button in the selected goal         Plan: 1 month follow  up via telephone for escitalopram taper and polypharmacy  [name], PharmD Clinical Pharmacist [place] [phone]

## 2019-02-26 NOTE — Patient Instructions (Signed)
Goals Addressed            This Visit's Progress   . I don't think I have depression anymore (pt-stated)       Current Barriers:  . Patient's desire to take as few medicines as possible . polypharmacy  Pharmacist Clinical Goal(s):  [name] Over the next 60 days, patient will work with pharmacist and Dr. Brita Romp to address needs related to tapering off of escitalopram under the direction of Dr. Jacinto Reap  and pharmacist  Interventions: . Advised patient to take escitalopram every other day for the next 1 month  Patient Self Care Activities:  . Self administers medications as prescribed . Attends all scheduled provider appointments . Calls provider office for new concerns or questions  Initial goal documentation

## 2019-04-05 ENCOUNTER — Ambulatory Visit (INDEPENDENT_AMBULATORY_CARE_PROVIDER_SITE_OTHER): Payer: Medicare Other | Admitting: Pharmacist

## 2019-04-05 DIAGNOSIS — Z79899 Other long term (current) drug therapy: Secondary | ICD-10-CM

## 2019-04-05 DIAGNOSIS — F3342 Major depressive disorder, recurrent, in full remission: Secondary | ICD-10-CM | POA: Diagnosis not present

## 2019-04-07 NOTE — Chronic Care Management (AMB) (Signed)
  Chronic Care Management   Follow Up Note   04/07/19- late entry Name: [name] MRN: [id number] DOB: [birth year]  Subjective:  [name] is a 85 y.o. year old male who is a primary care patient of Bacigalupo, Dionne Bucy, MD. The CCM clinical pharmacist is working with [name] with polypharmacy.    Assessment: Reduced escitalopram dose per Dr. Nancy Nordmann taper to 5mg  QOD; patient has pill cutter at home and 10mg  tablets are scored. Provided extensive counseling. Performed PHQ9 assessment for signs and symptoms of depression; patient scored a 2- some loss of interest in activities he used to enjoy, but he attributes this to the pandemic. Is continuing to walk daily which he does enjoy very much.   Goals Addressed            This Visit's Progress   . I don't think I have depression anymore (pt-stated)       Current Barriers:  . Patient's desire to take as few medicines as possible . polypharmacy  Pharmacist Clinical Goal(s):  [name] Over the next 60 days, patient will work with pharmacist and Dr. Brita Romp to address needs related to tapering off of escitalopram under the direction of Dr. Jacinto Reap  and pharmacist  Interventions: . Advised patient to take escitalopram every other day for the next 1 month . Updated 9/28: patient to reduce dose to 5mg  (1/2 tablet) QOD for 4 weeks; will use pill splitter that he has at home; [facility] performed  Patient Self Care Activities:  . Self administers medications as prescribed . Attends all scheduled provider appointments . Calls provider office for new concerns or questions  Please see past updates related to this goal by clicking on the "Past Updates" button in the selected goal           Plan: 1 month follow up via telephone for escitalopram taper and polypharmacy  [name], PharmD Clinical Pharmacist [place] [phone]

## 2019-04-07 NOTE — Patient Instructions (Signed)
Goals Addressed            This Visit's Progress   . I don't think I have depression anymore (pt-stated)       Current Barriers:  . Patient's desire to take as few medicines as possible . polypharmacy  Pharmacist Clinical Goal(s):  [name] Over the next 60 days, patient will work with pharmacist and Dr. Brita Romp to address needs related to tapering off of escitalopram under the direction of Dr. Jacinto Reap  and pharmacist  Interventions: . Advised patient to take escitalopram every other day for the next 1 month . Updated 9/28: patient to reduce dose to 5mg  (1/2 tablet) QOD for 4 weeks; will use pill splitter that he has at home; [facility] performed  Patient Self Care Activities:  . Self administers medications as prescribed . Attends all scheduled provider appointments . Calls provider office for new concerns or questions  Please see past updates related to this goal by clicking on the "Past Updates" button in the selected goal

## 2019-04-29 ENCOUNTER — Encounter: Payer: Medicare Other | Admitting: Pharmacist

## 2019-05-03 ENCOUNTER — Telehealth: Payer: Self-pay

## 2019-05-03 NOTE — Telephone Encounter (Signed)
Called pt to schedule a telephonic AWV. There was NANM. Will try again later.

## 2019-05-17 ENCOUNTER — Ambulatory Visit: Payer: Self-pay | Admitting: Pharmacist

## 2019-05-17 DIAGNOSIS — F3342 Major depressive disorder, recurrent, in full remission: Secondary | ICD-10-CM

## 2019-05-17 DIAGNOSIS — Z79899 Other long term (current) drug therapy: Secondary | ICD-10-CM

## 2019-05-19 NOTE — Chronic Care Management (AMB) (Signed)
Chronic Care Management   Follow Up Note   05/19/2019 Name: ASHA SEARS MRN: [id number] DOB: 1925/02/11  Subjective [name] is a 85 y.o. year old male who is a primary care patient of Bacigalupo, Dionne Bucy, MD. The CCM clinical pharmacist is engaged with patient for medication management. Follow up telephone outreach today to monitor patient's escitalopram taper. HIPAA identifiers verified.  Review of patient status, including review of consultants reports, relevant laboratory and other test results, and collaboration with appropriate care team members and the patient's provider was performed as part of comprehensive patient evaluation and provision of chronic care management services.    Objective    Chronic Care Management from 05/17/2019 in [facility] Total Score  0        Outpatient Encounter Medications as of 05/17/2019  Medication Sig Note  . amLODipine (NORVASC) 5 MG tablet Take 1 tablet (5 mg total) by mouth daily.   [name] aspirin EC 81 MG tablet Take 81 mg by mouth daily.   . Emollient (CERAVE) CREA Apply 1 application topically 2 (two) times daily as needed (itching).    . fluticasone (FLONASE) 50 MCG/ACT nasal spray Place 1 spray into both nostrils 2 (two) times daily.   . hydrochlorothiazide (MICROZIDE) 12.5 MG capsule Take 1 capsule (12.5 mg total) by mouth daily.   . Multiple Vitamin (MULTI-VITAMIN) tablet Take by mouth.   . naproxen sodium (ALEVE) 220 MG tablet Take 220 mg by mouth daily as needed (pain/headache).   Vladimir Faster Glycol-Propyl Glycol (SYSTANE OP) Place 1 drop into both eyes 2 (two) times daily.   . polyethylene glycol powder (GLYCOLAX/MIRALAX) powder Take 17 g by mouth daily. (Patient not taking: Reported on 04/05/2019)   . tamsulosin (FLOMAX) 0.4 MG CAPS capsule TAKE ONE CAPSULE BY MOUTH ONCE DAILY (Patient not taking: Reported on 04/05/2019) 04/11/2018: #90 filled 12/18/17 [facility] - pt thinks he is still taking "the  medication from the urologist"  . triamcinolone ointment (KENALOG) 0.5 % Apply 1 application topically 2 (two) times daily. (Patient not taking: Reported on 04/05/2019)   . Zinc 50 MG TABS Take 50 mg by mouth daily.  01/01/2019: "for body odor" recommend patient DC  . [DISCONTINUED] escitalopram (LEXAPRO) 10 MG tablet Take 1 tablet (10 mg total) by mouth at bedtime. 09/02/2018: "I don't think I need it now"   No facility-administered encounter medications on file as of 05/17/2019.      Goals Addressed            This Visit's Progress   . COMPLETED: I don't think I have depression anymore (pt-stated)       Current Barriers:  . Patient's desire to take as few medicines as possible . polypharmacy  Pharmacist Clinical Goal(s):  [name] Over the next 60 days, patient will work with pharmacist and Dr. Brita Romp to address needs related to tapering off of escitalopram under the direction of Dr. Jacinto Reap  and pharmacist  Interventions: . Advised patient to take escitalopram every other day for the next 1 month . Updated 9/28: patient to reduce dose to 5mg  (1/2 tablet) QOD for 4 weeks; will use pill splitter that he has at home; PHQ9 performed . Updated 11/9: completely off escitalopram for 2 weeks. PHQ2 of 0  Patient Self Care Activities:  . Self administers medications as prescribed . Attends all scheduled provider appointments . Calls provider office for new concerns or questions  Please see past updates related to this goal by clicking on  the "Past Updates" button in the selected goal         Plan: Recommendations for provider: Patient reports feeling well and happy to not be taking escitalopram anymore   Recommendations for patient: Please call Dr. B if you start losing interest in things you like to do (reading, walking) or begin to feel anxious, down, depressed;   Follow up Face to Face appointment with care management team member scheduled for:  1 month  [name], PharmD Clinical  Pharmacist [place] [phone]

## 2019-05-19 NOTE — Patient Instructions (Signed)
Goals Addressed            This Visit's Progress   . COMPLETED: I don't think I have depression anymore (pt-stated)       Current Barriers:  . Patient's desire to take as few medicines as possible . polypharmacy  Pharmacist Clinical Goal(s):  [name] Over the next 60 days, patient will work with pharmacist and Dr. Brita Romp to address needs related to tapering off of escitalopram under the direction of Dr. Jacinto Reap  and pharmacist  Interventions: . Advised patient to take escitalopram every other day for the next 1 month . Updated 9/28: patient to reduce dose to 5mg  (1/2 tablet) QOD for 4 weeks; will use pill splitter that he has at home; PHQ9 performed . Updated 11/9: completely off escitalopram for 2 weeks. PHQ2 of 0  Patient Self Care Activities:  . Self administers medications as prescribed . Attends all scheduled provider appointments . Calls provider office for new concerns or questions  Please see past updates related to this goal by clicking on the "Past Updates" button in the selected goal

## 2019-06-01 NOTE — Telephone Encounter (Signed)
Telephonic AWV scheduled for 06/10/19 @ 9:00 AM.

## 2019-06-09 NOTE — Progress Notes (Signed)
Subjective:   [name] is a 85 y.o. male who presents for Medicare Annual/Subsequent preventive examination.    This visit is being conducted through telemedicine due to the COVID-19 pandemic. This patient has given me verbal consent via doximity to conduct this visit, patient states they are participating from their home address. Some vital signs may be absent or patient reported.    Patient identification: identified by name, DOB, and current address  Review of Systems:  N/A  Cardiac Risk Factors include: advanced age (>26men, >6 women);male gender;hypertension     Objective:    Vitals: BP 126/77 (BP Location: Left Arm)   Pulse 77   There is no height or weight on file to calculate BMI. Unable to obtain vitals due to visit being conducted via telephonically.    Advanced Directives 06/10/2019 11/17/2017 09/13/2016 12/15/2015 01/05/2015  Does Patient Have a Medical Advance Directive? No No No No Yes  Type of Advance Directive - - - - Living will  Does patient want to make changes to medical advance directive? - - - - No - Patient declined  Would patient like information on creating a medical advance directive? No - Patient declined Yes (MAU/Ambulatory/Procedural Areas - Information given) Yes (ED - Information included in AVS) - -    Tobacco Social History   Tobacco Use  Smoking Status Former Smoker  . Years: 10.00  . Quit date: 07/08/1962  . Years since quitting: 56.9  Smokeless Tobacco Never Used  Tobacco Comment   quit 1964     Counseling given: Not Answered Comment: quit 1964   Clinical Intake:  Pre-visit preparation completed: Yes  Pain : No/denies pain Pain Score: 0-No pain     Nutritional Risks: None Diabetes: No(Prediabetic)  How often do you need to have someone help you when you read instructions, pamphlets, or other written materials from your doctor or pharmacy?: 1 - Never  Interpreter Needed?: No  Information entered by :: [facility], LPN   Past Medical History:  Diagnosis Date  . Arthritis   . Bladder outlet obstruction   . Erectile dysfunction   . GERD (gastroesophageal reflux disease)   . Hard of hearing   . HBP (high blood pressure)   . History of nephrolithiasis   . HLD (hyperlipidemia)   . Incomplete bladder emptying   . Nocturia   . Osteoporosis   . Prostate cancer ([place])   . Shingles 2014  . Sinus problem   . Stomach ulcer   . Stroke ([place]) 2012  . Urinary frequency   . Urinary leakage    Past Surgical History:  Procedure Laterality Date  . CATARACT EXTRACTION  2000, 2001  . CHOLECYSTECTOMY  1972  . PROSTATE SURGERY  2008/2009  . TONSILLECTOMY    . UMBILICAL HERNIA REPAIR     Family History  Problem Relation Age of Onset  . Cancer Mother   . Stroke Father   . Stroke Sister   . Prostate cancer Brother   . Kidney disease Neg Hx   . Kidney cancer Neg Hx   . Bladder Cancer Neg Hx    Social History   Socioeconomic History  . Marital status: Married    Spouse name: Not on file  . Number of children: 2  . Years of education: Not on file  . Highest education level: Master's degree (e.g., MA, MS, MEng, MEd, MSW, MBA)  Occupational History  . Occupation: retired  Scientific laboratory technician  . Financial resource strain: Not hard at  all  . Food insecurity    Worry: Never true    Inability: Never true  . Transportation needs    Medical: No    Non-medical: No  Tobacco Use  . Smoking status: Former Smoker    Years: 10.00    Quit date: 07/08/1962    Years since quitting: 56.9  . Smokeless tobacco: Never Used  . Tobacco comment: quit 1964  Substance and Sexual Activity  . Alcohol use: No  . Drug use: No  . Sexual activity: Not on file  Lifestyle  . Physical activity    Days per week: 0 days    Minutes per session: 0 min  . Stress: Rather much  Relationships  . Social Herbalist on phone: Patient refused    Gets together: Patient refused    Attends religious service: Patient refused    Active  member of club or organization: Patient refused    Attends meetings of clubs or organizations: Patient refused    Relationship status: Patient refused  Other Topics Concern  . Not on file  Social History Narrative  . Not on file    Outpatient Encounter Medications as of 06/10/2019  Medication Sig  . amLODipine (NORVASC) 5 MG tablet Take 1 tablet (5 mg total) by mouth daily.  [name] aspirin EC 81 MG tablet Take 81 mg by mouth daily.  . fluticasone (FLONASE) 50 MCG/ACT nasal spray Place 1 spray into both nostrils 2 (two) times daily.  . hydrochlorothiazide (MICROZIDE) 12.5 MG capsule Take 1 capsule (12.5 mg total) by mouth daily.  . Multiple Vitamin (MULTI-VITAMIN) tablet Take 1 tablet by mouth daily.   . naproxen sodium (ALEVE) 220 MG tablet Take 220 mg by mouth daily as needed (pain/headache).  Vladimir Faster Glycol-Propyl Glycol (SYSTANE OP) Place 1 drop into both eyes daily.   . Zinc 50 MG TABS Take 50 mg by mouth daily.   . Emollient (CERAVE) CREA Apply 1 application topically 2 (two) times daily as needed (itching).   . polyethylene glycol powder (GLYCOLAX/MIRALAX) powder Take 17 g by mouth daily. (Patient not taking: Reported on 04/05/2019)  . tamsulosin (FLOMAX) 0.4 MG CAPS capsule TAKE ONE CAPSULE BY MOUTH ONCE DAILY (Patient not taking: Reported on 04/05/2019)  . triamcinolone ointment (KENALOG) 0.5 % Apply 1 application topically 2 (two) times daily. (Patient not taking: Reported on 04/05/2019)   No facility-administered encounter medications on file as of 06/10/2019.     Activities of Daily Living In your present state of health, do you have any difficulty performing the following activities: 06/10/2019  Hearing? Y  Comment Wears bilateral hearing aids.  Vision? Y  Comment Due to astigmatism.  Difficulty concentrating or making decisions? Y  Walking or climbing stairs? N  Dressing or bathing? N  Doing errands, shopping? Y  Comment Does not drive.  Preparing Food and eating ? N   Using the Toilet? N  In the past six months, have you accidently leaked urine? N  Do you have problems with loss of bowel control? N  Managing your Medications? N  Managing your Finances? N  Housekeeping or managing your Housekeeping? Y  Comment Tires easily, has a Chartered certified accountant to help.  Some recent data might be hidden    Patient Care Team: Brita Romp Dionne Bucy, MD as PCP - General (Family Medicine) Edrick Kins, DPM as Consulting Physician (Podiatry) Laneta Simmers as Physician Assistant (Urology) Birder Robson, MD as Referring Physician (Ophthalmology) Arelia Sneddon, OD as  Consulting Physician (Optometry) Kellie Moor, Dayle Points, MD as Consulting Physician (Dermatology) Cathi Roan, [facility] (Pharmacist)   Assessment:   This is a routine wellness examination for [employer].  Exercise Activities and Dietary recommendations Current Exercise Habits: Home exercise routine, Type of exercise: walking, Time (Minutes): 20, Frequency (Times/Week): 5, Weekly Exercise (Minutes/Week): 100, Intensity: Mild, Exercise limited by: None identified  Goals      Patient Stated   . I think I am doing ok but can you call me back just to check on me (pt-stated)     Current Barriers:  . Chronic Disease Management support and education needs related to frequent falls  Nurse Case Manager Clinical Goal(s):  [name] Over the next 30 days, the patient will demonstrate ongoing self health care management ability as evidenced by adhering to all fall precautions previously discussed and not sustaining additional falls . Over the next 30 days, patient will report adhering to a less restrictive diet to promote weight maintenance instead of weight loss  Interventions:  . Discussed plans with patient for ongoing care management follow up and provided patient with direct contact information for care management team  . Provided patient with RN CM contact information . Reinforced previously discussed fall  precautions . Discussed nutritional status and wife's concern for patients extreme nutritional restrictions  . Provided patient with educational information related to complications of malnutrition  Patient Self Care Activities:  . Contact CCM RN CM if additional needs arise . Lessen dietary restrictions to reduce weight loss  Please see past updates related to this goal by clicking on the "Past Updates" button in the selected goal       Other   . Exercise 150 minutes per week (moderate activity)     Recommend more exercise. Pt to start walking daily in Spring 2018 for at least a mile.    . Prevent falls     Recommend to remove any items from the home that may cause slips or trips.       Fall Risk: Fall Risk  06/10/2019 10/05/2018 11/17/2017 09/13/2016 09/14/2015  Falls in the past year? 1 1 Yes Yes No  Number falls in past yr: 0 1 2 or more 1 -  Comment - - - slipped, reviewed by Dr Venia Minks following incident  -  Injury with Fall? 0 1 Yes Yes -  Comment - - fractured 3 bones broken rips and hurt back -  Risk for fall due to : - History of fall(s);Impaired balance/gait;Impaired mobility - - -  Follow up Falls prevention discussed Falls evaluation completed;Falls prevention discussed;Education provided Falls prevention discussed Falls prevention discussed -  Comment - - - - -    FALL RISK PREVENTION PERTAINING TO THE HOME:  Any stairs in or around the home? Yes  If so, are there any without handrails? No   Home free of loose throw rugs in walkways, pet beds, electrical cords, etc? Yes  Adequate lighting in your home to reduce risk of falls? Yes   ASSISTIVE DEVICES UTILIZED TO PREVENT FALLS:  Life alert? Yes  Use of a cane, walker or w/c? Yes  Grab bars in the bathroom? Yes  Shower chair or bench in shower? Yes  Elevated toilet seat or a handicapped toilet? No   TIMED UP AND GO:  Was the test performed? No .    Depression Screen PHQ 2/9 Scores 06/10/2019 05/17/2019  11/17/2017 09/15/2017  PHQ - 2 Score 3 0 1 4  PHQ- 9 Score 6 - -  10    Cognitive Function     6CIT Screen 06/10/2019 09/13/2016  What Year? 0 points 0 points  What month? 0 points 0 points  What time? 0 points 0 points  Count back from 20 0 points 0 points  Months in reverse 0 points 0 points  Repeat phrase 6 points 0 points  Total Score 6 0    Immunization History  Administered Date(s) Administered  . Influenza Split 05/15/2009, 05/08/2011  . Influenza, High Dose Seasonal PF 04/13/2014, 04/24/2018  . Influenza,inj,Quad PF,6+ Mos 04/07/2013  . Pneumococcal Conjugate-13 04/13/2014  . Pneumococcal Polysaccharide-23 06/09/2012  . Td 01/19/2007, 04/11/2018  . Tdap 12/04/2010, 08/19/2016  . Zoster 04/20/2007    Qualifies for Shingles Vaccine? Yes  Zostavax completed 04/20/07. Due for Shingrix. Pt has been advised to call insurance company to determine out of pocket expense. Advised may also receive vaccine at local pharmacy or Health Dept. Verbalized acceptance and understanding.  Tdap: Up to date  Flu Vaccine: Up to date  Pneumococcal Vaccine: Completed series Screening Tests Health Maintenance  Topic Date Due  . TETANUS/TDAP  04/11/2028  . INFLUENZA VACCINE  Completed  . PNA vac Low Risk Adult  Completed   Cancer Screenings:  Colorectal Screening: No longer required.   Lung Cancer Screening: (Low Dose CT Chest recommended if Age 62-80 years, 30 pack-year currently smoking OR have quit w/in 15years.) does not qualify.   Additional Screening:  Vision Screening: Recommended annual ophthalmology exams for early detection of glaucoma and other disorders of the eye.  Dental Screening: Recommended annual dental exams for proper oral hygiene  Community Resource Referral:  CRR required this visit?  No        Plan:  I have personally reviewed and addressed the Medicare Annual Wellness questionnaire and have noted the following in the patient's chart:  A. Medical and  social history B. Use of alcohol, tobacco or illicit drugs  C. Current medications and supplements D. Functional ability and status E.  Nutritional status F.  Physical activity G. Advance directives H. List of other physicians I.  Hospitalizations, surgeries, and ER visits in previous 12 months J.  [place] such as hearing and vision if needed, cognitive and depression L. Referrals and appointments   In addition, I have reviewed and discussed with patient certain preventive protocols, quality metrics, and best practice recommendations. A written personalized care plan for preventive services as well as general preventive health recommendations were provided to patient.   Glendora Score, LPN  [id number] Nurse Health Advisor   Nurse Notes: Follow up scheduled for memory and anxiety issues per request of the pt.

## 2019-06-10 ENCOUNTER — Ambulatory Visit (INDEPENDENT_AMBULATORY_CARE_PROVIDER_SITE_OTHER): Payer: Medicare Other

## 2019-06-10 ENCOUNTER — Other Ambulatory Visit: Payer: Self-pay

## 2019-06-10 VITALS — BP 126/77 | HR 77

## 2019-06-10 DIAGNOSIS — Z0000 Encounter for general adult medical examination without abnormal findings: Secondary | ICD-10-CM

## 2019-06-10 NOTE — Patient Instructions (Signed)
[name] , Thank you for taking time to come for your Medicare Wellness Visit. I appreciate your ongoing commitment to your health goals. Please review the following plan we discussed and let me know if I can assist you in the future.   Screening recommendations/referrals: Colonoscopy: No longer required.  Recommended yearly ophthalmology/optometry visit for glaucoma screening and checkup Recommended yearly dental visit for hygiene and checkup  Vaccinations: Influenza vaccine: Up to date Pneumococcal vaccine: Completed series Tdap vaccine: Up to date, due 04/2028 Shingles vaccine: Pt declines today.     Advanced directives: Advance directive discussed with you today. Even though you declined this today please call our office should you change your mind and we can give you the proper paperwork for you to fill out.  Conditions/risks identified: Fall risk prevention discussed today.   Next appointment: 06/17/19 @ 8:20 AM and 9:00 AM for a telephonic visit with [name] and CCM.   Preventive Care 81 Years and Older, Male Preventive care refers to lifestyle choices and visits with your health care provider that can promote health and wellness. What does preventive care include?  A yearly physical exam. This is also called an annual well check.  Dental exams once or twice a year.  Routine eye exams. Ask your health care provider how often you should have your eyes checked.  Personal lifestyle choices, including:  Daily care of your teeth and gums.  Regular physical activity.  Eating a healthy diet.  Avoiding tobacco and drug use.  Limiting alcohol use.  Practicing safe sex.  Taking low doses of aspirin every day.  Taking vitamin and mineral supplements as recommended by your health care provider. What happens during an annual well check? The services and screenings done by your health care provider during your annual well check will depend on your age, overall  health, lifestyle risk factors, and family history of disease. Counseling  Your health care provider may ask you questions about your:  Alcohol use.  Tobacco use.  Drug use.  Emotional well-being.  Home and relationship well-being.  Sexual activity.  Eating habits.  History of falls.  Memory and ability to understand (cognition).  Work and work Statistician. Screening  You may have the following tests or measurements:  Height, weight, and BMI.  Blood pressure.  Lipid and cholesterol levels. These may be checked every 5 years, or more frequently if you are over 36 years old.  Skin check.  Lung cancer screening. You may have this screening every year starting at age 92 if you have a 30-pack-year history of smoking and currently smoke or have quit within the past 15 years.  Fecal occult blood test (FOBT) of the stool. You may have this test every year starting at age 41.  Flexible sigmoidoscopy or colonoscopy. You may have a sigmoidoscopy every 5 years or a colonoscopy every 10 years starting at age 80.  Prostate cancer screening. Recommendations will vary depending on your family history and other risks.  Hepatitis C blood test.  Hepatitis B blood test.  Sexually transmitted disease (STD) testing.  Diabetes screening. This is done by checking your blood sugar (glucose) after you have not eaten for a while (fasting). You may have this done every 1-3 years.  Abdominal aortic aneurysm (AAA) screening. You may need this if you are a current or former smoker.  Osteoporosis. You may be screened starting at age 73 if you are at high risk. Talk with your health care provider about your test  results, treatment options, and if necessary, the need for more tests. Vaccines  Your health care provider may recommend certain vaccines, such as:  Influenza vaccine. This is recommended every year.  Tetanus, diphtheria, and acellular pertussis (Tdap, Td) vaccine. You may need a Td  booster every 10 years.  Zoster vaccine. You may need this after age 72.  Pneumococcal 13-valent conjugate (PCV13) vaccine. One dose is recommended after age 61.  Pneumococcal polysaccharide (PPSV23) vaccine. One dose is recommended after age 22. Talk to your health care provider about which screenings and vaccines you need and how often you need them. This information is not intended to replace advice given to you by your health care provider. Make sure you discuss any questions you have with your health care provider. Document Released: 07/21/2015 Document Revised: 03/13/2016 Document Reviewed: 04/25/2015 Elsevier Interactive Patient Education  2017 [place] Prevention in the Home Falls can cause injuries. They can happen to people of all ages. There are many things you can do to make your home safe and to help prevent falls. What can I do on the outside of my home?  Regularly fix the edges of walkways and driveways and fix any cracks.  Remove anything that might make you trip as you walk through a door, such as a raised step or threshold.  Trim any bushes or trees on the path to your home.  Use bright outdoor lighting.  Clear any walking paths of anything that might make someone trip, such as rocks or tools.  Regularly check to see if handrails are loose or broken. Make sure that both sides of any steps have handrails.  Any raised decks and porches should have guardrails on the edges.  Have any leaves, snow, or ice cleared regularly.  Use sand or salt on walking paths during winter.  Clean up any spills in your garage right away. This includes oil or grease spills. What can I do in the bathroom?  Use night lights.  Install grab bars by the toilet and in the tub and shower. Do not use towel bars as grab bars.  Use non-skid mats or decals in the tub or shower.  If you need to sit down in the shower, use a plastic, non-slip stool.  Keep the floor dry. Clean up  any water that spills on the floor as soon as it happens.  Remove soap buildup in the tub or shower regularly.  Attach bath mats securely with double-sided non-slip rug tape.  Do not have throw rugs and other things on the floor that can make you trip. What can I do in the bedroom?  Use night lights.  Make sure that you have a light by your bed that is easy to reach.  Do not use any sheets or blankets that are too big for your bed. They should not hang down onto the floor.  Have a firm chair that has side arms. You can use this for support while you get dressed.  Do not have throw rugs and other things on the floor that can make you trip. What can I do in the kitchen?  Clean up any spills right away.  Avoid walking on wet floors.  Keep items that you use a lot in easy-to-reach places.  If you need to reach something above you, use a strong step stool that has a grab bar.  Keep electrical cords out of the way.  Do not use floor polish or wax that makes  floors slippery. If you must use wax, use non-skid floor wax.  Do not have throw rugs and other things on the floor that can make you trip. What can I do with my stairs?  Do not leave any items on the stairs.  Make sure that there are handrails on both sides of the stairs and use them. Fix handrails that are broken or loose. Make sure that handrails are as long as the stairways.  Check any carpeting to make sure that it is firmly attached to the stairs. Fix any carpet that is loose or worn.  Avoid having throw rugs at the top or bottom of the stairs. If you do have throw rugs, attach them to the floor with carpet tape.  Make sure that you have a light switch at the top of the stairs and the bottom of the stairs. If you do not have them, ask someone to add them for you. What else can I do to help prevent falls?  Wear shoes that:  Do not have high heels.  Have rubber bottoms.  Are comfortable and fit you well.  Are  closed at the toe. Do not wear sandals.  If you use a stepladder:  Make sure that it is fully opened. Do not climb a closed stepladder.  Make sure that both sides of the stepladder are locked into place.  Ask someone to hold it for you, if possible.  Clearly mark and make sure that you can see:  Any grab bars or handrails.  First and last steps.  Where the edge of each step is.  Use tools that help you move around (mobility aids) if they are needed. These include:  Canes.  Walkers.  Scooters.  Crutches.  Turn on the lights when you go into a dark area. Replace any light bulbs as soon as they burn out.  Set up your furniture so you have a clear path. Avoid moving your furniture around.  If any of your floors are uneven, fix them.  If there are any pets around you, be aware of where they are.  Review your medicines with your doctor. Some medicines can make you feel dizzy. This can increase your chance of falling. Ask your doctor what other things that you can do to help prevent falls. This information is not intended to replace advice given to you by your health care provider. Make sure you discuss any questions you have with your health care provider. Document Released: 04/20/2009 Document Revised: 11/30/2015 Document Reviewed: 07/29/2014 Elsevier Interactive Patient Education  2017 [employer].

## 2019-06-17 ENCOUNTER — Other Ambulatory Visit: Payer: Self-pay

## 2019-06-17 ENCOUNTER — Telehealth: Payer: Medicare Other

## 2019-06-17 ENCOUNTER — Encounter: Payer: Self-pay | Admitting: Family Medicine

## 2019-06-17 ENCOUNTER — Ambulatory Visit (INDEPENDENT_AMBULATORY_CARE_PROVIDER_SITE_OTHER): Payer: Medicare Other | Admitting: Family Medicine

## 2019-06-17 DIAGNOSIS — R413 Other amnesia: Secondary | ICD-10-CM | POA: Diagnosis not present

## 2019-06-17 DIAGNOSIS — F4323 Adjustment disorder with mixed anxiety and depressed mood: Secondary | ICD-10-CM

## 2019-06-17 MED ORDER — ESCITALOPRAM OXALATE 5 MG PO TABS: 5.0000 mg | ORAL_TABLET | Freq: Every day | ORAL | 3 refills | Status: DC

## 2019-06-17 NOTE — Progress Notes (Signed)
Patient: [name] Male    DOB: April 18, 1925   85 y.o.   MRN: [id number] Visit Date: 06/17/2019  Today's Provider: Lavon Paganini, MD   Chief Complaint  Patient presents with  . Anxiety   Subjective:     HPI   Virtual Visit via Telephone Note  I connected with [name] on 06/17/19 at  8:20 AM EST by telephone and verified that I am speaking with the correct person using two identifiers.  Location: Patient location: home Provider location: [facility] ([place]) Persons involved in the visit: patient, provider   I discussed the limitations, risks, security and privacy concerns of performing an evaluation and management service by telephone and the availability of in person appointments. I also discussed with the patient that there may be a patient responsible charge related to this service. The patient expressed understanding and agreed to proceed.  HPI Patient reports periodic forgetfulness.  States that he could not remember the name of Evette Georges, who he knows of very well.  Feeling anxious about not having his end of life documents in order.  Feels anxious about his lack of motivation to complete those.  Reports mood swings between feeling great and getting angry.  He finds that he is irritable.  He is not sure of any exacerbating factors.  He was previously taking Lexapro and symptoms have worsened since stopping that.   Reports intermittent depressive symptoms.  Feels like he projects this on his loved ones.  Depression screen [facility] 2/9 06/17/2019 06/10/2019 05/17/2019 11/17/2017 09/15/2017  Decreased Interest 3 0 0 1 3  Down, Depressed, Hopeless 0 3 0 0 1  PHQ - 2 Score 3 3 0 1 4  Altered sleeping 2 1 - - 2  Tired, decreased energy 0 2 - - 2  Change in appetite 0 0 - - 2  Feeling bad or failure about yourself  1 0 - - 0  Trouble concentrating 0 0 - - 0  Moving slowly or fidgety/restless 0 0 - - 0  Suicidal thoughts 0 0 - - 0  PHQ-9 Score 6 6 - -  10  Difficult doing work/chores Not difficult at all Not difficult at all - - Somewhat difficult     GAD 7 : Generalized Anxiety Score 06/17/2019  Nervous, Anxious, on Edge 1  Control/stop worrying 2  Worry too much - different things 3  Trouble relaxing 0  Restless 0  Easily annoyed or irritable 2  Afraid - awful might happen 0  Total GAD 7 Score 8  Anxiety Difficulty Somewhat difficult     Allergies  Allergen Reactions  . Toviaz [Fesoterodine Fumarate Er] Other (See Comments)    Acid reflux     Current Outpatient Medications:  .  amLODipine (NORVASC) 5 MG tablet, Take 1 tablet (5 mg total) by mouth daily., Disp: 90 tablet, Rfl: 3 .  aspirin EC 81 MG tablet, Take 81 mg by mouth daily., Disp: , Rfl:  .  Emollient (CERAVE) CREA, Apply 1 application topically 2 (two) times daily as needed (itching). , Disp: , Rfl:  .  escitalopram (LEXAPRO) 5 MG tablet, Take 1 tablet (5 mg total) by mouth daily., Disp: 30 tablet, Rfl: 3 .  fluticasone (FLONASE) 50 MCG/ACT nasal spray, Place 1 spray into both nostrils 2 (two) times daily., Disp: 16 g, Rfl: 5 .  hydrochlorothiazide (MICROZIDE) 12.5 MG capsule, Take 1 capsule (12.5 mg total) by mouth daily., Disp: 90 capsule, Rfl: 3 .  Multiple Vitamin (MULTI-VITAMIN) tablet, Take 1 tablet by mouth daily. , Disp: , Rfl:  .  naproxen sodium (ALEVE) 220 MG tablet, Take 220 mg by mouth daily as needed (pain/headache)., Disp: , Rfl:  .  Polyethyl Glycol-Propyl Glycol (SYSTANE OP), Place 1 drop into both eyes daily. , Disp: , Rfl:  .  polyethylene glycol powder (GLYCOLAX/MIRALAX) powder, Take 17 g by mouth daily. (Patient not taking: Reported on 04/05/2019), Disp: 255 g, Rfl: 11 .  tamsulosin (FLOMAX) 0.4 MG CAPS capsule, TAKE ONE CAPSULE BY MOUTH ONCE DAILY (Patient not taking: Reported on 04/05/2019), Disp: 90 capsule, Rfl: 3 .  triamcinolone ointment (KENALOG) 0.5 %, Apply 1 application topically 2 (two) times daily. (Patient not taking: Reported on  04/05/2019), Disp: 60 g, Rfl: 3 .  Zinc 50 MG TABS, Take 50 mg by mouth daily. , Disp: , Rfl:   Review of Systems  Constitutional: Negative.   Respiratory: Negative.   Cardiovascular: Negative.   Gastrointestinal: Negative.   Neurological: Negative.   Psychiatric/Behavioral: Negative.     Social History   Tobacco Use  . Smoking status: Former Smoker    Years: 10.00    Quit date: 07/08/1962    Years since quitting: 56.9  . Smokeless tobacco: Never Used  . Tobacco comment: quit 1964  Substance Use Topics  . Alcohol use: No      Objective:   There were no vitals taken for this visit. There were no vitals filed for this visit.There is no height or weight on file to calculate BMI.   Physical Exam Speaking in full sentences In NAD Oriented x3  No results found for any visits on 06/17/19.     Assessment & Plan    Follow Up Instructions:    I discussed the assessment and treatment plan with the patient. The patient was provided an opportunity to ask questions and all were answered. The patient agreed with the plan and demonstrated an understanding of the instructions.   The patient was advised to call back or seek an in-person evaluation if the symptoms worsen or if the condition fails to improve as anticipated.  I provided 25 minutes of non-face-to-face time during this encounter.   Problem List Items Addressed This Visit      Other   Adjustment disorder with mixed anxiety and depressed mood - Primary    Patient has a history of MDD that was previously well controlled on Lexapro He is now having increased irritability and anxiety as well as decreased motivation He centers this around his need to complete end-of-life documentation Will Start Lexapro 5 mg daily Discussed potential side effects, incl GI upset, sexual dysfunction, increased anxiety, and SI Discussed that it can take 6-8 weeks to reach full efficacy Contracted for safety - no SI/HI Discussed synergistic  effects of medications and therapy  As below, wonder if his memory may improve with treatment of anxiety symptoms Repeat PHQ-9 and GAD-7 at follow-up visit and consider dose titration      Memory difficulties    New problem with episodic lapses in recalling names or other facts Is independent in his ADLs and IADLs Some of this may be normal age-related cognitive decline Suspect that uncontrolled anxiety with some depressive symptoms may be contributing, similar to pseudodementia Will follow up on this at his next visit If patient is in person or on a video call next time, we will be able to complete MMSE as well  Return in about 3 months (around 09/15/2019) for MDD/GAD f/u.   The entirety of the information documented in the History of Present Illness, Review of Systems and Physical Exam were personally obtained by me. Portions of this information were initially documented by Ashley Royalty, CMA and reviewed by me for thoroughness and accuracy.    Torie Towle, Dionne Bucy, MD MPH [facility]

## 2019-06-17 NOTE — Assessment & Plan Note (Signed)
New problem with episodic lapses in recalling names or other facts Is independent in his ADLs and IADLs Some of this may be normal age-related cognitive decline Suspect that uncontrolled anxiety with some depressive symptoms may be contributing, similar to pseudodementia Will follow up on this at his next visit If patient is in person or on a video call next time, we will be able to complete MMSE as well

## 2019-06-17 NOTE — Assessment & Plan Note (Signed)
Patient has a history of MDD that was previously well controlled on Lexapro He is now having increased irritability and anxiety as well as decreased motivation He centers this around his need to complete end-of-life documentation Will Start Lexapro 5 mg daily Discussed potential side effects, incl GI upset, sexual dysfunction, increased anxiety, and SI Discussed that it can take 6-8 weeks to reach full efficacy Contracted for safety - no SI/HI Discussed synergistic effects of medications and therapy  As below, wonder if his memory may improve with treatment of anxiety symptoms Repeat PHQ-9 and GAD-7 at follow-up visit and consider dose titration

## 2019-07-15 ENCOUNTER — Telehealth: Payer: Medicare Other

## 2019-07-23 DIAGNOSIS — Z23 Encounter for immunization: Secondary | ICD-10-CM | POA: Diagnosis not present

## 2019-08-25 ENCOUNTER — Telehealth: Payer: Self-pay | Admitting: Family Medicine

## 2019-08-25 DIAGNOSIS — I10 Essential (primary) hypertension: Secondary | ICD-10-CM

## 2019-08-25 MED ORDER — ESCITALOPRAM OXALATE 5 MG PO TABS: 5.0000 mg | ORAL_TABLET | Freq: Every day | ORAL | 3 refills | Status: DC

## 2019-08-25 MED ORDER — AMLODIPINE BESYLATE 5 MG PO TABS: 5.0000 mg | ORAL_TABLET | Freq: Every day | ORAL | 3 refills | Status: DC

## 2019-08-25 MED ORDER — HYDROCHLOROTHIAZIDE 12.5 MG PO CAPS: 12.5000 mg | ORAL_CAPSULE | Freq: Every day | ORAL | 3 refills | Status: DC

## 2019-08-25 MED ORDER — FLUTICASONE PROPIONATE 50 MCG/ACT NA SUSP: 1.0000 | Freq: Every day | NASAL | 5 refills | Status: DC

## 2019-08-25 NOTE — Telephone Encounter (Signed)
Please advise 

## 2019-08-25 NOTE — Telephone Encounter (Signed)
He is followed by CCM, so we could have RN or PharmD go over this with them

## 2019-08-25 NOTE — Telephone Encounter (Signed)
Pt's spouse is calling in for assistance. Spouse says that she is going to take over helping pt with his medications. Spouse would like to be sure of what medications pt is taking and also the directions.    Please assist.  CB: [phone]

## 2019-08-25 NOTE — Telephone Encounter (Signed)
I will contact the [facility] tomorrow- I made him a medicine chart almost a year ago now, I will review with him! Thank you!!   [name], PharmD Clinical Pharmacist [place] [phone]

## 2019-08-25 NOTE — Telephone Encounter (Signed)
FYI

## 2019-08-26 ENCOUNTER — Telehealth: Payer: Medicare Other

## 2019-09-17 NOTE — Progress Notes (Signed)
This encounter was created in error - please disregard.

## 2019-10-11 ENCOUNTER — Other Ambulatory Visit: Payer: Self-pay

## 2019-10-11 ENCOUNTER — Ambulatory Visit (INDEPENDENT_AMBULATORY_CARE_PROVIDER_SITE_OTHER): Payer: Medicare Other | Admitting: Family Medicine

## 2019-10-11 ENCOUNTER — Encounter: Payer: Self-pay | Admitting: Family Medicine

## 2019-10-11 VITALS — BP 144/79 | HR 91 | Temp 96.9°F | Wt 161.0 lb

## 2019-10-11 DIAGNOSIS — Z8379 Family history of other diseases of the digestive system: Secondary | ICD-10-CM | POA: Diagnosis not present

## 2019-10-11 DIAGNOSIS — R252 Cramp and spasm: Secondary | ICD-10-CM

## 2019-10-11 DIAGNOSIS — I10 Essential (primary) hypertension: Secondary | ICD-10-CM | POA: Diagnosis not present

## 2019-10-11 NOTE — Progress Notes (Signed)
Established patient visit      Patient: [name]   DOB: 1924-12-20   86 y.o. Male  MRN: [id number] Visit Date: 10/11/2019  Today's healthcare provider: Vernie Murders, PA  Subjective:    Chief Complaint  Patient presents with  . Leg Pain  . Hypertension   Leg Pain  Incident onset: "several months" There was no injury mechanism. The pain is present in the left leg and right leg. The quality of the pain is described as cramping. The pain has been worsening since onset. Associated symptoms include numbness. He reports no foreign bodies present. He has tried nothing for the symptoms.    Hypertension, follow-up:  BP Readings from Last 3 Encounters:  10/11/19 (!) 144/79  06/10/19 126/77  07/30/18 (!) 149/72    He was last seen for hypertension 05/21/2018 BP at that visit was 138/68. Management changes since that visit include no change. He reports good compliance with treatment. He is not having side effects.  He is not exercising. He is not adherent to low salt diet.   Outside blood pressures are being changed and readings are elevated. He is experiencing none.  Patient denies chest pain, chest pressure/discomfort, irregular heart beat, lower extremity edema and palpitations.   Cardiovascular risk factors include advanced age (older than 48 for men, 70 for women), dyslipidemia, hypertension and male gender.  Use of agents associated with hypertension: none.     Weight trend: stable Wt Readings from Last 3 Encounters:  10/11/19 161 lb (73 kg)  07/30/18 167 lb (75.8 kg)  07/16/18 162 lb (73.5 kg)    Current diet: in general, a "healthy" diet    ------------------------------------------------------------------------   Patient Active Problem List   Diagnosis Date Noted  . Adjustment disorder with mixed anxiety and depressed mood 06/17/2019  . Memory difficulties 06/17/2019  . Senile purpura ([place]) 05/21/2018  . Frequent falls 04/01/2017  . Constipation  03/31/2017  . Eczema 02/12/2017  . Left leg pain 02/12/2017  . Urinary frequency 09/18/2015  . Allergic rhinitis 06/27/2015  . H/O: CVA (cerebrovascular accident) 06/27/2015  . DD (diverticular disease) 06/27/2015  . Benign essential HTN 06/27/2015  . Hypercholesterolemia 06/27/2015  . Fungal infection of nail 06/27/2015  . Prediabetes 03/15/2015  . Prostate cancer ([place]) 12/18/2014  . Arthritis, degenerative 01/12/2014  . H/O malignant neoplasm of prostate 12/03/2012  . Urge incontinence 11/09/2012  . Obstruction of urinary tract 11/09/2012  . Dermatophytosis of groin 09/29/2012  . 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 Incomplete bladder emptying 09/29/2012   Past Medical History:  Diagnosis Date  . Arthritis   . Bladder outlet obstruction   . Erectile dysfunction   . GERD (gastroesophageal reflux disease)   . Hard of hearing   . HBP (high blood pressure)   . History of nephrolithiasis   . HLD (hyperlipidemia)   . Incomplete bladder emptying   . Nocturia   . Osteoporosis   . Prostate cancer ([place])   . Shingles 2014  . Sinus problem   . Stomach ulcer   . Stroke ([place]) 2012  . Urinary frequency   . Urinary leakage    Past Surgical History:  Procedure Laterality Date  . CATARACT EXTRACTION  2000, 2001  . CHOLECYSTECTOMY  1972  . PROSTATE SURGERY  2008/2009  . TONSILLECTOMY    . UMBILICAL HERNIA REPAIR     Social History   Socioeconomic History  . Marital status: Married    Spouse name: Not on  file  . Number of children: 2  . Years of education: Not on file  . Highest education level: Master's degree (e.g., MA, MS, MEng, MEd, MSW, MBA)  Occupational History  . Occupation: retired  Tobacco Use  . Smoking status: Former Smoker    Years: 10.00    Quit date: 07/08/1962    Years since quitting: 57.2  . Smokeless tobacco: Never Used  . Tobacco comment: quit 1964  Substance and Sexual Activity  . Alcohol use: No  . Drug use: No  . Sexual  activity: Not on file  Other Topics Concern  . Not on file  Social History Narrative  . Not on file   Social Determinants of Health   Financial Resource Strain:   . Difficulty of Paying Living Expenses:   Food Insecurity:   . Worried About Charity fundraiser in the Last Year:   . Arboriculturist in the Last Year:   Transportation Needs:   . Film/video editor (Medical):   [name] Lack of Transportation (Non-Medical):   Physical Activity: Inactive  . Days of Exercise per Week: 0 days  . Minutes of Exercise per Session: 0 min  Stress: Stress Concern Present  . Feeling of Stress : Rather much  Social Connections: Unknown  . Frequency of Communication with Friends and Family: Patient refused  . Frequency of Social Gatherings with Friends and Family: Patient refused  . Attends Religious Services: Patient refused  . Active Member of Clubs or Organizations: Patient refused  . Attends Archivist Meetings: Patient refused  . Marital Status: Patient refused  Intimate Partner Violence: Unknown  . Fear of Current or Ex-Partner: Patient refused  . Emotionally Abused: Patient refused  . Physically Abused: Patient refused  . Sexually Abused: Patient refused   Family History  Problem Relation Age of Onset  . Cancer Mother   . Stroke Father   . Stroke Sister   . Prostate cancer Brother   . Kidney disease Neg Hx   . Kidney cancer Neg Hx   . Bladder Cancer Neg Hx    Allergies  Allergen Reactions  . Toviaz [Fesoterodine Fumarate Er] Other (See Comments)    Acid reflux       Medications: Outpatient Medications Prior to Visit  Medication Sig  . amLODipine (NORVASC) 5 MG tablet Take 1 tablet (5 mg total) by mouth daily.  [name] aspirin EC 81 MG tablet Take 81 mg by mouth daily.  . Emollient (CERAVE) CREA Apply 1 application topically 2 (two) times daily as needed (itching).   [name] escitalopram (LEXAPRO) 5 MG tablet Take 1 tablet (5 mg total) by mouth daily.  . fluticasone  (FLONASE) 50 MCG/ACT nasal spray Place 1 spray into both nostrils daily.  . hydrochlorothiazide (MICROZIDE) 12.5 MG capsule Take 1 capsule (12.5 mg total) by mouth daily.  . Multiple Vitamin (MULTI-VITAMIN) tablet Take 1 tablet by mouth daily.   . naproxen sodium (ALEVE) 220 MG tablet Take 220 mg by mouth daily as needed (pain/headache).  Vladimir Faster Glycol-Propyl Glycol (SYSTANE OP) Place 1 drop into both eyes daily.   . Zinc 50 MG TABS Take 50 mg by mouth daily.   . polyethylene glycol powder (GLYCOLAX/MIRALAX) powder Take 17 g by mouth daily. (Patient not taking: Reported on 04/05/2019)  . tamsulosin (FLOMAX) 0.4 MG CAPS capsule TAKE ONE CAPSULE BY MOUTH ONCE DAILY (Patient not taking: Reported on 04/05/2019)  . triamcinolone ointment (KENALOG) 0.5 % Apply 1 application topically 2 (two)  times daily. (Patient not taking: Reported on 04/05/2019)   No facility-administered medications prior to visit.    Review of Systems  Constitutional: Negative.   HENT: Negative.   Respiratory: Negative.   Cardiovascular: Negative.   Gastrointestinal: Negative.   Skin:       Itchy dry skin on lower legs.  Neurological: Positive for numbness.       Objective:    BP (!) 144/79 (BP Location: Left Arm, Patient Position: Sitting, Cuff Size: Large)   Pulse 91   Temp (!) 96.9 F (36.1 C) (Temporal)   Wt 161 lb (73 kg)   BMI 23.78 kg/m    Physical Exam Constitutional:      General: He is not in acute distress.    Appearance: He is well-developed.  HENT:     Head: Normocephalic and atraumatic.     Right Ear: Hearing normal.     Left Ear: Hearing normal.     Nose: Nose normal.  Eyes:     General: Lids are normal. No scleral icterus.       Right eye: No discharge.        Left eye: No discharge.     Conjunctiva/sclera: Conjunctivae normal.  Cardiovascular:     Rate and Rhythm: Normal rate and regular rhythm.     Heart sounds: Murmur present.     Comments: Grade 99991111 holosystolic  murmur. Pulmonary:     Effort: Pulmonary effort is normal. No respiratory distress.  Musculoskeletal:        General: Normal range of motion.     Right lower leg: Edema present.     Left lower leg: Edema present.     Comments: Dry skin with 1+ pitting edema with pruritis. Good pulses bilaterally.  Skin:    Findings: No lesion or rash.  Neurological:     Mental Status: He is alert and oriented to person, place, and time.  Psychiatric:        Speech: Speech normal.        Behavior: Behavior normal.        Thought Content: Thought content normal.        Assessment & Plan:    1. Leg cramps Having some cramps in calves over the past several months. No dyspnea but slight pitting edema of lower legs to just above ankles. Good pulses. May need support hose. Recheck CBC and CMP to rule out electrolyte imbalance. - CBC with Differential/Platelet - Comprehensive metabolic panel  2. Benign essential HTN Had elevation of BP in the past week. States he is using Amlodipine 5 mg qd. Has not been using HCTZ regularly. BP better today. Should use BP medication daily and recheck labs. - CBC with Differential/Platelet - Comprehensive metabolic panel  3. Family history of celiac disease One daughter diagnosed with celiac disease and was told he should be tested. No GI symptoms. Will check celiac panel. - Celiac Disease Panel      Vernie Murders, [place] [phone] (phone) [phone] (fax)  [place]

## 2019-10-12 DIAGNOSIS — R252 Cramp and spasm: Secondary | ICD-10-CM | POA: Diagnosis not present

## 2019-10-12 DIAGNOSIS — I10 Essential (primary) hypertension: Secondary | ICD-10-CM | POA: Diagnosis not present

## 2019-10-12 DIAGNOSIS — Z8379 Family history of other diseases of the digestive system: Secondary | ICD-10-CM | POA: Diagnosis not present

## 2019-10-14 LAB — CBC WITH DIFFERENTIAL/PLATELET
Basophils Absolute: 0 10*3/uL (ref 0.0–0.2)
Basos: 1 %
EOS (ABSOLUTE): 0.1 10*3/uL (ref 0.0–0.4)
Eos: 3 %
Hematocrit: 42.9 % (ref 37.5–51.0)
Hemoglobin: 14.3 g/dL (ref 13.0–17.7)
Immature Grans (Abs): 0 10*3/uL (ref 0.0–0.1)
Immature Granulocytes: 0 %
Lymphocytes Absolute: 0.7 10*3/uL (ref 0.7–3.1)
Lymphs: 17 %
MCH: 30.1 pg (ref 26.6–33.0)
MCHC: 33.3 g/dL (ref 31.5–35.7)
MCV: 90 fL (ref 79–97)
Monocytes Absolute: 0.5 10*3/uL (ref 0.1–0.9)
Monocytes: 12 %
Neutrophils Absolute: 2.8 10*3/uL (ref 1.4–7.0)
Neutrophils: 67 %
Platelets: 188 10*3/uL (ref 150–450)
RBC: 4.75 x10E6/uL (ref 4.14–5.80)
RDW: 14 % (ref 11.6–15.4)
WBC: 4.2 10*3/uL (ref 3.4–10.8)

## 2019-10-14 LAB — COMPREHENSIVE METABOLIC PANEL
ALT: 20 IU/L (ref 0–44)
AST: 29 IU/L (ref 0–40)
Albumin/Globulin Ratio: 1.8 (ref 1.2–2.2)
Albumin: 4.2 g/dL (ref 3.5–4.6)
Alkaline Phosphatase: 67 IU/L (ref 39–117)
BUN/Creatinine Ratio: 33 — ABNORMAL HIGH (ref 10–24)
BUN: 27 mg/dL (ref 10–36)
Bilirubin Total: 0.5 mg/dL (ref 0.0–1.2)
CO2: 23 mmol/L (ref 20–29)
Calcium: 10 mg/dL (ref 8.6–10.2)
Chloride: 105 mmol/L (ref 96–106)
Creatinine, Ser: 0.81 mg/dL (ref 0.76–1.27)
GFR calc Af Amer: 89 mL/min/{1.73_m2} (ref >59)
GFR calc non Af Amer: 77 mL/min/{1.73_m2} (ref >59)
Globulin, Total: 2.3 g/dL (ref 1.5–4.5)
Glucose: 94 mg/dL (ref 65–99)
Potassium: 4.1 mmol/L (ref 3.5–5.2)
Sodium: 141 mmol/L (ref 134–144)
Total Protein: 6.5 g/dL (ref 6.0–8.5)

## 2019-10-14 LAB — CELIAC DISEASE PANEL
Endomysial IgA: NEGATIVE
IgA/Immunoglobulin A, Serum: 172 mg/dL (ref 61–437)
Transglutaminase IgA: <2 U/mL (ref 0–3)

## 2019-10-15 ENCOUNTER — Telehealth: Payer: Self-pay | Admitting: *Deleted

## 2019-10-15 NOTE — Telephone Encounter (Signed)
Tried calling patient with lab results. No answer and no vm. Okay for [facility] triage to give patient results.

## 2019-10-15 NOTE — Telephone Encounter (Signed)
-----   Message from Margo Common, [place] sent at 10/14/2019  4:46 PM EDT ----- All celiac tests are negative. Drink plenty of fluids to reduce risk for cramps.

## 2019-10-19 NOTE — Telephone Encounter (Signed)
Pt. Called back and given results, verbalizes understanding.

## 2019-10-22 ENCOUNTER — Telehealth: Payer: Self-pay | Admitting: *Deleted

## 2019-10-22 NOTE — Telephone Encounter (Signed)
Patient request call to him with lab results- seems his daughter has been getting notified of results- could not see any note to that- but called the office and informed them.

## 2019-11-16 ENCOUNTER — Telehealth: Payer: Self-pay

## 2019-11-16 NOTE — Telephone Encounter (Signed)
Copied from [place] [id number]. Topic: General - Other >> Nov 15, 2019 10:13 AM Yvette Rack wrote: Reason for CRM: Janci Minor with [facility] called to report patient had both Covid vaccines (Moderna) on 07/23/19 and 08/23/19. Cb# [phone]

## 2019-12-09 ENCOUNTER — Ambulatory Visit: Payer: Self-pay | Admitting: *Deleted

## 2019-12-09 NOTE — Patient Instructions (Signed)
Visit Information  Goals Addressed            This Visit's Progress   . COMPLETED: I think I am doing ok but can you call me back just to check on me (pt-stated)       Current Barriers:  . Chronic Disease Management support and education needs related to frequent falls  Nurse Case Manager Clinical Goal(s):  [name] Over the next 30 days, the patient will demonstrate ongoing self health care management ability as evidenced by adhering to all fall precautions previously discussed and not sustaining additional falls . Over the next 30 days, patient will report adhering to a less restrictive diet to promote weight maintenance instead of weight loss  Interventions:  . Discussed plans with patient for ongoing care management follow up and provided patient with direct contact information for care management team  . Provided patient with RN CM contact information . Reinforced previously discussed fall precautions . Discussed nutritional status and wife's concern for patients extreme nutritional restrictions  . Provided patient with educational information related to complications of malnutrition  Patient Self Care Activities:  . Contact CCM RN CM if additional needs arise . Lessen dietary restrictions to reduce weight loss  Please see past updates related to this goal by clicking on the "Past Updates" button in the selected goal         Case CLosed. Patient at ALF.   [place] Management Coordinator Direct Dial:  [phone]  Fax: [phone]

## 2019-12-09 NOTE — Chronic Care Management (AMB) (Signed)
Chronic Care Management   Follow Up Note   12/09/2019 Name: [name] MRN: [id number] DOB: [birth year]  Referred by: [name], MD Reason for referral : Care Coordination (CCM Case Closure)   [name] is a 86 y.o. year old male who is a primary care patient of Bacigalupo, Dionne Bucy, MD. The CCM team was consulted for assistance with chronic disease management and care coordination needs.    Review of patient status, including review of consultants reports, relevant laboratory and other test results, and collaboration with appropriate care team members and the patient's provider was performed as part of comprehensive patient evaluation and provision of chronic care management services.    SDOH (Social Determinants of Health) assessments performed: Yes See Care Plan activities for detailed interventions related to [facility])       Outpatient Encounter Medications as of 12/09/2019  Medication Sig Note  . amLODipine (NORVASC) 5 MG tablet Take 1 tablet (5 mg total) by mouth daily.   [name] aspirin EC 81 MG tablet Take 81 mg by mouth daily.   . Emollient (CERAVE) CREA Apply 1 application topically 2 (two) times daily as needed (itching).    [name] escitalopram (LEXAPRO) 5 MG tablet Take 1 tablet (5 mg total) by mouth daily.   . fluticasone (FLONASE) 50 MCG/ACT nasal spray Place 1 spray into both nostrils daily.   . hydrochlorothiazide (MICROZIDE) 12.5 MG capsule Take 1 capsule (12.5 mg total) by mouth daily.   . Multiple Vitamin (MULTI-VITAMIN) tablet Take 1 tablet by mouth daily.    . naproxen sodium (ALEVE) 220 MG tablet Take 220 mg by mouth daily as needed (pain/headache).   Vladimir Faster Glycol-Propyl Glycol (SYSTANE OP) Place 1 drop into both eyes daily.    . polyethylene glycol powder (GLYCOLAX/MIRALAX) powder Take 17 g by mouth daily. (Patient not taking: Reported on 04/05/2019)   . tamsulosin (FLOMAX) 0.4 MG CAPS capsule TAKE ONE CAPSULE BY MOUTH ONCE DAILY (Patient not taking:  Reported on 04/05/2019) 04/11/2018: #90 filled 12/18/17 [facility] - pt thinks he is still taking "the medication from the urologist"  . triamcinolone ointment (KENALOG) 0.5 % Apply 1 application topically 2 (two) times daily. (Patient not taking: Reported on 04/05/2019)   . Zinc 50 MG TABS Take 50 mg by mouth daily.  01/01/2019: "for body odor" recommend patient DC   No facility-administered encounter medications on file as of 12/09/2019.     Objective:   Goals Addressed            This Visit's Progress   . COMPLETED: I think I am doing ok but can you call me back just to check on me (pt-stated)       Current Barriers:  . Chronic Disease Management support and education needs related to frequent falls  Nurse Case Manager Clinical Goal(s):  [name] Over the next 30 days, the patient will demonstrate ongoing self health care management ability as evidenced by adhering to all fall precautions previously discussed and not sustaining additional falls . Over the next 30 days, patient will report adhering to a less restrictive diet to promote weight maintenance instead of weight loss  Interventions:  . Discussed plans with patient for ongoing care management follow up and provided patient with direct contact information for care management team  . Provided patient with RN CM contact information . Reinforced previously discussed fall precautions . Discussed nutritional status and wife's concern for patients extreme nutritional restrictions  . Provided patient with educational information related  to complications of malnutrition  Patient Self Care Activities:  . Contact CCM RN CM if additional needs arise . Lessen dietary restrictions to reduce weight loss  Please see past updates related to this goal by clicking on the "Past Updates" button in the selected goal         Plan:   CCM enrollment status changed to "previously enrolled" as patient is now at ALF level of care with CM oversight.. Case  closed to case management services in primary care home.    [facility] Management Coordinator Direct Dial:  [phone]  Fax: [phone]

## 2019-12-10 ENCOUNTER — Ambulatory Visit (INDEPENDENT_AMBULATORY_CARE_PROVIDER_SITE_OTHER): Payer: Medicare Other | Admitting: Podiatry

## 2019-12-10 ENCOUNTER — Encounter: Payer: Self-pay | Admitting: Podiatry

## 2019-12-10 ENCOUNTER — Other Ambulatory Visit: Payer: Self-pay

## 2019-12-10 DIAGNOSIS — L989 Disorder of the skin and subcutaneous tissue, unspecified: Secondary | ICD-10-CM | POA: Diagnosis not present

## 2019-12-10 DIAGNOSIS — M79676 Pain in unspecified toe(s): Secondary | ICD-10-CM | POA: Diagnosis not present

## 2019-12-10 DIAGNOSIS — B351 Tinea unguium: Secondary | ICD-10-CM | POA: Diagnosis not present

## 2019-12-12 NOTE — Progress Notes (Signed)
   SUBJECTIVE Patient presents to office today complaining of elongated, thickened nails that cause pain while ambulating in shoes.  He is unable to trim his own nails.  Patient also complains of symptomatic calluses to the plantar aspect of the bilateral feet, that cause pain.  Patient is here for further evaluation and treatment.  Past Medical History:  Diagnosis Date  . Arthritis   . Bladder outlet obstruction   . Erectile dysfunction   . GERD (gastroesophageal reflux disease)   . Hard of hearing   . HBP (high blood pressure)   . History of nephrolithiasis   . HLD (hyperlipidemia)   . Incomplete bladder emptying   . Nocturia   . Osteoporosis   . Prostate cancer ([place])   . Shingles 2014  . Sinus problem   . Stomach ulcer   . Stroke ([place]) 2012  . Urinary frequency   . Urinary leakage     OBJECTIVE General Patient is awake, alert, and oriented x 3 and in no acute distress. Derm Skin is dry and supple bilateral. Negative open lesions or macerations. Remaining integument unremarkable. Nails are tender, long, thickened and dystrophic with subungual debris, consistent with onychomycosis, 1-5 bilateral. No signs of infection noted.  Hyperkeratotic callus lesions also noted to the bilateral feet x5 Vasc  DP and PT pedal pulses palpable bilaterally. Temperature gradient within normal limits.  Neuro Epicritic and protective threshold sensation grossly intact bilaterally.  Musculoskeletal Exam No symptomatic pedal deformities noted bilateral. Muscular strength within normal limits.  ASSESSMENT 1. Onychodystrophic nails 1-5 bilateral with hyperkeratosis of nails.  2. Onychomycosis of nail due to dermatophyte bilateral 3.  Preulcerative callus lesions bilateral feet x5  4.  Pain in foot bilateral  PLAN OF CARE 1. Patient evaluated today.  2. Instructed to maintain good pedal hygiene and foot care.  3. Mechanical debridement of nails 1-5 bilaterally performed using a nail nipper. Filed  with dremel without incident.  4.  Excisional debridement of the preulcerative callus lesions was performed using a chisel blade without incident or bleeding  5.  Return to clinic in 3 mos.    Edrick Kins, DPM [facility]  Dr. Edrick Kins, [place]                                        [place], [place] [address]                Office [phone]  Fax [phone]

## 2019-12-16 ENCOUNTER — Ambulatory Visit: Payer: Medicare Other | Admitting: Family Medicine

## 2019-12-28 ENCOUNTER — Ambulatory Visit: Payer: Medicare Other | Admitting: Podiatry

## 2020-03-16 ENCOUNTER — Other Ambulatory Visit: Payer: Self-pay

## 2020-03-16 ENCOUNTER — Ambulatory Visit (INDEPENDENT_AMBULATORY_CARE_PROVIDER_SITE_OTHER): Payer: Medicare Other | Admitting: Podiatry

## 2020-03-16 ENCOUNTER — Encounter: Payer: Self-pay | Admitting: Podiatry

## 2020-03-16 DIAGNOSIS — B351 Tinea unguium: Secondary | ICD-10-CM | POA: Diagnosis not present

## 2020-03-16 DIAGNOSIS — M79676 Pain in unspecified toe(s): Secondary | ICD-10-CM

## 2020-03-16 DIAGNOSIS — L989 Disorder of the skin and subcutaneous tissue, unspecified: Secondary | ICD-10-CM

## 2020-03-16 DIAGNOSIS — Q828 Other specified congenital malformations of skin: Secondary | ICD-10-CM | POA: Insufficient documentation

## 2020-03-16 NOTE — Progress Notes (Signed)
This patient returns to the office for evaluation and treatment of long thick painful nails .  This patient is unable to trim his own nails since the patient cannot reach his feet.  Patient says the nails are painful walking and wearing his shoes. Patient also has painful callus on his left forefoot.   He returns for preventive foot care services. Patient presents to the office with his daughter.  General Appearance  Alert, conversant and in no acute stress.  Vascular  Dorsalis pedis and posterior tibial  pulses are palpable  bilaterally.  Capillary return is within normal limits  bilaterally. Temperature is within normal limits  bilaterally.  Neurologic  Senn-Weinstein monofilament wire test within normal limits  bilaterally. Muscle power within normal limits bilaterally.  Nails Thick disfigured discolored nails with subungual debris  from hallux to fifth toes bilaterally. No evidence of bacterial infection or drainage bilaterally.  Orthopedic  No limitations of motion  feet .  No crepitus or effusions noted.  No bony pathology or digital deformities noted.  HAV 1st MPJ  Left foot.  Skin  normotropic skin  noted bilaterally.  No signs of infections or ulcers noted.   Porokeratosis left foot.  Onychomycosis  Pain in toes right foot  Pain in toes left foot  Debridement  of nails  1-5  B/L with a nail nipper.  Nails were then filed using a dremel tool with no incidents.    RTC 3 months.  Gardiner Barefoot DPM

## 2020-04-25 DIAGNOSIS — Z23 Encounter for immunization: Secondary | ICD-10-CM | POA: Diagnosis not present

## 2020-06-07 ENCOUNTER — Telehealth: Payer: Self-pay | Admitting: Physician Assistant

## 2020-06-07 ENCOUNTER — Telehealth: Payer: Self-pay

## 2020-06-07 ENCOUNTER — Telehealth (INDEPENDENT_AMBULATORY_CARE_PROVIDER_SITE_OTHER): Payer: Medicare Other | Admitting: Physician Assistant

## 2020-06-07 DIAGNOSIS — M25552 Pain in left hip: Secondary | ICD-10-CM

## 2020-06-07 DIAGNOSIS — M25551 Pain in right hip: Secondary | ICD-10-CM | POA: Diagnosis not present

## 2020-06-07 DIAGNOSIS — W19XXXA Unspecified fall, initial encounter: Secondary | ICD-10-CM

## 2020-06-07 DIAGNOSIS — I10 Essential (primary) hypertension: Secondary | ICD-10-CM

## 2020-06-07 DIAGNOSIS — R7303 Prediabetes: Secondary | ICD-10-CM

## 2020-06-07 NOTE — Telephone Encounter (Signed)
See office note

## 2020-06-07 NOTE — Telephone Encounter (Signed)
[name] from [facility] is requesting an x-ray be ordered to make sure the patient didn't have a break from fall.    States the wife called earlier and hasn't heard back.

## 2020-06-07 NOTE — Telephone Encounter (Signed)
Copied from [place] [id number]. Topic: Quick Communication - See Telephone Encounter >> Jun 07, 2020 11:50 AM [place] wrote: CRM for notification. See Telephone encounter for: 06/07/20. Pt has an appt on 12/9 and wife states he thinks may be having diabetes, and has fell some, other issues... They are thinking if blood work could be ordered early A1C they would be glad to make a special appt for labs if orders are requested [place] cb [phone]

## 2020-06-08 NOTE — Telephone Encounter (Signed)
I think he needs to be seen sooner to assess after fall. There are some openings for some providers today. Could they bring him in?

## 2020-06-08 NOTE — Telephone Encounter (Signed)
So [name] already ordered lab work and Production manager. He can get those done at outpatient imaging and [facility]. No need for our appt tomorrow unless something has changed.

## 2020-06-08 NOTE — Telephone Encounter (Signed)
NA. Will try again.

## 2020-06-08 NOTE — Telephone Encounter (Signed)
I did a telephone visit with this patient on 06/07/2020 and ordered appropriate xrays. Since he is independent living he will need to go to outpatient imaging.

## 2020-06-08 NOTE — Progress Notes (Signed)
Subjective:   [name] is a 86 y.o. male who presents for Medicare Annual/Subsequent preventive examination.  I connected with [employer] today by telephone and verified that I am speaking with the correct person using two identifiers. Location patient: home Location provider: work Persons participating in the virtual visit: patient, provider.   I discussed the limitations, risks, security and privacy concerns of performing an evaluation and management service by telephone and the availability of in person appointments. I also discussed with the patient that there may be a patient responsible charge related to this service. The patient expressed understanding and verbally consented to this telephonic visit.    Interactive audio and video telecommunications were attempted between this provider and patient, however failed, due to patient having technical difficulties OR patient did not have access to video capability.  We continued and completed visit with audio only.   Review of Systems    N/A  Cardiac Risk Factors include: advanced age (>20men, >7 women);male gender;hypertension     Objective:    There were no vitals filed for this visit. There is no height or weight on file to calculate BMI.  Advanced Directives 06/12/2020 06/10/2019 11/17/2017 09/13/2016 12/15/2015 01/05/2015  Does Patient Have a Medical Advance Directive? Yes No No No No Yes  Type of Paramedic of [place];Living will - - - - Living will  Does patient want to make changes to medical advance directive? - - - - - No - Patient declined  Copy of [place] in Chart? No - copy requested - - - - -  Would patient like information on creating a medical advance directive? - No - Patient declined Yes (MAU/Ambulatory/Procedural Areas - Information given) Yes (ED - Information included in AVS) - -    Current Medications (verified) Outpatient Encounter Medications as of  06/12/2020  Medication Sig  . amLODipine (NORVASC) 5 MG tablet Take 1 tablet (5 mg total) by mouth daily.  [name] aspirin EC 81 MG tablet Take 81 mg by mouth daily.  . hydrochlorothiazide (MICROZIDE) 12.5 MG capsule Take 1 capsule (12.5 mg total) by mouth daily.  . Multiple Vitamin (MULTI-VITAMIN) tablet Take 1 tablet by mouth daily.   . naproxen sodium (ALEVE) 220 MG tablet Take 220 mg by mouth daily as needed (pain/headache).  Vladimir Faster Glycol-Propyl Glycol (SYSTANE OP) Place 1 drop into both eyes daily.   . polyethylene glycol powder (GLYCOLAX/MIRALAX) powder Take 17 g by mouth daily.  . Emollient (CERAVE) CREA Apply 1 application topically 2 (two) times daily as needed (itching).  (Patient not taking: Reported on 06/12/2020)  . escitalopram (LEXAPRO) 5 MG tablet Take 1 tablet (5 mg total) by mouth daily. (Patient not taking: Reported on 06/12/2020)  . fluticasone (FLONASE) 50 MCG/ACT nasal spray Place 1 spray into both nostrils daily. (Patient not taking: Reported on 06/12/2020)  . tamsulosin (FLOMAX) 0.4 MG CAPS capsule TAKE ONE CAPSULE BY MOUTH ONCE DAILY (Patient not taking: Reported on 06/12/2020)  . triamcinolone ointment (KENALOG) 0.5 % Apply 1 application topically 2 (two) times daily. (Patient not taking: Reported on 06/12/2020)  . Zinc 50 MG TABS Take 50 mg by mouth daily.  (Patient not taking: Reported on 06/12/2020)   No facility-administered encounter medications on file as of 06/12/2020.    Allergies (verified) Toviaz [fesoterodine fumarate er]   History: Past Medical History:  Diagnosis Date  . Arthritis   . Bladder outlet obstruction   . Erectile dysfunction   . GERD (  gastroesophageal reflux disease)   . Hard of hearing   . HBP (high blood pressure)   . History of nephrolithiasis   . HLD (hyperlipidemia)   . Incomplete bladder emptying   . Nocturia   . Osteoporosis   . Prostate cancer ([place])   . Shingles 2014  . Sinus problem   . Stomach ulcer   . Stroke ([place]) 2012  .  Urinary frequency   . Urinary leakage    Past Surgical History:  Procedure Laterality Date  . CATARACT EXTRACTION  2000, 2001  . CHOLECYSTECTOMY  1972  . PROSTATE SURGERY  2008/2009  . TONSILLECTOMY    . UMBILICAL HERNIA REPAIR     Family History  Problem Relation Age of Onset  . Cancer Mother   . Stroke Father   . Stroke Sister   . Prostate cancer Brother   . Kidney disease Neg Hx   . Kidney cancer Neg Hx   . Bladder Cancer Neg Hx    Social History   Socioeconomic History  . Marital status: Married    Spouse name: Not on file  . Number of children: 2  . Years of education: Not on file  . Highest education level: Master's degree (e.g., MA, MS, MEng, MEd, MSW, MBA)  Occupational History  . Occupation: retired  Tobacco Use  . Smoking status: Former Smoker    Years: 10.00    Quit date: 07/08/1962    Years since quitting: 57.9  . Smokeless tobacco: Never Used  . Tobacco comment: quit 1964  Vaping Use  . Vaping Use: Never used  Substance and Sexual Activity  . Alcohol use: Yes    Alcohol/week: 0.0 - 1.0 standard drinks  . Drug use: No  . Sexual activity: Not on file  Other Topics Concern  . Not on file  Social History Narrative  . Not on file   Social Determinants of Health   Financial Resource Strain: Low Risk   . Difficulty of Paying Living Expenses: Not hard at all  Food Insecurity: No Food Insecurity  . Worried About Charity fundraiser in the Last Year: Never true  . Ran Out of Food in the Last Year: Never true  Transportation Needs: No Transportation Needs  . Lack of Transportation (Medical): No  . Lack of Transportation (Non-Medical): No  Physical Activity: Sufficiently Active  . Days of Exercise per Week: 6 days  . Minutes of Exercise per Session: 40 min  Stress: No Stress Concern Present  . Feeling of Stress : Not at all  Social Connections: Moderately Isolated  . Frequency of Communication with Friends and Family: Once a week  . Frequency of  Social Gatherings with Friends and Family: More than three times a week  . Attends Religious Services: Never  . Active Member of Clubs or Organizations: No  . Attends Archivist Meetings: Never  . Marital Status: Married    Tobacco Counseling Counseling given: Not Answered Comment: quit 1964   Clinical Intake:  Pre-visit preparation completed: Yes  Pain : No/denies pain     Nutritional Risks: None Diabetes: No  How often do you need to have someone help you when you read instructions, pamphlets, or other written materials from your doctor or pharmacy?: 1 - Never  Diabetic? No  Interpreter Needed?: No  Information entered by :: [facility], LPN   Activities of Daily Living In your present state of health, do you have any difficulty performing the following activities: 06/12/2020  Hearing?  N  Comment Wears bilateral hearing aids.  Vision? N  Difficulty concentrating or making decisions? N  Walking or climbing stairs? N  Dressing or bathing? N  Doing errands, shopping? Y  Comment Does not drive.  Preparing Food and eating ? N  Using the Toilet? N  In the past six months, have you accidently leaked urine? Y  Comment Prostate has been removed and pt has urine leakage daily. Wears protection daily.  Do you have problems with loss of bowel control? N  Managing your Medications? N  Managing your Finances? N  Housekeeping or managing your Housekeeping? N  Some recent data might be hidden    Patient Care Team: Bacigalupo, Dionne Bucy, MD as PCP - General (Family Medicine) Birder Robson, MD as Referring Physician (Ophthalmology) Oneta Rack, MD (Dermatology)  Indicate any recent Medical Services you may have received from other than [facility] providers in the past year (date may be approximate).     Assessment:   This is a routine wellness examination for [name].  Hearing/Vision screen No exam data present  Dietary issues and exercise activities  discussed: Current Exercise Habits: Home exercise routine, Type of exercise: walking, Time (Minutes): 45, Frequency (Times/Week): 6, Weekly Exercise (Minutes/Week): 270, Intensity: Mild, Exercise limited by: orthopedic condition(s)  Goals    . Prevent falls     Recommend to remove any items from the home that may cause slips or trips.      Depression Screen PHQ 2/9 Scores 06/12/2020 12/09/2019 06/17/2019 06/10/2019 05/17/2019 11/17/2017 09/15/2017  PHQ - 2 Score 1 - 3 3 0 1 4  PHQ- 9 Score - - 6 6 - - 10  Exception Documentation - Other- indicate reason in comment box - - - - -    Fall Risk Fall Risk  06/12/2020 06/10/2019 10/05/2018 11/17/2017 09/13/2016  Falls in the past year? 1 1 1  Yes Yes  Number falls in past yr: 0 0 1 2 or more 1  Comment - - - - slipped, reviewed by Dr Venia Minks following incident   Injury with Fall? 0 0 1 Yes Yes  Comment - - - fractured 3 bones broken rips and hurt back  Risk for fall due to : Impaired balance/gait;Impaired mobility - History of fall(s);Impaired balance/gait;Impaired mobility - -  Follow up Falls prevention discussed Falls prevention discussed Falls evaluation completed;Falls prevention discussed;Education provided Falls prevention discussed Falls prevention discussed  Comment - - - - -    Any stairs in or around the home? Yes  If so, are there any without handrails? No  Home free of loose throw rugs in walkways, pet beds, electrical cords, etc? Yes  Adequate lighting in your home to reduce risk of falls? Yes   ASSISTIVE DEVICES UTILIZED TO PREVENT FALLS:  Life alert? Yes  Use of a cane, walker or w/c? Yes  Grab bars in the bathroom? Yes  Shower chair or bench in shower? Yes  Elevated toilet seat or a handicapped toilet? No    Cognitive Function: Unable to complete.     6CIT Screen 06/10/2019 09/13/2016  What Year? 0 points 0 points  What month? 0 points 0 points  What time? 0 points 0 points  Count back from 20 0 points 0 points  Months in  reverse 0 points 0 points  Repeat phrase 6 points 0 points  Total Score 6 0    Immunizations Immunization History  Administered Date(s) Administered  . Influenza Split 05/15/2009, 05/08/2011  . Influenza, High Dose  Seasonal PF 04/13/2014, 04/24/2018  . Influenza,inj,Quad PF,6+ Mos 04/07/2013  . Moderna SARS-COVID-2 Vaccination 07/23/2019, 08/23/2019  . Pneumococcal Conjugate-13 04/13/2014  . Pneumococcal Polysaccharide-23 06/09/2012  . Td 01/19/2007, 04/11/2018  . Tdap 12/04/2010, 08/19/2016  . Zoster 04/20/2007    TDAP status: Up to date Flu Vaccine status: Up to date Pneumococcal vaccine status: Up to date Covid-19 vaccine status: Completed vaccines  Qualifies for Shingles Vaccine? Yes   Zostavax completed Yes   Shingrix Completed?: No.    Education has been provided regarding the importance of this vaccine. Patient has been advised to call insurance company to determine out of pocket expense if they have not yet received this vaccine. Advised may also receive vaccine at local pharmacy or Health Dept. Verbalized acceptance and understanding.  Screening Tests Health Maintenance  Topic Date Due  . INFLUENZA VACCINE  02/06/2020  . TETANUS/TDAP  04/11/2028  . COVID-19 Vaccine  Completed  . PNA vac Low Risk Adult  Completed    Health Maintenance  Health Maintenance Due  Topic Date Due  . INFLUENZA VACCINE  02/06/2020    Colorectal cancer screening: No longer required.   Lung Cancer Screening: (Low Dose CT Chest recommended if Age 54-80 years, 30 pack-year currently smoking OR have quit w/in 15years.) does not qualify.    Additional Screening:  Vision Screening: Recommended annual ophthalmology exams for early detection of glaucoma and other disorders of the eye. Is the patient up to date with their annual eye exam?  Yes  Who is the provider or what is the name of the office in which the patient attends annual eye exams? Dr George Ina @ [facility] If pt is not established  with a provider, would they like to be referred to a provider to establish care? No .   Dental Screening: Recommended annual dental exams for proper oral hygiene  Community Resource Referral / Chronic Care Management: CRR required this visit?  No   CCM required this visit?  No      Plan:     I have personally reviewed and noted the following in the patient's chart:   . Medical and social history . Use of alcohol, tobacco or illicit drugs  . Current medications and supplements . Functional ability and status . Nutritional status . Physical activity . Advanced directives . List of other physicians . Hospitalizations, surgeries, and ER visits in previous 12 months . Vitals . Screenings to include cognitive, depression, and falls . Referrals and appointments  In addition, I have reviewed and discussed with patient certain preventive protocols, quality metrics, and best practice recommendations. A written personalized care plan for preventive services as well as general preventive health recommendations were provided to patient.     Kavitha Lansdale [place], [place]   [id number]/09/[id number]   Nurse Notes: Pt states he received his flu shot from the pharmacy. Requested pt to check with pharmacy and let us know what date it was received.

## 2020-06-08 NOTE — Telephone Encounter (Signed)
Patient advised that x-ray and labs have been ordered. Patient will keep 06/15/20 appt.

## 2020-06-08 NOTE — Progress Notes (Signed)
Virtual telephone visit    Virtual Visit via Telephone Note   This visit type was conducted due to national recommendations for restrictions regarding the COVID-19 Pandemic (e.g. social distancing) in an effort to limit this patient's exposure and mitigate transmission in our community. Due to his co-morbid illnesses, this patient is at least at moderate risk for complications without adequate follow up. This format is felt to be most appropriate for this patient at this time. The patient did not have access to video technology or had technical difficulties with video requiring transitioning to audio format only (telephone). Physical exam was limited to content and character of the telephone converstion.    Patient location: Home Provider location: Office   I discussed the limitations of evaluation and management by telemedicine and the availability of in person appointments. The patient expressed understanding and agreed to proceed.   Visit Date: 06/07/2020  Today's healthcare provider: Trinna Post, PA-C   Chief Complaint  Patient presents with  . Fall  . Hip Pain   Subjective    HPI   Patient with history of frequent falls, HTN presents today for recent fall. Reports he fell Monday 06/05/2020 in the doorway of his house. He landed on his right hip. Did not lose consciousness, suffer head injury, experience nausea or vomiting. Reports he was able to get up and has since been ambulating with use of walker which is his baseline. Physical therapy saw him and recommended xray to assess for fracture.        Medications: Outpatient Medications Prior to Visit  Medication Sig  . amLODipine (NORVASC) 5 MG tablet Take 1 tablet (5 mg total) by mouth daily.  [name] aspirin EC 81 MG tablet Take 81 mg by mouth daily.  . Emollient (CERAVE) CREA Apply 1 application topically 2 (two) times daily as needed (itching).   [name] escitalopram (LEXAPRO) 5 MG tablet Take 1 tablet (5 mg total) by  mouth daily.  . fluticasone (FLONASE) 50 MCG/ACT nasal spray Place 1 spray into both nostrils daily.  . hydrochlorothiazide (MICROZIDE) 12.5 MG capsule Take 1 capsule (12.5 mg total) by mouth daily.  . Multiple Vitamin (MULTI-VITAMIN) tablet Take 1 tablet by mouth daily.   . naproxen sodium (ALEVE) 220 MG tablet Take 220 mg by mouth daily as needed (pain/headache).  Vladimir Faster Glycol-Propyl Glycol (SYSTANE OP) Place 1 drop into both eyes daily.   . polyethylene glycol powder (GLYCOLAX/MIRALAX) powder Take 17 g by mouth daily.  . tamsulosin (FLOMAX) 0.4 MG CAPS capsule TAKE ONE CAPSULE BY MOUTH ONCE DAILY  . triamcinolone ointment (KENALOG) 0.5 % Apply 1 application topically 2 (two) times daily.  . Zinc 50 MG TABS Take 50 mg by mouth daily.    No facility-administered medications prior to visit.    Review of Systems    Objective    There were no vitals taken for this visit.     Assessment & Plan    1. Right hip pain  Have placed order for right hip xray. Patient is independent living at [facility] so he will have to travel to outpatient facility. Have also ordered his labwork for his upcoming OV.     No follow-ups on file.    I discussed the assessment and treatment plan with the patient. The patient was provided an opportunity to ask questions and all were answered. The patient agreed with the plan and demonstrated an understanding of the instructions.   The patient was advised to call  back or seek an in-person evaluation if the symptoms worsen or if the condition fails to improve as anticipated.  I provided 21 minutes of non-face-to-face time during this encounter.  ITrinna Post, PA-C, have reviewed all documentation for this visit. The documentation on 06/08/20 for the exam, diagnosis, procedures, and orders are all accurate and complete.  The entirety of the information documented in the History of Present Illness, Review of Systems and Physical Exam were  personally obtained by me. Portions of this information were initially documented by [facility] and reviewed by me for thoroughness and accuracy.    Paulene Floor [facility] [phone] (phone) [phone] (fax)  [place]

## 2020-06-09 ENCOUNTER — Telehealth: Payer: Self-pay | Admitting: Family Medicine

## 2020-06-09 ENCOUNTER — Ambulatory Visit: Payer: Medicare Other | Admitting: Family Medicine

## 2020-06-09 ENCOUNTER — Other Ambulatory Visit: Payer: Self-pay

## 2020-06-09 ENCOUNTER — Ambulatory Visit
Admission: RE | Admit: 2020-06-09 | Discharge: 2020-06-09 | Disposition: A | Discharge status: Home / Self Care | Payer: Medicare Other | Source: Ambulatory Visit | Attending: Physician Assistant | Admitting: Physician Assistant

## 2020-06-09 ENCOUNTER — Ambulatory Visit
Admission: RE | Admit: 2020-06-09 | Discharge: 2020-06-09 | Disposition: A | Discharge status: Home / Self Care | Payer: Medicare Other | Attending: Physician Assistant | Admitting: Physician Assistant

## 2020-06-09 DIAGNOSIS — R7303 Prediabetes: Secondary | ICD-10-CM | POA: Diagnosis not present

## 2020-06-09 DIAGNOSIS — Y939 Activity, unspecified: Secondary | ICD-10-CM | POA: Insufficient documentation

## 2020-06-09 DIAGNOSIS — Y929 Unspecified place or not applicable: Secondary | ICD-10-CM | POA: Diagnosis not present

## 2020-06-09 DIAGNOSIS — W19XXXA Unspecified fall, initial encounter: Secondary | ICD-10-CM | POA: Diagnosis not present

## 2020-06-09 DIAGNOSIS — M25552 Pain in left hip: Secondary | ICD-10-CM

## 2020-06-09 DIAGNOSIS — I10 Essential (primary) hypertension: Secondary | ICD-10-CM | POA: Diagnosis not present

## 2020-06-09 NOTE — Telephone Encounter (Signed)
Patient is calling to receive the x-ray and lab results. Please advise [phone]

## 2020-06-10 LAB — CBC WITH DIFFERENTIAL/PLATELET
Basophils Absolute: 0.1 10*3/uL (ref 0.0–0.2)
Basos: 2 %
EOS (ABSOLUTE): 0.1 10*3/uL (ref 0.0–0.4)
Eos: 3 %
Hematocrit: 40.1 % (ref 37.5–51.0)
Hemoglobin: 13.4 g/dL (ref 13.0–17.7)
Immature Grans (Abs): 0 10*3/uL (ref 0.0–0.1)
Immature Granulocytes: 1 %
Lymphocytes Absolute: 0.7 10*3/uL (ref 0.7–3.1)
Lymphs: 14 %
MCH: 30.5 pg (ref 26.6–33.0)
MCHC: 33.4 g/dL (ref 31.5–35.7)
MCV: 91 fL (ref 79–97)
Monocytes Absolute: 0.6 10*3/uL (ref 0.1–0.9)
Monocytes: 14 %
Neutrophils Absolute: 3.1 10*3/uL (ref 1.4–7.0)
Neutrophils: 66 %
Platelets: 190 10*3/uL (ref 150–450)
RBC: 4.39 x10E6/uL (ref 4.14–5.80)
RDW: 12.9 % (ref 11.6–15.4)
WBC: 4.6 10*3/uL (ref 3.4–10.8)

## 2020-06-10 LAB — COMPREHENSIVE METABOLIC PANEL
ALT: 21 IU/L (ref 0–44)
AST: 32 IU/L (ref 0–40)
Albumin/Globulin Ratio: 2 (ref 1.2–2.2)
Albumin: 4.3 g/dL (ref 3.5–4.6)
Alkaline Phosphatase: 69 IU/L (ref 44–121)
BUN/Creatinine Ratio: 26 — ABNORMAL HIGH (ref 10–24)
BUN: 24 mg/dL (ref 10–36)
Bilirubin Total: 0.8 mg/dL (ref 0.0–1.2)
CO2: 23 mmol/L (ref 20–29)
Calcium: 10.1 mg/dL (ref 8.6–10.2)
Chloride: 98 mmol/L (ref 96–106)
Creatinine, Ser: 0.94 mg/dL (ref 0.76–1.27)
GFR calc Af Amer: 81 mL/min/{1.73_m2} (ref >59)
GFR calc non Af Amer: 70 mL/min/{1.73_m2} (ref >59)
Globulin, Total: 2.2 g/dL (ref 1.5–4.5)
Glucose: 105 mg/dL — ABNORMAL HIGH (ref 65–99)
Potassium: 4.7 mmol/L (ref 3.5–5.2)
Sodium: 136 mmol/L (ref 134–144)
Total Protein: 6.5 g/dL (ref 6.0–8.5)

## 2020-06-10 LAB — TSH: TSH: 2.64 u[IU]/mL (ref 0.450–4.500)

## 2020-06-10 LAB — LIPID PANEL
Chol/HDL Ratio: 2.4 ratio (ref 0.0–5.0)
Cholesterol, Total: 176 mg/dL (ref 100–199)
HDL: 73 mg/dL (ref >39)
LDL Chol Calc (NIH): 91 mg/dL (ref 0–99)
Triglycerides: 65 mg/dL (ref 0–149)
VLDL Cholesterol Cal: 12 mg/dL (ref 5–40)

## 2020-06-10 LAB — HEMOGLOBIN A1C
Est. average glucose Bld gHb Est-mCnc: 114 mg/dL
Hgb A1c MFr Bld: 5.6 % (ref 4.8–5.6)

## 2020-06-12 ENCOUNTER — Other Ambulatory Visit: Payer: Self-pay

## 2020-06-12 ENCOUNTER — Ambulatory Visit (INDEPENDENT_AMBULATORY_CARE_PROVIDER_SITE_OTHER): Payer: Medicare Other

## 2020-06-12 DIAGNOSIS — Z0000 Encounter for general adult medical examination without abnormal findings: Secondary | ICD-10-CM | POA: Diagnosis not present

## 2020-06-12 NOTE — Patient Instructions (Signed)
[name] , Thank you for taking time to come for your Medicare Wellness Visit. I appreciate your ongoing commitment to your health goals. Please review the following plan we discussed and let me know if I can assist you in the future.   Screening recommendations/referrals: Colonoscopy: No longer required.  Recommended yearly ophthalmology/optometry visit for glaucoma screening and checkup Recommended yearly dental visit for hygiene and checkup  Vaccinations: Influenza vaccine: Completed at pharmacy. Requested dates received.  Pneumococcal vaccine: Completed series Tdap vaccine: Up to date, due 04/2028 Shingles vaccine: Shingrix discussed. Please contact your pharmacy for coverage information.     Advanced directives: Please bring a copy of your POA (Power of Attorney) and/or Living Will to your next appointment.   Conditions/risks identified: Fall risk prevention discussed today.   Next appointment: 06/15/20 @ 3:00 PM with Dr Brita Romp   Preventive Care 34 Years and Older, Male Preventive care refers to lifestyle choices and visits with your health care provider that can promote health and wellness. What does preventive care include?  A yearly physical exam. This is also called an annual well check.  Dental exams once or twice a year.  Routine eye exams. Ask your health care provider how often you should have your eyes checked.  Personal lifestyle choices, including:  Daily care of your teeth and gums.  Regular physical activity.  Eating a healthy diet.  Avoiding tobacco and drug use.  Limiting alcohol use.  Practicing safe sex.  Taking low doses of aspirin every day.  Taking vitamin and mineral supplements as recommended by your health care provider. What happens during an annual well check? The services and screenings done by your health care provider during your annual well check will depend on your age, overall health, lifestyle risk factors, and family history  of disease. Counseling  Your health care provider may ask you questions about your:  Alcohol use.  Tobacco use.  Drug use.  Emotional well-being.  Home and relationship well-being.  Sexual activity.  Eating habits.  History of falls.  Memory and ability to understand (cognition).  Work and work Statistician. Screening  You may have the following tests or measurements:  Height, weight, and BMI.  Blood pressure.  Lipid and cholesterol levels. These may be checked every 5 years, or more frequently if you are over 10 years old.  Skin check.  Lung cancer screening. You may have this screening every year starting at age 76 if you have a 30-pack-year history of smoking and currently smoke or have quit within the past 15 years.  Fecal occult blood test (FOBT) of the stool. You may have this test every year starting at age 76.  Flexible sigmoidoscopy or colonoscopy. You may have a sigmoidoscopy every 5 years or a colonoscopy every 10 years starting at age 28.  Prostate cancer screening. Recommendations will vary depending on your family history and other risks.  Hepatitis C blood test.  Hepatitis B blood test.  Sexually transmitted disease (STD) testing.  Diabetes screening. This is done by checking your blood sugar (glucose) after you have not eaten for a while (fasting). You may have this done every 1-3 years.  Abdominal aortic aneurysm (AAA) screening. You may need this if you are a current or former smoker.  Osteoporosis. You may be screened starting at age 1 if you are at high risk. Talk with your health care provider about your test results, treatment options, and if necessary, the need for more tests. Vaccines  Your health  care provider may recommend certain vaccines, such as:  Influenza vaccine. This is recommended every year.  Tetanus, diphtheria, and acellular pertussis (Tdap, Td) vaccine. You may need a Td booster every 10 years.  Zoster vaccine. You may  need this after age 8.  Pneumococcal 13-valent conjugate (PCV13) vaccine. One dose is recommended after age 66.  Pneumococcal polysaccharide (PPSV23) vaccine. One dose is recommended after age 40. Talk to your health care provider about which screenings and vaccines you need and how often you need them. This information is not intended to replace advice given to you by your health care provider. Make sure you discuss any questions you have with your health care provider. Document Released: 07/21/2015 Document Revised: 03/13/2016 Document Reviewed: 04/25/2015 Elsevier Interactive Patient Education  2017 [place] Prevention in the Home Falls can cause injuries. They can happen to people of all ages. There are many things you can do to make your home safe and to help prevent falls. What can I do on the outside of my home?  Regularly fix the edges of walkways and driveways and fix any cracks.  Remove anything that might make you trip as you walk through a door, such as a raised step or threshold.  Trim any bushes or trees on the path to your home.  Use bright outdoor lighting.  Clear any walking paths of anything that might make someone trip, such as rocks or tools.  Regularly check to see if handrails are loose or broken. Make sure that both sides of any steps have handrails.  Any raised decks and porches should have guardrails on the edges.  Have any leaves, snow, or ice cleared regularly.  Use sand or salt on walking paths during winter.  Clean up any spills in your garage right away. This includes oil or grease spills. What can I do in the bathroom?  Use night lights.  Install grab bars by the toilet and in the tub and shower. Do not use towel bars as grab bars.  Use non-skid mats or decals in the tub or shower.  If you need to sit down in the shower, use a plastic, non-slip stool.  Keep the floor dry. Clean up any water that spills on the floor as soon as it  happens.  Remove soap buildup in the tub or shower regularly.  Attach bath mats securely with double-sided non-slip rug tape.  Do not have throw rugs and other things on the floor that can make you trip. What can I do in the bedroom?  Use night lights.  Make sure that you have a light by your bed that is easy to reach.  Do not use any sheets or blankets that are too big for your bed. They should not hang down onto the floor.  Have a firm chair that has side arms. You can use this for support while you get dressed.  Do not have throw rugs and other things on the floor that can make you trip. What can I do in the kitchen?  Clean up any spills right away.  Avoid walking on wet floors.  Keep items that you use a lot in easy-to-reach places.  If you need to reach something above you, use a strong step stool that has a grab bar.  Keep electrical cords out of the way.  Do not use floor polish or wax that makes floors slippery. If you must use wax, use non-skid floor wax.  Do not have  throw rugs and other things on the floor that can make you trip. What can I do with my stairs?  Do not leave any items on the stairs.  Make sure that there are handrails on both sides of the stairs and use them. Fix handrails that are broken or loose. Make sure that handrails are as long as the stairways.  Check any carpeting to make sure that it is firmly attached to the stairs. Fix any carpet that is loose or worn.  Avoid having throw rugs at the top or bottom of the stairs. If you do have throw rugs, attach them to the floor with carpet tape.  Make sure that you have a light switch at the top of the stairs and the bottom of the stairs. If you do not have them, ask someone to add them for you. What else can I do to help prevent falls?  Wear shoes that:  Do not have high heels.  Have rubber bottoms.  Are comfortable and fit you well.  Are closed at the toe. Do not wear sandals.  If you  use a stepladder:  Make sure that it is fully opened. Do not climb a closed stepladder.  Make sure that both sides of the stepladder are locked into place.  Ask someone to hold it for you, if possible.  Clearly mark and make sure that you can see:  Any grab bars or handrails.  First and last steps.  Where the edge of each step is.  Use tools that help you move around (mobility aids) if they are needed. These include:  Canes.  Walkers.  Scooters.  Crutches.  Turn on the lights when you go into a dark area. Replace any light bulbs as soon as they burn out.  Set up your furniture so you have a clear path. Avoid moving your furniture around.  If any of your floors are uneven, fix them.  If there are any pets around you, be aware of where they are.  Review your medicines with your doctor. Some medicines can make you feel dizzy. This can increase your chance of falling. Ask your doctor what other things that you can do to help prevent falls. This information is not intended to replace advice given to you by your health care provider. Make sure you discuss any questions you have with your health care provider. Document Released: 04/20/2009 Document Revised: 11/30/2015 Document Reviewed: 07/29/2014 Elsevier Interactive Patient Education  2017 [employer].

## 2020-06-12 NOTE — Telephone Encounter (Signed)
See result notes. 

## 2020-06-14 NOTE — Telephone Encounter (Signed)
Left message to call back  

## 2020-06-15 ENCOUNTER — Ambulatory Visit (INDEPENDENT_AMBULATORY_CARE_PROVIDER_SITE_OTHER): Payer: Medicare Other | Admitting: Family Medicine

## 2020-06-15 ENCOUNTER — Encounter: Payer: Self-pay | Admitting: Family Medicine

## 2020-06-15 ENCOUNTER — Other Ambulatory Visit: Payer: Self-pay

## 2020-06-15 ENCOUNTER — Ambulatory Visit
Admission: RE | Admit: 2020-06-15 | Discharge: 2020-06-15 | Disposition: A | Discharge status: Home / Self Care | Payer: Medicare Other | Source: Ambulatory Visit | Attending: Family Medicine | Admitting: Family Medicine

## 2020-06-15 VITALS — BP 133/64 | HR 78 | Temp 98.2°F | Resp 16 | Wt 156.0 lb

## 2020-06-15 DIAGNOSIS — F3342 Major depressive disorder, recurrent, in full remission: Secondary | ICD-10-CM | POA: Insufficient documentation

## 2020-06-15 DIAGNOSIS — I10 Essential (primary) hypertension: Secondary | ICD-10-CM | POA: Diagnosis not present

## 2020-06-15 DIAGNOSIS — R413 Other amnesia: Secondary | ICD-10-CM

## 2020-06-15 DIAGNOSIS — R011 Cardiac murmur, unspecified: Secondary | ICD-10-CM

## 2020-06-15 DIAGNOSIS — R7303 Prediabetes: Secondary | ICD-10-CM

## 2020-06-15 DIAGNOSIS — Z0000 Encounter for general adult medical examination without abnormal findings: Secondary | ICD-10-CM | POA: Insufficient documentation

## 2020-06-15 DIAGNOSIS — M7989 Other specified soft tissue disorders: Secondary | ICD-10-CM

## 2020-06-15 DIAGNOSIS — S8012XA Contusion of left lower leg, initial encounter: Secondary | ICD-10-CM | POA: Diagnosis not present

## 2020-06-15 MED ORDER — FLUTICASONE PROPIONATE 50 MCG/ACT NA SUSP
1.0000 | Freq: Every day | NASAL | 5 refills | Status: DC
Start: 2020-06-15 — End: 2020-11-27

## 2020-06-15 NOTE — Assessment & Plan Note (Signed)
New, unreported in previous visits II/IV holosystolic blowing murmur heard loudest at the apex Most consistent with mitral regurgitation Refer for Echocardiogram Consider cardiology referral depending on severity of murmur

## 2020-06-15 NOTE — Assessment & Plan Note (Signed)
A1c of 5.6  Resolving Continue healthy diet

## 2020-06-15 NOTE — Assessment & Plan Note (Signed)
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

## 2020-06-15 NOTE — Patient Instructions (Signed)

## 2020-06-15 NOTE — Assessment & Plan Note (Signed)
Previously well controlled continue amlodipine 5mg  and HCTZ 12.5 mg Recent CMP F/u in 6 months

## 2020-06-15 NOTE — Assessment & Plan Note (Signed)
UTD on vaccines A1c 5.6 BP well controlled Recent labs wnl

## 2020-06-15 NOTE — Progress Notes (Addendum)
Established Patient   Patient: [name]   DOB: Oct 18, 1924   86 y.o. Male  MRN: [id number] Visit Date: 06/15/2020  Today's healthcare provider: Lavon Paganini, MD   Chief Complaint  Patient presents with  . Annual Wellness Visit   Subjective    [name] is a 86 y.o. male who presents today for a f/u to AWV.  He reports consuming a low sodium diet. Home exercise routine includes walking 1 hrs per day. He generally feels fairly well. He reports sleeping fairly well. He does have additional problems to discuss today.  HPI   [name] is concerned about his memory, he has had trouble remembering the name of an old friend who recently died and the word for avocado, despite living in [place] for many years in the 38s, sometimes when he is reading something complicated he has to reread it to understand it. He laments that he is short with his wife. He tries to walk a mile every day but is frustrated with how long it takes him. He does not report depressed feelings or changes in diet or sleep.    Past Medical History:  Diagnosis Date  . Arthritis   . Bladder outlet obstruction   . Erectile dysfunction   . GERD (gastroesophageal reflux disease)   . Hard of hearing   . HBP (high blood pressure)   . History of nephrolithiasis   . HLD (hyperlipidemia)   . Incomplete bladder emptying   . Nocturia   . Osteoporosis   . Prostate cancer ([place])   . Shingles 2014  . Sinus problem   . Stomach ulcer   . Stroke ([place]) 2012  . Urinary frequency   . Urinary leakage    Past Surgical History:  Procedure Laterality Date  . CATARACT EXTRACTION  2000, 2001  . CHOLECYSTECTOMY  1972  . PROSTATE SURGERY  2008/2009  . TONSILLECTOMY    . UMBILICAL HERNIA REPAIR     Social History   Socioeconomic History  . Marital status: Married    Spouse name: Not on file  . Number of children: 2  . Years of education: Not on file  . Highest education level: Master's degree (e.g., MA, MS,  MEng, MEd, MSW, MBA)  Occupational History  . Occupation: retired  Tobacco Use  . Smoking status: Former Smoker    Years: 10.00    Quit date: 07/08/1962    Years since quitting: 57.9  . Smokeless tobacco: Never Used  . Tobacco comment: quit 1964  Vaping Use  . Vaping Use: Never used  Substance and Sexual Activity  . Alcohol use: Yes    Alcohol/week: 0.0 - 1.0 standard drinks  . Drug use: No  . Sexual activity: Not on file  Other Topics Concern  . Not on file  Social History Narrative  . Not on file   Social Determinants of Health   Financial Resource Strain: Low Risk   . Difficulty of Paying Living Expenses: Not hard at all  Food Insecurity: No Food Insecurity  . Worried About Charity fundraiser in the Last Year: Never true  . Ran Out of Food in the Last Year: Never true  Transportation Needs: No Transportation Needs  . Lack of Transportation (Medical): No  . Lack of Transportation (Non-Medical): No  Physical Activity: Sufficiently Active  . Days of Exercise per Week: 6 days  . Minutes of Exercise per Session: 40 min  Stress: No Stress Concern Present  .  Feeling of Stress : Not at all  Social Connections: Moderately Isolated  . Frequency of Communication with Friends and Family: Once a week  . Frequency of Social Gatherings with Friends and Family: More than three times a week  . Attends Religious Services: Never  . Active Member of Clubs or Organizations: No  . Attends Archivist Meetings: Never  . Marital Status: Married  Human resources officer Violence: Not At Risk  . Fear of Current or Ex-Partner: No  . Emotionally Abused: No  . Physically Abused: No  . Sexually Abused: No   Family Status  Relation Name Status  . Mother  Deceased  . Father  Deceased  . Sister  (Not Specified)  . Brother  (Not Specified)  . Neg Hx  (Not Specified)   Family History  Problem Relation Age of Onset  . Cancer Mother   . Stroke Father   . Stroke Sister   . Prostate  cancer Brother   . Kidney disease Neg Hx   . Kidney cancer Neg Hx   . Bladder Cancer Neg Hx    Allergies  Allergen Reactions  . Toviaz [Fesoterodine Fumarate Er] Other (See Comments)    Acid reflux    Patient Care Team: [name], MD as PCP - General (Family Medicine) Birder Robson, MD as Referring Physician (Ophthalmology) Oneta Rack, MD (Dermatology)   Medications: Outpatient Medications Prior to Visit  Medication Sig  . amLODipine (NORVASC) 5 MG tablet Take 1 tablet (5 mg total) by mouth daily.  [name] aspirin EC 81 MG tablet Take 81 mg by mouth daily.  . [DISCONTINUED] fluticasone (FLONASE) 50 MCG/ACT nasal spray Place 1 spray into both nostrils daily.  . Emollient (CERAVE) CREA Apply 1 application topically 2 (two) times daily as needed (itching).  [name] escitalopram (LEXAPRO) 5 MG tablet Take 1 tablet (5 mg total) by mouth daily.  . hydrochlorothiazide (MICROZIDE) 12.5 MG capsule Take 1 capsule (12.5 mg total) by mouth daily.  . Multiple Vitamin (MULTI-VITAMIN) tablet Take 1 tablet by mouth daily.   . naproxen sodium (ALEVE) 220 MG tablet Take 220 mg by mouth daily as needed (pain/headache).  Vladimir Faster Glycol-Propyl Glycol (SYSTANE OP) Place 1 drop into both eyes daily.   . polyethylene glycol powder (GLYCOLAX/MIRALAX) powder Take 17 g by mouth daily.  . tamsulosin (FLOMAX) 0.4 MG CAPS capsule TAKE ONE CAPSULE BY MOUTH ONCE DAILY  . triamcinolone ointment (KENALOG) 0.5 % Apply 1 application topically 2 (two) times daily.  . Zinc 50 MG TABS Take 50 mg by mouth daily.   No facility-administered medications prior to visit.    Review of Systems  Allergic/Immunologic: Positive for environmental allergies.  Psychiatric/Behavioral: Positive for agitation.  All other systems reviewed and are negative.     Objective    BP 133/64   Pulse 78   Temp 98.2 F (36.8 C)   Resp 16   Wt 156 lb (70.8 kg)   BMI 23.04 kg/m    Physical Exam   General: Well  appearing, in NAD Pulm: Lungs CTA bilaterally Cards: RRR, II/IV holosystolic blowing murmur heard loudest at the apex.  Derm: flaking and scaling on lower extremities bilaterally Extremities: LLE popliteal swelling and bruising, trace edema bilaterally, left calf seems slightly larger than right calf and is tender to palpation.  Negative Homans' sign. Abdomen: Soft non-tender Psych  Mood: 'good'  Affect: pleasant, mood congruent  Conversation: clear and coherent, anxious when discussing memory  Thought process: Logical and linear,  occasional pauses for memory'  Thought content: No SI or A/V/H    Last depression screening scores PHQ 2/9 Scores 06/12/2020 12/09/2019 06/17/2019  PHQ - 2 Score 1 - 3  PHQ- 9 Score - - 6  Exception Documentation - Other- indicate reason in comment box -   Last fall risk screening Fall Risk  06/12/2020  Falls in the past year? 1  Number falls in past yr: 0  Comment -  Injury with Fall? 0  Comment -  Risk for fall due to : Impaired balance/gait;Impaired mobility  Follow up Falls prevention discussed  Comment -   Last Audit-C alcohol use screening Alcohol Use Disorder Test (AUDIT) 06/12/2020  1. How often do you have a drink containing alcohol? 1  2. How many drinks containing alcohol do you have on a typical day when you are drinking? 0  3. How often do you have six or more drinks on one occasion? 0  AUDIT-C Score 1  Alcohol Brief Interventions/Follow-up AUDIT Score <7 follow-up not indicated   A score of 3 or more in women, and 4 or more in men indicates increased risk for alcohol abuse, EXCEPT if all of the points are from question 1   No results found for any visits on 06/15/20.  Assessment & Plan    F/u to AWV  Exercise Activities and Dietary recommendations Goals    . Prevent falls     Recommend to remove any items from the home that may cause slips or trips.       Immunization History  Administered Date(s) Administered  . Influenza  Split 05/15/2009, 05/08/2011  . Influenza, High Dose Seasonal PF 04/13/2014, 04/24/2018  . Influenza,inj,Quad PF,6+ Mos 04/07/2013  . Moderna Sars-Covid-2 Vaccination 07/23/2019, 08/23/2019, 05/10/2020  . Pneumococcal Conjugate-13 04/13/2014  . Pneumococcal Polysaccharide-23 06/09/2012  . Td 01/19/2007, 04/11/2018  . Tdap 12/04/2010, 08/19/2016  . Zoster 04/20/2007    Health Maintenance  Topic Date Due  . INFLUENZA VACCINE  02/06/2020  . COVID-19 Vaccine (4 - Booster for Moderna series) 11/07/2020  . TETANUS/TDAP  04/11/2028  . PNA vac Low Risk Adult  Completed    Discussed health benefits of physical activity, and encouraged him to engage in regular exercise appropriate for his age and condition.  Problem List Items Addressed This Visit      Cardiovascular and Mediastinum   Benign essential HTN    Previously well controlled continue amlodipine 5mg  and HCTZ 12.5 mg Recent CMP F/u in 6 months        Other   Prediabetes    A1c of 5.6  Resolving Continue healthy diet      Memory difficulties    Recurrent problem with episodic relapses in memory of people or events Likely related to normal cognitive aging Has anxiety about his memory loss, is concerned of developing senility F/u in 3 months, MMSE at that time      Murmur, cardiac - Primary    New, unreported in previous visits II/IV holosystolic blowing murmur heard loudest at the apex Most consistent with mitral regurgitation Refer for Echocardiogram Consider cardiology referral depending on severity of murmur      Relevant Orders   ECHOCARDIOGRAM COMPLETE   Recurrent major depressive disorder, in full remission (HCC)   Calf swelling    Swelling and bruising appreciated behind right knee after recent fall Concern for bakers cyst rupture vs DVT We will send for venous ultrasound to rule out DVT      Relevant Orders  US Venous Img Lower Unilateral Left (Completed)       Return in about 3 months (around  09/13/2020) for chronic disease f/u.     Total time spent on today's visit was greater than 40 minutes, including both face-to-face time and nonface-to-face time personally spent on review of chart (labs and imaging), discussing labs and goals, discussing further work-up, treatment options, referrals to specialist if needed, reviewing outside records of pertinent, answering patient's questions, and coordinating care.   Patient seen along with MS3 student [facility]. I personally evaluated this patient along with the student, and verified all aspects of the history, physical exam, and medical decision making as documented by the student. I agree with the student's documentation and have made all necessary edits.  Decarla Siemen, Dionne Bucy, MD, MPH [facility]

## 2020-06-15 NOTE — Assessment & Plan Note (Addendum)
Swelling and bruising appreciated behind right knee after recent fall Concern for bakers cyst rupture vs DVT We will send for venous ultrasound to rule out DVT

## 2020-06-16 ENCOUNTER — Telehealth: Payer: Self-pay

## 2020-06-16 NOTE — Telephone Encounter (Signed)
Copied from [place] [id number]. Topic: General - Other >> Jun 16, 2020  2:10 PM Keene Breath wrote: Reason for CRM: Patient's wife is calling to see if the doctor made an appt. For patient to have an Eco test.  Patient's wife said they did not have any information.  Please call to discuss at [phone] (

## 2020-06-19 ENCOUNTER — Ambulatory Visit (INDEPENDENT_AMBULATORY_CARE_PROVIDER_SITE_OTHER): Payer: Medicare Other | Admitting: Podiatry

## 2020-06-19 ENCOUNTER — Other Ambulatory Visit: Payer: Self-pay

## 2020-06-19 ENCOUNTER — Encounter: Payer: Self-pay | Admitting: Podiatry

## 2020-06-19 DIAGNOSIS — M79676 Pain in unspecified toe(s): Secondary | ICD-10-CM | POA: Diagnosis not present

## 2020-06-19 DIAGNOSIS — Q828 Other specified congenital malformations of skin: Secondary | ICD-10-CM

## 2020-06-19 DIAGNOSIS — B351 Tinea unguium: Secondary | ICD-10-CM | POA: Diagnosis not present

## 2020-06-19 DIAGNOSIS — L989 Disorder of the skin and subcutaneous tissue, unspecified: Secondary | ICD-10-CM

## 2020-06-19 NOTE — Telephone Encounter (Signed)
Echo order was placed, but has to be scheduled. They should receive a call some time this week.

## 2020-06-19 NOTE — Telephone Encounter (Signed)
I asked in the order for Dr Donivan Scull group to read the Echo.

## 2020-06-19 NOTE — Addendum Note (Signed)
Addended by: [name] on: 06/19/2020 04:49 PM   Modules accepted: Level of Service

## 2020-06-19 NOTE — Telephone Encounter (Signed)
Mrs. Rainey advised as below.

## 2020-06-19 NOTE — Progress Notes (Signed)
This patient returns to the office for evaluation and treatment of long thick painful nails .  This patient is unable to trim his own nails since the patient cannot reach his feet.  Patient says the nails are painful walking and wearing his shoes. Patient also has painful callus on his left forefoot.   He returns for preventive foot care services. Patient presents to the office with his wife.  General Appearance  Alert, conversant and in no acute stress.  Vascular  Dorsalis pedis and posterior tibial  pulses are palpable  bilaterally.  Capillary return is within normal limits  bilaterally. Temperature is within normal limits  bilaterally.  Neurologic  Senn-Weinstein monofilament wire test within normal limits  bilaterally. Muscle power within normal limits bilaterally.  Nails Thick disfigured discolored nails with subungual debris  from hallux to fifth toes bilaterally. No evidence of bacterial infection or drainage bilaterally.  Orthopedic  No limitations of motion  feet .  No crepitus or effusions noted.  No bony pathology or digital deformities noted.  HAV 1st MPJ  Left foot.  Hammer toe second left foot.  Skin  normotropic skin  noted bilaterally.  No signs of infections or ulcers noted.   Porokeratosis left foot.  Onychomycosis  Pain in toes right foot  Pain in toes left foot  Debride porokeratosis left forefoot.  Debridement  of nails  1-5  B/L with a nail nipper.  Nails were then filed using a dremel tool with no incidents.  Debridement of callus left forefoot with # 15 blade.    RTC 3 months.  Gardiner Barefoot DPM

## 2020-06-19 NOTE — Telephone Encounter (Addendum)
Pt wife is aware echo was placed and that someone will call to schedule. Pt wife would like to know if dr Rockey Situ her cardiologist could do echo. I mention to wife maybe depending on the results of echo if he needs to see cardiologist

## 2020-06-21 ENCOUNTER — Ambulatory Visit (INDEPENDENT_AMBULATORY_CARE_PROVIDER_SITE_OTHER): Payer: Medicare Other

## 2020-06-21 ENCOUNTER — Other Ambulatory Visit: Payer: Self-pay

## 2020-06-21 DIAGNOSIS — R011 Cardiac murmur, unspecified: Secondary | ICD-10-CM

## 2020-06-21 LAB — ECHOCARDIOGRAM COMPLETE
AR max vel: 2.94 cm2
AV Area VTI: 2.9 cm2
AV Area mean vel: 2.75 cm2
AV Mean grad: 2 mmHg
AV Peak grad: 3.5 mmHg
Ao pk vel: 0.93 m/s
Area-P 1/2: 5.2 cm2
Calc EF: 51.6 %
S' Lateral: 3.2 cm
Single Plane A2C EF: 50.9 %
Single Plane A4C EF: 53 %

## 2020-06-22 ENCOUNTER — Telehealth: Payer: Self-pay | Admitting: Cardiology

## 2020-06-22 DIAGNOSIS — H903 Sensorineural hearing loss, bilateral: Secondary | ICD-10-CM | POA: Diagnosis not present

## 2020-06-22 NOTE — Telephone Encounter (Signed)
Patients daughter calling in to check in on echo results and wanting to schedule an appointment   Patient has been scheduled for new pt appointment 12/17 with Dr. Mylo Red

## 2020-06-23 ENCOUNTER — Ambulatory Visit (INDEPENDENT_AMBULATORY_CARE_PROVIDER_SITE_OTHER): Payer: Medicare Other | Admitting: Cardiology

## 2020-06-23 ENCOUNTER — Other Ambulatory Visit: Payer: Self-pay

## 2020-06-23 ENCOUNTER — Encounter: Payer: Self-pay | Admitting: Cardiology

## 2020-06-23 VITALS — BP 120/60 | HR 77 | Ht 69.0 in | Wt 157.0 lb

## 2020-06-23 DIAGNOSIS — I10 Essential (primary) hypertension: Secondary | ICD-10-CM

## 2020-06-23 DIAGNOSIS — I340 Nonrheumatic mitral (valve) insufficiency: Secondary | ICD-10-CM

## 2020-06-23 DIAGNOSIS — I350 Nonrheumatic aortic (valve) stenosis: Secondary | ICD-10-CM | POA: Diagnosis not present

## 2020-06-23 MED ORDER — TORSEMIDE 20 MG PO TABS: 20.0000 mg | ORAL_TABLET | Freq: Every day | ORAL | 3 refills | Status: DC

## 2020-06-23 NOTE — Telephone Encounter (Signed)
Patient seen by Dr. Garen Lah today in clinic.

## 2020-06-23 NOTE — Progress Notes (Signed)
Cardiology Office Note:    Date:  06/23/2020   ID:  [name], DOB [birth year], MRN [id number]  PCP:  Jesus Crews, MD  [place] Cardiologist:  No primary care provider on file.  [facility] Electrophysiologist:  None   Referring MD: Jesus Crews, MD   Chief Complaint  Patient presents with  . New Patient (Initial Visit)    Self referral - Recent ECHO showed Heart Murmur. Patient c.o swelling in ankles. Meds reviewed verbally with patient.     History of Present Illness:    [name] is a 86 y.o. male with a hx of hypertension, CVA 9 years ago, cardiac murmur who presents due to abnormal echocardiogram.  Patient was seen by primary physician where exam revealed a cardiac murmur.  He was then referred for echocardiogram  Echocardiogram was obtained on 06/21/2020.  Echo showed normal ejection fraction, EF 60 to 65%, mild to moderate aortic valve stenosis, moderate to severe MR also noted with an eccentric jet.  Patient states having swelling in his legs, also has some shortness of breath with exertion.  He walks with a walker after he had a stroke 9 years ago.  Denies chest pain.  Past Medical History:  Diagnosis Date  . Arthritis   . Bladder outlet obstruction   . Erectile dysfunction   . GERD (gastroesophageal reflux disease)   . Hard of hearing   . HBP (high blood pressure)   . History of nephrolithiasis   . HLD (hyperlipidemia)   . Incomplete bladder emptying   . Nocturia   . Osteoporosis   . Prostate cancer ([place])   . Shingles 2014  . Sinus problem   . Stomach ulcer   . Stroke ([place]) 2012  . Urinary frequency   . Urinary leakage     Past Surgical History:  Procedure Laterality Date  . CATARACT EXTRACTION  2000, 2001  . CHOLECYSTECTOMY  1972  . PROSTATE SURGERY  2008/2009  . TONSILLECTOMY    . UMBILICAL HERNIA REPAIR      Current Medications: Current Meds  Medication Sig  . amLODipine (NORVASC) 5 MG tablet Take 1  tablet (5 mg total) by mouth daily.  [name] aspirin EC 81 MG tablet Take 81 mg by mouth daily.  . fluticasone (FLONASE) 50 MCG/ACT nasal spray Place 1 spray into both nostrils daily.  . Multiple Vitamin (MULTI-VITAMIN) tablet Take 1 tablet by mouth daily.   . naproxen sodium (ALEVE) 220 MG tablet Take 220 mg by mouth daily as needed (pain/headache).  Vladimir Faster Glycol-Propyl Glycol (SYSTANE OP) Place 1 drop into both eyes daily.   . Zinc 50 MG TABS Take 50 mg by mouth daily.  . [DISCONTINUED] hydrochlorothiazide (MICROZIDE) 12.5 MG capsule Take 1 capsule (12.5 mg total) by mouth daily.     Allergies:   Toviaz [fesoterodine fumarate er]   Social History   Socioeconomic History  . Marital status: Married    Spouse name: Not on file  . Number of children: 2  . Years of education: Not on file  . Highest education level: Master's degree (e.g., MA, MS, MEng, MEd, MSW, MBA)  Occupational History  . Occupation: retired  Tobacco Use  . Smoking status: Former Smoker    Years: 10.00    Quit date: 07/08/1962    Years since quitting: 58.0  . Smokeless tobacco: Never Used  . Tobacco comment: quit 1964  Vaping Use  . Vaping Use: Never used  Substance and Sexual Activity  .  Alcohol use: Yes    Alcohol/week: 0.0 - 1.0 standard drinks  . Drug use: No  . Sexual activity: Not on file  Other Topics Concern  . Not on file  Social History Narrative  . Not on file   Social Determinants of Health   Financial Resource Strain: Low Risk   . Difficulty of Paying Living Expenses: Not hard at all  Food Insecurity: No Food Insecurity  . Worried About Charity fundraiser in the Last Year: Never true  . Ran Out of Food in the Last Year: Never true  Transportation Needs: No Transportation Needs  . Lack of Transportation (Medical): No  . Lack of Transportation (Non-Medical): No  Physical Activity: Sufficiently Active  . Days of Exercise per Week: 6 days  . Minutes of Exercise per Session: 40 min  Stress:  No Stress Concern Present  . Feeling of Stress : Not at all  Social Connections: Moderately Isolated  . Frequency of Communication with Friends and Family: Once a week  . Frequency of Social Gatherings with Friends and Family: More than three times a week  . Attends Religious Services: Never  . Active Member of Clubs or Organizations: No  . Attends Archivist Meetings: Never  . Marital Status: Married     Family History: The patient's family history includes Cancer in his mother; Prostate cancer in his brother; Stroke in his father and sister. There is no history of Kidney disease, Kidney cancer, or Bladder Cancer.  ROS:   Please see the history of present illness.     All other systems reviewed and are negative.  EKGs/Labs/Other Studies Reviewed:    The following studies were reviewed today:   EKG:  EKG is  ordered today.  The ekg ordered today demonstrates sinus rhythm, first-degree AV block  Recent Labs: 06/09/2020: ALT 21; BUN 24; Creatinine, Ser 0.94; Hemoglobin 13.4; Platelets 190; Potassium 4.7; Sodium 136; TSH 2.640  Recent Lipid Panel    Component Value Date/Time   CHOL 176 06/09/2020 1128   TRIG 65 06/09/2020 1128   HDL 73 06/09/2020 1128   CHOLHDL 2.4 06/09/2020 1128   CHOLHDL 2.8 04/01/2017 1330   LDLCALC 91 06/09/2020 1128   LDLCALC 92 04/01/2017 1330     Risk Assessment/Calculations:      Physical Exam:    VS:  BP 120/60 (BP Location: Left Arm, Patient Position: Sitting, Cuff Size: Normal)   Pulse 77   Ht 5\' 9"  (1.753 m)   Wt 157 lb (71.2 kg)   SpO2 97%   BMI 23.18 kg/m     Wt Readings from Last 3 Encounters:  06/23/20 157 lb (71.2 kg)  06/15/20 156 lb (70.8 kg)  10/11/19 161 lb (73 kg)     GEN:  Well nourished, well developed in no acute distress HEENT: Normal NECK: No JVD; No carotid bruits LYMPHATICS: No lymphadenopathy CARDIAC: RRR, 3/6 systolic murmur heard loudest at apex. RESPIRATORY:  Clear to auscultation without rales,  wheezing or rhonchi  ABDOMEN: Soft, non-tender, distended MUSCULOSKELETAL:  2+ edema; No deformity  SKIN: Warm and dry NEUROLOGIC:  Alert and oriented x 3 PSYCHIATRIC:  Normal affect   ASSESSMENT:    1. Mitral valve insufficiency, unspecified etiology   2. Aortic valve stenosis, etiology of cardiac valve disease unspecified   3. Primary hypertension    PLAN:    In order of problems listed above:  1. Moderate to severe MR, eccentric jet noted on transthoracic echo.  Discussion had with  patient wife and daughter regarding TEE and better evaluation of mitral regurgitation.  Wife thinks this is invasive and has a recent memory of friend with esophageal perforation.  Due to patient's age, family will not want to proceed with any invasive procedures at this time.  I think this is reasonable.  Start torsemide 20 mg daily.  Stop HCTZ 2. Mild to moderate aortic valve stenosis.  Monitor with serial echocardiograms for progression as per [facility] AHA guidelines. 3. Hypertension, BP controlled, stop HCTZ, start torsemide, continue amlodipine   We will up in 2 months.  Total encounter time more than 75 minutes  Greater than 50% was spent in counseling and coordination of care with the patient and family.  Long discussions had with patient and family regarding etiology, and management of mitral valve disease including percutaneous and surgical options.    Medication Adjustments/Labs and Tests Ordered: Current medicines are reviewed at length with the patient today.  Concerns regarding medicines are outlined above.  Orders Placed This Encounter  Procedures  . Basic metabolic panel  . EKG 12-Lead  . ECHOCARDIOGRAM COMPLETE   Meds ordered this encounter  Medications  . torsemide (DEMADEX) 20 MG tablet    Sig: Take 1 tablet (20 mg total) by mouth daily.    Dispense:  30 tablet    Refill:  3    Patient Instructions  Medication Instructions:   Your physician has recommended you make the  following change in your medication:   1.  STOP taking Hydrochlorothiazide. 2.  START taking Torsemide 20 MG: Take 1 tablet by mouth daily.  *If you need a refill on your cardiac medications before your next appointment, please call your pharmacy*   Lab Work:  Your physician recommends that you return for lab work in: 1 week.  - Please go to the [facility]. You will check in at the front desk to the right as you walk into the atrium. Valet Parking is offered if needed.  - No appointment needed. You may go any day between 7 am and 6 pm.    Testing/Procedures:  Your physician has requested that you have an echocardiogram in 6 months. Echocardiography is a painless test that uses sound waves to create images of your heart. It provides your doctor with information about the size and shape of your heart and how well your heart's chambers and valves are working. This procedure takes approximately one hour. There are no restrictions for this procedure.     Follow-Up: At [facility], you and your health needs are our priority.  As part of our continuing mission to provide you with exceptional heart care, we have created designated Provider Care Teams.  These Care Teams include your primary Cardiologist (physician) and Advanced Practice Providers (APPs -  Physician Assistants and Nurse Practitioners) who all work together to provide you with the care you need, when you need it.  We recommend signing up for the patient portal called "MyChart".  Sign up information is provided on this After Visit Summary.  MyChart is used to connect with patients for Virtual Visits (Telemedicine).  Patients are able to view lab/test results, encounter notes, upcoming appointments, etc.  Non-urgent messages can be sent to your provider as well.   To learn more about what you can do with MyChart, go to [email or web address].    Your next appointment:   2 month(s)  The format for your next  appointment:   In Person  Provider:   Kate Sable,  MD   Other Instructions      Signed, Kate Sable, MD  06/23/2020 12:57 PM    [place]

## 2020-06-23 NOTE — Patient Instructions (Signed)
Medication Instructions:   Your physician has recommended you make the following change in your medication:   1.  STOP taking Hydrochlorothiazide. 2.  START taking Torsemide 20 MG: Take 1 tablet by mouth daily.  *If you need a refill on your cardiac medications before your next appointment, please call your pharmacy*   Lab Work:  Your physician recommends that you return for lab work in: 1 week.  - Please go to the [facility]. You will check in at the front desk to the right as you walk into the atrium. Valet Parking is offered if needed.  - No appointment needed. You may go any day between 7 am and 6 pm.    Testing/Procedures:  Your physician has requested that you have an echocardiogram in 6 months. Echocardiography is a painless test that uses sound waves to create images of your heart. It provides your doctor with information about the size and shape of your heart and how well your heart's chambers and valves are working. This procedure takes approximately one hour. There are no restrictions for this procedure.     Follow-Up: At [facility] you and your health needs are our priority.  As part of our continuing mission to provide you with exceptional heart care, we have created designated Provider Care Teams.  These Care Teams include your primary Cardiologist (physician) and Advanced Practice Providers (APPs -  Physician Assistants and Nurse Practitioners) who all work together to provide you with the care you need, when you need it.  We recommend signing up for the patient portal called "MyChart".  Sign up information is provided on this After Visit Summary.  MyChart is used to connect with patients for Virtual Visits (Telemedicine).  Patients are able to view lab/test results, encounter notes, upcoming appointments, etc.  Non-urgent messages can be sent to your provider as well.   To learn more about what you can do with MyChart, go to [email or web address].     Your next appointment:   2 month(s)  The format for your next appointment:   In Person  Provider:   Kate Sable, MD   Other Instructions

## 2020-06-26 NOTE — Progress Notes (Signed)
Established patient visit   Patient: [name]   DOB: December 09, 1924   86 y.o. Male  MRN: [id number] Visit Date: 06/27/2020  Today's healthcare provider: Lavon Paganini, MD   Chief Complaint  Patient presents with  . Follow-up   Subjective    HPI   After last visit when murmur was heard, echocardiogram was done and showed aortic stenosis and moderate to severe mitral regurg.  He was seen by cardiology and changed from HCTZ to torsemide.  They discussed possibility of TEE to evaluate better and valve replacement/surgical options.  The family is present today-him, his wife, and his daughter are present and his other daughter is on the phone for the visit.  They did not understand the explanation about the echocardiogram results from cardiology.  We discussed in depth what the echo shows and treatment options.  He is not interested in any surgery and feels like that is risky at 43.  He is minimally symptomatic with some decreased exercise tolerance.  He continues to walk at least a mile every day, but does stop in the middle to take a rest now.  Patient Active Problem List   Diagnosis Date Noted  . Mitral regurgitation and aortic stenosis 06/27/2020  . Encounter for annual physical exam 06/15/2020  . Murmur, cardiac 06/15/2020  . Recurrent major depressive disorder, in full remission ([place]) 06/15/2020  . Calf swelling 06/15/2020  . Porokeratosis 03/16/2020  . Adjustment disorder with mixed anxiety and depressed mood 06/17/2019  . Memory difficulties 06/17/2019  . Senile purpura ([place]) 05/21/2018  . Frequent falls 04/01/2017  . Constipation 03/31/2017  . Eczema 02/12/2017  . Left leg pain 02/12/2017  . Urinary frequency 09/18/2015  . Allergic rhinitis 06/27/2015  . H/O: CVA (cerebrovascular accident) 06/27/2015  . DD (diverticular disease) 06/27/2015  . Benign essential HTN 06/27/2015  . Hypercholesterolemia 06/27/2015  . Fungal infection of nail 06/27/2015  .  Prediabetes 03/15/2015  . Prostate cancer ([place]) 12/18/2014  . Arthritis, degenerative 01/12/2014  . H/O malignant neoplasm of prostate 12/03/2012  . Urge incontinence 11/09/2012  . Obstruction of urinary tract 11/09/2012  . Dermatophytosis of groin 09/29/2012  . 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 Incomplete bladder emptying 09/29/2012   Social History   Tobacco Use  . Smoking status: Former Smoker    Years: 10.00    Quit date: 07/08/1962    Years since quitting: 58.0  . Smokeless tobacco: Never Used  . Tobacco comment: quit 1964  Vaping Use  . Vaping Use: Never used  Substance Use Topics  . Alcohol use: Yes    Alcohol/week: 0.0 - 1.0 standard drinks  . Drug use: No   Allergies  Allergen Reactions  . Toviaz [Fesoterodine Fumarate Er] Other (See Comments)    Acid reflux       Medications: Outpatient Medications Prior to Visit  Medication Sig  . amLODipine (NORVASC) 5 MG tablet Take 1 tablet (5 mg total) by mouth daily.  [name] aspirin EC 81 MG tablet Take 81 mg by mouth daily.  . fluticasone (FLONASE) 50 MCG/ACT nasal spray Place 1 spray into both nostrils daily.  . Multiple Vitamin (MULTI-VITAMIN) tablet Take 1 tablet by mouth daily.   . naproxen sodium (ALEVE) 220 MG tablet Take 220 mg by mouth daily as needed (pain/headache).  Vladimir Faster Glycol-Propyl Glycol (SYSTANE OP) Place 1 drop into both eyes daily.   [name] torsemide (DEMADEX) 20 MG tablet Take 1 tablet (20 mg total) by  mouth daily.  . Zinc 50 MG TABS Take 50 mg by mouth daily.   No facility-administered medications prior to visit.    Review of Systems  Constitutional: Negative.   Respiratory: Negative.   Cardiovascular: Negative.   Neurological: Negative.   Psychiatric/Behavioral: Negative.     Last CBC Lab Results  Component Value Date   WBC 4.6 06/09/2020   HGB 13.4 06/09/2020   HCT 40.1 06/09/2020   MCV 91 06/09/2020   MCH 30.5 06/09/2020   RDW 12.9 06/09/2020   PLT 190  95/03/3266   Last metabolic panel Lab Results  Component Value Date   GLUCOSE 105 (H) 06/09/2020   NA 136 06/09/2020   K 4.7 06/09/2020   CL 98 06/09/2020   CO2 23 06/09/2020   BUN 24 06/09/2020   CREATININE 0.94 06/09/2020   GFRNONAA 70 06/09/2020   GFRAA 81 06/09/2020   CALCIUM 10.1 06/09/2020   PROT 6.5 06/09/2020   ALBUMIN 4.3 06/09/2020   LABGLOB 2.2 06/09/2020   AGRATIO 2.0 06/09/2020   BILITOT 0.8 06/09/2020   ALKPHOS 69 06/09/2020   AST 32 06/09/2020   ALT 21 06/09/2020   ANIONGAP 8 04/11/2018      Objective    BP (!) 155/72 (BP Location: Right Arm, Patient Position: Sitting, Cuff Size: Normal)   Pulse 65   Temp (!) 97.5 F (36.4 C) (Oral)   Wt 156 lb (70.8 kg)   BMI 23.04 kg/m  BP Readings from Last 3 Encounters:  06/27/20 (!) 155/72  06/23/20 120/60  06/15/20 133/64   Wt Readings from Last 3 Encounters:  06/27/20 156 lb (70.8 kg)  06/23/20 157 lb (71.2 kg)  06/15/20 156 lb (70.8 kg)      Physical Exam Vitals reviewed.  Constitutional:      General: He is not in acute distress.    Appearance: Normal appearance.  HENT:     Head: Normocephalic and atraumatic.  Cardiovascular:     Rate and Rhythm: Normal rate.  Pulmonary:     Effort: Pulmonary effort is normal. No respiratory distress.  Neurological:     Mental Status: He is alert and oriented to person, place, and time. Mental status is at baseline.  Psychiatric:        Mood and Affect: Mood normal.        Behavior: Behavior normal.      No results found for any visits on 06/27/20.  Assessment & Plan     Problem List Items Addressed This Visit      Cardiovascular and Mediastinum   Mitral regurgitation and aortic stenosis - Primary    New diagnosis recently after echocardiogram results Discussed that the mitral regurg explains the murmur that was heard on exam Significant amount of time spent reviewing the echo results with the family and discussing heart anatomy and  pathophysiology Discussed that the only way to truly fix this is with valve surgery/replacement with CVTS, but patient's goals of care do not align with surgical fixation at age 64 when he is only mildly symptomatic He can continue torsemide at current dose as he appears euvolemic and blood pressure is stable We will recheck BMP to assess potassium level He should have a repeat echo in 6 months We will start cardiac rehab with physical therapy at [facility] where he resides to see if this can build some exercise tolerance for him Discussed return precautions Follow-up in 3 months or sooner as needed      Relevant Orders  Ambulatory referral to Physical Therapy   Basic Metabolic Panel (BMET)       Return in about 3 months (around 09/25/2020) for chronic disease f/u.      Total time spent on today's visit was greater than 30 minutes, including both face-to-face time and nonface-to-face time personally spent on review of chart (labs and imaging), discussing labs and goals, discussing further work-up, treatment options, referrals to specialist if needed, reviewing outside records of pertinent, answering patient's questions, and coordinating care.    I, Lavon Paganini, MD, have reviewed all documentation for this visit. The documentation on 06/27/20 for the exam, diagnosis, procedures, and orders are all accurate and complete.   Latika Kronick, Dionne Bucy, MD, MPH [facility]

## 2020-06-27 ENCOUNTER — Ambulatory Visit (INDEPENDENT_AMBULATORY_CARE_PROVIDER_SITE_OTHER): Payer: Medicare Other | Admitting: Family Medicine

## 2020-06-27 ENCOUNTER — Encounter: Payer: Self-pay | Admitting: Family Medicine

## 2020-06-27 ENCOUNTER — Other Ambulatory Visit: Payer: Self-pay

## 2020-06-27 VITALS — BP 155/72 | HR 65 | Temp 97.5°F | Wt 156.0 lb

## 2020-06-27 DIAGNOSIS — I080 Rheumatic disorders of both mitral and aortic valves: Secondary | ICD-10-CM | POA: Diagnosis not present

## 2020-06-27 NOTE — Assessment & Plan Note (Signed)
New diagnosis recently after echocardiogram results Discussed that the mitral regurg explains the murmur that was heard on exam Significant amount of time spent reviewing the echo results with the family and discussing heart anatomy and pathophysiology Discussed that the only way to truly fix this is with valve surgery/replacement with CVTS, but patient's goals of care do not align with surgical fixation at age 86 when he is only mildly symptomatic He can continue torsemide at current dose as he appears euvolemic and blood pressure is stable We will recheck BMP to assess potassium level He should have a repeat echo in 6 months We will start cardiac rehab with physical therapy at [facility] where he resides to see if this can build some exercise tolerance for him Discussed return precautions Follow-up in 3 months or sooner as needed

## 2020-06-27 NOTE — Patient Instructions (Signed)
Mitral Valve Regurgitation  Mitral valve regurgitation, also called mitral regurgitation, is a condition in which some blood leaks back (regurgitates) through the mitral valve in the heart. The mitral valve is located between the upper left chamber (left atrium) and the lower left chamber (left ventricle) of the heart. When blood travels through the heart, it goes from the left atrium to the left ventricle and then out to the body. Normally, the mitral valve opens when the atrium pumps blood into the ventricle, and it closes when the ventricle pumps blood out to the body. Mitral valve regurgitation happens when the mitral valve does not close properly. As a result, blood in the ventricle leaks back into the atrium. Mitral valve regurgitation causes the heart to work harder to pump blood. If the condition is mild, a person may not have symptoms. However, over time, this can lead to heart failure. What are the causes? This condition may be caused by:  A condition in which the mitral valve does not close completely when the heart pumps blood (mitral valve prolapse).  Heart valve infection (endocarditis).  Certain types of heart disease.  A condition that causes inflammation of the heart, blood vessels, or joints (rheumatic fever).  Certain conditions that are present at birth (congenital heart defect).  Previous radiation therapy to the chest area.  Damage to the mitral valve, such as from injury (trauma) to the heart or a heart attack.  Certain combinations of weight-loss (anti-obesity) medicines. What are the signs or symptoms? In some cases of mild to moderate mitral regurgitation, there are no symptoms. Symptoms of this condition include:  Shortness of breath.  Fatigue.  Activity intolerance.  Cough. Severe symptoms of this condition include:  Suddenly waking up at night with difficulty breathing.  Fast or irregular heartbeat.  Swelling in the lower legs, ankles, and  feet.  Fluid in the lungs that makes it very hard to breathe (pulmonary edema). How is this diagnosed? This condition may be diagnosed based on:  A physical exam. Your health care provider will listen to your heart for an abnormal heart sound (murmur).  Tests to confirm the diagnosis. These may include: ? A test that creates ultrasound images of the heart (echocardiogram). This test allows your health care provider to see how the heart valves work while your heart is beating. ? Chest X-ray. ? A test that records the electrical impulses of the heart (electrocardiogram, or ECG). ? A test that looks at the structure and function of the heart (cardiac catheterization). How is this treated? This condition may be treated with:  Medicines. These may be given to treat symptoms and prevent complications.  Surgery or catheter-based procedures to repair or replace the mitral valve. This may be done in severe, long-term (chronic) cases. Follow these instructions at home: Eating and drinking   Eat a heart-healthy diet that includes whole grains, fresh fruits and vegetables, low-fat (lean) proteins, and low-fat or nonfat dairy products. Consider working with a dietitian to help you make healthy food choices.  Limit how much salt (sodium) you eat as told by your health care provider. Follow instructions from your health care provider about any other eating or drinking restrictions, such as limiting foods that are high in fat and processed sugars.  Use healthy cooking methods, such as roasting, grilling, broiling, baking, poaching, steaming, or stir-frying. Alcohol use  Do not drink alcohol if: ? Your health care provider tells you not to drink. ? You are pregnant, may be pregnant,  or are planning to become pregnant.  If you drink alcohol: ? Limit how much you use to:  0-1 drink a day for women.  0-2 drinks a day for men. ? Be aware of how much alcohol is in your drink. In the [place] one drink  equals one 12 oz bottle of beer (355 mL), one 5 oz glass of wine (148 mL), or one 1 oz glass of hard liquor (44 mL). Activity  Return to your normal activities as told by your health care provider. Ask your health care provider what activities are safe for you.  Regular exercise is important for the health of your heart and for maintaining a healthy weight. Ask your health care provider what type of exercise is safe for you. You may need to avoid strenuous exercise. Lifestyle  Maintain a healthy weight.  Do not use any products that contain nicotine or tobacco, such as cigarettes, e-cigarettes, and chewing tobacco. If you need help quitting, ask your health care provider.  Find ways to manage stress. If you need help with this, ask your health care provider. General instructions  Take over-the-counter and prescription medicines only as told by your health care provider.  Work closely with your health care provider to manage any other health conditions you have, such as diabetes or high blood pressure.  If you plan to become pregnant, talk with your health care provider first.  Keep all follow-up visits as told by your health care provider. This is important. Contact a health care provider if you:  Have a fever.  Feel more tired than usual when doing physical activity.  Have a dry cough. Get help right away if you:  Have shortness of breath.  Develop chest pain.  Have swelling in your hands, feet, ankles, or abdomen that is getting worse.  Have trouble staying awake or you faint.  Feel dizzy or unsteady.  Suddenly gain weight.  Feel confused. These symptoms may represent a serious problem that is an emergency. Do not wait to see if the symptoms will go away. Get medical help right away. Call your local emergency services (911 in the [place] Do not drive yourself to the hospital. Summary  Mitral valve regurgitation, also called mitral regurgitation, is a condition in  which some blood leaks back (regurgitates) through the mitral valve in the heart.  Depending on how severe your condition is, you may be treated with medicines, catheter-based procedures, or surgery.  Practice heart-healthy habits to manage this condition. These include limiting alcohol, avoiding nicotine and tobacco, and eating a heart-healthy diet that is low in salt (sodium). This information is not intended to replace advice given to you by your health care provider. Make sure you discuss any questions you have with your health care provider. Document Revised: 06/07/2018 Document Reviewed: 06/07/2018 Elsevier Patient Education  2020 [employer].

## 2020-06-28 LAB — BASIC METABOLIC PANEL
BUN/Creatinine Ratio: 37 — ABNORMAL HIGH (ref 10–24)
BUN: 39 mg/dL — ABNORMAL HIGH (ref 10–36)
CO2: 28 mmol/L (ref 20–29)
Calcium: 10.3 mg/dL — ABNORMAL HIGH (ref 8.6–10.2)
Chloride: 102 mmol/L (ref 96–106)
Creatinine, Ser: 1.05 mg/dL (ref 0.76–1.27)
GFR calc Af Amer: 71 mL/min/{1.73_m2} (ref >59)
GFR calc non Af Amer: 61 mL/min/{1.73_m2} (ref >59)
Glucose: 109 mg/dL — ABNORMAL HIGH (ref 65–99)
Potassium: 4.3 mmol/L (ref 3.5–5.2)
Sodium: 143 mmol/L (ref 134–144)

## 2020-07-03 ENCOUNTER — Telehealth: Payer: Self-pay

## 2020-07-03 NOTE — Telephone Encounter (Signed)
Copied from [place] [id number]. Topic: General - Inquiry >> Jul 03, 2020  3:26 PM Gillis Ends D wrote: Reason for CRM: Patients daughter called and wanted to know if it was okay for his dad to drink a glass of red wine daily. She can be reached at [phone]. Please advise

## 2020-07-03 NOTE — Telephone Encounter (Signed)
I think this can wait for Dr. B to address.

## 2020-07-04 NOTE — Telephone Encounter (Signed)
Mrs. Darl Pikes advised that Dr. Beryle Flock is out of the office and will be back on 07/10/2020.

## 2020-07-10 NOTE — Telephone Encounter (Signed)
Yes. Ok to have a glass of red wine daily.

## 2020-07-11 NOTE — Telephone Encounter (Signed)
Left message advising Darl Pikes. (Pt's daughter)  Thanks,   Vernona Rieger

## 2020-07-31 ENCOUNTER — Telehealth: Payer: Self-pay

## 2020-07-31 DIAGNOSIS — R296 Repeated falls: Secondary | ICD-10-CM

## 2020-07-31 NOTE — Telephone Encounter (Signed)
Copied from [place] [id number]. Topic: General - Inquiry >> Jul 31, 2020  3:12 PM Greggory Keen D wrote: Reason for CRM: Pt's wife called asking about the rehab referral.  I looked and seen the messages.  I asked her if he would do physical therapy and she said yes he would.  She would like a referral for PT.   She states his balance has been off lately.  CB#  [phone]

## 2020-08-01 NOTE — Telephone Encounter (Signed)
Referral faxed to [facility] [phone]

## 2020-08-01 NOTE — Addendum Note (Signed)
Addended by: Shawna Orleans on: 08/01/2020 04:01 PM   Modules accepted: Orders

## 2020-08-08 DIAGNOSIS — R2681 Unsteadiness on feet: Secondary | ICD-10-CM | POA: Diagnosis not present

## 2020-08-08 DIAGNOSIS — R296 Repeated falls: Secondary | ICD-10-CM | POA: Diagnosis not present

## 2020-08-09 ENCOUNTER — Telehealth: Payer: Self-pay | Admitting: Family Medicine

## 2020-08-09 NOTE — Telephone Encounter (Signed)
Pt daughter laura beth Wilshire who is on DPR is calling and her dad had an appt with dr b on 06/27/2020. Mickel Baas thought dr b was ordering cardiac rehab for her dad MVP instead her dad is having physical therapy at [facility] for balancing. Please call laura back

## 2020-08-10 NOTE — Telephone Encounter (Signed)
Patient did not qualify for cardiac rehab, so though [facility] PT to build up strength was better than nothing

## 2020-08-10 NOTE — Telephone Encounter (Signed)
NA, voicemail full 

## 2020-08-10 NOTE — Telephone Encounter (Signed)
Patient's daughter Brett Fairy Pucci that is on the DPR is calling back. Did advise that her VM is full. And to look out for a call back [phone]

## 2020-08-11 NOTE — Telephone Encounter (Signed)
Laura advised as below. 

## 2020-08-12 DIAGNOSIS — R296 Repeated falls: Secondary | ICD-10-CM | POA: Diagnosis not present

## 2020-08-12 DIAGNOSIS — R2681 Unsteadiness on feet: Secondary | ICD-10-CM | POA: Diagnosis not present

## 2020-08-15 ENCOUNTER — Ambulatory Visit: Payer: Self-pay | Admitting: Family Medicine

## 2020-08-15 DIAGNOSIS — R2681 Unsteadiness on feet: Secondary | ICD-10-CM | POA: Diagnosis not present

## 2020-08-15 DIAGNOSIS — R296 Repeated falls: Secondary | ICD-10-CM | POA: Diagnosis not present

## 2020-08-15 NOTE — Telephone Encounter (Signed)
Pt states he is concerned he is urinating too much with the torsemide. States has voided 9 xs this evening "Strong stream" States he is concerned he is dehydrated. No symptoms of dehydration. States urine light yellow. No dizziness. Assured agent sent message to practice regarding med for PCPs review. Pt verbalizes understanding.

## 2020-08-16 NOTE — Telephone Encounter (Signed)
It's supposed to cause urinary frequency, but can cause dehydration. If any symptoms of dehydration can put on hold a few days. Either way should make office visit to evaluate for dehydration and check some labs. Can see PA this week or Dr. B next week.

## 2020-08-16 NOTE — Telephone Encounter (Signed)
Copied from [place] [id number]. Topic: General - Other >> Aug 16, 2020  9:11 AM Antonieta Iba C wrote: Reason for CRM: pt's spouse called in for assistance. Pt/spouse spoke with NT for triaging. Spouse would like a call back from the office to be advised further on medication. Spouse says that PCP didn't prescribe medication, pt has never seen cardiologist that prescribed.    [phone] -

## 2020-08-17 DIAGNOSIS — R296 Repeated falls: Secondary | ICD-10-CM | POA: Diagnosis not present

## 2020-08-17 DIAGNOSIS — R2681 Unsteadiness on feet: Secondary | ICD-10-CM | POA: Diagnosis not present

## 2020-08-17 NOTE — Telephone Encounter (Signed)
Patient's wife returned call to schedule appointment with Dr. B next week. There are no openings on her schedule next week and patient only wants to see Dr. Jacinto Reap. Please review schedule and call patient to to let them know is she is able to work patient in.

## 2020-08-17 NOTE — Telephone Encounter (Signed)
Patient advised as below and verbalized understanding. Patient prefers to see Dr. Jacinto Reap. He states he is going to have his wife call back to our office to schedule an appointment.

## 2020-08-21 NOTE — Telephone Encounter (Signed)
Can they come at 1040 today? Use 11 slot, but they will likely need a longer slot, so tell them they are scheduled at 1040, so I can see them earlier.

## 2020-08-21 NOTE — Telephone Encounter (Signed)
LMTCB per Dr. B patient can be scheduled for tomorrow on 08/22/2020 at 3:20pm.

## 2020-08-22 ENCOUNTER — Ambulatory Visit (INDEPENDENT_AMBULATORY_CARE_PROVIDER_SITE_OTHER): Payer: Medicare Other | Admitting: Family Medicine

## 2020-08-22 ENCOUNTER — Other Ambulatory Visit: Payer: Self-pay

## 2020-08-22 VITALS — BP 164/94 | HR 87 | Temp 98.4°F | Wt 154.0 lb

## 2020-08-22 DIAGNOSIS — G6289 Other specified polyneuropathies: Secondary | ICD-10-CM

## 2020-08-22 DIAGNOSIS — R350 Frequency of micturition: Secondary | ICD-10-CM

## 2020-08-22 DIAGNOSIS — R2681 Unsteadiness on feet: Secondary | ICD-10-CM | POA: Diagnosis not present

## 2020-08-22 DIAGNOSIS — R296 Repeated falls: Secondary | ICD-10-CM | POA: Diagnosis not present

## 2020-08-22 DIAGNOSIS — G629 Polyneuropathy, unspecified: Secondary | ICD-10-CM | POA: Insufficient documentation

## 2020-08-22 DIAGNOSIS — N3091 Cystitis, unspecified with hematuria: Secondary | ICD-10-CM | POA: Diagnosis not present

## 2020-08-22 DIAGNOSIS — I080 Rheumatic disorders of both mitral and aortic valves: Secondary | ICD-10-CM

## 2020-08-22 DIAGNOSIS — N390 Urinary tract infection, site not specified: Secondary | ICD-10-CM | POA: Diagnosis not present

## 2020-08-22 DIAGNOSIS — L84 Corns and callosities: Secondary | ICD-10-CM

## 2020-08-22 LAB — POCT URINALYSIS DIPSTICK
Bilirubin, UA: NEGATIVE
Glucose, UA: NEGATIVE
Ketones, UA: NEGATIVE
Nitrite, UA: NEGATIVE
Protein, UA: NEGATIVE
Spec Grav, UA: 1.02 (ref 1.010–1.025)
Urobilinogen, UA: 0.2 E.U./dL
pH, UA: 6.5 (ref 5.0–8.0)

## 2020-08-22 MED ORDER — GABAPENTIN 100 MG PO CAPS: 100.0000 mg | ORAL_CAPSULE | Freq: Every day | ORAL | 3 refills | Status: DC

## 2020-08-22 MED ORDER — FUROSEMIDE 20 MG PO TABS: 20.0000 mg | ORAL_TABLET | Freq: Every day | ORAL | 3 refills | Status: DC

## 2020-08-22 MED ORDER — SULFAMETHOXAZOLE-TRIMETHOPRIM 800-160 MG PO TABS: 1.0000 | ORAL_TABLET | Freq: Two times a day (BID) | ORAL | 0 refills | Status: AC

## 2020-08-22 NOTE — Progress Notes (Signed)
Acute Office Visit  Subjective:    Patient ID: [name], male    DOB: [birth year], 87 y.o.   MRN: [id number]  Chief Complaint  Patient presents with  . Urinary Frequency    HPI Patient is in today for evaluation of the possibility of dehydration due to increased urination.  Patient has come off of the Torsemide x5-7 days and states that he is doing better now.  Although he said today he has been urinating a little more than his normal.   Past Medical History:  Diagnosis Date  . Arthritis   . Bladder outlet obstruction   . Erectile dysfunction   . GERD (gastroesophageal reflux disease)   . Hard of hearing   . HBP (high blood pressure)   . History of nephrolithiasis   . HLD (hyperlipidemia)   . Incomplete bladder emptying   . Nocturia   . Osteoporosis   . Prostate cancer ([place])   . Shingles 2014  . Sinus problem   . Stomach ulcer   . Stroke ([place]) 2012  . Urinary frequency   . Urinary leakage     Past Surgical History:  Procedure Laterality Date  . CATARACT EXTRACTION  2000, 2001  . CHOLECYSTECTOMY  1972  . PROSTATE SURGERY  2008/2009  . TONSILLECTOMY    . UMBILICAL HERNIA REPAIR      Family History  Problem Relation Age of Onset  . Cancer Mother   . Stroke Father   . Stroke Sister   . Prostate cancer Brother   . Kidney disease Neg Hx   . Kidney cancer Neg Hx   . Bladder Cancer Neg Hx     Social History   Socioeconomic History  . Marital status: Married    Spouse name: Not on file  . Number of children: 2  . Years of education: Not on file  . Highest education level: Master's degree (e.g., MA, MS, MEng, MEd, MSW, MBA)  Occupational History  . Occupation: retired  Tobacco Use  . Smoking status: Former Smoker    Years: 10.00    Quit date: 07/08/1962    Years since quitting: 58.1  . Smokeless tobacco: Never Used  . Tobacco comment: quit 1964  Vaping Use  . Vaping Use: Never used  Substance and Sexual Activity  . Alcohol use: Yes     Alcohol/week: 0.0 - 1.0 standard drinks  . Drug use: No  . Sexual activity: Not on file  Other Topics Concern  . Not on file  Social History Narrative  . Not on file   Social Determinants of Health   Financial Resource Strain: Low Risk   . Difficulty of Paying Living Expenses: Not hard at all  Food Insecurity: No Food Insecurity  . Worried About Charity fundraiser in the Last Year: Never true  . Ran Out of Food in the Last Year: Never true  Transportation Needs: No Transportation Needs  . Lack of Transportation (Medical): No  . Lack of Transportation (Non-Medical): No  Physical Activity: Sufficiently Active  . Days of Exercise per Week: 6 days  . Minutes of Exercise per Session: 40 min  Stress: No Stress Concern Present  . Feeling of Stress : Not at all  Social Connections: Moderately Isolated  . Frequency of Communication with Friends and Family: Once a week  . Frequency of Social Gatherings with Friends and Family: More than three times a week  . Attends Religious Services: Never  . Active Member of Clubs  or Organizations: No  . Attends Archivist Meetings: Never  . Marital Status: Married  Human resources officer Violence: Not At Risk  . Fear of Current or Ex-Partner: No  . Emotionally Abused: No  . Physically Abused: No  . Sexually Abused: No    Outpatient Medications Prior to Visit  Medication Sig Dispense Refill  . amLODipine (NORVASC) 5 MG tablet Take 1 tablet (5 mg total) by mouth daily. 90 tablet 3  . aspirin EC 81 MG tablet Take 81 mg by mouth daily.    . fluticasone (FLONASE) 50 MCG/ACT nasal spray Place 1 spray into both nostrils daily. 16 g 5  . Multiple Vitamin (MULTI-VITAMIN) tablet Take 1 tablet by mouth daily.     . naproxen sodium (ALEVE) 220 MG tablet Take 220 mg by mouth daily as needed (pain/headache).    Vladimir Faster Glycol-Propyl Glycol (SYSTANE OP) Place 1 drop into both eyes daily.     . Zinc 50 MG TABS Take 50 mg by mouth daily.    [name]  torsemide (DEMADEX) 20 MG tablet Take 1 tablet (20 mg total) by mouth daily. (Patient not taking: Reported on 08/22/2020) 30 tablet 3   No facility-administered medications prior to visit.    Allergies  Allergen Reactions  . Toviaz [Fesoterodine Fumarate Er] Other (See Comments)    Acid reflux    Review of Systems  Respiratory: Negative for cough and shortness of breath.   Cardiovascular: Negative for chest pain, palpitations and leg swelling.  Neurological: Positive for dizziness. Negative for headaches.       Objective:    Physical Exam Constitutional:      General: He is not in acute distress.    Appearance: Normal appearance. He is not diaphoretic.  HENT:     Head: Normocephalic and atraumatic.  Eyes:     Conjunctiva/sclera: Conjunctivae normal.  Cardiovascular:     Rate and Rhythm: Normal rate and regular rhythm.     Pulses: Normal pulses.     Heart sounds: Murmur heard.    Pulmonary:     Effort: Pulmonary effort is normal. No respiratory distress.     Breath sounds: Normal breath sounds. No wheezing.  Abdominal:     General: There is no distension.     Palpations: Abdomen is soft.     Tenderness: There is no abdominal tenderness.  Musculoskeletal:     Right lower leg: Edema present.     Left lower leg: Edema present.  Skin:    Findings: No rash.     Comments: 1cm ulcer on plantar surface of L foot  Neurological:     Mental Status: He is alert and oriented to person, place, and time. Mental status is at baseline.     Sensory: Sensory deficit (b/l toes) present.  Psychiatric:        Mood and Affect: Mood normal.        Behavior: Behavior normal.     BP (!) 164/94 (BP Location: Left Arm, Patient Position: Sitting, Cuff Size: Normal)   Pulse 87   Temp 98.4 F (36.9 C) (Oral)   Wt 154 lb (69.9 kg)   SpO2 96%   BMI 22.74 kg/m  Wt Readings from Last 3 Encounters:  08/22/20 154 lb (69.9 kg)  06/27/20 156 lb (70.8 kg)  06/23/20 157 lb (71.2 kg)     There are no preventive care reminders to display for this patient.  There are no preventive care reminders to display for this patient.  Lab Results  Component Value Date   TSH 2.640 06/09/2020   Lab Results  Component Value Date   WBC 4.6 06/09/2020   HGB 13.4 06/09/2020   HCT 40.1 06/09/2020   MCV 91 06/09/2020   PLT 190 06/09/2020   Lab Results  Component Value Date   NA 143 06/27/2020   K 4.3 06/27/2020   CO2 28 06/27/2020   GLUCOSE 109 (H) 06/27/2020   BUN 39 (H) 06/27/2020   CREATININE 1.05 06/27/2020   BILITOT 0.8 06/09/2020   ALKPHOS 69 06/09/2020   AST 32 06/09/2020   ALT 21 06/09/2020   PROT 6.5 06/09/2020   ALBUMIN 4.3 06/09/2020   CALCIUM 10.3 (H) 06/27/2020   ANIONGAP 8 04/11/2018   Lab Results  Component Value Date   CHOL 176 06/09/2020   Lab Results  Component Value Date   HDL 73 06/09/2020   Lab Results  Component Value Date   LDLCALC 91 06/09/2020   Lab Results  Component Value Date   TRIG 65 06/09/2020   Lab Results  Component Value Date   CHOLHDL 2.4 06/09/2020   Lab Results  Component Value Date   HGBA1C 5.6 06/09/2020       Assessment & Plan:   1. Cystitis with hematuria - Symptoms and UA consistent with UTI -No systemic symptoms or signs of pyelonephritis - Given hematuria, will send urine micro to confirm and will plan to recheck urine in about 6 weeks after completion of antibiotics to ensure hematuria has cleared -Will start treatment with 7day course of Bactrim  -We will send urine culture to confirm sensitivities -Discussed return precautions  - POCT urinalysis dipstick - Urinalysis, microscopic only - Urine Culture  2. Callus of foot - longstanding issue - patient feels discouraged and wants to see a new podiatrist about this - Ambulatory referral to Podiatry  3. Urinary frequency - likely multifactorial from UTI and torsemide - seems that torsemide may have been a little too powerful for him, so will  try lasix as below instead - treat UTI as above  4. Mitral regurgitation and aortic stenosis -Patient reports excessive urination and dehydration while taking torsemide -We will try Lasix 20 mg daily instead as this is a lower equivalent dose -Plan to recheck BMP at next visit in 1 month -Patient did not qualify for cardiac rehab, but he is going to work on increasing his exercise tolerance with cardio exercises and he is working with PT at [facility] for balance issues  5. Other polyneuropathy -Longstanding problem of bilateral feet -Reports pain from this -Has never tried gabapentin -We will start low-dose gabapentin 100 mg nightly -If tolerating well without side effects, can titrate dose as needed -Follow-up in 1 month   Return if symptoms worsen or fail to improve.   Total time spent on today's visit was greater than 40 minutes, including both face-to-face time and nonface-to-face time personally spent on review of chart (labs and imaging), discussing labs and goals, discussing further work-up, treatment options, referrals to specialist if needed, reviewing outside records of pertinent, answering patient's questions, and coordinating care.  Also included patient's daughter on the phone for the duration of the visit to answer questions that she had.  I, Lavon Paganini, MD, have reviewed all documentation for this visit. The documentation on 08/22/20 for the exam, diagnosis, procedures, and orders are all accurate and complete.   Kaori Jumper, Dionne Bucy, MD, MPH [facility]

## 2020-08-22 NOTE — Patient Instructions (Signed)
Urinary Tract Infection, Adult  A urinary tract infection (UTI) is an infection of any part of the urinary tract. The urinary tract includes the kidneys, ureters, bladder, and urethra. These organs make, store, and get rid of urine in the body. An upper UTI affects the ureters and kidneys. A lower UTI affects the bladder and urethra. What are the causes? Most urinary tract infections are caused by bacteria in your genital area around your urethra, where urine leaves your body. These bacteria grow and cause inflammation of your urinary tract. What increases the risk? You are more likely to develop this condition if:  You have a urinary catheter that stays in place.  You are not able to control when you urinate or have a bowel movement (incontinence).  You are male and you: ? Use a spermicide or diaphragm for birth control. ? Have low estrogen levels. ? Are pregnant.  You have certain genes that increase your risk.  You are sexually active.  You take antibiotic medicines.  You have a condition that causes your flow of urine to slow down, such as: ? An enlarged prostate, if you are male. ? Blockage in your urethra. ? A kidney stone. ? A nerve condition that affects your bladder control (neurogenic bladder). ? Not getting enough to drink, or not urinating often.  You have certain medical conditions, such as: ? Diabetes. ? A weak disease-fighting system (immunesystem). ? Sickle cell disease. ? Gout. ? Spinal cord injury. What are the signs or symptoms? Symptoms of this condition include:  Needing to urinate right away (urgency).  Frequent urination. This may include small amounts of urine each time you urinate.  Pain or burning with urination.  Blood in the urine.  Urine that smells bad or unusual.  Trouble urinating.  Cloudy urine.  Vaginal discharge, if you are male.  Pain in the abdomen or the lower back. You may also have:  Vomiting or a decreased  appetite.  Confusion.  Irritability or tiredness.  A fever or chills.  Diarrhea. The first symptom in older adults may be confusion. In some cases, they may not have any symptoms until the infection has worsened. How is this diagnosed? This condition is diagnosed based on your medical history and a physical exam. You may also have other tests, including:  Urine tests.  Blood tests.  Tests for STIs (sexually transmitted infections). If you have had more than one UTI, a cystoscopy or imaging studies may be done to determine the cause of the infections. How is this treated? Treatment for this condition includes:  Antibiotic medicine.  Over-the-counter medicines to treat discomfort.  Drinking enough water to stay hydrated. If you have frequent infections or have other conditions such as a kidney stone, you may need to see a health care provider who specializes in the urinary tract (urologist). In rare cases, urinary tract infections can cause sepsis. Sepsis is a life-threatening condition that occurs when the body responds to an infection. Sepsis is treated in the hospital with IV antibiotics, fluids, and other medicines. Follow these instructions at home: Medicines  Take over-the-counter and prescription medicines only as told by your health care provider.  If you were prescribed an antibiotic medicine, take it as told by your health care provider. Do not stop using the antibiotic even if you start to feel better. General instructions  Make sure you: ? Empty your bladder often and completely. Do not hold urine for long periods of time. ? Empty your bladder after   sex. ? Wipe from front to back after urinating or having a bowel movement if you are male. Use each tissue only one time when you wipe.  Drink enough fluid to keep your urine pale yellow.  Keep all follow-up visits. This is important.   Contact a health care provider if:  Your symptoms do not get better after 1-2  days.  Your symptoms go away and then return. Get help right away if:  You have severe pain in your back or your lower abdomen.  You have a fever or chills.  You have nausea or vomiting. Summary  A urinary tract infection (UTI) is an infection of any part of the urinary tract, which includes the kidneys, ureters, bladder, and urethra.  Most urinary tract infections are caused by bacteria in your genital area.  Treatment for this condition often includes antibiotic medicines.  If you were prescribed an antibiotic medicine, take it as told by your health care provider. Do not stop using the antibiotic even if you start to feel better.  Keep all follow-up visits. This is important. This information is not intended to replace advice given to you by your health care provider. Make sure you discuss any questions you have with your health care provider. Document Revised: 02/04/2020 Document Reviewed: 02/04/2020 Elsevier Patient Education  2021 Elsevier Inc.  

## 2020-08-23 LAB — URINALYSIS, MICROSCOPIC ONLY
Casts: NONE SEEN /lpf
Epithelial Cells (non renal): NONE SEEN /hpf (ref 0–10)
WBC, UA: >30 /hpf — AB (ref 0–5)

## 2020-08-24 ENCOUNTER — Ambulatory Visit: Payer: Medicare Other | Admitting: Family Medicine

## 2020-08-24 ENCOUNTER — Ambulatory Visit: Payer: Federal, State, Local not specified - PPO | Admitting: Cardiology

## 2020-08-24 DIAGNOSIS — R296 Repeated falls: Secondary | ICD-10-CM | POA: Diagnosis not present

## 2020-08-24 DIAGNOSIS — R2681 Unsteadiness on feet: Secondary | ICD-10-CM | POA: Diagnosis not present

## 2020-08-24 LAB — URINE CULTURE

## 2020-08-29 DIAGNOSIS — R2681 Unsteadiness on feet: Secondary | ICD-10-CM | POA: Diagnosis not present

## 2020-08-29 DIAGNOSIS — R296 Repeated falls: Secondary | ICD-10-CM | POA: Diagnosis not present

## 2020-08-31 DIAGNOSIS — R2681 Unsteadiness on feet: Secondary | ICD-10-CM | POA: Diagnosis not present

## 2020-08-31 DIAGNOSIS — R296 Repeated falls: Secondary | ICD-10-CM | POA: Diagnosis not present

## 2020-09-01 DIAGNOSIS — M79674 Pain in right toe(s): Secondary | ICD-10-CM | POA: Diagnosis not present

## 2020-09-01 DIAGNOSIS — M2042 Other hammer toe(s) (acquired), left foot: Secondary | ICD-10-CM | POA: Diagnosis not present

## 2020-09-01 DIAGNOSIS — Q6689 Other  specified congenital deformities of feet: Secondary | ICD-10-CM | POA: Diagnosis not present

## 2020-09-01 DIAGNOSIS — L600 Ingrowing nail: Secondary | ICD-10-CM | POA: Diagnosis not present

## 2020-09-01 DIAGNOSIS — M2012 Hallux valgus (acquired), left foot: Secondary | ICD-10-CM | POA: Diagnosis not present

## 2020-09-01 DIAGNOSIS — M2041 Other hammer toe(s) (acquired), right foot: Secondary | ICD-10-CM | POA: Diagnosis not present

## 2020-09-01 DIAGNOSIS — M2011 Hallux valgus (acquired), right foot: Secondary | ICD-10-CM | POA: Diagnosis not present

## 2020-09-01 DIAGNOSIS — M778 Other enthesopathies, not elsewhere classified: Secondary | ICD-10-CM | POA: Diagnosis not present

## 2020-09-01 DIAGNOSIS — M79675 Pain in left toe(s): Secondary | ICD-10-CM | POA: Diagnosis not present

## 2020-09-01 DIAGNOSIS — B351 Tinea unguium: Secondary | ICD-10-CM | POA: Diagnosis not present

## 2020-09-01 DIAGNOSIS — L97521 Non-pressure chronic ulcer of other part of left foot limited to breakdown of skin: Secondary | ICD-10-CM | POA: Diagnosis not present

## 2020-09-05 DIAGNOSIS — R2681 Unsteadiness on feet: Secondary | ICD-10-CM | POA: Diagnosis not present

## 2020-09-05 DIAGNOSIS — R296 Repeated falls: Secondary | ICD-10-CM | POA: Diagnosis not present

## 2020-09-07 DIAGNOSIS — R2681 Unsteadiness on feet: Secondary | ICD-10-CM | POA: Diagnosis not present

## 2020-09-07 DIAGNOSIS — R296 Repeated falls: Secondary | ICD-10-CM | POA: Diagnosis not present

## 2020-09-12 DIAGNOSIS — R296 Repeated falls: Secondary | ICD-10-CM | POA: Diagnosis not present

## 2020-09-12 DIAGNOSIS — R2681 Unsteadiness on feet: Secondary | ICD-10-CM | POA: Diagnosis not present

## 2020-09-14 DIAGNOSIS — R296 Repeated falls: Secondary | ICD-10-CM | POA: Diagnosis not present

## 2020-09-14 DIAGNOSIS — R2681 Unsteadiness on feet: Secondary | ICD-10-CM | POA: Diagnosis not present

## 2020-09-15 ENCOUNTER — Telehealth: Payer: Self-pay

## 2020-09-15 NOTE — Telephone Encounter (Signed)
Copied from [place] [id number]. Topic: Appointment Scheduling - Scheduling Inquiry for Clinic >> Sep 14, 2020  9:03 AM Alanda Slim E wrote: Reason for CRM: Pt is worried that he may have diabetes and he would like dr. B to put in orders for blood work to check/ Pt would like to come in and have the labs done to discuss at his next appt that is scheduled for 3.22.22/please advise

## 2020-09-19 DIAGNOSIS — R296 Repeated falls: Secondary | ICD-10-CM | POA: Diagnosis not present

## 2020-09-19 DIAGNOSIS — R2681 Unsteadiness on feet: Secondary | ICD-10-CM | POA: Diagnosis not present

## 2020-09-19 NOTE — Telephone Encounter (Signed)
Patient advised as below.  

## 2020-09-19 NOTE — Telephone Encounter (Signed)
Just checked 3 motnhs ago and had normal A1c. Can discuss at visit and consider POCT A1c

## 2020-09-21 ENCOUNTER — Other Ambulatory Visit: Payer: Self-pay | Admitting: Family Medicine

## 2020-09-21 DIAGNOSIS — R296 Repeated falls: Secondary | ICD-10-CM | POA: Diagnosis not present

## 2020-09-21 DIAGNOSIS — R2681 Unsteadiness on feet: Secondary | ICD-10-CM | POA: Diagnosis not present

## 2020-09-21 DIAGNOSIS — I10 Essential (primary) hypertension: Secondary | ICD-10-CM

## 2020-09-25 ENCOUNTER — Ambulatory Visit: Payer: Medicare Other | Admitting: Podiatry

## 2020-09-26 ENCOUNTER — Ambulatory Visit: Payer: Medicare Other | Admitting: Family Medicine

## 2020-09-28 DIAGNOSIS — R2681 Unsteadiness on feet: Secondary | ICD-10-CM | POA: Diagnosis not present

## 2020-09-28 DIAGNOSIS — R296 Repeated falls: Secondary | ICD-10-CM | POA: Diagnosis not present

## 2020-09-29 DIAGNOSIS — R296 Repeated falls: Secondary | ICD-10-CM | POA: Diagnosis not present

## 2020-09-29 DIAGNOSIS — R2681 Unsteadiness on feet: Secondary | ICD-10-CM | POA: Diagnosis not present

## 2020-10-03 DIAGNOSIS — R296 Repeated falls: Secondary | ICD-10-CM | POA: Diagnosis not present

## 2020-10-03 DIAGNOSIS — R2681 Unsteadiness on feet: Secondary | ICD-10-CM | POA: Diagnosis not present

## 2020-10-05 ENCOUNTER — Emergency Department
Admission: EM | Admit: 2020-10-05 | Discharge: 2020-10-05 | Disposition: A | Discharge status: Home / Self Care | Payer: Medicare Other | Attending: Emergency Medicine | Admitting: Emergency Medicine

## 2020-10-05 ENCOUNTER — Emergency Department: Payer: Medicare Other

## 2020-10-05 ENCOUNTER — Other Ambulatory Visit: Payer: Self-pay

## 2020-10-05 DIAGNOSIS — R0602 Shortness of breath: Secondary | ICD-10-CM | POA: Diagnosis not present

## 2020-10-05 DIAGNOSIS — E8770 Fluid overload, unspecified: Secondary | ICD-10-CM | POA: Insufficient documentation

## 2020-10-05 DIAGNOSIS — J90 Pleural effusion, not elsewhere classified: Secondary | ICD-10-CM | POA: Diagnosis not present

## 2020-10-05 DIAGNOSIS — I491 Atrial premature depolarization: Secondary | ICD-10-CM | POA: Diagnosis not present

## 2020-10-05 DIAGNOSIS — Z7982 Long term (current) use of aspirin: Secondary | ICD-10-CM | POA: Diagnosis not present

## 2020-10-05 DIAGNOSIS — Z79899 Other long term (current) drug therapy: Secondary | ICD-10-CM | POA: Insufficient documentation

## 2020-10-05 DIAGNOSIS — R52 Pain, unspecified: Secondary | ICD-10-CM | POA: Diagnosis not present

## 2020-10-05 DIAGNOSIS — J9811 Atelectasis: Secondary | ICD-10-CM | POA: Diagnosis not present

## 2020-10-05 DIAGNOSIS — Z8546 Personal history of malignant neoplasm of prostate: Secondary | ICD-10-CM | POA: Insufficient documentation

## 2020-10-05 DIAGNOSIS — I10 Essential (primary) hypertension: Secondary | ICD-10-CM | POA: Diagnosis not present

## 2020-10-05 DIAGNOSIS — R069 Unspecified abnormalities of breathing: Secondary | ICD-10-CM | POA: Diagnosis not present

## 2020-10-05 DIAGNOSIS — Z87891 Personal history of nicotine dependence: Secondary | ICD-10-CM | POA: Insufficient documentation

## 2020-10-05 DIAGNOSIS — I509 Heart failure, unspecified: Secondary | ICD-10-CM | POA: Diagnosis not present

## 2020-10-05 DIAGNOSIS — I517 Cardiomegaly: Secondary | ICD-10-CM | POA: Diagnosis not present

## 2020-10-05 LAB — BASIC METABOLIC PANEL
Anion gap: 9 (ref 5–15)
BUN: 26 mg/dL — ABNORMAL HIGH (ref 8–23)
CO2: 27 mmol/L (ref 22–32)
Calcium: 9.8 mg/dL (ref 8.9–10.3)
Chloride: 103 mmol/L (ref 98–111)
Creatinine, Ser: 0.92 mg/dL (ref 0.61–1.24)
GFR, Estimated: >60 mL/min (ref >60)
Glucose, Bld: 145 mg/dL — ABNORMAL HIGH (ref 70–99)
Potassium: 3.7 mmol/L (ref 3.5–5.1)
Sodium: 139 mmol/L (ref 135–145)

## 2020-10-05 LAB — CBC WITH DIFFERENTIAL/PLATELET
Abs Immature Granulocytes: 0.02 10*3/uL (ref 0.00–0.07)
Basophils Absolute: 0.1 10*3/uL (ref 0.0–0.1)
Basophils Relative: 1 %
Eosinophils Absolute: 0.1 10*3/uL (ref 0.0–0.5)
Eosinophils Relative: 2 %
HCT: 42.5 % (ref 39.0–52.0)
Hemoglobin: 13.8 g/dL (ref 13.0–17.0)
Immature Granulocytes: 0 %
Lymphocytes Relative: 11 %
Lymphs Abs: 0.6 10*3/uL — ABNORMAL LOW (ref 0.7–4.0)
MCH: 30.4 pg (ref 26.0–34.0)
MCHC: 32.5 g/dL (ref 30.0–36.0)
MCV: 93.6 fL (ref 80.0–100.0)
Monocytes Absolute: 0.5 10*3/uL (ref 0.1–1.0)
Monocytes Relative: 10 %
Neutro Abs: 4.1 10*3/uL (ref 1.7–7.7)
Neutrophils Relative %: 76 %
Platelets: 187 10*3/uL (ref 150–400)
RBC: 4.54 MIL/uL (ref 4.22–5.81)
RDW: 14 % (ref 11.5–15.5)
WBC: 5.3 10*3/uL (ref 4.0–10.5)
nRBC: 0 % (ref 0.0–0.2)

## 2020-10-05 LAB — TROPONIN I (HIGH SENSITIVITY)
Troponin I (High Sensitivity): 25 ng/L — ABNORMAL HIGH (ref <18)
Troponin I (High Sensitivity): 33 ng/L — ABNORMAL HIGH (ref <18)

## 2020-10-05 LAB — BRAIN NATRIURETIC PEPTIDE: B Natriuretic Peptide: 521.1 pg/mL — ABNORMAL HIGH (ref 0.0–100.0)

## 2020-10-05 MED ORDER — FUROSEMIDE 20 MG PO TABS: 20.0000 mg | ORAL_TABLET | Freq: Every day | ORAL | 1 refills | Status: DC

## 2020-10-05 MED ORDER — FUROSEMIDE 10 MG/ML IJ SOLN
40.0000 mg | Freq: Once | INTRAMUSCULAR | Status: AC
  Administered 2020-10-05: 40 mg via INTRAVENOUS
  Filled 2020-10-05: qty 4

## 2020-10-05 NOTE — ED Provider Notes (Signed)
[facility] Emergency Department Provider Note   ____________________________________________   I have reviewed the triage vital signs and the nursing notes.   HISTORY  Chief Complaint Shortness of Breath   History limited by: Not Limited   HPI [name] is a 87 y.o. male who presents to the emergency department today because of concerns for shortness of breath.  Patient states he noticed it when he woke up this morning.  Felt like he could only take shallow breaths and was having a hard time taking deep breaths.  He denied any pain in his chest with this however patient states that he does have a cough a lot of times at night.  Wife describes it as a handful of bolts.  He denies any fevers this morning.  Denies any unusual activity yesterday.  Records reviewed. Per medical record review patient has a history of GERD.   Past Medical History:  Diagnosis Date  . Arthritis   . Bladder outlet obstruction   . Erectile dysfunction   . GERD (gastroesophageal reflux disease)   . Hard of hearing   . HBP (high blood pressure)   . History of nephrolithiasis   . HLD (hyperlipidemia)   . Incomplete bladder emptying   . Nocturia   . Osteoporosis   . Prostate cancer ([place])   . Shingles 2014  . Sinus problem   . Stomach ulcer   . Stroke ([place]) 2012  . Urinary frequency   . Urinary leakage     Patient Active Problem List   Diagnosis Date Noted  . Peripheral neuropathy 08/22/2020  . Mitral regurgitation and aortic stenosis 06/27/2020  . Encounter for annual physical exam 06/15/2020  . Murmur, cardiac 06/15/2020  . Recurrent major depressive disorder, in full remission ([place]) 06/15/2020  . Calf swelling 06/15/2020  . Porokeratosis 03/16/2020  . Adjustment disorder with mixed anxiety and depressed mood 06/17/2019  . Memory difficulties 06/17/2019  . Senile purpura ([place]) 05/21/2018  . Frequent falls 04/01/2017  . Constipation 03/31/2017  . Eczema  02/12/2017  . Left leg pain 02/12/2017  . Urinary frequency 09/18/2015  . Allergic rhinitis 06/27/2015  . H/O: CVA (cerebrovascular accident) 06/27/2015  . DD (diverticular disease) 06/27/2015  . Benign essential HTN 06/27/2015  . Hypercholesterolemia 06/27/2015  . Fungal infection of nail 06/27/2015  . Prediabetes 03/15/2015  . Prostate cancer ([place]) 12/18/2014  . Arthritis, degenerative 01/12/2014  . H/O malignant neoplasm of prostate 12/03/2012  . Urge incontinence 11/09/2012  . Obstruction of urinary tract 11/09/2012  . Dermatophytosis of groin 09/29/2012  . 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 Incomplete bladder emptying 09/29/2012    Past Surgical History:  Procedure Laterality Date  . CATARACT EXTRACTION  2000, 2001  . CHOLECYSTECTOMY  1972  . PROSTATE SURGERY  2008/2009  . TONSILLECTOMY    . UMBILICAL HERNIA REPAIR      Prior to Admission medications   Medication Sig Start Date End Date Taking? Authorizing Provider  amLODipine (NORVASC) 5 MG tablet Take 1 tablet by mouth once daily 09/21/20   [name], MD  aspirin EC 81 MG tablet Take 81 mg by mouth daily.    [provider]  fluticasone (FLONASE) 50 MCG/ACT nasal spray Place 1 spray into both nostrils daily. 06/15/20   Bacigalupo, Dionne Bucy, MD  furosemide (LASIX) 20 MG tablet Take 1 tablet (20 mg total) by mouth daily. 08/22/20   [name], MD  gabapentin (NEURONTIN) 100 MG capsule Take  1 capsule (100 mg total) by mouth at bedtime. 08/22/20   [name], MD  Multiple Vitamin (MULTI-VITAMIN) tablet Take 1 tablet by mouth daily.     [provider]  naproxen sodium (ALEVE) 220 MG tablet Take 220 mg by mouth daily as needed (pain/headache).    [provider]  Polyethyl Glycol-Propyl Glycol (SYSTANE OP) Place 1 drop into both eyes daily.     [provider]  Zinc 50 MG TABS Take 50 mg by mouth daily.    [provider]     Allergies Toviaz [fesoterodine fumarate er]  Family History  Problem Relation Age of Onset  . Cancer Mother   . Stroke Father   . Stroke Sister   . Prostate cancer Brother   . Kidney disease Neg Hx   . Kidney cancer Neg Hx   . Bladder Cancer Neg Hx     Social History Social History   Tobacco Use  . Smoking status: Former Smoker    Years: 10.00    Quit date: 07/08/1962    Years since quitting: 58.2  . Smokeless tobacco: Never Used  . Tobacco comment: quit 1964  Vaping Use  . Vaping Use: Never used  Substance Use Topics  . Alcohol use: Yes    Alcohol/week: 0.0 - 1.0 standard drinks  . Drug use: No    Review of Systems Constitutional: No fever/chills Eyes: No visual changes. ENT: No sore throat. Cardiovascular: Denies chest pain. Respiratory: Positive for shortness of breath. Gastrointestinal: No abdominal pain.  No nausea, no vomiting.  No diarrhea.   Genitourinary: Negative for dysuria. Musculoskeletal: Negative for back pain. Skin: Negative for rash. Neurological: Negative for headaches, focal weakness or numbness.  ____________________________________________   PHYSICAL EXAM:  VITAL SIGNS: ED Triage Vitals  Enc Vitals Group     BP 10/05/20 0936 (!) 141/101     Pulse Rate 10/05/20 0936 79     Resp 10/05/20 0936 20     Temp 10/05/20 0936 98.1 F (36.7 C)     Temp Source 10/05/20 0936 Oral     SpO2 10/05/20 0936 95 %     Weight 10/05/20 0940 160 lb (72.6 kg)     Height 10/05/20 0940 5\' 9"  (1.753 m)     Head Circumference --      Peak Flow --      Pain Score 10/05/20 0939 0   Constitutional: Alert and oriented.  Eyes: Conjunctivae are normal.  ENT      Head: Normocephalic and atraumatic.      Nose: No congestion/rhinnorhea.      Mouth/Throat: Mucous membranes are moist.      Neck: No stridor. Hematological/Lymphatic/Immunilogical: No cervical lymphadenopathy. Cardiovascular: Normal rate, regular rhythm.  No murmurs, rubs, or gallops.   Respiratory: Normal respiratory effort without tachypnea nor retractions. Crackles in lung bases.  Gastrointestinal: Soft and non tender. No rebound. No guarding.  Genitourinary: Deferred Musculoskeletal: Normal range of motion in all extremities. Trace bilateral pitting edema.  Neurologic:  Normal speech and language. No gross focal neurologic deficits are appreciated.  Skin:  Skin is warm, dry and intact. No rash noted. Psychiatric: Mood and affect are normal. Speech and behavior are normal. Patient exhibits appropriate insight and judgment.  ____________________________________________    LABS (pertinent positives/negatives)  Trop hs 25 BMP wnl except glu 145, BUN 26 CBC wbc 5.3, hgb 13.8, plt 187  ____________________________________________   EKG  I, Nance Pear, attending physician, personally viewed and interpreted this EKG  EKG Time: 0953 Rate: 71 Rhythm: sinus rhythm Axis: left axis deviation Intervals: qtc 447 QRS: narrow ST changes: no st elevation Impression: abnormal ekg  ____________________________________________    RADIOLOGY  CXR Mild intrastitial edema, cardiomegaly, pleural effusions. No airspace consolidation. ____________________________________________   PROCEDURES  Procedures  ____________________________________________   INITIAL IMPRESSION / ASSESSMENT AND PLAN / ED COURSE  Pertinent labs & imaging results that were available during my care of the patient were reviewed by me and considered in my medical decision making (see chart for details).   Patient presents because of concern for shortness of breath.  On exam patient does have crackles in his bilateral bases.  Chest x-ray is concerning for cardiomegaly and vascular congestion.  Patient was given IV Lasix here with good urine output.  He did feel improvement.  I did discussion with patient about taking 40 mg of Lasix.  He is somewhat unsure if he has Lasix at home so will  provide prescription.  He states he has appointment with primary care tomorrow.  ____________________________________________   FINAL CLINICAL IMPRESSION(S) / ED DIAGNOSES  Final diagnoses:  SOB (shortness of breath)  Hypervolemia, unspecified hypervolemia type     Note: This dictation was prepared with Dragon dictation. Any transcriptional errors that result from this process are unintentional     Nance Pear, MD 10/05/20 1504

## 2020-10-05 NOTE — Discharge Instructions (Addendum)
As we discussed take 2 tablets (40mg  total) of the lasix tomorrow and then discuss with your doctor if they want you to continue on that higher dose for the next couple of days. You have been given a prescription for lasix in case you no longer have any, but please check to make sure that you do not fill it and also take it if you have it already. Please seek medical attention for any high fevers, chest pain, shortness of breath, change in behavior, persistent vomiting, bloody stool or any other new or concerning symptoms.

## 2020-10-05 NOTE — ED Notes (Signed)
D/C and new RX discussed with pt as well as concerns to return to ED, pt and family verbalized understanding. NAD noted. Pt states my breathing feels  "noble". Pt is A&Ox4. VSS.

## 2020-10-05 NOTE — ED Notes (Signed)
Pt has slight expiratory wheeze; also has fine crackles in lower lobes of the lungs

## 2020-10-05 NOTE — ED Triage Notes (Signed)
Pt states that he woke up this morning with sob; BG was 206; pt states that he has had chest congestion and drainage for over 42mo; states his sob woke him up; hx of smoking; hx of a heart murmur; comes from [facility]; denies n/v/d;

## 2020-10-06 ENCOUNTER — Encounter: Payer: Self-pay | Admitting: Family Medicine

## 2020-10-06 ENCOUNTER — Ambulatory Visit (INDEPENDENT_AMBULATORY_CARE_PROVIDER_SITE_OTHER): Payer: Medicare Other | Admitting: Family Medicine

## 2020-10-06 ENCOUNTER — Other Ambulatory Visit: Payer: Self-pay

## 2020-10-06 VITALS — BP 136/71 | Temp 98.6°F | Resp 18 | Ht 69.0 in | Wt 150.6 lb

## 2020-10-06 DIAGNOSIS — I10 Essential (primary) hypertension: Secondary | ICD-10-CM

## 2020-10-06 DIAGNOSIS — E8770 Fluid overload, unspecified: Secondary | ICD-10-CM | POA: Insufficient documentation

## 2020-10-06 DIAGNOSIS — I080 Rheumatic disorders of both mitral and aortic valves: Secondary | ICD-10-CM | POA: Diagnosis not present

## 2020-10-06 DIAGNOSIS — R7303 Prediabetes: Secondary | ICD-10-CM | POA: Diagnosis not present

## 2020-10-06 DIAGNOSIS — L84 Corns and callosities: Secondary | ICD-10-CM | POA: Diagnosis not present

## 2020-10-06 LAB — POCT GLYCOSYLATED HEMOGLOBIN (HGB A1C): Hemoglobin A1C: 5.4 % (ref 4.0–5.6)

## 2020-10-06 NOTE — Progress Notes (Signed)
Established patient visit   Patient: [name]   DOB: 1924/08/09   87 y.o. Male  MRN: [id number] Visit Date: 10/06/2020  Today's healthcare provider: Lavon Paganini, MD   Chief Complaint  Patient presents with  . Follow-up   Subjective    HPI HPI    Pt seen in the ER on yesterday for SOB, Hypervolemia. Diagnose with hypervolemia.    Last edited by Wilson Singer, CMA on 10/06/2020  9:23 AM. (History)     Follow up ER visit  Patient was seen in ER for SOB on 3/31. He was treated for Hypervolemia. Treatment for this included IV Lasix and resume home Lasix 40mg  daily. He reports good compliance with treatment. He reports this condition is Improved.   He is present today with his daughter and wife  He is very concerned about his foot pain that is over his right middle toe and ball of left foot.  He has been seeing podiatry.  There are calluses in these areas.  He does not feel like they are being managed.  Reviewed last podiatry note with patient and he and his wife did not know that they were supposed to be bandaging this or soaking it in Epsom salt.  They would like to consider a second opinion  He is worried he may be eating too much sugar as he has had 3 birthday celebrations with friends in the last week.  He is due for an A1c recheck. -----------------------------------------------------------------------------------------  Patient Active Problem List   Diagnosis Date Noted  . Hypervolemia 10/06/2020  . Foot callus 10/06/2020  . Peripheral neuropathy 08/22/2020  . Mitral regurgitation and aortic stenosis 06/27/2020  . Encounter for annual physical exam 06/15/2020  . Murmur, cardiac 06/15/2020  . Recurrent major depressive disorder, in full remission ([place]) 06/15/2020  . Calf swelling 06/15/2020  . Porokeratosis 03/16/2020  . Adjustment disorder with mixed anxiety and depressed mood 06/17/2019  . Memory difficulties 06/17/2019  . Senile purpura ([place])  05/21/2018  . Frequent falls 04/01/2017  . Constipation 03/31/2017  . Eczema 02/12/2017  . Left leg pain 02/12/2017  . Urinary frequency 09/18/2015  . Allergic rhinitis 06/27/2015  . H/O: CVA (cerebrovascular accident) 06/27/2015  . DD (diverticular disease) 06/27/2015  . Benign essential HTN 06/27/2015  . Hypercholesterolemia 06/27/2015  . Fungal infection of nail 06/27/2015  . Prediabetes 03/15/2015  . Prostate cancer ([place]) 12/18/2014  . Arthritis, degenerative 01/12/2014  . H/O malignant neoplasm of prostate 12/03/2012  . Urge incontinence 11/09/2012  . Obstruction of urinary tract 11/09/2012  . Dermatophytosis of groin 09/29/2012  . 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 Incomplete bladder emptying 09/29/2012   Social History   Tobacco Use  . Smoking status: Former Smoker    Years: 10.00    Quit date: 07/08/1962    Years since quitting: 58.2  . Smokeless tobacco: Never Used  . Tobacco comment: quit 1964  Vaping Use  . Vaping Use: Never used  Substance Use Topics  . Alcohol use: Yes    Alcohol/week: 1.0 - 2.0 standard drink    Types: 1 Glasses of wine per week    Comment: every 2-3 mths, 1 glass of wine daily    . Drug use: No   Allergies  Allergen Reactions  . Toviaz [Fesoterodine Fumarate Er] Other (See Comments)    Acid reflux       Medications: Outpatient Medications Prior to Visit  Medication Sig  . amLODipine (NORVASC)  5 MG tablet Take 1 tablet by mouth once daily  . aspirin EC 81 MG tablet Take 81 mg by mouth daily.  . fluticasone (FLONASE) 50 MCG/ACT nasal spray Place 1 spray into both nostrils daily.  . furosemide (LASIX) 20 MG tablet Take 1 tablet (20 mg total) by mouth daily.  . furosemide (LASIX) 20 MG tablet Take 1 tablet (20 mg total) by mouth daily.  [name] gabapentin (NEURONTIN) 100 MG capsule Take 1 capsule (100 mg total) by mouth at bedtime.  . Multiple Vitamin (MULTI-VITAMIN) tablet Take 1 tablet by mouth daily.   . naproxen  sodium (ALEVE) 220 MG tablet Take 220 mg by mouth daily as needed (pain/headache).  Vladimir Faster Glycol-Propyl Glycol (SYSTANE OP) Place 1 drop into both eyes daily.   . Zinc 50 MG TABS Take 50 mg by mouth daily.   No facility-administered medications prior to visit.    Review of Systems -per HPI.  Also no chest pain, fever, abdominal pain  Last CBC Lab Results  Component Value Date   WBC 5.3 10/05/2020   HGB 13.8 10/05/2020   HCT 42.5 10/05/2020   MCV 93.6 10/05/2020   MCH 30.4 10/05/2020   RDW 14.0 10/05/2020   PLT 187 16/04/9603   Last metabolic panel Lab Results  Component Value Date   GLUCOSE 145 (H) 10/05/2020   NA 139 10/05/2020   K 3.7 10/05/2020   CL 103 10/05/2020   CO2 27 10/05/2020   BUN 26 (H) 10/05/2020   CREATININE 0.92 10/05/2020   GFRNONAA >60 10/05/2020   GFRAA 71 06/27/2020   CALCIUM 9.8 10/05/2020   PROT 6.5 06/09/2020   ALBUMIN 4.3 06/09/2020   LABGLOB 2.2 06/09/2020   AGRATIO 2.0 06/09/2020   BILITOT 0.8 06/09/2020   ALKPHOS 69 06/09/2020   AST 32 06/09/2020   ALT 21 06/09/2020   ANIONGAP 9 10/05/2020   Last lipids Lab Results  Component Value Date   CHOL 176 06/09/2020   HDL 73 06/09/2020   LDLCALC 91 06/09/2020   TRIG 65 06/09/2020   CHOLHDL 2.4 06/09/2020   Last hemoglobin A1c Lab Results  Component Value Date   HGBA1C 5.4 10/06/2020   Last thyroid functions Lab Results  Component Value Date   TSH 2.640 06/09/2020   Last vitamin D Lab Results  Component Value Date   VD25OH 36.8 11/20/2017       Objective    BP 136/71 (BP Location: Left Arm, Patient Position: Sitting, Cuff Size: Normal)   Temp 98.6 F (37 C) (Oral)   Resp 18   Ht 5\' 9"  (1.753 m)   Wt 150 lb 9.6 oz (68.3 kg)   SpO2 99%   BMI 22.24 kg/m  BP Readings from Last 3 Encounters:  10/06/20 136/71  10/05/20 (!) 141/77  08/22/20 (!) 164/94   Wt Readings from Last 3 Encounters:  10/06/20 150 lb 9.6 oz (68.3 kg)  10/05/20 160 lb (72.6 kg)  08/22/20 154  lb (69.9 kg)      Physical Exam Vitals reviewed.  Constitutional:      General: He is not in acute distress.    Appearance: Normal appearance. He is not diaphoretic.  HENT:     Head: Normocephalic and atraumatic.  Eyes:     General: No scleral icterus.    Conjunctiva/sclera: Conjunctivae normal.  Cardiovascular:     Rate and Rhythm: Normal rate and regular rhythm.     Pulses: Normal pulses.     Heart sounds: Murmur heard.  Pulmonary:     Effort: Pulmonary effort is normal. No respiratory distress.     Breath sounds: Normal breath sounds. No wheezing or rhonchi.  Abdominal:     General: There is no distension.     Palpations: Abdomen is soft.     Tenderness: There is no abdominal tenderness.  Musculoskeletal:     Cervical back: Neck supple.     Right lower leg: Edema present.     Left lower leg: Edema present.     Comments: Callus on plantar surface of ball of left foot and tip of right middle toe  Lymphadenopathy:     Cervical: No cervical adenopathy.  Skin:    General: Skin is warm and dry.     Capillary Refill: Capillary refill takes less than 2 seconds.     Findings: No rash.  Neurological:     Mental Status: He is alert and oriented to person, place, and time.     Cranial Nerves: No cranial nerve deficit.  Psychiatric:        Mood and Affect: Mood normal.        Behavior: Behavior normal.      Results for orders placed or performed in visit on 10/06/20  POCT glycosylated hemoglobin (Hb A1C)  Result Value Ref Range   Hemoglobin A1C 5.4 4.0 - 5.6 %   HbA1c POC (<> result, manual entry)     HbA1c, POC (prediabetic range)     HbA1c, POC (controlled diabetic range)      Assessment & Plan     Problem List Items Addressed This Visit      Cardiovascular and Mediastinum   Benign essential HTN    Well-controlled today Continue current medications Reviewed recent metabolic panel      Mitral regurgitation and aortic stenosis - Primary    Likely cause of  his hypervolemia He did not tolerate torsemide due to dehydration Seems that Lasix 20 mg daily was an insufficient dose given recent ER visit for hypervolemia Crackles have resolved today, but he does have some lower extremity edema Discussed we will use today's weight is his dry weight and check daily weights If he gains 2 pounds in a day or 5 pounds in a week, he should call for additional instructions Continue Lasix 40 mg daily at this time Follow-up in 2 weeks to reassess fluid status and recheck metabolic panel        Musculoskeletal and Integument   Foot callus    Calluses seem to be the cause of his foot pain He is followed by podiatry, but would like a second opinion Referral placed to [facility] today Encouraged him to discuss his issue with the podiatrist and have them focus on his calluses and not his toenails at his next visit Recommend soaking in Epsom salt daily until that appointment      Relevant Orders   Ambulatory referral to Podiatry     Other   Prediabetes    Chronic and stable with normal A1c today Reassured about recent sugar intake      Relevant Orders   POCT glycosylated hemoglobin (Hb A1C) (Completed)   Hypervolemia    Plan as per above for mitral regurg and aortic stenosis causing hypervolemia Improving today          Return in about 2 weeks (around 10/20/2020) for chronic disease f/u.      Total time spent on today's visit was greater than 40 minutes, including both face-to-face time  and nonface-to-face time personally spent on review of chart (labs and imaging), discussing labs and goals, discussing further work-up, treatment options, referrals to specialist if needed, reviewing outside records of pertinent, answering patient's questions, and coordinating care.   I, Lavon Paganini, MD, have reviewed all documentation for this visit. The documentation on 10/06/20 for the exam, diagnosis, procedures, and orders are all accurate and  complete.   Caty Tessler, Dionne Bucy, MD, MPH [facility]

## 2020-10-06 NOTE — Assessment & Plan Note (Signed)
Well-controlled today Continue current medications Reviewed recent metabolic panel

## 2020-10-06 NOTE — Assessment & Plan Note (Signed)
Calluses seem to be the cause of his foot pain He is followed by podiatry, but would like a second opinion Referral placed to [facility] today Encouraged him to discuss his issue with the podiatrist and have them focus on his calluses and not his toenails at his next visit Recommend soaking in Epsom salt daily until that appointment

## 2020-10-06 NOTE — Assessment & Plan Note (Signed)
Likely cause of his hypervolemia He did not tolerate torsemide due to dehydration Seems that Lasix 20 mg daily was an insufficient dose given recent ER visit for hypervolemia Crackles have resolved today, but he does have some lower extremity edema Discussed we will use today's weight is his dry weight and check daily weights If he gains 2 pounds in a day or 5 pounds in a week, he should call for additional instructions Continue Lasix 40 mg daily at this time Follow-up in 2 weeks to reassess fluid status and recheck metabolic panel

## 2020-10-06 NOTE — Assessment & Plan Note (Signed)
Plan as per above for mitral regurg and aortic stenosis causing hypervolemia Improving today

## 2020-10-06 NOTE — Assessment & Plan Note (Signed)
Chronic and stable with normal A1c today Reassured about recent sugar intake

## 2020-10-10 DIAGNOSIS — R2681 Unsteadiness on feet: Secondary | ICD-10-CM | POA: Diagnosis not present

## 2020-10-10 DIAGNOSIS — R296 Repeated falls: Secondary | ICD-10-CM | POA: Diagnosis not present

## 2020-10-12 DIAGNOSIS — R2681 Unsteadiness on feet: Secondary | ICD-10-CM | POA: Diagnosis not present

## 2020-10-12 DIAGNOSIS — R296 Repeated falls: Secondary | ICD-10-CM | POA: Diagnosis not present

## 2020-10-13 ENCOUNTER — Ambulatory Visit: Payer: Medicare Other | Admitting: Podiatry

## 2020-10-17 ENCOUNTER — Ambulatory Visit: Payer: Medicare Other | Admitting: Podiatry

## 2020-10-17 DIAGNOSIS — R296 Repeated falls: Secondary | ICD-10-CM | POA: Diagnosis not present

## 2020-10-17 DIAGNOSIS — R2681 Unsteadiness on feet: Secondary | ICD-10-CM | POA: Diagnosis not present

## 2020-10-19 DIAGNOSIS — R296 Repeated falls: Secondary | ICD-10-CM | POA: Diagnosis not present

## 2020-10-19 DIAGNOSIS — R2681 Unsteadiness on feet: Secondary | ICD-10-CM | POA: Diagnosis not present

## 2020-10-24 ENCOUNTER — Ambulatory Visit: Payer: Medicare Other | Admitting: Family Medicine

## 2020-10-24 ENCOUNTER — Telehealth: Payer: Self-pay

## 2020-10-24 ENCOUNTER — Other Ambulatory Visit: Payer: Self-pay

## 2020-10-24 ENCOUNTER — Encounter: Payer: Medicare Other | Admitting: Podiatry

## 2020-10-24 DIAGNOSIS — R2681 Unsteadiness on feet: Secondary | ICD-10-CM | POA: Diagnosis not present

## 2020-10-24 DIAGNOSIS — R296 Repeated falls: Secondary | ICD-10-CM | POA: Diagnosis not present

## 2020-10-24 NOTE — Telephone Encounter (Signed)
Copied from [place] [id number]. Topic: General - Other >> Oct 24, 2020  4:00 PM Antonieta Iba C wrote: Reason for CRM: pt's daughter [name] is calling in for assistance. She just learned that pt's apt was rescheduled. She has a few concerns that she wanted to discuss with provider that would help with pt's follow up.   [name] says that pt is currently suppose to weigh hisself to make sure that he isn't having fluid build up. Currently pt is unable to get up on the scale by himself. Daughter would like to know if possible could provider okay OT orders to help pt? Daughter says that pt currently have PT orders. Pt is currently a [employer].

## 2020-10-25 NOTE — Telephone Encounter (Signed)
That's fine

## 2020-10-26 DIAGNOSIS — R2681 Unsteadiness on feet: Secondary | ICD-10-CM | POA: Diagnosis not present

## 2020-10-26 DIAGNOSIS — R296 Repeated falls: Secondary | ICD-10-CM | POA: Diagnosis not present

## 2020-10-26 NOTE — Telephone Encounter (Signed)
Pt's daughter Silvano Rusk called back saying to cancel the request.  She said her father changed his mind.

## 2020-10-31 ENCOUNTER — Telehealth: Payer: Self-pay

## 2020-10-31 DIAGNOSIS — R2681 Unsteadiness on feet: Secondary | ICD-10-CM | POA: Diagnosis not present

## 2020-10-31 DIAGNOSIS — R296 Repeated falls: Secondary | ICD-10-CM | POA: Diagnosis not present

## 2020-10-31 MED ORDER — FUROSEMIDE 40 MG PO TABS: 40.0000 mg | ORAL_TABLET | Freq: Every day | ORAL | 3 refills | Status: DC

## 2020-10-31 NOTE — Progress Notes (Signed)
Patient left without being seen today This encounter was created in error - please disregard.

## 2020-10-31 NOTE — Telephone Encounter (Signed)
Please send in new Rx with new dose and quantity. See my last note

## 2020-10-31 NOTE — Telephone Encounter (Signed)
Please review patient request below. KW 

## 2020-10-31 NOTE — Telephone Encounter (Signed)
Copied from [place] [id number]. Topic: General - Other >> Oct 31, 2020 10:30 AM Tessa Lerner A wrote: Reason for CRM: Patient's wife has made contact regarding patient's furosemide (LASIX) 20 MG tablet prescription  Patient visited the ED on 10/05/20 and directed by staff to increase the amount of the medication they were taking  Due to the increased amount being taken, the patient has ran out of the medication earlier than expected  Patient's pharmacy has notified the patient that their insurance company will not be able to cover the medication until 11/09/20  Patient('s wife) is requesting a new prescription with an increased quantity to keep this problem from persisting  Please contact to further advise when possible

## 2020-10-31 NOTE — Addendum Note (Signed)
Addended by: Minette Headland on: 10/31/2020 03:23 PM   Modules accepted: Orders

## 2020-11-27 ENCOUNTER — Other Ambulatory Visit: Payer: Self-pay

## 2020-11-27 ENCOUNTER — Ambulatory Visit (INDEPENDENT_AMBULATORY_CARE_PROVIDER_SITE_OTHER): Payer: Medicare Other | Admitting: Family Medicine

## 2020-11-27 ENCOUNTER — Encounter: Payer: Self-pay | Admitting: Family Medicine

## 2020-11-27 ENCOUNTER — Ambulatory Visit: Payer: Medicare Other | Admitting: Family Medicine

## 2020-11-27 VITALS — BP 136/56 | HR 89 | Temp 98.3°F | Resp 16 | Ht 65.0 in | Wt 151.5 lb

## 2020-11-27 DIAGNOSIS — I10 Essential (primary) hypertension: Secondary | ICD-10-CM | POA: Diagnosis not present

## 2020-11-27 DIAGNOSIS — I080 Rheumatic disorders of both mitral and aortic valves: Secondary | ICD-10-CM

## 2020-11-27 DIAGNOSIS — G6289 Other specified polyneuropathies: Secondary | ICD-10-CM

## 2020-11-27 DIAGNOSIS — D692 Other nonthrombocytopenic purpura: Secondary | ICD-10-CM

## 2020-11-27 MED ORDER — FLUTICASONE PROPIONATE 50 MCG/ACT NA SUSP: 1.0000 | Freq: Every day | NASAL | 5 refills | Status: AC

## 2020-11-27 NOTE — Progress Notes (Signed)
Established patient visit   Patient: [name]   DOB: 1925/04/09   87 y.o. Male  MRN: [id number] Visit Date: 11/27/2020  Today's healthcare provider: Lavon Paganini, MD   Chief Complaint  Patient presents with  . Follow-up   Subjective    HPI  Follow up for Swelling  The patient was last seen for this 2 months ago. Changes made at last visit include continue Lasix 40mg  daily.  He reports excellent compliance with treatment. He feels that condition is Unchanged. He is not having side effects.   -----------------------------------------------------------------------------------------   Patient Active Problem List   Diagnosis Date Noted  . Hypervolemia 10/06/2020  . Foot callus 10/06/2020  . Peripheral neuropathy 08/22/2020  . Mitral regurgitation and aortic stenosis 06/27/2020  . Encounter for annual physical exam 06/15/2020  . Murmur, cardiac 06/15/2020  . Recurrent major depressive disorder, in full remission ([place]) 06/15/2020  . Calf swelling 06/15/2020  . Porokeratosis 03/16/2020  . Adjustment disorder with mixed anxiety and depressed mood 06/17/2019  . Memory difficulties 06/17/2019  . Senile purpura ([place]) 05/21/2018  . Frequent falls 04/01/2017  . Constipation 03/31/2017  . Eczema 02/12/2017  . Left leg pain 02/12/2017  . Urinary frequency 09/18/2015  . Allergic rhinitis 06/27/2015  . H/O: CVA (cerebrovascular accident) 06/27/2015  . DD (diverticular disease) 06/27/2015  . Benign essential HTN 06/27/2015  . Hypercholesterolemia 06/27/2015  . Fungal infection of nail 06/27/2015  . Prediabetes 03/15/2015  . Prostate cancer ([place]) 12/18/2014  . Arthritis, degenerative 01/12/2014  . H/O malignant neoplasm of prostate 12/03/2012  . Urge incontinence 11/09/2012  . Obstruction of urinary tract 11/09/2012  . Dermatophytosis of groin 09/29/2012  . 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 Incomplete bladder emptying 09/29/2012    Social History   Tobacco Use  . Smoking status: Former Smoker    Years: 10.00    Quit date: 07/08/1962    Years since quitting: 58.4  . Smokeless tobacco: Never Used  . Tobacco comment: quit 1964  Vaping Use  . Vaping Use: Never used  Substance Use Topics  . Alcohol use: Yes    Alcohol/week: 1.0 - 2.0 standard drink    Types: 1 Glasses of wine per week    Comment: every 2-3 mths, 1 glass of wine daily    . Drug use: No   Allergies  Allergen Reactions  . Toviaz [Fesoterodine Fumarate Er] Other (See Comments)    Acid reflux       Medications: Outpatient Medications Prior to Visit  Medication Sig  . amLODipine (NORVASC) 5 MG tablet Take 1 tablet by mouth once daily  . aspirin EC 81 MG tablet Take 81 mg by mouth daily.  . furosemide (LASIX) 40 MG tablet Take 1 tablet (40 mg total) by mouth daily.  [name] gabapentin (NEURONTIN) 100 MG capsule Take 1 capsule (100 mg total) by mouth at bedtime.  . Multiple Vitamin (MULTI-VITAMIN) tablet Take 1 tablet by mouth daily.   . naproxen sodium (ALEVE) 220 MG tablet Take 220 mg by mouth daily as needed (pain/headache).  Vladimir Faster Glycol-Propyl Glycol (SYSTANE OP) Place 1 drop into both eyes daily.   . [DISCONTINUED] fluticasone (FLONASE) 50 MCG/ACT nasal spray Place 1 spray into both nostrils daily.  . [DISCONTINUED] Zinc 50 MG TABS Take 50 mg by mouth daily.  . [DISCONTINUED] furosemide (LASIX) 20 MG tablet Take 1 tablet (20 mg total) by mouth daily. (Patient not taking: Reported on 11/27/2020)   No  facility-administered medications prior to visit.    Review of Systems  Constitutional: Negative for activity change, appetite change, chills, fatigue and fever.  HENT: Negative for congestion, ear pain, rhinorrhea, sinus pain and sore throat.   Respiratory: Negative for cough, chest tightness, shortness of breath and wheezing.   Cardiovascular: Negative for chest pain, palpitations and leg swelling.  Gastrointestinal: Negative for abdominal  pain, blood in stool, diarrhea, nausea and vomiting.  Genitourinary: Negative for dysuria, flank pain, frequency and urgency.  Neurological: Negative for dizziness and headaches.       Objective    BP (!) 136/56 (BP Location: Right Arm, Patient Position: Sitting, Cuff Size: Normal)   Pulse 89   Temp 98.3 F (36.8 C) (Oral)   Resp 16   Ht 5\' 5"  (1.651 m)   Wt 151 lb 8 oz (68.7 kg)   SpO2 98%   BMI 25.21 kg/m  BP Readings from Last 3 Encounters:  11/27/20 (!) 136/56  10/06/20 136/71  10/05/20 (!) 141/77   Wt Readings from Last 3 Encounters:  11/27/20 151 lb 8 oz (68.7 kg)  10/06/20 150 lb 9.6 oz (68.3 kg)  10/05/20 160 lb (72.6 kg)       Physical Exam Vitals reviewed.  Constitutional:      General: He is not in acute distress.    Appearance: Normal appearance. He is not diaphoretic.  HENT:     Head: Normocephalic and atraumatic.  Eyes:     General: No scleral icterus.    Conjunctiva/sclera: Conjunctivae normal.  Cardiovascular:     Rate and Rhythm: Normal rate and regular rhythm.     Pulses: Normal pulses.     Heart sounds: Murmur heard.    Pulmonary:     Effort: Pulmonary effort is normal. No respiratory distress.     Breath sounds: Normal breath sounds. No wheezing or rhonchi.  Abdominal:     General: There is no distension.     Palpations: Abdomen is soft.     Tenderness: There is no abdominal tenderness.  Musculoskeletal:     Cervical back: Neck supple.     Right lower leg: Edema (trace) present.     Left lower leg: Edema (trace) present.  Lymphadenopathy:     Cervical: No cervical adenopathy.  Skin:    General: Skin is warm and dry.     Capillary Refill: Capillary refill takes less than 2 seconds.     Findings: No rash.  Neurological:     Mental Status: He is alert and oriented to person, place, and time.     Cranial Nerves: No cranial nerve deficit.  Psychiatric:        Mood and Affect: Mood normal.        Behavior: Behavior normal.     No  results found for any visits on 11/27/20.  Assessment & Plan     Problem List Items Addressed This Visit      Cardiovascular and Mediastinum   Benign essential HTN    Well-controlled today Continue current medications Reviewed recent metabolic panel      Senile purpura ([facility])    Stable      Mitral regurgitation and aortic stenosis - Primary    Euvolemic and asymptomatic today Did not tolerate torsemide due to dehydration Continue Lasix 40 mg daily Reviewed last metabolic panel/potassium Seems to be at his dry weight Continue checking daily weights Upcoming repeat echo already scheduled        Nervous and Auditory  Peripheral neuropathy    Chronic and well-controlled Continue gabapentin 100 mg nightly          Return in about 3 months (around 02/27/2021) for chronic disease f/u.      Frederic Jericho [place],acting as a Education administrator for Lavon Paganini, MD.,have documented all relevant documentation on the behalf of Lavon Paganini, MD,as directed by  Lavon Paganini, MD while in the presence of Lavon Paganini, MD.   I, Lavon Paganini, MD, have reviewed all documentation for this visit. The documentation on 11/28/20 for the exam, diagnosis, procedures, and orders are all accurate and complete.   Celita Aron, Dionne Bucy, MD, MPH [facility]

## 2020-11-28 NOTE — Assessment & Plan Note (Signed)
Chronic and well-controlled Continue gabapentin 100 mg nightly

## 2020-11-28 NOTE — Assessment & Plan Note (Signed)
Euvolemic and asymptomatic today Did not tolerate torsemide due to dehydration Continue Lasix 40 mg daily Reviewed last metabolic panel/potassium Seems to be at his dry weight Continue checking daily weights Upcoming repeat echo already scheduled

## 2020-11-28 NOTE — Assessment & Plan Note (Signed)
Stable

## 2020-11-28 NOTE — Assessment & Plan Note (Signed)
Well-controlled today Continue current medications Reviewed recent metabolic panel

## 2020-12-01 ENCOUNTER — Telehealth: Payer: Self-pay | Admitting: Family Medicine

## 2020-12-01 NOTE — Telephone Encounter (Signed)
Pt seen dr b on 11-27-2020 and pt wife is calling and would like to know if dr b could prescribe her husband with buspirone for his anxiety. [facility] [address] in [place]. Phone number [phone]

## 2020-12-05 MED ORDER — BUSPIRONE HCL 5 MG PO TABS: 5.0000 mg | ORAL_TABLET | Freq: Two times a day (BID) | ORAL | 2 refills | Status: DC

## 2020-12-05 NOTE — Telephone Encounter (Signed)
If he is feeling anxious, we could try it. Ok to send in buspar 5mg  BID #60 [id number]. Thanks!

## 2020-12-05 NOTE — Addendum Note (Signed)
Addended by: Shawna Orleans on: 12/05/2020 08:41 AM   Modules accepted: Orders

## 2020-12-22 ENCOUNTER — Other Ambulatory Visit: Payer: Self-pay | Admitting: Family Medicine

## 2020-12-22 ENCOUNTER — Ambulatory Visit (INDEPENDENT_AMBULATORY_CARE_PROVIDER_SITE_OTHER): Payer: Medicare Other

## 2020-12-22 ENCOUNTER — Other Ambulatory Visit: Payer: Self-pay

## 2020-12-22 DIAGNOSIS — I10 Essential (primary) hypertension: Secondary | ICD-10-CM

## 2020-12-22 DIAGNOSIS — I350 Nonrheumatic aortic (valve) stenosis: Secondary | ICD-10-CM

## 2020-12-22 DIAGNOSIS — I340 Nonrheumatic mitral (valve) insufficiency: Secondary | ICD-10-CM

## 2020-12-23 LAB — ECHOCARDIOGRAM COMPLETE
AR max vel: 2.71 cm2
AV Area VTI: 2.32 cm2
AV Area mean vel: 2.44 cm2
AV Mean grad: 2 mmHg
AV Peak grad: 3.8 mmHg
Ao pk vel: 0.97 m/s
Area-P 1/2: 4.08 cm2
S' Lateral: 2.6 cm
Single Plane A4C EF: 54.8 %

## 2020-12-28 ENCOUNTER — Telehealth: Payer: Self-pay

## 2020-12-28 NOTE — Telephone Encounter (Signed)
NA x1; unable to leave a detailed message on answering machine per the DPR doesn't recommend leaving a detailed message on voice mail.

## 2021-01-07 ENCOUNTER — Other Ambulatory Visit: Payer: Self-pay | Admitting: Family Medicine

## 2021-01-07 NOTE — Telephone Encounter (Signed)
Requested Prescriptions  Pending Prescriptions Disp Refills  . gabapentin (NEURONTIN) 100 MG capsule [Pharmacy Med Name: Gabapentin 100 MG Oral Capsule] 30 capsule 2    Sig: Take 1 capsule by mouth at bedtime     Neurology: Anticonvulsants - gabapentin Passed - 01/07/2021  6:31 AM      Passed - Valid encounter within last 12 months    Recent Outpatient Visits          1 month ago Mitral regurgitation and aortic stenosis   [facility] [place], Dionne Bucy, MD   3 months ago Mitral regurgitation and aortic stenosis   [facility], Dionne Bucy, MD   4 months ago Cystitis with hematuria   [facility] [place], Dionne Bucy, MD   6 months ago Mitral regurgitation and aortic stenosis   [facility], Dionne Bucy, MD   6 months ago Murmur, cardiac   [facility] [place], Dionne Bucy, MD      Future Appointments            In 1 month [place], Dionne Bucy, MD [facility], [place]

## 2021-01-18 ENCOUNTER — Encounter: Payer: Self-pay | Admitting: Podiatry

## 2021-01-18 ENCOUNTER — Other Ambulatory Visit: Payer: Self-pay

## 2021-01-18 ENCOUNTER — Ambulatory Visit (INDEPENDENT_AMBULATORY_CARE_PROVIDER_SITE_OTHER): Payer: Medicare Other | Admitting: Podiatry

## 2021-01-18 DIAGNOSIS — M79676 Pain in unspecified toe(s): Secondary | ICD-10-CM

## 2021-01-18 DIAGNOSIS — L84 Corns and callosities: Secondary | ICD-10-CM | POA: Diagnosis not present

## 2021-01-18 DIAGNOSIS — B351 Tinea unguium: Secondary | ICD-10-CM | POA: Diagnosis not present

## 2021-01-18 NOTE — Progress Notes (Signed)
This patient returns to the office for evaluation and treatment of long thick painful nails .  This patient is unable to trim his own nails since the patient cannot reach his feet.  Patient says the nails are painful walking and wearing his shoes. Patient also has painful callus on his left forefoot.   He returns for preventive foot care services. Patient presents to the office with his wife.  General Appearance  Alert, conversant and in no acute stress.  Vascular  Dorsalis pedis and posterior tibial  pulses are  weakly palpable  bilaterally.  Capillary return is within normal limits  bilaterally. Cold feet  bilaterally. Absent digital hair  B/L.  Neurologic  Senn-Weinstein monofilament wire test within normal limits  bilaterally. Muscle power within normal limits bilaterally.  Nails Thick disfigured discolored nails with subungual debris  from hallux to fifth toes bilaterally. No evidence of bacterial infection or drainage bilaterally.  Orthopedic  No limitations of motion  feet .  No crepitus or effusions noted.  No bony pathology or digital deformities noted.  HAV 1st MPJ  Left foot.  Hammer toe second left foot.  Skin  normotropic skin  noted bilaterally.  No signs of infections or ulcers noted.   Porokeratosis left foot.  Dried  hematoma has developed distal callus left forefoot.  Onychomycosis  Pain in toes right foot  Pain in toes left foot  Debride porokeratosis left forefoot.  Debridement  of nails  1-5  B/L with a nail nipper.  Nails were then filed using a dremel tool with no incidents.  Debridement of callus left forefoot with # 15 blade.  Dried blood blister was evacuated.  Neosporin/DSD.  Padding applied to shoe insole.  RTC 3 weeks callus check.    RTC 3 months for preventative foot care services.  Gardiner Barefoot DPM

## 2021-01-24 ENCOUNTER — Telehealth: Payer: Self-pay | Admitting: Cardiology

## 2021-01-24 NOTE — Telephone Encounter (Signed)
Patient returning a call.  No notes found.  Patient understands if needed someone will attempt to reach him again.

## 2021-02-08 ENCOUNTER — Encounter: Payer: Self-pay | Admitting: Podiatry

## 2021-02-08 ENCOUNTER — Ambulatory Visit (INDEPENDENT_AMBULATORY_CARE_PROVIDER_SITE_OTHER): Payer: Medicare Other | Admitting: Podiatry

## 2021-02-08 ENCOUNTER — Other Ambulatory Visit: Payer: Self-pay

## 2021-02-08 DIAGNOSIS — L84 Corns and callosities: Secondary | ICD-10-CM | POA: Diagnosis not present

## 2021-02-08 DIAGNOSIS — L989 Disorder of the skin and subcutaneous tissue, unspecified: Secondary | ICD-10-CM

## 2021-02-08 NOTE — Progress Notes (Signed)
This patient returns to my office for at risk foot care.  This patient requires this care by a professional since this patient will be at risk due to having peripheral neuropathy.    This patient has developed an ulcerous callus/hematoma left forefoot.  He has been treating this skin lesion at home and there is no drainage.  He presents to the office wearing bandages. This patient presents for at risk foot care today.  General Appearance  Alert, conversant and in no acute stress.  Vascular  Dorsalis pedis and posterior tibial  pulses are weakly  palpable  bilaterally.  Capillary return is within normal limits  bilaterally. Temperature is within normal limits  bilaterally.  Neurologic  Senn-Weinstein monofilament wire test within normal limits  bilaterally. Muscle power within normal limits bilaterally.  Nails Thick disfigured discolored nails with subungual debris  from hallux to fifth toes bilaterally. No evidence of bacterial infection or drainage bilaterally.  Orthopedic  No limitations of motion  feet .  No crepitus or effusions noted.  No bony pathology or digital deformities noted.  HAV 1st MPJ left foot.  Hammer toe second toe left foot. Plantar flexed second metatarsal left foot.  Skin  normotropic skin with no porokeratosis noted bilaterally.  No signs of infections or ulcers noted.     Ulcerous callus left forefoot.  Consent was obtained for treatment procedures.    Filed with dremel without incident at the site of the ulcer..    Return office visit    10 weeks                 Told patient to return for periodic foot care and evaluation due to potential at risk complications.   Gardiner Barefoot DPM

## 2021-02-21 ENCOUNTER — Ambulatory Visit: Payer: Self-pay | Admitting: *Deleted

## 2021-02-21 NOTE — Telephone Encounter (Signed)
Noticed blood-tinged urine for the last 3 days. Today urine appears much lighter, pink-tinged. Denies dysuria, fever, abdomen or back pain, N/V. No bloody clots/strings seen in urine. Voiding around 3x/daily. Stated he has had to massage his penis for years to get his stream started but also feels an urgency when he has to void. LBM 02/20/21.Disposition is to be seen within 24 hours-no availability. He has appointment on 02/27/21. Reviewed with patient and his wife urgent symptoms if presented-take him to UC for evaluation. Both voiced understanding information. Encouraged at least 16oz water each day.    Reason for Disposition  Blood in urine  (Exception: could be normal menstrual bleeding)  Answer Assessment - Initial Assessment Questions 1. COLOR of URINE: "Describe the color of the urine."  (e.g., tea-colored, pink, red, blood clots, bloody)     Pink tinged urine. 2. ONSET: "When did the bleeding start?"     3 days ago 3. EPISODES: "How many times has there been blood in the urine?" or "How many times today?"     Several times a day 4. PAIN with URINATION: "Is there any pain with passing your urine?" If Yes, ask: "How bad is the pain?"  (Scale 1-10; or mild, moderate, severe)    - MILD - complains slightly about urination hurting    - MODERATE - interferes with normal activities      - SEVERE - excruciating, unwilling or unable to urinate because of the pain      Unable to say.  5. FEVER: "Do you have a fever?" If Yes, ask: "What is your temperature, how was it measured, and when did it start?"     no 6. ASSOCIATED SYMPTOMS: "Are you passing urine more frequently than usual?"     no 7. OTHER SYMPTOMS: "Do you have any other symptoms?" (e.g., back/flank pain, abdominal pain, vomiting)     none 8. PREGNANCY: "Is there any chance you are pregnant?" "When was your last menstrual period?"     na  Protocols used: Urine - Blood In-A-AH

## 2021-02-21 NOTE — Telephone Encounter (Signed)
FYI... Pt as an appointment 08/23  Thanks,   -Mickel Baas

## 2021-02-21 NOTE — Telephone Encounter (Signed)
Noted  

## 2021-02-23 ENCOUNTER — Telehealth: Payer: Self-pay | Admitting: Cardiology

## 2021-02-23 ENCOUNTER — Ambulatory Visit: Payer: Self-pay | Admitting: *Deleted

## 2021-02-23 NOTE — Telephone Encounter (Signed)
Patient called to discuss the blood in his urine. He says after his wife called he urinated and the urine was clear. He says there was a shadow in the bottom of the toilet that the light shined down on and made the color look off, but he dipped the urine out in a bowl and it was clear without blood. He denies any other symptoms. I advised to keep the appointment already scheduled from the 02/21/21 encounter and to monitor the urine for return of blood this weekend, encouraged increased fluid intake, advised UC or ED if symptoms worsen over the weekend, patient verbalized understanding.    Message from Bejou Callas sent at 02/23/2021  9:15 AM EDT  Pt's wife called saying [name] still has blood in his urine and it is orange in color.  I could not get an earlier appt at the office.  He has no fever or real pain.  He also has had a cough for a long time that concerns his wife.   Pt's wife has an appt around noon so try to call earlier or later.   CB#  [phone]

## 2021-02-23 NOTE — Telephone Encounter (Signed)
-----   Message from Kavin Leech, RN sent at 01/19/2021  5:22 PM EDT ----- Hulen Skains and spoke with daughter Manuela Schwartz per DPR on file. She stated she will call the patient with results tonight.   Patient is over due for a follow up appointment. No recall on file. Will send to scheduling to accommodate.

## 2021-02-23 NOTE — Telephone Encounter (Signed)
Attempted to schedule no ans no vm  

## 2021-02-27 ENCOUNTER — Ambulatory Visit: Payer: Medicare Other | Admitting: Family Medicine

## 2021-02-27 ENCOUNTER — Ambulatory Visit (INDEPENDENT_AMBULATORY_CARE_PROVIDER_SITE_OTHER): Payer: Medicare Other | Admitting: Family Medicine

## 2021-02-27 ENCOUNTER — Other Ambulatory Visit: Payer: Self-pay

## 2021-02-27 VITALS — BP 116/59 | HR 82 | Temp 98.6°F | Wt 145.0 lb

## 2021-02-27 DIAGNOSIS — R319 Hematuria, unspecified: Secondary | ICD-10-CM | POA: Diagnosis not present

## 2021-02-27 DIAGNOSIS — N390 Urinary tract infection, site not specified: Secondary | ICD-10-CM

## 2021-02-27 DIAGNOSIS — R011 Cardiac murmur, unspecified: Secondary | ICD-10-CM | POA: Diagnosis not present

## 2021-02-27 LAB — POCT URINALYSIS DIPSTICK
Bilirubin, UA: NEGATIVE
Glucose, UA: NEGATIVE
Nitrite, UA: POSITIVE
Protein, UA: POSITIVE — AB
Spec Grav, UA: >=1.03 — AB (ref 1.010–1.025)
Urobilinogen, UA: 0.2 E.U./dL
pH, UA: 6 (ref 5.0–8.0)

## 2021-02-27 MED ORDER — SULFAMETHOXAZOLE-TRIMETHOPRIM 800-160 MG PO TABS: 1.0000 | ORAL_TABLET | Freq: Two times a day (BID) | ORAL | 0 refills | Status: DC

## 2021-02-27 NOTE — Progress Notes (Signed)
Acute Office Visit  Subjective:    Patient ID: [name], male    DOB: 11-25-1924, 87 y.o.   MRN: [id number]  No chief complaint on file.   HPI Patient is in today for evaluation of hematuria.  Patient reports that he has had a rash around the penis and anus.  He reports that 5 days ago he had blood in his urine for 2 days. He is not doing better and has not seen any blood.  Past Medical History:  Diagnosis Date   Arthritis    Bladder outlet obstruction    Erectile dysfunction    GERD (gastroesophageal reflux disease)    Hard of hearing    HBP (high blood pressure)    History of nephrolithiasis    HLD (hyperlipidemia)    Incomplete bladder emptying    Nocturia    Osteoporosis    Prostate cancer ([place])    Shingles 2014   Sinus problem    Stomach ulcer    Stroke ([place]) 2012   Urinary frequency    Urinary leakage     Past Surgical History:  Procedure Laterality Date   CATARACT EXTRACTION  2000, 2001   [place] SURGERY  2008/2009   TONSILLECTOMY     UMBILICAL HERNIA REPAIR      Family History  Problem Relation Age of Onset   Cancer Mother    Stroke Father    Stroke Sister    Prostate cancer Brother    Kidney disease Neg Hx    Kidney cancer Neg Hx    Bladder Cancer Neg Hx     Social History   Socioeconomic History   Marital status: Married    Spouse name: Not on file   Number of children: 2   Years of education: Not on file   Highest education level: Master's degree (e.g., MA, MS, MEng, MEd, MSW, MBA)  Occupational History   Occupation: retired  Tobacco Use   Smoking status: Former    Years: 10.00    Types: Cigarettes    Quit date: 07/08/1962    Years since quitting: 58.6   Smokeless tobacco: Never   Tobacco comments:    quit 1964  Vaping Use   Vaping Use: Never used  Substance and Sexual Activity   Alcohol use: Yes    Alcohol/week: 1.0 - 2.0 standard drink    Types: 1 Glasses of wine per week    Comment: every  2-3 mths, 1 glass of wine daily     Drug use: No   Sexual activity: Not on file  Other Topics Concern   Not on file  Social History Narrative   Not on file   Social Determinants of Health   Financial Resource Strain: Low Risk    Difficulty of Paying Living Expenses: Not hard at all  Food Insecurity: No Food Insecurity   Worried About Charity fundraiser in the Last Year: Never true   Ran Out of Food in the Last Year: Never true  Transportation Needs: No Transportation Needs   Lack of Transportation (Medical): No   Lack of Transportation (Non-Medical): No  Physical Activity: Sufficiently Active   Days of Exercise per Week: 6 days   Minutes of Exercise per Session: 40 min  Stress: No Stress Concern Present   Feeling of Stress : Not at all  Social Connections: Moderately Isolated   Frequency of Communication with Friends and Family: Once a week   Frequency of  Social Gatherings with Friends and Family: More than three times a week   Attends Religious Services: Never   Marine scientist or Organizations: No   Attends Music therapist: Never   Marital Status: Married  Human resources officer Violence: Not At Risk   Fear of Current or Ex-Partner: No   Emotionally Abused: No   Physically Abused: No   Sexually Abused: No    Outpatient Medications Prior to Visit  Medication Sig Dispense Refill   amLODipine (NORVASC) 5 MG tablet Take 1 tablet by mouth once daily 90 tablet 0   aspirin EC 81 MG tablet Take 81 mg by mouth daily.     busPIRone (BUSPAR) 5 MG tablet Take 1 tablet (5 mg total) by mouth 2 (two) times daily. 60 tablet 2   fluticasone (FLONASE) 50 MCG/ACT nasal spray Place 1 spray into both nostrils daily. 16 g 5   furosemide (LASIX) 40 MG tablet Take 1 tablet (40 mg total) by mouth daily. 30 tablet 3   gabapentin (NEURONTIN) 100 MG capsule Take 1 capsule by mouth at bedtime 30 capsule 2   Multiple Vitamin (MULTI-VITAMIN) tablet Take 1 tablet by mouth daily.       naproxen sodium (ALEVE) 220 MG tablet Take 220 mg by mouth daily as needed (pain/headache).     Polyethyl Glycol-Propyl Glycol (SYSTANE OP) Place 1 drop into both eyes daily.      No facility-administered medications prior to visit.    Allergies  Allergen Reactions   Toviaz [Fesoterodine Fumarate Er] Other (See Comments)    Acid reflux    Review of Systems  Genitourinary:  Positive for difficulty urinating, genital sores, hematuria and urgency.  Skin:  Positive for rash.      Objective:    Physical Exam Constitutional:      General: He is not in acute distress.    Appearance: He is well-developed.  HENT:     Head: Normocephalic and atraumatic.     Right Ear: Hearing normal.     Left Ear: Hearing normal.     Nose: Nose normal.  Eyes:     General: Lids are normal. No scleral icterus.       Right eye: No discharge.        Left eye: No discharge.     Conjunctiva/sclera: Conjunctivae normal.  Cardiovascular:     Rate and Rhythm: Normal rate.     Heart sounds: Murmur heard.     Comments: Grade 1/6 systolic murmur unchanged. Pulmonary:     Effort: Pulmonary effort is normal. No respiratory distress.     Breath sounds: Normal breath sounds.  Abdominal:     General: Bowel sounds are normal.     Palpations: Abdomen is soft.     Tenderness: There is abdominal tenderness. There is no right CVA tenderness or left CVA tenderness.     Comments: Suprapubic bladder tenderness to palpation.  Musculoskeletal:        General: Normal range of motion.  Skin:    Findings: No lesion or rash.  Neurological:     Mental Status: He is alert and oriented to person, place, and time.  Psychiatric:        Speech: Speech normal.        Behavior: Behavior normal.        Thought Content: Thought content normal.    BP (!) 116/59 (BP Location: Right Arm, Patient Position: Sitting, Cuff Size: Normal)   Pulse 82   Temp 98.6  F (37 C) (Oral)   Wt 145 lb (65.8 kg)   SpO2 95%   BMI 24.13 kg/m   Wt Readings from Last 3 Encounters:  02/27/21 145 lb (65.8 kg)  11/27/20 151 lb 8 oz (68.7 kg)  10/06/20 150 lb 9.6 oz (68.3 kg)    Health Maintenance Due  Topic Date Due   Zoster Vaccines- Shingrix (1 of 2) Never done   COVID-19 Vaccine (5 - Booster for Moderna series) 02/05/2021   INFLUENZA VACCINE  02/05/2021    There are no preventive care reminders to display for this patient.   Lab Results  Component Value Date   TSH 2.640 06/09/2020   Lab Results  Component Value Date   WBC 5.3 10/05/2020   HGB 13.8 10/05/2020   HCT 42.5 10/05/2020   MCV 93.6 10/05/2020   PLT 187 10/05/2020   Lab Results  Component Value Date   NA 139 10/05/2020   K 3.7 10/05/2020   CO2 27 10/05/2020   GLUCOSE 145 (H) 10/05/2020   BUN 26 (H) 10/05/2020   CREATININE 0.92 10/05/2020   BILITOT 0.8 06/09/2020   ALKPHOS 69 06/09/2020   AST 32 06/09/2020   ALT 21 06/09/2020   PROT 6.5 06/09/2020   ALBUMIN 4.3 06/09/2020   CALCIUM 9.8 10/05/2020   ANIONGAP 9 10/05/2020   Lab Results  Component Value Date   CHOL 176 06/09/2020   Lab Results  Component Value Date   HDL 73 06/09/2020   Lab Results  Component Value Date   LDLCALC 91 06/09/2020   Lab Results  Component Value Date   TRIG 65 06/09/2020   Lab Results  Component Value Date   CHOLHDL 2.4 06/09/2020   Lab Results  Component Value Date   HGBA1C 5.4 10/06/2020       Assessment & Plan:   1. Urinary tract infection with hematuria, site unspecified Onset over the past week with some dark urine initially. Urinalysis today shows blood, leukocytes and nitrite positive. No fever and feels he is improved today. Will get labs to assess renal function and urine C&S. Start treatment with antibiotic and encouraged to drink extra fluids. Conducted visit with daughter in attendance telephonically. Recheck pending lab reports. - POCT urinalysis dipstick - Urine Culture - CBC with Differential/Platelet - Basic Metabolic Panel  (BMET) - sulfamethoxazole-trimethoprim (BACTRIM DS) 800-160 MG tablet; Take 1 tablet by mouth 2 (two) times daily.  Dispense: 20 tablet; Refill: 0  2. Murmur, cardiac Unchanged asymptomatic grade 1/6 murmur.    Juluis Mire, CMA  I, Vernie Murders, PA-C, have reviewed all documentation for this visit. The documentation on 02/27/21 for the exam, diagnosis, procedures, and orders are all accurate and complete.

## 2021-02-28 ENCOUNTER — Encounter: Payer: Self-pay | Admitting: Family Medicine

## 2021-02-28 LAB — BASIC METABOLIC PANEL
BUN/Creatinine Ratio: 26 — ABNORMAL HIGH (ref 10–24)
BUN: 26 mg/dL (ref 10–36)
CO2: 28 mmol/L (ref 20–29)
Calcium: 10.6 mg/dL — ABNORMAL HIGH (ref 8.6–10.2)
Chloride: 102 mmol/L (ref 96–106)
Creatinine, Ser: 1.01 mg/dL (ref 0.76–1.27)
Glucose: 106 mg/dL — ABNORMAL HIGH (ref 65–99)
Potassium: 5 mmol/L (ref 3.5–5.2)
Sodium: 141 mmol/L (ref 134–144)
eGFR: 69 mL/min/{1.73_m2} (ref >59)

## 2021-02-28 LAB — CBC WITH DIFFERENTIAL/PLATELET
Basophils Absolute: 0.1 10*3/uL (ref 0.0–0.2)
Basos: 1 %
EOS (ABSOLUTE): 0.1 10*3/uL (ref 0.0–0.4)
Eos: 2 %
Hematocrit: 43.5 % (ref 37.5–51.0)
Hemoglobin: 14.3 g/dL (ref 13.0–17.7)
Immature Grans (Abs): 0.1 10*3/uL (ref 0.0–0.1)
Immature Granulocytes: 1 %
Lymphocytes Absolute: 0.8 10*3/uL (ref 0.7–3.1)
Lymphs: 13 %
MCH: 30.6 pg (ref 26.6–33.0)
MCHC: 32.9 g/dL (ref 31.5–35.7)
MCV: 93 fL (ref 79–97)
Monocytes Absolute: 0.7 10*3/uL (ref 0.1–0.9)
Monocytes: 13 %
Neutrophils Absolute: 4 10*3/uL (ref 1.4–7.0)
Neutrophils: 70 %
Platelets: 226 10*3/uL (ref 150–450)
RBC: 4.67 x10E6/uL (ref 4.14–5.80)
RDW: 13.1 % (ref 11.6–15.4)
WBC: 5.7 10*3/uL (ref 3.4–10.8)

## 2021-03-01 LAB — URINE CULTURE

## 2021-03-01 LAB — SPECIMEN STATUS REPORT

## 2021-03-09 ENCOUNTER — Ambulatory Visit: Payer: Self-pay

## 2021-03-09 ENCOUNTER — Telehealth: Payer: Self-pay | Admitting: Family Medicine

## 2021-03-09 DIAGNOSIS — N390 Urinary tract infection, site not specified: Secondary | ICD-10-CM

## 2021-03-09 MED ORDER — SULFAMETHOXAZOLE-TRIMETHOPRIM 800-160 MG PO TABS: 1.0000 | ORAL_TABLET | Freq: Two times a day (BID) | ORAL | 0 refills | Status: DC

## 2021-03-09 NOTE — Telephone Encounter (Signed)
Please advise 

## 2021-03-09 NOTE — Telephone Encounter (Signed)
Pt.'s wife reports pt. Is still seeing blood in his urine. Started on Bactrim last week for UTI.Concerned he needs another antibiotic. Please advise.No other symptoms.    Answer Assessment - Initial Assessment Questions 1. ANTIBIOTIC: "What antibiotic are you taking?" "How many times per day?"     Sulfa 2. DURATION: "When was the antibiotic started?"     Last week 3. MAIN SYMPTOM: "What is the main symptom you are concerned about?"     Still seeing blood in urine 4. FEVER: "Do you have a fever?" If Yes, ask: "What is it, how was it measured, and when did it start?"     No 5. OTHER SYMPTOMS: "Do you have any other symptoms?" (e.g., flank pain, penile discharge, scrotal pain, blood in urine)     No  Protocols used: Urinary Tract Infection on Antibiotic Follow-up Call - Male-A-AH

## 2021-03-09 NOTE — Telephone Encounter (Signed)
Pts daugher [place], [phone] - Mobile  Is calling back in response the the NT message that was sent this morning regarding the pt still passing blood in his urine.  Wanting to know will another antibiotic be called in should pt anticipate coming to the office.  Pts daugher is concerned with a 3 day weekend coming up.  Please advise CB- [phone]

## 2021-03-09 NOTE — Telephone Encounter (Signed)
Refill Bactrim DS and schedule follow up in 7-10 days.

## 2021-03-09 NOTE — Telephone Encounter (Signed)
Rx was sent to pharmacy. Patient was scheduled for follow up.

## 2021-03-13 ENCOUNTER — Other Ambulatory Visit: Payer: Self-pay

## 2021-03-13 ENCOUNTER — Ambulatory Visit (INDEPENDENT_AMBULATORY_CARE_PROVIDER_SITE_OTHER): Payer: Medicare Other | Admitting: Family Medicine

## 2021-03-13 ENCOUNTER — Encounter: Payer: Self-pay | Admitting: Family Medicine

## 2021-03-13 VITALS — BP 118/64 | HR 67 | Temp 98.3°F | Resp 16 | Wt 141.0 lb

## 2021-03-13 DIAGNOSIS — R310 Gross hematuria: Secondary | ICD-10-CM | POA: Diagnosis not present

## 2021-03-13 DIAGNOSIS — R634 Abnormal weight loss: Secondary | ICD-10-CM

## 2021-03-13 DIAGNOSIS — Z8546 Personal history of malignant neoplasm of prostate: Secondary | ICD-10-CM | POA: Diagnosis not present

## 2021-03-13 NOTE — Progress Notes (Signed)
Established patient visit   Patient: [name]   DOB: [birth year]   87 y.o. Male  MRN: [id number] Visit Date: 03/13/2021  Today's healthcare provider: Lavon Paganini, MD   Chief Complaint  Patient presents with   Hematuria    Subjective  -------------------------------------------------------------------------------------------------------------------- Hematuria This is a recurrent problem. The current episode started 1 to 4 weeks ago. The problem is unchanged. He describes the hematuria as gross hematuria. He reports no clotting in his urine stream. He is experiencing no pain. He describes his urine color as dark red. Irritative symptoms do not include frequency, nocturia or urgency. Pertinent negatives include no abdominal pain, chills, dysuria, fever, flank pain, genital pain, inability to urinate, nausea or vomiting.    His last visit he was prescribed bactrim for a UTI however his symptoms didn't improve. Treatment for him to continue antibiotics. He denies having a urologist at this time. His last provider moved locations. He is amenable for referral. He continues taking his baby aspirin and is not taking any additional blood thinners at this time. He will interrupt oral intake of aspirin.  Change in Appetite Breakfast: Kellogs with apples and grapes, avocado, grape tomatoes He feel full most of the time and he has noticed a change in appetite. His wife reports his daily exercise includes 1 mile walking and 1 mile either on the treadmill or exercise equipment at the gym. His caloric intake to exercise can be a contributor to his loss in weight.   Wt Readings from Last 3 Encounters:  03/13/21 141 lb (64 kg)  02/27/21 145 lb (65.8 kg)  11/27/20 151 lb 8 oz (68.7 kg)       Medications: Outpatient Medications Prior to Visit  Medication Sig   amLODipine (NORVASC) 5 MG tablet Take 1 tablet by mouth once daily   busPIRone (BUSPAR) 5 MG tablet Take 1 tablet (5 mg  total) by mouth 2 (two) times daily.   fluticasone (FLONASE) 50 MCG/ACT nasal spray Place 1 spray into both nostrils daily.   furosemide (LASIX) 40 MG tablet Take 1 tablet (40 mg total) by mouth daily.   gabapentin (NEURONTIN) 100 MG capsule Take 1 capsule by mouth at bedtime   Multiple Vitamin (MULTI-VITAMIN) tablet Take 1 tablet by mouth daily.    naproxen sodium (ALEVE) 220 MG tablet Take 220 mg by mouth daily as needed (pain/headache).   Polyethyl Glycol-Propyl Glycol (SYSTANE OP) Place 1 drop into both eyes daily.    sulfamethoxazole-trimethoprim (BACTRIM DS) 800-160 MG tablet Take 1 tablet by mouth 2 (two) times daily.   [DISCONTINUED] aspirin EC 81 MG tablet Take 81 mg by mouth daily.   No facility-administered medications prior to visit.    Review of Systems  Constitutional:  Positive for appetite change and unexpected weight change. Negative for chills, fatigue and fever.  HENT:  Negative for ear pain, sinus pressure, sinus pain and sore throat.   Eyes:  Negative for pain and visual disturbance.  Respiratory:  Negative for cough, chest tightness, shortness of breath and wheezing.   Cardiovascular:  Negative for chest pain, palpitations and leg swelling.  Gastrointestinal:  Negative for abdominal pain, constipation, diarrhea, nausea and vomiting.  Genitourinary:  Positive for hematuria. Negative for dysuria, flank pain, frequency, nocturia and urgency.  Musculoskeletal:  Negative for back pain, myalgias and neck pain.  Skin:  Negative for rash.  Neurological:  Negative for dizziness, weakness, light-headedness, numbness and headaches.      Objective  -------------------------------------------------------------------------------------------------------------------- Today's  Vitals   03/13/21 1049  BP: 118/64  Pulse: 67  Resp: 16  Temp: 98.3 F (36.8 C)  TempSrc: Oral  Weight: 141 lb (64 kg)   Body mass index is 23.46 kg/m.    Wt Readings from Last 3 Encounters:   03/13/21 141 lb (64 kg)  02/27/21 145 lb (65.8 kg)  11/27/20 151 lb 8 oz (68.7 kg)      Physical Exam Vitals reviewed.  Constitutional:      General: He is not in acute distress.    Appearance: Normal appearance. He is not diaphoretic.  HENT:     Head: Normocephalic and atraumatic.  Eyes:     General: No scleral icterus.    Conjunctiva/sclera: Conjunctivae normal.  Cardiovascular:     Rate and Rhythm: Normal rate and regular rhythm.     Pulses: Normal pulses.     Heart sounds: Murmur heard.  Pulmonary:     Effort: Pulmonary effort is normal. No respiratory distress.     Breath sounds: Normal breath sounds. No wheezing or rhonchi.  Abdominal:     General: There is no distension.     Palpations: Abdomen is soft.     Tenderness: There is no abdominal tenderness.  Musculoskeletal:     Cervical back: Neck supple.     Right lower leg: No edema.     Left lower leg: No edema.  Lymphadenopathy:     Cervical: No cervical adenopathy.  Skin:    General: Skin is warm and dry.     Capillary Refill: Capillary refill takes less than 2 seconds.     Findings: No rash.  Neurological:     Mental Status: He is alert and oriented to person, place, and time.     Cranial Nerves: No cranial nerve deficit.  Psychiatric:        Mood and Affect: Mood normal.        Behavior: Behavior normal.      No results found for any visits on 03/13/21.  Assessment & Plan  ---------------------------------------------------------------------------------------------------------------------- Problem List Items Addressed This Visit       Genitourinary   Gross hematuria - Primary   Relevant Orders   Ambulatory referral to Urology   Urinalysis   Other Visit Diagnoses     Unintentional weight loss       History of prostate cancer         - ongoing gross hematuria x5 weeks or so - no improvement with 10 d course of Bactrim from [facility], but then this was refilled (will likely d/c pending  UA/UCx) - need to monitor weight to ensure no ongoing losses - may be due to change in exercise, but differential is long - consider Remeron in the future if ongoing weight loss - hold ASA while having hematuria - Will bring back urine sample this afternoon - Referral to Urology for further eval and management   Return in about 3 months (around 06/12/2021) for chronic disease f/u.      I,Essence Turner,acting as a Education administrator for Lavon Paganini, MD.,have documented all relevant documentation on the behalf of Lavon Paganini, MD,as directed by  Lavon Paganini, MD while in the presence of Lavon Paganini, MD.  I, Lavon Paganini, MD, have reviewed all documentation for this visit. The documentation on 03/13/21 for the exam, diagnosis, procedures, and orders are all accurate and complete.   Omaira Mellen, Dionne Bucy, MD, MPH [facility]

## 2021-03-14 LAB — URINALYSIS
Bilirubin, UA: NEGATIVE
Glucose, UA: NEGATIVE
Ketones, UA: NEGATIVE
Nitrite, UA: NEGATIVE
Specific Gravity, UA: 1.022 (ref 1.005–1.030)
Urobilinogen, Ur: 0.2 mg/dL (ref 0.2–1.0)
pH, UA: 5.5 (ref 5.0–7.5)

## 2021-03-14 LAB — SPECIMEN STATUS REPORT

## 2021-03-15 ENCOUNTER — Other Ambulatory Visit: Payer: Self-pay | Admitting: Family Medicine

## 2021-03-15 NOTE — H&P (View-Only) (Signed)
07/16/2018 4:21 PM   [name] 1925-02-02 [id number]  Referring provider: [name], [place],  [place] [address]  Urological history: 1. Prostate cancer -PSA 0.6 in 2020 -cryotherapy for a Gleason's 6 (10 cores + out of 12)-invasion present with a PSA of 4.3 and a 11% free PSA PCa in 2009 with [name]  2. BPH with LU TS  3. Incontinence -contributing factors of age, prostate cancer, stroke, arthritis, diuretics and memory issues -failed OAB meds -failed PTNS   Chief Complaint  Patient presents with   Hematuria     HPI: [name] is a 87 y.o. male who presents today for gross hematuria.    He has been experiencing pain less gross heme off and on for 5 weeks.  He was originally seen by his PCP on 02/27/2021 for this complaint.  His urine culture was positive for pan-sensitive E.coli.  He was treated with culture appropriate antibiotics, but he continued to have intermittent gross heme.   He followed up with his PCP again on 03/13/2021 and was told his urine no longer looked infected and to follow up with urology.  He continues to have episodes of intermittent gross heme at this time.  UA 0-5 WBC's, > 30 RBC's and moderate.    PVR 24 mL.  PMH: Past Medical History:  Diagnosis Date   Arthritis    Bladder outlet obstruction    Erectile dysfunction    GERD (gastroesophageal reflux disease)    Hard of hearing    HBP (high blood pressure)    History of nephrolithiasis    HLD (hyperlipidemia)    Incomplete bladder emptying    Nocturia    Osteoporosis    Prostate cancer ([facility])    Shingles 2014   Sinus problem    Stomach ulcer    Stroke ([facility]) 2012   Urinary frequency    Urinary leakage     Surgical History: Past Surgical History:  Procedure Laterality Date   CATARACT EXTRACTION  2000, 2001   [place]  2008/2009   TONSILLECTOMY     UMBILICAL HERNIA REPAIR      Home  Medications:  Allergies as of 03/16/2021       Reactions   Toviaz [fesoterodine Fumarate Er] Other (See Comments)   Acid reflux        Medication List        Accurate as of March 16, 2021 11:59 PM. If you have any questions, ask your nurse or doctor.          STOP taking these medications    sulfamethoxazole-trimethoprim 800-160 MG tablet Commonly known as: BACTRIM DS Stopped by: Lavon Paganini, MD       TAKE these medications    amLODipine 5 MG tablet Commonly known as: NORVASC Take 1 tablet by mouth once daily   busPIRone 5 MG tablet Commonly known as: BUSPAR Take 1 tablet by mouth twice daily   fluticasone 50 MCG/ACT nasal spray Commonly known as: Flonase Place 1 spray into both nostrils daily.   furosemide 40 MG tablet Commonly known as: LASIX Take 1 tablet (40 mg total) by mouth daily.   gabapentin 100 MG capsule Commonly known as: NEURONTIN Take 1 capsule by mouth at bedtime   Multi-Vitamin tablet Take 1 tablet by mouth daily.   naproxen sodium 220 MG tablet Commonly known as: ALEVE Take 220 mg by mouth daily as needed (pain/headache).  SYSTANE OP Place 1 drop into both eyes daily.        Allergies:  Allergies  Allergen Reactions   Toviaz [Fesoterodine Fumarate Er] Other (See Comments)    Acid reflux    Family History: Family History  Problem Relation Age of Onset   Cancer Mother    Stroke Father    Stroke Sister    Prostate cancer Brother    Kidney disease Neg Hx    Kidney cancer Neg Hx    Bladder Cancer Neg Hx     Social History:  reports that he quit smoking about 58 years ago. His smoking use included cigarettes. He has never used smokeless tobacco. He reports current alcohol use of about 1.0 - 2.0 standard drink per week. He reports that he does not use drugs.   ROS For pertinent review of systems please refer to history of present illness   Physical Exam: BP 133/74   Pulse 74   Ht 5' 5" (1.651 m)   Wt 141  lb (64 kg)   BMI 23.46 kg/m   Constitutional:  Well nourished. Alert and oriented, No acute distress. HEENT: [place] AT, mask in place.  Trachea midline Cardiovascular: No clubbing, cyanosis, or edema. Respiratory: Normal respiratory effort, no increased work of breathing. Neurologic: Grossly intact, no focal deficits, moving all 4 extremities. Psychiatric: Normal mood and affect.   Laboratory Data: Component     Latest Ref Rng & Units 02/27/2021  Glucose     65 - 99 mg/dL 106 (H)  BUN     10 - 36 mg/dL 26  Creatinine     0.76 - 1.27 mg/dL 1.01  eGFR     >59 mL/min/1.73 69  BUN/Creatinine Ratio     10 - 24 26 (H)  Sodium     134 - 144 mmol/L 141  Potassium     3.5 - 5.2 mmol/L 5.0  Chloride     96 - 106 mmol/L 102  CO2     20 - 29 mmol/L 28  Calcium     8.6 - 10.2 mg/dL 10.6 (H)   Component     Latest Ref Rng & Units 02/27/2021  WBC     3.4 - 10.8 x10E3/uL 5.7  RBC     4.14 - 5.80 x10E6/uL 4.67  Hemoglobin     13.0 - 17.7 g/dL 14.3  HCT     37.5 - 51.0 % 43.5  MCV     79 - 97 fL 93  MCH     26.6 - 33.0 pg 30.6  MCHC     31.5 - 35.7 g/dL 32.9  RDW     11.6 - 15.4 % 13.1  Platelets     150 - 450 x10E3/uL 226  nRBC     0.0 - 0.2 %   Neutrophils     Not Estab. % 70  NEUT#     1.4 - 7.0 x10E3/uL 4.0  Lymphocytes     %   Lymphocyte #     0.7 - 3.1 x10E3/uL 0.8  Monocytes Relative     %   Monocyte #     0.1 - 1.0 K/uL   Eosinophil     %   Eosinophils Absolute     0.0 - 0.5 K/uL   Basophil     %   Basophils Absolute     0.0 - 0.2 x10E3/uL 0.1  Immature Granulocytes     Not Estab. % 1  Abs Immature  Granulocytes     0.00 - 0.07 K/uL   Lymphs     Not Estab. % 13  Monocytes     Not Estab. % 13  Eos     Not Estab. % 2  Basos     Not Estab. % 1  Monocytes Absolute     0.1 - 0.9 x10E3/uL 0.7  EOS (ABSOLUTE)     0.0 - 0.4 x10E3/uL 0.1  Immature Grans (Abs)     0.0 - 0.1 x10E3/uL 0.1  WBC, UA     0 - 5 /hpf   Epithelial Cells (non renal)     0  - 10 /hpf   Casts     None seen /lpf   Bacteria, UA     None seen/Few    Urinalysis Component     Latest Ref Rng & Units 03/16/2021  Specific Gravity, UA     1.005 - 1.030 1.025  pH, UA     5.0 - 7.5 5.5  Color, UA     Yellow Orange  Appearance Ur     Clear Cloudy (A)  Leukocytes,UA     Negative Negative  Protein,UA     Negative/Trace 1+ (A)  Glucose, UA     Negative Negative  Ketones, UA     Negative Negative  RBC, UA     Negative 3+ (A)  Bilirubin, UA     Negative Negative  Urobilinogen, Ur     0.2 - 1.0 mg/dL 0.2  Nitrite, UA     Negative Negative  Microscopic Examination      See below:   Component     Latest Ref Rng & Units 03/16/2021  WBC, UA     0 - 5 /hpf 0-5  RBC     0 - 2 /hpf >30 (A)  Epithelial Cells (non renal)     0 - 10 /hpf 0-10  Crystals     N/A Present (A)  Crystal Type     N/A Calcium Oxalate  Bacteria, UA     None seen/Few Moderate (A)  I have reviewed the labs.  Pertinent Imaging Results for TINA, TEMME (MRN [id number]) as of 03/16/2021 12:15  Ref. Range 03/16/2021 11:35  Scan Result Unknown 24    Assessment & Plan:    1. Prostate cancer -PSA prior to the cysto as lab was not available at the time of his visit   2. Incontinence -managed with depends  3. Gross hematuria -former smoker -AUA hematuria stratification: High risk -recommendations are for CT urogram followed by cystoscopy -explained that the the CTU is performed with contrast and that there is a risk of an anaphylactic reaction to the dye, he denies an allergies to seafood, iodine or contrast material -I have explained to the patient that they will  be scheduled for a cystoscopy in our office to evaluate their bladder.  The cystoscopy consists of passing a tube with a lens up through their urethra and into their urinary bladder.   We will inject the urethra with a lidocaine gel prior to introducing the cystoscope to help with any discomfort during the procedure.    After the procedure, they might experience blood in the urine and discomfort with urination.  This will abate after the first few voids.  I have  encouraged the patient to increase water intake  during this time.  Patient denies any allergies to lidocaine.   -schedule for cysto    Return for CT Urogram report and  cystoscopy for gross heme .  These notes generated with voice recognition software. I apologize for typographical errors.  Zara Council, PA-C  [facility] [address] [place], [place] [address] [phone]

## 2021-03-15 NOTE — Progress Notes (Signed)
  07/16/2018 4:21 PM   [name] 02/07/1925 [id number]  Referring provider: Bacigalupo, Angela M, MD [address] [place],  [place] [address]  Urological history: 1. Prostate cancer -PSA 0.6 in 2020 -cryotherapy for a Gleason's 6 (10 cores + out of 12)-invasion present with a PSA of 4.3 and a 11% free PSA PCa in 2009 with Dr. Cope  2. BPH with LU TS  3. Incontinence -contributing factors of age, prostate cancer, stroke, arthritis, diuretics and memory issues -failed OAB meds -failed PTNS   Chief Complaint  Patient presents with   Hematuria     HPI: [name] is a 87 y.o. male who presents today for gross hematuria.    He has been experiencing pain less gross heme off and on for 5 weeks.  He was originally seen by his PCP on 02/27/2021 for this complaint.  His urine culture was positive for pan-sensitive E.coli.  He was treated with culture appropriate antibiotics, but he continued to have intermittent gross heme.   He followed up with his PCP again on 03/13/2021 and was told his urine no longer looked infected and to follow up with urology.  He continues to have episodes of intermittent gross heme at this time.  UA 0-5 WBC's, > 30 RBC's and moderate.    PVR 24 mL.  PMH: Past Medical History:  Diagnosis Date   Arthritis    Bladder outlet obstruction    Erectile dysfunction    GERD (gastroesophageal reflux disease)    Hard of hearing    HBP (high blood pressure)    History of nephrolithiasis    HLD (hyperlipidemia)    Incomplete bladder emptying    Nocturia    Osteoporosis    Prostate cancer ([facility])    Shingles 2014   Sinus problem    Stomach ulcer    Stroke ([facility]) 2012   Urinary frequency    Urinary leakage     Surgical History: Past Surgical History:  Procedure Laterality Date   CATARACT EXTRACTION  2000, 2001   CHOLECYSTECTOMY  1972   PROSTATE SURGERY  2008/2009   TONSILLECTOMY     UMBILICAL HERNIA REPAIR      Home  Medications:  Allergies as of 03/16/2021       Reactions   Toviaz [fesoterodine Fumarate Er] Other (See Comments)   Acid reflux        Medication List        Accurate as of March 16, 2021 11:59 PM. If you have any questions, ask your nurse or doctor.          STOP taking these medications    sulfamethoxazole-trimethoprim 800-160 MG tablet Commonly known as: BACTRIM DS Stopped by: Angela Bacigalupo, MD       TAKE these medications    amLODipine 5 MG tablet Commonly known as: NORVASC Take 1 tablet by mouth once daily   busPIRone 5 MG tablet Commonly known as: BUSPAR Take 1 tablet by mouth twice daily   fluticasone 50 MCG/ACT nasal spray Commonly known as: Flonase Place 1 spray into both nostrils daily.   furosemide 40 MG tablet Commonly known as: LASIX Take 1 tablet (40 mg total) by mouth daily.   gabapentin 100 MG capsule Commonly known as: NEURONTIN Take 1 capsule by mouth at bedtime   Multi-Vitamin tablet Take 1 tablet by mouth daily.   naproxen sodium 220 MG tablet Commonly known as: ALEVE Take 220 mg by mouth daily as needed (pain/headache).     SYSTANE OP Place 1 drop into both eyes daily.        Allergies:  Allergies  Allergen Reactions   Toviaz [Fesoterodine Fumarate Er] Other (See Comments)    Acid reflux    Family History: Family History  Problem Relation Age of Onset   Cancer Mother    Stroke Father    Stroke Sister    Prostate cancer Brother    Kidney disease Neg Hx    Kidney cancer Neg Hx    Bladder Cancer Neg Hx     Social History:  reports that he quit smoking about 58 years ago. His smoking use included cigarettes. He has never used smokeless tobacco. He reports current alcohol use of about 1.0 - 2.0 standard drink per week. He reports that he does not use drugs.   ROS For pertinent review of systems please refer to history of present illness   Physical Exam: BP 133/74   Pulse 74   Ht 5' 5" (1.651 m)   Wt 141  lb (64 kg)   BMI 23.46 kg/m   Constitutional:  Well nourished. Alert and oriented, No acute distress. HEENT: [place] AT, mask in place.  Trachea midline Cardiovascular: No clubbing, cyanosis, or edema. Respiratory: Normal respiratory effort, no increased work of breathing. Neurologic: Grossly intact, no focal deficits, moving all 4 extremities. Psychiatric: Normal mood and affect.   Laboratory Data: Component     Latest Ref Rng & Units 02/27/2021  Glucose     65 - 99 mg/dL 106 (H)  BUN     10 - 36 mg/dL 26  Creatinine     0.76 - 1.27 mg/dL 1.01  eGFR     >59 mL/min/1.73 69  BUN/Creatinine Ratio     10 - 24 26 (H)  Sodium     134 - 144 mmol/L 141  Potassium     3.5 - 5.2 mmol/L 5.0  Chloride     96 - 106 mmol/L 102  CO2     20 - 29 mmol/L 28  Calcium     8.6 - 10.2 mg/dL 10.6 (H)   Component     Latest Ref Rng & Units 02/27/2021  WBC     3.4 - 10.8 x10E3/uL 5.7  RBC     4.14 - 5.80 x10E6/uL 4.67  Hemoglobin     13.0 - 17.7 g/dL 14.3  HCT     37.5 - 51.0 % 43.5  MCV     79 - 97 fL 93  MCH     26.6 - 33.0 pg 30.6  MCHC     31.5 - 35.7 g/dL 32.9  RDW     11.6 - 15.4 % 13.1  Platelets     150 - 450 x10E3/uL 226  nRBC     0.0 - 0.2 %   Neutrophils     Not Estab. % 70  NEUT#     1.4 - 7.0 x10E3/uL 4.0  Lymphocytes     %   Lymphocyte #     0.7 - 3.1 x10E3/uL 0.8  Monocytes Relative     %   Monocyte #     0.1 - 1.0 K/uL   Eosinophil     %   Eosinophils Absolute     0.0 - 0.5 K/uL   Basophil     %   Basophils Absolute     0.0 - 0.2 x10E3/uL 0.1  Immature Granulocytes     Not Estab. % 1  Abs Immature   Granulocytes     0.00 - 0.07 K/uL   Lymphs     Not Estab. % 13  Monocytes     Not Estab. % 13  Eos     Not Estab. % 2  Basos     Not Estab. % 1  Monocytes Absolute     0.1 - 0.9 x10E3/uL 0.7  EOS (ABSOLUTE)     0.0 - 0.4 x10E3/uL 0.1  Immature Grans (Abs)     0.0 - 0.1 x10E3/uL 0.1  WBC, UA     0 - 5 /hpf   Epithelial Cells (non renal)     0  - 10 /hpf   Casts     None seen /lpf   Bacteria, UA     None seen/Few    Urinalysis Component     Latest Ref Rng & Units 03/16/2021  Specific Gravity, UA     1.005 - 1.030 1.025  pH, UA     5.0 - 7.5 5.5  Color, UA     Yellow Orange  Appearance Ur     Clear Cloudy (A)  Leukocytes,UA     Negative Negative  Protein,UA     Negative/Trace 1+ (A)  Glucose, UA     Negative Negative  Ketones, UA     Negative Negative  RBC, UA     Negative 3+ (A)  Bilirubin, UA     Negative Negative  Urobilinogen, Ur     0.2 - 1.0 mg/dL 0.2  Nitrite, UA     Negative Negative  Microscopic Examination      See below:   Component     Latest Ref Rng & Units 03/16/2021  WBC, UA     0 - 5 /hpf 0-5  RBC     0 - 2 /hpf >30 (A)  Epithelial Cells (non renal)     0 - 10 /hpf 0-10  Crystals     N/A Present (A)  Crystal Type     N/A Calcium Oxalate  Bacteria, UA     None seen/Few Moderate (A)  I have reviewed the labs.  Pertinent Imaging Results for ZYMIERE, TROSTLE (MRN [id number]) as of 03/16/2021 12:15  Ref. Range 03/16/2021 11:35  Scan Result Unknown 24    Assessment & Plan:    1. Prostate cancer -PSA prior to the cysto as lab was not available at the time of his visit   2. Incontinence -managed with depends  3. Gross hematuria -former smoker -AUA hematuria stratification: High risk -recommendations are for CT urogram followed by cystoscopy -explained that the the CTU is performed with contrast and that there is a risk of an anaphylactic reaction to the dye, he denies an allergies to seafood, iodine or contrast material -I have explained to the patient that they will  be scheduled for a cystoscopy in our office to evaluate their bladder.  The cystoscopy consists of passing a tube with a lens up through their urethra and into their urinary bladder.   We will inject the urethra with a lidocaine gel prior to introducing the cystoscope to help with any discomfort during the procedure.    After the procedure, they might experience blood in the urine and discomfort with urination.  This will abate after the first few voids.  I have  encouraged the patient to increase water intake  during this time.  Patient denies any allergies to lidocaine.   -schedule for cysto    Return for CT Urogram report and  cystoscopy for gross heme .  These notes generated with voice recognition software. I apologize for typographical errors.  Zara Council, PA-C  [facility] [address] [place], [place] [address] [phone]

## 2021-03-15 NOTE — Telephone Encounter (Signed)
Requested Prescriptions  Pending Prescriptions Disp Refills  . busPIRone (BUSPAR) 5 MG tablet [Pharmacy Med Name: busPIRone HCl 5 MG Oral Tablet] 90 tablet 0    Sig: Take 1 tablet by mouth twice daily     Psychiatry: Anxiolytics/Hypnotics - Non-controlled Passed - 03/15/2021  5:30 AM      Passed - Valid encounter within last 6 months    Recent Outpatient Visits          2 days ago Gross hematuria   [facility] [place], Dionne Bucy, MD   2 weeks ago Urinary tract infection with hematuria, site unspecified   [place], Vickki Muff, PA-C   3 months ago Mitral regurgitation and aortic stenosis   [facility] [place], Dionne Bucy, MD   5 months ago Mitral regurgitation and aortic stenosis   [facility], Dionne Bucy, MD   6 months ago Cystitis with hematuria   [facility] Bacigalupo, Dionne Bucy, MD      Future Appointments            Tomorrow McG[place], Shannon A, [place]   In 3 months Bacigalupo, Dionne Bucy, MD [place], [place]

## 2021-03-16 ENCOUNTER — Encounter: Payer: Self-pay | Admitting: Urology

## 2021-03-16 ENCOUNTER — Other Ambulatory Visit: Payer: Self-pay

## 2021-03-16 ENCOUNTER — Other Ambulatory Visit: Payer: Self-pay | Admitting: Family Medicine

## 2021-03-16 ENCOUNTER — Ambulatory Visit (INDEPENDENT_AMBULATORY_CARE_PROVIDER_SITE_OTHER): Payer: Medicare Other | Admitting: Urology

## 2021-03-16 VITALS — BP 133/74 | HR 74 | Ht 65.0 in | Wt 141.0 lb

## 2021-03-16 DIAGNOSIS — N139 Obstructive and reflux uropathy, unspecified: Secondary | ICD-10-CM

## 2021-03-16 DIAGNOSIS — R310 Gross hematuria: Secondary | ICD-10-CM | POA: Diagnosis not present

## 2021-03-16 LAB — URINALYSIS, COMPLETE
Bilirubin, UA: NEGATIVE
Glucose, UA: NEGATIVE
Ketones, UA: NEGATIVE
Leukocytes,UA: NEGATIVE
Nitrite, UA: NEGATIVE
Specific Gravity, UA: 1.025 (ref 1.005–1.030)
Urobilinogen, Ur: 0.2 mg/dL (ref 0.2–1.0)
pH, UA: 5.5 (ref 5.0–7.5)

## 2021-03-16 LAB — MICROSCOPIC EXAMINATION: RBC, Urine: >30 /hpf — AB (ref 0–2)

## 2021-03-16 LAB — BLADDER SCAN AMB NON-IMAGING: Scan Result: 24

## 2021-03-16 NOTE — Patient Instructions (Signed)
Cystoscopy Cystoscopy is a procedure that is used to help diagnose and sometimes treat conditions that affect the lower urinary tract. The lower urinary tract includes the bladder and the urethra. The urethra is the tube that drains urine from the bladder. Cystoscopy is done using a thin, tube-shaped instrument with a light and camera at the end (cystoscope). The cystoscope may be hard or flexible, depending on the goal of the procedure. The cystoscope is inserted through the urethra, into the bladder. Cystoscopy may be recommended if you have: Urinary tract infections that keep coming back. Blood in the urine (hematuria). An inability to control when you urinate (urinary incontinence) or an overactive bladder. Unusual cells found in a urine sample. A blockage in the urethra, such as a urinary stone. Painful urination. An abnormality in the bladder found during an intravenous pyelogram (IVP) or CT scan. Cystoscopy may also be done to remove a sample of tissue to be examined under a microscope (biopsy). What are the risks? Generally, this is a safe procedure. However, problems may occur, including: Infection. Bleeding.  What happens during the procedure?  You will be given one or more of the following: A medicine to numb the area (local anesthetic). The area around the opening of your urethra will be cleaned. The cystoscope will be passed through your urethra into your bladder. Germ-free (sterile) fluid will flow through the cystoscope to fill your bladder. The fluid will stretch your bladder so that your health care provider can clearly examine your bladder walls. Your doctor will look at the urethra and bladder. The cystoscope will be removed The procedure may vary among health care providers  What can I expect after the procedure? After the procedure, it is common to have: Some soreness or pain in your abdomen and urethra. Urinary symptoms. These include: Mild pain or burning when you  urinate. Pain should stop within a few minutes after you urinate. This may last for up to 1 week. A small amount of blood in your urine for several days. Feeling like you need to urinate but producing only a small amount of urine. Follow these instructions at home: General instructions Return to your normal activities as told by your health care provider.  Do not drive for 24 hours if you were given a sedative during your procedure. Watch for any blood in your urine. If the amount of blood in your urine increases, call your health care provider. If a tissue sample was removed for testing (biopsy) during your procedure, it is up to you to get your test results. Ask your health care provider, or the department that is doing the test, when your results will be ready. Drink enough fluid to keep your urine pale yellow. Keep all follow-up visits as told by your health care provider. This is important. Contact a health care provider if you: Have pain that gets worse or does not get better with medicine, especially pain when you urinate. Have trouble urinating. Have more blood in your urine. Get help right away if you: Have blood clots in your urine. Have abdominal pain. Have a fever or chills. Are unable to urinate. Summary Cystoscopy is a procedure that is used to help diagnose and sometimes treat conditions that affect the lower urinary tract. Cystoscopy is done using a thin, tube-shaped instrument with a light and camera at the end. After the procedure, it is common to have some soreness or pain in your abdomen and urethra. Watch for any blood in your urine.   If the amount of blood in your urine increases, call your health care provider. If you were prescribed an antibiotic medicine, take it as told by your health care provider. Do not stop taking the antibiotic even if you start to feel better. This information is not intended to replace advice given to you by your health care provider. Make  sure you discuss any questions you have with your health care provider. Document Revised: 06/16/2018 Document Reviewed: 06/16/2018 Elsevier Patient Education  2020 [employer] Inc[employer]  

## 2021-03-19 ENCOUNTER — Ambulatory Visit
Admission: RE | Admit: 2021-03-19 | Discharge: 2021-03-19 | Disposition: A | Discharge status: Home / Self Care | Payer: Medicare Other | Source: Ambulatory Visit | Attending: Urology | Admitting: Urology

## 2021-03-19 ENCOUNTER — Other Ambulatory Visit: Payer: Self-pay

## 2021-03-19 DIAGNOSIS — R310 Gross hematuria: Secondary | ICD-10-CM | POA: Insufficient documentation

## 2021-03-19 DIAGNOSIS — K5730 Diverticulosis of large intestine without perforation or abscess without bleeding: Secondary | ICD-10-CM | POA: Diagnosis not present

## 2021-03-19 DIAGNOSIS — K449 Diaphragmatic hernia without obstruction or gangrene: Secondary | ICD-10-CM | POA: Diagnosis not present

## 2021-03-19 DIAGNOSIS — N201 Calculus of ureter: Secondary | ICD-10-CM | POA: Diagnosis not present

## 2021-03-19 MED ORDER — IOHEXOL 350 MG/ML SOLN
80.0000 mL | Freq: Once | INTRAVENOUS | Status: AC | PRN
  Administered 2021-03-19: 80 mL via INTRAVENOUS

## 2021-03-21 ENCOUNTER — Other Ambulatory Visit: Payer: Self-pay

## 2021-03-21 ENCOUNTER — Other Ambulatory Visit: Payer: Self-pay | Admitting: Urology

## 2021-03-21 ENCOUNTER — Encounter: Payer: Self-pay | Admitting: Urology

## 2021-03-21 ENCOUNTER — Telehealth: Payer: Self-pay | Admitting: Urology

## 2021-03-21 ENCOUNTER — Ambulatory Visit (INDEPENDENT_AMBULATORY_CARE_PROVIDER_SITE_OTHER): Payer: Medicare Other | Admitting: Urology

## 2021-03-21 ENCOUNTER — Telehealth: Payer: Self-pay

## 2021-03-21 ENCOUNTER — Encounter: Payer: Self-pay | Admitting: Family Medicine

## 2021-03-21 VITALS — BP 134/74 | HR 70 | Ht 65.0 in | Wt 141.0 lb

## 2021-03-21 DIAGNOSIS — N201 Calculus of ureter: Secondary | ICD-10-CM

## 2021-03-21 DIAGNOSIS — N200 Calculus of kidney: Secondary | ICD-10-CM | POA: Diagnosis not present

## 2021-03-21 DIAGNOSIS — R310 Gross hematuria: Secondary | ICD-10-CM | POA: Diagnosis not present

## 2021-03-21 LAB — CULTURE, URINE COMPREHENSIVE

## 2021-03-21 MED ORDER — LIDOCAINE HCL URETHRAL/MUCOSAL 2 % EX GEL: 1.0000 "application " | Freq: Once | CUTANEOUS | Status: DC

## 2021-03-21 NOTE — Progress Notes (Signed)
[facility] Posting Form   Surgery Date/Time: Date: 03/23/2021  Surgeon: Dr. Nickolas Madrid, MD  Surgery Location: Day Surgery  Inpt ( No  )   Outpt (Yes)   Obs ( No  )   Diagnosis: N20.1 Left Ureteral Stone  -CPT: [phone]   Surgery: Cystoscopy, left ureteroscopy, laser lithotripsy, stent placement  Stop Anticoagulations: No  Cardiac/Medical/Pulmonary Clearance needed: No Clearance Needed  *Orders entered into EPIC  Date: 03/21/21   *Case booked in EPIC  Date: 03/21/21  *Notified pt of Surgery: Date: 03/21/21  PRE-OP UA & CX: No  *Placed into Prior Authorization Work [place] Date: 03/21/21   Assistant/laser/rep:No

## 2021-03-21 NOTE — Progress Notes (Signed)
Surgical Physician Order Form  ** Scheduling expectation-can schedule this Friday if patient prefers, otherwise 1 to 2 weeks is fine  *Length of Case: 30 minutes  *Clearance needed: no  *Anticoagulation Instructions: Okay to continue aspirin  *Aspirin Instructions: Okay to continue aspirin  *Post-op visit Date/Instructions: TBD  *Diagnosis: Left ureteral stone  *Procedure: Cystoscopy, left ureteroscopy, laser lithotripsy, stent placement  -Admit type: Outpatient  Yes Other: ___  -Anesthesia: general  -VTE Prophylaxis Standing Order SCD's       Other: __________  -Standing Lab Orders Per Anesthesia    Lab other: _______   UA&Urine Culture [No]   -Standing Test orders EKG/Chest x-ray per Anesthesia       Test other: ______   - Medications:       Ancef 2g IV    Gentamicin __ mg IV or per pharmacy '[]'$     Gemcitabine '2000mg'$  bladder instillation '[]'$      Other:__________

## 2021-03-21 NOTE — Addendum Note (Signed)
Addended by: Donalee Citrin on: 03/21/2021 02:53 PM   Modules accepted: Orders

## 2021-03-21 NOTE — Progress Notes (Signed)
   03/21/2021 10:33 AM   [name] Feb 11, 1925 MD:[id number]  Reason for visit: gross hematuria  HPI: 87 year old male who was set up for cystoscopy by Zara Council, PA today.  He had a UTI and gross hematuria, but recurrent gross hematuria despite negative urine culture and underwent hematuria work-up.  I personally viewed and interpreted the CT urogram today that shows a 3 mm left distal ureteral stone with minimal hydronephrosis, and does some distal ureteral thickening, no obvious bladder lesions.  With his recurrent infections and hematuria, I recommended ureteroscopy for the left-sided stone, as well as cystoscopy and possible biopsy at that time.  We discussed the possibility of finding additional pathology at the time of cystoscopy. We specifically discussed the risks ureteroscopy including bleeding, infection/sepsis, stent related symptoms including flank pain/urgency/frequency/incontinence/dysuria, ureteral injury, inability to access stone, or need for staged or additional procedures.  Schedule cystoscopy left ureteroscopy, laser lithotripsy, stent placement  Billey Co, MD  [facility] [address], [place], [place] [address] [phone]

## 2021-03-21 NOTE — Telephone Encounter (Signed)
Patient surgery orders entered and faxed to PAT, no clearances req'd.

## 2021-03-21 NOTE — Telephone Encounter (Signed)
Copied from [place] [id number]. Topic: General - Other >> Mar 21, 2021  3:08 PM Pawlus, Brayton Layman A wrote: Reason for CRM: Pts daughter called and really wanted to talk to Dr. B regarding the pts upcoming surgery, caller is nervous about the pt being put under and wanted Dr Sharmaine Base advice.

## 2021-03-21 NOTE — Addendum Note (Signed)
Addended by: Gerald Leitz A on: 03/21/2021 11:39 AM   Modules accepted: Orders, SmartSet

## 2021-03-21 NOTE — Telephone Encounter (Signed)
Per Dr. Diamantina Providence Patient is to be scheduled for Cystoscopy,Ureteroscopy,Laser Lithotripsy,Ureteral Stent Placement  [name]  was seen and talked to in office by surgery coordinator and possible surgical dates were discussed, 03/23/21 was agreed upon for surgery. Patient was directed to call [phone] between 1-3pm the day before surgery to find out surgical arrival time.  Instructions were given not to eat or drink from midnight on the night before surgery and have a driver for the day of surgery. On the surgery day patient was instructed to enter through the [place] entrance of [facility] report the Same Day Surgery desk.   Pre-Admit Testing will be in contact via phone to set up an interview with the anesthesia team to review your history and medications prior to surgery.    Patient is not to hold anticoag's per Dr. Diamantina Providence.

## 2021-03-22 ENCOUNTER — Other Ambulatory Visit: Payer: Self-pay | Admitting: Family Medicine

## 2021-03-22 ENCOUNTER — Encounter
Admission: RE | Admit: 2021-03-22 | Discharge: 2021-03-22 | Disposition: A | Discharge status: Home / Self Care | Payer: Medicare Other | Source: Ambulatory Visit | Attending: Urology | Admitting: Urology

## 2021-03-22 ENCOUNTER — Telehealth: Payer: Self-pay | Admitting: Urology

## 2021-03-22 ENCOUNTER — Telehealth: Payer: Self-pay | Admitting: Family Medicine

## 2021-03-22 ENCOUNTER — Other Ambulatory Visit: Payer: Self-pay

## 2021-03-22 HISTORY — DX: Polyneuropathy, unspecified: G62.9

## 2021-03-22 HISTORY — DX: Nonrheumatic mitral (valve) insufficiency: I34.0

## 2021-03-22 HISTORY — DX: Other nonrheumatic aortic valve disorders: I35.8

## 2021-03-22 HISTORY — DX: Cardiac murmur, unspecified: R01.1

## 2021-03-22 HISTORY — DX: Anemia, unspecified: D64.9

## 2021-03-22 HISTORY — DX: Personal history of urinary calculi: Z87.442

## 2021-03-22 HISTORY — DX: Prediabetes: R73.03

## 2021-03-22 MED ORDER — FUROSEMIDE 40 MG PO TABS: 40.0000 mg | ORAL_TABLET | Freq: Every day | ORAL | 3 refills | Status: DC

## 2021-03-22 NOTE — Patient Instructions (Signed)
Your procedure is scheduled on:03-23-21 Friday Report to the Registration Desk on the 1st floor of the [facility]-Arrive at 9:30 Am  REMEMBER: Instructions that are not followed completely may result in serious medical risk, up to and including death; or upon the discretion of your surgeon and anesthesiologist your surgery may need to be rescheduled.  Do not eat food OR drink liquids after midnight the night before surgery.  No gum chewing, lozengers or hard candies.  TAKE THESE MEDICATIONS THE MORNING OF SURGERY WITH A SIP OF WATER: -Amlodipine (Norvasc) -Buspar (Buspirone)  One week prior to surgery: Stop Anti-inflammatories (NSAIDS) such as Advil, Aleve, Ibuprofen, Motrin, Naproxen, Naprosyn and Aspirin based products such as Excedrin, Goodys Powder, BC Powder-You may however, continue to take Tylenol if needed for pain up until the day of surgery.  No Alcohol for 24 hours before or after surgery.  No Smoking including e-cigarettes for 24 hours prior to surgery.  No chewable tobacco products for at least 6 hours prior to surgery.  No nicotine patches on the day of surgery.  Do not use any "recreational" drugs for at least a week prior to your surgery.  Please be advised that the combination of cocaine and anesthesia may have negative outcomes, up to and including death. If you test positive for cocaine, your surgery will be cancelled.  On the morning of surgery brush your teeth with toothpaste and water, you may rinse your mouth with mouthwash if you wish. Do not swallow any toothpaste or mouthwash.  Do not wear jewelry, make-up, hairpins, clips or nail polish.  Do not wear lotions, powders, or perfumes.   Do not shave body from the neck down 48 hours prior to surgery just in case you cut yourself which could leave a site for infection.  Also, freshly shaved skin may become irritated if using the CHG soap.  Contact lenses, hearing aids and dentures may not be worn into  surgery.  Do not bring valuables to the hospital. [facility] is not responsible for any missing/lost belongings or valuables.   Notify your doctor if there is any change in your medical condition (cold, fever, infection).  Wear comfortable clothing (specific to your surgery type) to the hospital.  After surgery, you can help prevent lung complications by doing breathing exercises.  Take deep breaths and cough every 1-2 hours. Your doctor may order a device called an Incentive Spirometer to help you take deep breaths. When coughing or sneezing, hold a pillow firmly against your incision with both hands. This is called "splinting." Doing this helps protect your incision. It also decreases belly discomfort.  If you are being admitted to the hospital overnight, leave your suitcase in the car. After surgery it may be brought to your room.  If you are being discharged the day of surgery, you will not be allowed to drive home. You will need a responsible adult (18 years or older) to drive you home and stay with you that night.   If you are taking public transportation, you will need to have a responsible adult (18 years or older) with you. Please confirm with your physician that it is acceptable to use public transportation.   Please call the [facility] Dept. at [phone] if you have any questions about these instructions.  Surgery Visitation Policy:  Patients undergoing a surgery or procedure may have one family member or support person with them as long as that person is not COVID-19 positive or experiencing its symptoms.  That person may remain in the waiting area during the procedure.  Inpatient Visitation:    Visiting hours are 7 a.m. to 8 p.m. Inpatients will be allowed two visitors daily. The visitors may change each day during the patient's stay. No visitors under the age of 42. Any visitor under the age of 65 must be accompanied by an adult. The visitor must pass  COVID-19 screenings, use hand sanitizer when entering and exiting the patient's room and wear a mask at all times, including in the patient's room. Patients must also wear a mask when staff or their visitor are in the room. Masking is required regardless of vaccination status.

## 2021-03-22 NOTE — Telephone Encounter (Signed)
Pt.'s daughter called and left a message on voicemail to have Dr. Diamantina Providence call her prior to surgery (03/23/21). She has concerns about pt.'s age and anesthesia for this procedure.

## 2021-03-22 NOTE — Telephone Encounter (Signed)
Another duplicate message. See mychart response to daughter.

## 2021-03-22 NOTE — Telephone Encounter (Signed)
Duplicate message. See mychart response to daughter.

## 2021-03-22 NOTE — Telephone Encounter (Signed)
Patient's daughter has concerns about patient going under anthesia and has questions,like if there is another alternative. Please call back

## 2021-03-22 NOTE — Telephone Encounter (Signed)
Medication Refill - Medication: furosemide (LASIX) 40 MG tablet  Pt is completely out of his current supply. Pt's wife wants to know if he still needs to take this. Also if he needs to resume taking baby aspirin after his urological procedure is completed tomorrow.   Has the patient contacted their pharmacy? Yes.   (Agent: If no, request that the patient contact the pharmacy for the refill.) (Agent: If yes, when and what did the pharmacy advise?)  Preferred Pharmacy (with phone number or street name):   [facility] [address], [place] - [place]  [place] [address]  Phone: [phone] Fax: [phone]    Agent: Please be advised that RX refills may take up to 3 business days. We ask that you follow-up with your pharmacy.

## 2021-03-23 ENCOUNTER — Ambulatory Visit: Payer: Medicare Other | Admitting: Urgent Care

## 2021-03-23 ENCOUNTER — Encounter
Admission: RE | Disposition: A | Discharge status: Home / Self Care | Payer: Self-pay | Source: Home / Self Care | Attending: Urology

## 2021-03-23 ENCOUNTER — Other Ambulatory Visit: Payer: Self-pay | Admitting: Family Medicine

## 2021-03-23 ENCOUNTER — Ambulatory Visit
Admission: RE | Admit: 2021-03-23 | Discharge: 2021-03-23 | Disposition: A | Discharge status: Home / Self Care | Payer: Medicare Other | Attending: Urology | Admitting: Urology

## 2021-03-23 ENCOUNTER — Encounter: Payer: Self-pay | Admitting: Urology

## 2021-03-23 ENCOUNTER — Ambulatory Visit: Payer: Medicare Other

## 2021-03-23 ENCOUNTER — Other Ambulatory Visit: Payer: Self-pay

## 2021-03-23 DIAGNOSIS — E7800 Pure hypercholesterolemia, unspecified: Secondary | ICD-10-CM | POA: Diagnosis not present

## 2021-03-23 DIAGNOSIS — Z8711 Personal history of peptic ulcer disease: Secondary | ICD-10-CM | POA: Diagnosis not present

## 2021-03-23 DIAGNOSIS — R32 Unspecified urinary incontinence: Secondary | ICD-10-CM | POA: Diagnosis not present

## 2021-03-23 DIAGNOSIS — Z8546 Personal history of malignant neoplasm of prostate: Secondary | ICD-10-CM | POA: Insufficient documentation

## 2021-03-23 DIAGNOSIS — Z87891 Personal history of nicotine dependence: Secondary | ICD-10-CM | POA: Insufficient documentation

## 2021-03-23 DIAGNOSIS — Z823 Family history of stroke: Secondary | ICD-10-CM | POA: Insufficient documentation

## 2021-03-23 DIAGNOSIS — Z79899 Other long term (current) drug therapy: Secondary | ICD-10-CM | POA: Insufficient documentation

## 2021-03-23 DIAGNOSIS — Z9049 Acquired absence of other specified parts of digestive tract: Secondary | ICD-10-CM | POA: Insufficient documentation

## 2021-03-23 DIAGNOSIS — I10 Essential (primary) hypertension: Secondary | ICD-10-CM

## 2021-03-23 DIAGNOSIS — Z8673 Personal history of transient ischemic attack (TIA), and cerebral infarction without residual deficits: Secondary | ICD-10-CM | POA: Insufficient documentation

## 2021-03-23 DIAGNOSIS — Z809 Family history of malignant neoplasm, unspecified: Secondary | ICD-10-CM | POA: Diagnosis not present

## 2021-03-23 DIAGNOSIS — Z8042 Family history of malignant neoplasm of prostate: Secondary | ICD-10-CM | POA: Diagnosis not present

## 2021-03-23 DIAGNOSIS — Z888 Allergy status to other drugs, medicaments and biological substances status: Secondary | ICD-10-CM | POA: Diagnosis not present

## 2021-03-23 DIAGNOSIS — R310 Gross hematuria: Secondary | ICD-10-CM | POA: Diagnosis not present

## 2021-03-23 DIAGNOSIS — N201 Calculus of ureter: Secondary | ICD-10-CM

## 2021-03-23 HISTORY — PX: CYSTOSCOPY/URETEROSCOPY/HOLMIUM LASER/STENT PLACEMENT: SHX6546

## 2021-03-23 SURGERY — CYSTOSCOPY/URETEROSCOPY/HOLMIUM LASER/STENT PLACEMENT
Anesthesia: General | Laterality: Left

## 2021-03-23 MED ORDER — LIDOCAINE HCL (PF) 2 % IJ SOLN
INTRAMUSCULAR | Status: AC
  Filled 2021-03-23: qty 5

## 2021-03-23 MED ORDER — DEXAMETHASONE SODIUM PHOSPHATE 10 MG/ML IJ SOLN
INTRAMUSCULAR | Status: DC | PRN
  Administered 2021-03-23: 4 mg via INTRAVENOUS

## 2021-03-23 MED ORDER — EPHEDRINE SULFATE 50 MG/ML IJ SOLN
INTRAMUSCULAR | Status: DC | PRN
  Administered 2021-03-23: 10 mg via INTRAVENOUS
  Administered 2021-03-23 (×3): 5 mg via INTRAVENOUS

## 2021-03-23 MED ORDER — CHLORHEXIDINE GLUCONATE 0.12 % MT SOLN: 15.0000 mL | Freq: Once | OROMUCOSAL | Status: DC

## 2021-03-23 MED ORDER — FENTANYL CITRATE (PF) 100 MCG/2ML IJ SOLN: 25.0000 ug | INTRAMUSCULAR | Status: DC | PRN

## 2021-03-23 MED ORDER — ORAL CARE MOUTH RINSE: 15.0000 mL | Freq: Once | OROMUCOSAL | Status: DC

## 2021-03-23 MED ORDER — ONDANSETRON HCL 4 MG/2ML IJ SOLN
INTRAMUSCULAR | Status: DC | PRN
  Administered 2021-03-23: 4 mg via INTRAVENOUS

## 2021-03-23 MED ORDER — 0.9 % SODIUM CHLORIDE (POUR BTL) OPTIME
TOPICAL | Status: DC | PRN
  Administered 2021-03-23: 250 mL

## 2021-03-23 MED ORDER — PROPOFOL 10 MG/ML IV BOLUS
INTRAVENOUS | Status: AC
  Filled 2021-03-23: qty 20

## 2021-03-23 MED ORDER — EPHEDRINE 5 MG/ML INJ
INTRAVENOUS | Status: AC
  Filled 2021-03-23: qty 5

## 2021-03-23 MED ORDER — ACETAMINOPHEN 10 MG/ML IV SOLN
INTRAVENOUS | Status: AC
  Filled 2021-03-23: qty 100

## 2021-03-23 MED ORDER — FENTANYL CITRATE (PF) 100 MCG/2ML IJ SOLN
INTRAMUSCULAR | Status: AC
  Filled 2021-03-23: qty 2

## 2021-03-23 MED ORDER — SODIUM CHLORIDE 0.9 % IR SOLN
Status: DC | PRN
  Administered 2021-03-23: 1000 mL

## 2021-03-23 MED ORDER — CEFAZOLIN SODIUM-DEXTROSE 2-4 GM/100ML-% IV SOLN
INTRAVENOUS | Status: AC
  Filled 2021-03-23: qty 100

## 2021-03-23 MED ORDER — CEPHALEXIN 250 MG PO CAPS: 250.0000 mg | ORAL_CAPSULE | Freq: Every day | ORAL | 0 refills | Status: DC

## 2021-03-23 MED ORDER — CHLORHEXIDINE GLUCONATE 0.12 % MT SOLN
OROMUCOSAL | Status: AC
  Filled 2021-03-23: qty 15

## 2021-03-23 MED ORDER — CEFAZOLIN SODIUM-DEXTROSE 2-4 GM/100ML-% IV SOLN
2.0000 g | INTRAVENOUS | Status: AC
  Administered 2021-03-23: 2 g via INTRAVENOUS

## 2021-03-23 MED ORDER — IOHEXOL 180 MG/ML  SOLN
INTRAMUSCULAR | Status: DC | PRN
  Administered 2021-03-23: 10 mL

## 2021-03-23 MED ORDER — ONDANSETRON HCL 4 MG/2ML IJ SOLN
INTRAMUSCULAR | Status: AC
  Filled 2021-03-23: qty 2

## 2021-03-23 MED ORDER — HYDROCODONE-ACETAMINOPHEN 7.5-325 MG PO TABS: 1.0000 | ORAL_TABLET | Freq: Once | ORAL | Status: DC | PRN

## 2021-03-23 MED ORDER — LACTATED RINGERS IV SOLN: INTRAVENOUS | Status: DC

## 2021-03-23 MED ORDER — BELLADONNA ALKALOIDS-OPIUM 16.2-60 MG RE SUPP
RECTAL | Status: AC
  Filled 2021-03-23: qty 1

## 2021-03-23 MED ORDER — DEXAMETHASONE SODIUM PHOSPHATE 10 MG/ML IJ SOLN
INTRAMUSCULAR | Status: AC
  Filled 2021-03-23: qty 1

## 2021-03-23 MED ORDER — LIDOCAINE HCL (CARDIAC) PF 100 MG/5ML IV SOSY
PREFILLED_SYRINGE | INTRAVENOUS | Status: DC | PRN
  Administered 2021-03-23: 80 mg via INTRAVENOUS

## 2021-03-23 MED ORDER — FENTANYL CITRATE (PF) 100 MCG/2ML IJ SOLN
INTRAMUSCULAR | Status: DC | PRN
  Administered 2021-03-23: 25 ug via INTRAVENOUS

## 2021-03-23 MED ORDER — PROPOFOL 10 MG/ML IV BOLUS
INTRAVENOUS | Status: DC | PRN
  Administered 2021-03-23: 100 mg via INTRAVENOUS

## 2021-03-23 MED ORDER — ACETAMINOPHEN 10 MG/ML IV SOLN
INTRAVENOUS | Status: DC | PRN
Start: 2021-03-23 — End: 2021-03-23
  Administered 2021-03-23: 1000 mg via INTRAVENOUS

## 2021-03-23 MED ORDER — CHLORHEXIDINE GLUCONATE 0.12 % MT SOLN
15.0000 mL | Freq: Once | OROMUCOSAL | Status: AC
  Administered 2021-03-23: 15 mL via OROMUCOSAL

## 2021-03-23 MED ORDER — ORAL CARE MOUTH RINSE: 15.0000 mL | Freq: Once | OROMUCOSAL | Status: AC

## 2021-03-23 MED ORDER — HYDROCODONE-ACETAMINOPHEN 5-325 MG PO TABS: 0.5000 | ORAL_TABLET | ORAL | 0 refills | Status: AC | PRN

## 2021-03-23 SURGICAL SUPPLY — 35 items
ADH LQ OCL WTPRF AMP STRL LF (MISCELLANEOUS) ×1
ADHESIVE MASTISOL STRL (MISCELLANEOUS) ×2 IMPLANT
BAG DRAIN CYSTO-URO LG1000N (MISCELLANEOUS) ×2 IMPLANT
BASKET ZERO TIP 1.9FR (BASKET) ×2 IMPLANT
BRUSH SCRUB EZ 1% IODOPHOR (MISCELLANEOUS) ×2 IMPLANT
BSKT STON RTRVL ZERO TP 1.9FR (BASKET) ×1
CATH URET FLEX-TIP 2 LUMEN 10F (CATHETERS) IMPLANT
CATH URETL OPEN 5X70 (CATHETERS) IMPLANT
CNTNR SPEC 2.5X3XGRAD LEK (MISCELLANEOUS)
CONT SPEC 4OZ STER OR WHT (MISCELLANEOUS)
CONT SPEC 4OZ STRL OR WHT (MISCELLANEOUS)
CONTAINER SPEC 2.5X3XGRAD LEK (MISCELLANEOUS) IMPLANT
DRAPE UTILITY 15X26 TOWEL STRL (DRAPES) ×2 IMPLANT
DRSG TEGADERM 2-3/8X2-3/4 SM (GAUZE/BANDAGES/DRESSINGS) ×4 IMPLANT
GAUZE 4X4 16PLY ~~LOC~~+RFID DBL (SPONGE) ×4 IMPLANT
GLOVE SURG UNDER POLY LF SZ7.5 (GLOVE) ×2 IMPLANT
GOWN STRL REUS W/ TWL LRG LVL3 (GOWN DISPOSABLE) ×1 IMPLANT
GOWN STRL REUS W/ TWL XL LVL3 (GOWN DISPOSABLE) ×1 IMPLANT
GOWN STRL REUS W/TWL LRG LVL3 (GOWN DISPOSABLE) ×2
GOWN STRL REUS W/TWL XL LVL3 (GOWN DISPOSABLE) ×2
GUIDEWIRE STR DUAL SENSOR (WIRE) ×2 IMPLANT
INFUSOR MANOMETER BAG 3000ML (MISCELLANEOUS) ×2 IMPLANT
IV NS IRRIG 3000ML ARTHROMATIC (IV SOLUTION) ×2 IMPLANT
KIT TURNOVER CYSTO (KITS) ×2 IMPLANT
PACK CYSTO AR (MISCELLANEOUS) ×2 IMPLANT
SET CYSTO W/LG BORE CLAMP LF (SET/KITS/TRAYS/PACK) ×2 IMPLANT
SHEATH URETERAL 12FRX35CM (MISCELLANEOUS) IMPLANT
STENT URET 6FRX24 CONTOUR (STENTS) ×2 IMPLANT
STENT URET 6FRX26 CONTOUR (STENTS) IMPLANT
SURGILUBE 2OZ TUBE FLIPTOP (MISCELLANEOUS) ×2 IMPLANT
SYR 10ML LL (SYRINGE) ×2 IMPLANT
TRACTIP FLEXIVA PULSE ID 200 (Laser) ×2 IMPLANT
VALVE UROSEAL ADJ ENDO (VALVE) IMPLANT
WATER STERILE IRR 1000ML POUR (IV SOLUTION) ×2 IMPLANT
WATER STERILE IRR 500ML POUR (IV SOLUTION) ×2 IMPLANT

## 2021-03-23 NOTE — Interval H&P Note (Signed)
UROLOGY H&P UPDATE  Agree with prior H&P dated 03/16/2021.  87 year old male with recurrent culture proven UTIs and gross hematuria, and CT urogram showing a 3 mm left distal ureteral stone with some subtle left possible ureteral enhancement.  We had a long conversation about options including observation versus ureteroscopy, and with his recurrent infections and recurrent gross hematuria, he opted for ureteroscopy.  Cardiac: RRR Lungs: CTA bilaterally  Laterality: Left Procedure: Cystoscopy, possible bladder biopsy, left ureteroscopy, laser lithotripsy, left ureteral stent placement  Urine: Culture 9/9 no growth  We specifically discussed the risks ureteroscopy including bleeding, infection/sepsis, stent related symptoms including flank pain/urgency/frequency/incontinence/dysuria, ureteral injury, inability to access stone, or need for staged or additional procedures.   Billey Co, MD 03/23/2021

## 2021-03-23 NOTE — Anesthesia Preprocedure Evaluation (Signed)
Anesthesia Evaluation  Patient identified by MRN, date of birth, ID band Patient awake    Reviewed: Allergy & Precautions, NPO status , Patient's Chart, lab work & pertinent test results  Airway Mallampati: II  TM Distance: >3 FB Neck ROM: full    Dental no notable dental hx.    Pulmonary neg pulmonary ROS, former smoker,    Pulmonary exam normal        Cardiovascular hypertension, Pt. 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  Neuromuscular disease CVA, Residual Symptoms negative neurological ROS  negative psych ROS   GI/Hepatic negative GI ROS, Neg liver ROS, PUD, GERD  Controlled,  Endo/Other  negative endocrine ROS  Renal/GU      Musculoskeletal   Abdominal Normal abdominal exam  (+)   Peds  Hematology negative hematology ROS (+) Blood dyscrasia, anemia ,   Anesthesia Other Findings Past Medical History: No date: Anemia No date: Aortic valve sclerosis No date: Arthritis No date: Bladder outlet obstruction No date: Erectile dysfunction No date: GERD (gastroesophageal reflux disease)     Comment:  h/o No date: Hard of hearing No date: HBP (high blood pressure) No date: Heart murmur No date: History of kidney stones No date: History of nephrolithiasis No date: HLD (hyperlipidemia) No date: Incomplete bladder emptying No date: Mitral regurgitation No date: Neuropathy No date: Nocturia No date: Osteoporosis No date: Pre-diabetes No date: Prostate cancer ([place]) 07/08/2012: Shingles No date: Sinus problem No date: Stomach ulcer 07/08/2010: Stroke ([place]) No date: Urinary frequency No date: Urinary leakage  Past Surgical History: 2000, 2001: CATARACT EXTRACTION 07/08/1970:  CHOLECYSTECTOMY 2008/2009: PROSTATE SURGERY     Comment:  cryotherapy No date: TONSILLECTOMY No date: UMBILICAL HERNIA REPAIR  BMI    Body Mass Index: 23.48 kg/m      Reproductive/Obstetrics negative OB ROS                             Anesthesia Physical Anesthesia Plan  ASA: 3  Anesthesia Plan: General LMA   Post-op Pain Management:    Induction: Intravenous  PONV Risk Score and Plan: Dexamethasone, Ondansetron, Midazolam and Treatment may vary due to age or medical condition  Airway Management Planned: LMA  Additional Equipment:   Intra-op Plan:   Post-operative Plan: Extubation in OR  Informed Consent: I have reviewed the patients History and Physical, chart, labs and discussed the procedure including the risks, benefits and alternatives for the proposed anesthesia with the patient or authorized representative who has indicated his/her understanding and acceptance.     Dental Advisory Given  Plan Discussed with: Anesthesiologist, CRNA and Surgeon  Anesthesia Plan Comments: (Patient consented for risks of anesthesia including but not limited to:  - adverse reactions to medications - damage to eyes, teeth, lips or other oral mucosa - nerve damage due to positioning  - sore throat or hoarseness - Damage to heart, brain, nerves, lungs, other parts of body or loss of life  Patient voiced understanding.)        Anesthesia Quick Evaluation

## 2021-03-23 NOTE — Anesthesia Procedure Notes (Signed)
Procedure Name: LMA Insertion Date/Time: 03/23/2021 10:40 AM Performed by: Demetrius Charity, CRNA Pre-anesthesia Checklist: Patient identified, Patient being monitored, Timeout performed, Emergency Drugs available and Suction available Patient Re-evaluated:Patient Re-evaluated prior to induction Oxygen Delivery Method: Circle system utilized Preoxygenation: Pre-oxygenation with 100% oxygen Induction Type: IV induction Ventilation: Mask ventilation without difficulty LMA: LMA inserted LMA Size: 4.0 Tube type: Oral Number of attempts: 1 Placement Confirmation: positive ETCO2 and breath sounds checked- equal and bilateral Tube secured with: Tape Dental Injury: Teeth and Oropharynx as per pre-operative assessment

## 2021-03-23 NOTE — Op Note (Signed)
Date of procedure: 03/23/21  Preoperative diagnosis:  Left distal ureteral stone  Postoperative diagnosis:  Same  Procedure: Cystoscopy, left ureteroscopy, laser lithotripsy, basket extraction of fragments, left retrograde pyelogram with intraoperative interpretation, left ureteral stent placement  Surgeon: Nickolas Madrid, MD  Anesthesia: General  Complications: None  Intraoperative findings:  1.  Normal cystoscopy, no suspicious lesions 2.  Left 4 mm ureteral stone fragmented and basket extracted, stent placed  EBL: Minimal  Specimens: None  Drains: 6 French by 24 cm ureteral stent  Indication: [name] is a 87 y.o. patient with recurrent UTIs, gross hematuria, and CT showing a 4 mm left distal ureteral stone.  After reviewing the management options for treatment, they elected to proceed with the above surgical procedure(s). We have discussed the potential benefits and risks of the procedure, side effects of the proposed treatment, the likelihood of the patient achieving the goals of the procedure, and any potential problems that might occur during the procedure or recuperation. Informed consent has been obtained.  Description of procedure:  The patient was taken to the operating room and general anesthesia was induced. SCDs were placed for DVT prophylaxis. The patient was placed in the dorsal lithotomy position, prepped and draped in the usual sterile fashion, and preoperative antibiotics were administered. A preoperative time-out was performed.   A 21 French rigid cystoscope was used to intubate the urethra and a normal urethra was followed proximally to the bladder.  The prostate was small.  Thorough cystoscopy showed moderate trabeculations, but no suspicious lesions, and the ureteral orifices were orthotopic bilaterally.  A sensor wire passed easily into the left ureteral orifice and passed up to the kidney under fluoroscopic vision.  With the aid of a second sensor  wire, I was able to prop open the ureteral orifice and advanced the semirigid ureteroscope into the distal ureter.  This was extremely tight.  Within a few centimeters of the orifice I identified a yellow 4 mm stone.  A 200 m laser fiber was used to fragment the stone into 2 pieces, and these were both basket extracted and deposited in the bladder.  Thorough ureteroscopy showed no other stone fragments.  A retrograde pyelogram was performed from the distal ureter and showed no extravasation or filling defects.  The rigid cystoscope was backloaded over the wire and a 6 [place] by 24 cm ureteral stent was uneventfully placed with a curl in the renal pelvis, as well as under direct vision the bladder.  The bladder was drained, and the Dangler was secured to the dorsal aspect of the penis with Mastisol and Tegaderm.  Disposition: Stable to PACU  Plan: Remove stent at home on Wednesday 9/21, low-dose Keflex prophylaxis with stent in place  Nickolas Madrid, MD

## 2021-03-23 NOTE — Discharge Instructions (Signed)

## 2021-03-23 NOTE — Anesthesia Postprocedure Evaluation (Signed)
Anesthesia Post Note  Patient: Patton M Chern  Procedure(s) Performed: CYSTOSCOPY/URETEROSCOPY/HOLMIUM LASER/STENT PLACEMENT (Left)  Patient location during evaluation: PACU Anesthesia Type: General Level of consciousness: awake and alert Pain management: pain level controlled Vital Signs Assessment: post-procedure vital signs reviewed and stable Respiratory status: spontaneous breathing, nonlabored ventilation, respiratory function stable and patient connected to nasal cannula oxygen Cardiovascular status: blood pressure returned to baseline and stable Postop Assessment: no apparent nausea or vomiting Anesthetic complications: no   No notable events documented.   Last Vitals:  Vitals:   03/23/21 1138 03/23/21 1149  BP: 124/68 125/66  Pulse: 79 (!) 59  Resp: 20 18  Temp: 36.4 C 36.9 C  SpO2: 97% 97%    Last Pain:  Vitals:   03/23/21 1149  TempSrc: Temporal  PainSc: 0-No pain                 Margaree Mackintosh

## 2021-03-23 NOTE — Transfer of Care (Signed)
Immediate Anesthesia Transfer of Care Note  Patient: [name]  Procedure(s) Performed: CYSTOSCOPY/URETEROSCOPY/HOLMIUM LASER/STENT PLACEMENT (Left)  Patient Location: PACU  Anesthesia Type:General  Level of Consciousness: drowsy  Airway & Oxygen Therapy: Patient Spontanous Breathing and Patient connected to face mask oxygen  Post-op Assessment: Report given to RN and Post -op Vital signs reviewed and stable  Post vital signs: Reviewed and stable  Last Vitals:  Vitals Value Taken Time  BP 126/62 03/23/21 1106  Temp    Pulse 72 03/23/21 1109  Resp 11 03/23/21 1109  SpO2 100 % 03/23/21 1109  Vitals shown include unvalidated device data.  Last Pain:  Vitals:   03/23/21 1013  TempSrc: Oral  PainSc: 0-No pain         Complications: No notable events documented.

## 2021-03-24 ENCOUNTER — Encounter: Payer: Self-pay | Admitting: Urology

## 2021-03-24 ENCOUNTER — Other Ambulatory Visit: Payer: Self-pay

## 2021-03-24 DIAGNOSIS — R109 Unspecified abdominal pain: Secondary | ICD-10-CM | POA: Diagnosis not present

## 2021-03-24 DIAGNOSIS — M4316 Spondylolisthesis, lumbar region: Secondary | ICD-10-CM | POA: Diagnosis not present

## 2021-03-24 DIAGNOSIS — Z79899 Other long term (current) drug therapy: Secondary | ICD-10-CM | POA: Insufficient documentation

## 2021-03-24 DIAGNOSIS — R310 Gross hematuria: Secondary | ICD-10-CM | POA: Insufficient documentation

## 2021-03-24 DIAGNOSIS — Z87891 Personal history of nicotine dependence: Secondary | ICD-10-CM | POA: Insufficient documentation

## 2021-03-24 DIAGNOSIS — Z8546 Personal history of malignant neoplasm of prostate: Secondary | ICD-10-CM | POA: Diagnosis not present

## 2021-03-24 DIAGNOSIS — I10 Essential (primary) hypertension: Secondary | ICD-10-CM | POA: Diagnosis not present

## 2021-03-24 DIAGNOSIS — R1030 Lower abdominal pain, unspecified: Secondary | ICD-10-CM | POA: Diagnosis not present

## 2021-03-24 DIAGNOSIS — I700 Atherosclerosis of aorta: Secondary | ICD-10-CM | POA: Diagnosis not present

## 2021-03-24 DIAGNOSIS — K7689 Other specified diseases of liver: Secondary | ICD-10-CM | POA: Diagnosis not present

## 2021-03-24 LAB — CBC WITH DIFFERENTIAL/PLATELET
Abs Immature Granulocytes: 0.06 10*3/uL (ref 0.00–0.07)
Basophils Absolute: 0 10*3/uL (ref 0.0–0.1)
Basophils Relative: 0 %
Eosinophils Absolute: 0.1 10*3/uL (ref 0.0–0.5)
Eosinophils Relative: 1 %
HCT: 42.4 % (ref 39.0–52.0)
Hemoglobin: 14.2 g/dL (ref 13.0–17.0)
Immature Granulocytes: 1 %
Lymphocytes Relative: 6 %
Lymphs Abs: 0.5 10*3/uL — ABNORMAL LOW (ref 0.7–4.0)
MCH: 31.8 pg (ref 26.0–34.0)
MCHC: 33.5 g/dL (ref 30.0–36.0)
MCV: 95.1 fL (ref 80.0–100.0)
Monocytes Absolute: 0.7 10*3/uL (ref 0.1–1.0)
Monocytes Relative: 8 %
Neutro Abs: 7.5 10*3/uL (ref 1.7–7.7)
Neutrophils Relative %: 84 %
Platelets: 168 10*3/uL (ref 150–400)
RBC: 4.46 MIL/uL (ref 4.22–5.81)
RDW: 13.9 % (ref 11.5–15.5)
WBC: 8.8 10*3/uL (ref 4.0–10.5)
nRBC: 0 % (ref 0.0–0.2)

## 2021-03-24 LAB — COMPREHENSIVE METABOLIC PANEL
ALT: 17 U/L (ref 0–44)
AST: 32 U/L (ref 15–41)
Albumin: 4 g/dL (ref 3.5–5.0)
Alkaline Phosphatase: 57 U/L (ref 38–126)
Anion gap: 10 (ref 5–15)
BUN: 27 mg/dL — ABNORMAL HIGH (ref 8–23)
CO2: 25 mmol/L (ref 22–32)
Calcium: 10.3 mg/dL (ref 8.9–10.3)
Chloride: 98 mmol/L (ref 98–111)
Creatinine, Ser: 0.89 mg/dL (ref 0.61–1.24)
GFR, Estimated: >60 mL/min (ref >60)
Glucose, Bld: 142 mg/dL — ABNORMAL HIGH (ref 70–99)
Potassium: 4.6 mmol/L (ref 3.5–5.1)
Sodium: 133 mmol/L — ABNORMAL LOW (ref 135–145)
Total Bilirubin: 0.9 mg/dL (ref 0.3–1.2)
Total Protein: 6.8 g/dL (ref 6.5–8.1)

## 2021-03-24 NOTE — ED Triage Notes (Signed)
Patient reports left flank pain.  Recently had stent placed due to kidney stone.  Patient reports some difficulty urinating.

## 2021-03-24 NOTE — ED Triage Notes (Signed)
First RN Note: pt to ED via POV with his wife, pt had cystoscopy/stent placement yesterday for kidney stone, now c/o abdominal pain at this time.

## 2021-03-24 NOTE — Progress Notes (Signed)
Called pt for post op check, pt stated that he accidentally pulled string out about 5 inches, Dr Jeffie Pollock called and he instructed me to have pt pull string completely out. Pt stated that he has bloody urine. Instructed pt to go to ED if unable to urinate or experiencing more bleeding

## 2021-03-25 ENCOUNTER — Emergency Department: Payer: Medicare Other

## 2021-03-25 ENCOUNTER — Emergency Department
Admission: EM | Admit: 2021-03-25 | Discharge: 2021-03-25 | Disposition: A | Discharge status: Home / Self Care | Payer: Medicare Other | Attending: Emergency Medicine | Admitting: Emergency Medicine

## 2021-03-25 DIAGNOSIS — I700 Atherosclerosis of aorta: Secondary | ICD-10-CM | POA: Diagnosis not present

## 2021-03-25 DIAGNOSIS — R109 Unspecified abdominal pain: Secondary | ICD-10-CM

## 2021-03-25 DIAGNOSIS — K7689 Other specified diseases of liver: Secondary | ICD-10-CM | POA: Diagnosis not present

## 2021-03-25 DIAGNOSIS — R310 Gross hematuria: Secondary | ICD-10-CM

## 2021-03-25 DIAGNOSIS — M4316 Spondylolisthesis, lumbar region: Secondary | ICD-10-CM | POA: Diagnosis not present

## 2021-03-25 LAB — URINALYSIS, ROUTINE W REFLEX MICROSCOPIC
Bacteria, UA: NONE SEEN
Specific Gravity, Urine: 1.025 (ref 1.005–1.030)
Squamous Epithelial / HPF: NONE SEEN (ref 0–5)
WBC, UA: NONE SEEN WBC/hpf (ref 0–5)

## 2021-03-25 NOTE — ED Provider Notes (Signed)
[facility] Emergency Department Provider Note  ____________________________________________   Event Date/Time   First MD Initiated Contact with Patient 03/25/21 [phone]     (approximate)  I have reviewed the triage vital signs and the nursing notes.   HISTORY  Chief Complaint Flank Pain    HPI [name] is a 87 y.o. male with history of previous stroke, hyperlipidemia, kidney stones, prostate cancer who presents to the emergency department complaints of abdominal pain.  Patient underwent cystoscopy, left ureteroscopy, laser lithotripsy, basket extraction of fragments, left retrograde pyelogram with left ureteral stent placement by Dr. Diamantina Providence on 03/23/21.  States yesterday he started having increasing pain and states that he accidentally pulled his stent out about 5 inches.  On-call urologist recommended that he pulled the stent out all of the way which he did at home.  States he was still having pain but he took Aleve and now is pain-free.  States he did have gross hematuria but this is slowly improving and now is pink in color instead of bright red.  He feels he is able to empty his bladder.  He has not passing clots.  No fevers, vomiting or diarrhea.  He is on Keflex currently.  Wife reports that they were given a prescription for pain medication but she did not fill this medicine at the pharmacy because she states "I not believe in it".        Past Medical History:  Diagnosis Date   Anemia    Aortic valve sclerosis    Arthritis    Bladder outlet obstruction    Erectile dysfunction    GERD (gastroesophageal reflux disease)    h/o   Hard of hearing    HBP (high blood pressure)    Heart murmur    History of kidney stones    History of nephrolithiasis    HLD (hyperlipidemia)    Incomplete bladder emptying    Mitral regurgitation    Neuropathy    Nocturia    Osteoporosis    Pre-diabetes    Prostate cancer ([name])    Shingles 07/08/2012   Sinus  problem    Stomach ulcer    Stroke ([name]) 07/08/2010   Urinary frequency    Urinary leakage     Patient Active Problem List   Diagnosis Date Noted   Gross hematuria 03/13/2021   Clavi 01/18/2021   Pre-ulcerative calluses 01/18/2021   Hypervolemia 10/06/2020   Foot callus 10/06/2020   Peripheral neuropathy 08/22/2020   Mitral regurgitation and aortic stenosis 06/27/2020   Encounter for annual physical exam 06/15/2020   Murmur, cardiac 06/15/2020   Recurrent major depressive disorder, in full remission ([place]) 06/15/2020   Calf swelling 06/15/2020   Porokeratosis 03/16/2020   Adjustment disorder with mixed anxiety and depressed mood 06/17/2019   Memory difficulties 06/17/2019   Senile purpura ([name]) 05/21/2018   Frequent falls 04/01/2017   Constipation 03/31/2017   Eczema 02/12/2017   Left leg pain 02/12/2017   Urinary frequency 09/18/2015   Allergic rhinitis 06/27/2015   H/O: CVA (cerebrovascular accident) 06/27/2015   DD (diverticular disease) 06/27/2015   Benign essential HTN 06/27/2015   Hypercholesterolemia 06/27/2015   Fungal infection of nail 06/27/2015   Prediabetes 03/15/2015   Prostate cancer ([name]) 12/18/2014   Arthritis, degenerative 01/12/2014   H/O malignant neoplasm of prostate 12/03/2012   Urge incontinence 11/09/2012   Obstruction of urinary tract 11/09/2012   Dermatophytosis of groin 09/29/2012   ED (erectile dysfunction) of organic origin 09/29/2012  Incomplete bladder emptying 09/29/2012    Past Surgical History:  Procedure Laterality Date   CATARACT EXTRACTION  2000, 2001   CHOLECYSTECTOMY  07/08/1970   CYSTOSCOPY/URETEROSCOPY/HOLMIUM LASER/STENT PLACEMENT Left 03/23/2021   Procedure: CYSTOSCOPY/URETEROSCOPY/HOLMIUM LASER/STENT PLACEMENT;  Surgeon: Billey Co, MD;  Location: [facility] ORS;  Service: Urology;  Laterality: Left;   PROSTATE SURGERY  2008/2009   cryotherapy   TONSILLECTOMY     UMBILICAL HERNIA REPAIR      Prior to Admission  medications   Medication Sig Start Date End Date Taking? Authorizing Provider  amLODipine (NORVASC) 5 MG tablet Take 1 tablet by mouth once daily 03/23/21   [name], MD  busPIRone (BUSPAR) 5 MG tablet Take 1 tablet by mouth twice daily 03/15/21   [name], MD  cephALEXin (KEFLEX) 250 MG capsule Take 1 capsule (250 mg total) by mouth daily. 03/23/21   Billey Co, MD  fluticasone (FLONASE) 50 MCG/ACT nasal spray Place 1 spray into both nostrils daily. 11/27/20   [name], MD  furosemide (LASIX) 40 MG tablet Take 1 tablet (40 mg total) by mouth daily. Patient taking differently: Take 40 mg by mouth every morning. 03/22/21   [name], MD  gabapentin (NEURONTIN) 100 MG capsule Take 1 capsule by mouth at bedtime 01/07/21   [name], MD  HYDROcodone-acetaminophen (NORCO/VICODIN) 5-325 MG tablet Take 0.5-1 tablets by mouth every 4 (four) hours as needed for up to 3 days for moderate pain. 03/23/21 03/26/21  Billey Co, MD  Multiple Vitamin (MULTI-VITAMIN) tablet Take 1 tablet by mouth daily.     [provider]  naproxen sodium (ALEVE) 220 MG tablet Take 220 mg by mouth daily as needed (pain/headache).    [provider]  Polyethyl Glycol-Propyl Glycol (SYSTANE OP) Place 1 drop into both eyes daily as needed (dry eye).    [provider]    Allergies Toviaz [fesoterodine fumarate er]  Family History  Problem Relation Age of Onset   Cancer Mother    Stroke Father    Stroke Sister    Prostate cancer Brother    Kidney disease Neg Hx    Kidney cancer Neg Hx    Bladder Cancer Neg Hx     Social History Social History   Tobacco Use   Smoking status: Former    Years: 10.00    Types: Cigarettes    Quit date: 07/08/1962    Years since quitting: 58.7   Smokeless tobacco: Never   Tobacco comments:    quit 1964  Vaping Use   Vaping Use: Never used  Substance Use Topics   Alcohol use: Yes    Alcohol/week:  1.0 - 2.0 standard drink    Types: 1 Glasses of wine per week    Comment: every 2-3 mths, 1 glass of wine daily     Drug use: No    Review of Systems Constitutional: No fever. Eyes: No visual changes. ENT: No sore throat. Cardiovascular: Denies chest pain. Respiratory: Denies shortness of breath. Gastrointestinal: No nausea, vomiting, diarrhea. Genitourinary: Negative for dysuria. Musculoskeletal: Negative for back pain. Skin: Negative for rash. Neurological: Negative for focal weakness or numbness.  ____________________________________________   PHYSICAL EXAM:  VITAL SIGNS: ED Triage Vitals  Enc Vitals Group     BP 03/24/21 2236 (!) 147/87     Pulse Rate 03/24/21 2236 62     Resp 03/24/21 2236 20     Temp 03/24/21 2236 97.9 F (36.6 C)  Temp Source 03/24/21 2236 Oral     SpO2 03/24/21 2236 96 %     Weight 03/24/21 2237 146 lb (66.2 kg)     Height 03/24/21 2237 '5\' 9"'$  (1.753 m)     Head Circumference --      Peak Flow --      Pain Score --      Pain Loc --      Pain Edu? --      Excl. in [place]? --    CONSTITUTIONAL: Alert and oriented and responds appropriately to questions. Well-appearing; well-nourished, afebrile, nontoxic, elderly, hard of hearing HEAD: Normocephalic EYES: Conjunctivae clear, pupils appear equal, EOM appear intact ENT: normal nose; moist mucous membranes NECK: Supple, normal ROM CARD: RRR; S1 and S2 appreciated; no murmurs, no clicks, no rubs, no gallops RESP: Normal chest excursion without splinting or tachypnea; breath sounds clear and equal bilaterally; no wheezes, no rhonchi, no rales, no hypoxia or respiratory distress, speaking full sentences ABD/GI: Normal bowel sounds; non-distended; soft, non-tender, no rebound, no guarding, no peritoneal signs, no hepatosplenomegaly BACK: The back appears normal EXT: Normal ROM in all joints; no deformity noted, no edema; no cyanosis SKIN: Normal color for age and race; warm; no rash on exposed  skin NEURO: Moves all extremities equally, ambulates with steady gait PSYCH: The patient's mood and manner are appropriate.  ____________________________________________   LABS (all labs ordered are listed, but only abnormal results are displayed)  Labs Reviewed  CBC WITH DIFFERENTIAL/PLATELET - Abnormal; Notable for the following components:      Result Value   Lymphs Abs 0.5 (*)    All other components within normal limits  COMPREHENSIVE METABOLIC PANEL - Abnormal; Notable for the following components:   Sodium 133 (*)    Glucose, Bld 142 (*)    BUN 27 (*)    All other components within normal limits  URINALYSIS, ROUTINE W REFLEX MICROSCOPIC - Abnormal; Notable for the following components:   Color, Urine RED (*)    APPearance BLOODY (*)    Glucose, UA   (*)    Value: TEST NOT REPORTED DUE TO COLOR INTERFERENCE OF URINE PIGMENT   Hgb urine dipstick   (*)    Value: TEST NOT REPORTED DUE TO COLOR INTERFERENCE OF URINE PIGMENT   Bilirubin Urine   (*)    Value: TEST NOT REPORTED DUE TO COLOR INTERFERENCE OF URINE PIGMENT   Ketones, ur   (*)    Value: TEST NOT REPORTED DUE TO COLOR INTERFERENCE OF URINE PIGMENT   Protein, ur   (*)    Value: TEST NOT REPORTED DUE TO COLOR INTERFERENCE OF URINE PIGMENT   Nitrite   (*)    Value: TEST NOT REPORTED DUE TO COLOR INTERFERENCE OF URINE PIGMENT   Leukocytes,Ua   (*)    Value: TEST NOT REPORTED DUE TO COLOR INTERFERENCE OF URINE PIGMENT   All other components within normal limits  URINE CULTURE   ____________________________________________  EKG   ____________________________________________  RADIOLOGY I, Syndi Pua, personally viewed and evaluated these images (plain radiographs) as part of my medical decision making, as well as reviewing the written report by the radiologist.  ED MD interpretation: CT shows no acute abnormality.  Official radiology report(s): CT Renal Stone Study  Result Date: 03/25/2021 CLINICAL DATA:   Left flank pain EXAM: CT ABDOMEN AND PELVIS WITHOUT CONTRAST TECHNIQUE: Multidetector CT imaging of the abdomen and pelvis was performed following the standard protocol without IV contrast. COMPARISON:  03/19/2021 FINDINGS:  LOWER CHEST: Slight worsening of left basilar tree-in-bud opacities. HEPATOBILIARY: Multiple hepatic cysts. No acute abnormality. Status post cholecystectomy. PANCREAS: Normal pancreas. No ductal dilatation or peripancreatic fluid collection. SPLEEN: Normal. ADRENALS/URINARY TRACT: The adrenal glands are normal. No hydronephrosis, nephroureterolithiasis or solid renal mass. Left-sided distal ureteral calculus is no longer present. The urinary bladder is normal for degree of distention STOMACH/BOWEL: There is no hiatal hernia. Normal duodenal course and caliber. No small bowel dilatation or inflammation. No focal colonic abnormality. Normal appendix. VASCULAR/LYMPHATIC: There is calcific atherosclerosis of the abdominal aorta. No lymphadenopathy. REPRODUCTIVE: There are calcifications within the normal-sized prostate. Symmetric seminal vesicles. MUSCULOSKELETAL. Grade 1 anterolisthesis at L4-5. OTHER: None. IMPRESSION: 1. No obstructive uropathy or nephroureterolithiasis. Previously seen distal left ureteral calculus is no longer present. 2. Slight worsening of left basilar tree-in-bud opacities, which may indicate endobronchial infection or inflammation. Aortic Atherosclerosis (ICD10-I70.0). Electronically Signed   By: Ulyses Jarred M.D.   On: 03/25/2021 03:00    ____________________________________________   PROCEDURES  Procedure(s) performed (including Critical Care):  Procedures    ____________________________________________   INITIAL IMPRESSION / ASSESSMENT AND PLAN / ED COURSE  As part of my medical decision making, I reviewed the following data within the [place] History obtained from family, Nursing notes reviewed and incorporated, Labs reviewed , Old  chart reviewed, CT reviewed, Notes from prior ED visits, and [place] Controlled Substance Database         Patient here with abdominal pain and gross hematuria after having laser lithotripsy, basket extraction, stent placement 2 days ago by urology for a distal ureteral stone on the left.  Patient states that he removed his ureteral stent at home prior to arrival.  Unfortunately due to high acuity, holding in the emergency department, patient was in the waiting room for quite some time.  He states his pain is now completely resolved.  He also reports a gross hematuria that he was having has significantly improved.  He is able to empty his bladder with no signs or symptoms of urinary retention on exam currently.  His abdominal exam is benign.  He is afebrile.  Labs are reassuring.  CT scan shows no new acute abnormality.  Radiology read the CT as showing some left lung tree-in-bud opacities but he denies any chest pain, shortness of breath, fever, cough.  I have encouraged him to continue the antibiotics that he was prescribed by his urologist and have also encouraged his wife to pick up the pain medication that he was prescribed by his urologist so that he has something stronger at home to take if needed.  We discussed at length return precautions.  I feel he is safe to be discharged home and he is also comfortable with this plan given he is feeling much better.  Recommended follow-up with his urologist as scheduled.  At this time, I do not feel there is any life-threatening condition present. I have reviewed, interpreted and discussed all results (EKG, imaging, lab, urine as appropriate) and exam findings with patient/family. I have reviewed nursing notes and appropriate previous records.  I feel the patient is safe to be discharged home without further emergent workup and can continue workup as an outpatient as needed. Discussed usual and customary return precautions. Patient/family verbalize understanding and  are comfortable with this plan.  Outpatient follow-up has been provided as needed. All questions have been answered.  ____________________________________________   FINAL CLINICAL IMPRESSION(S) / ED DIAGNOSES  Final diagnoses:  Abdominal pain, unspecified abdominal location  Gross hematuria     ED Discharge Orders     None       *Please note:  Jesus MICHAELSEN was evaluated in Emergency Department on 03/25/2021 for the symptoms described in the history of present illness. He was evaluated in the context of the global COVID-19 pandemic, which necessitated consideration that the patient might be at risk for infection with the SARS-CoV-2 virus that causes COVID-19. Institutional protocols and algorithms that pertain to the evaluation of patients at risk for COVID-19 are in a state of rapid change based on information released by regulatory bodies including the CDC and federal and state organizations. These policies and algorithms were followed during the patient's care in the ED.  Some ED evaluations and interventions may be delayed as a result of limited staffing during and the pandemic.*   Note:  This document was prepared using Dragon voice recognition software and may include unintentional dictation errors.    Madolin Twaddle, Delice Bison, DO 03/25/21 [phone]

## 2021-03-25 NOTE — Discharge Instructions (Addendum)
You were seen in the emergency department for abdominal pain that has improved.  Your labs, CT scan today were reassuring.  You have no sign of swelling of your kidney or residual kidney stone.  I recommend that you increase your water intake over the next several days to help clear your urine.  I recommend that you continue taking your antibiotic as prescribed by your urologist.  I also recommend that you fill the prescription for pain medication that was sent to your pharmacy so that you have something stronger at home if you need it.  If you begin having increased blood with urination, are unable to urinate or empty your bladder, fever of 100.4 or higher, vomiting that does not stop or pain that is not controlled with your pain medication that was prescribed by your urologist, please return to the emergency department.

## 2021-03-26 LAB — URINE CULTURE: Culture: NO GROWTH

## 2021-03-29 DIAGNOSIS — Z23 Encounter for immunization: Secondary | ICD-10-CM | POA: Diagnosis not present

## 2021-04-03 ENCOUNTER — Telehealth: Payer: Self-pay

## 2021-04-03 NOTE — Telephone Encounter (Signed)
Copied from [place] [phone]. Topic: General - Other >> Apr 03, 2021  1:58 PM [name] wrote: Pt's wife wants the office to know that the patient received another booster, he has had two moderna vaccines and now this is his third booster.  03/29/2021

## 2021-04-07 ENCOUNTER — Other Ambulatory Visit: Payer: Self-pay | Admitting: Family Medicine

## 2021-04-09 ENCOUNTER — Other Ambulatory Visit: Payer: Self-pay | Admitting: Family Medicine

## 2021-04-19 DIAGNOSIS — Z23 Encounter for immunization: Secondary | ICD-10-CM | POA: Diagnosis not present

## 2021-04-27 ENCOUNTER — Telehealth: Payer: Self-pay

## 2021-04-27 NOTE — Telephone Encounter (Signed)
Copied from [place] [id number]. Topic: General - Other >> Apr 27, 2021 11:53 AM Yvette Rack wrote: Reason for CRM: Pt wife stated pt rollator broke and they would like to know if Dr. B could advise where they can get him another one. Cb# [phone]

## 2021-05-01 NOTE — Telephone Encounter (Signed)
Patient advised of Rollators being in stock at [facility] or other Medical supply stores.

## 2021-05-03 ENCOUNTER — Other Ambulatory Visit: Payer: Self-pay

## 2021-05-03 ENCOUNTER — Ambulatory Visit (INDEPENDENT_AMBULATORY_CARE_PROVIDER_SITE_OTHER): Payer: Medicare Other | Admitting: Podiatry

## 2021-05-03 ENCOUNTER — Encounter: Payer: Self-pay | Admitting: Podiatry

## 2021-05-03 DIAGNOSIS — M79676 Pain in unspecified toe(s): Secondary | ICD-10-CM | POA: Diagnosis not present

## 2021-05-03 DIAGNOSIS — B351 Tinea unguium: Secondary | ICD-10-CM

## 2021-05-03 DIAGNOSIS — L84 Corns and callosities: Secondary | ICD-10-CM

## 2021-05-03 NOTE — Progress Notes (Signed)
This patient returns to my office for at risk foot care.  This patient requires this care by a professional since this patient will be at risk due to having peripheral neuropathy.    This patient has developed an ulcerous callus/hematoma left forefoot.  He has been treating this skin lesion at home and there is no drainage.  He presents to the office wearing bandages. This patient presents for at risk foot care today.  General Appearance  Alert, conversant and in no acute stress.  Vascular  Dorsalis pedis and posterior tibial  pulses are weakly  palpable  bilaterally.  Capillary return is within normal limits  bilaterally. Temperature is within normal limits  bilaterally.  Neurologic  Senn-Weinstein monofilament wire test within normal limits  bilaterally. Muscle power within normal limits bilaterally.  Nails Thick disfigured discolored nails with subungual debris  from hallux to fifth toes bilaterally. No evidence of bacterial infection or drainage bilaterally.  Orthopedic  No limitations of motion  feet .  No crepitus or effusions noted.  No bony pathology or digital deformities noted.  HAV 1st MPJ left foot.  Hammer toe second toe left foot. Plantar flexed second metatarsal left foot.  Skin  normotropic skin with no porokeratosis noted bilaterally.  No signs of infections or ulcers noted.     Ulcerous callus left forefoot.  Consent was obtained for treatment procedures.    Filed with dremel without incident at the site of the ulcer..    Return office visit    10 weeks                 Told patient to return for periodic foot care and evaluation due to potential at risk complications.   Gardiner Barefoot DPM

## 2021-05-10 ENCOUNTER — Emergency Department: Payer: Medicare Other

## 2021-05-10 ENCOUNTER — Emergency Department
Admission: EM | Admit: 2021-05-10 | Discharge: 2021-05-11 | Disposition: A | Discharge status: Home / Self Care | Payer: Medicare Other | Attending: Emergency Medicine | Admitting: Emergency Medicine

## 2021-05-10 ENCOUNTER — Other Ambulatory Visit: Payer: Self-pay

## 2021-05-10 ENCOUNTER — Encounter: Payer: Self-pay | Admitting: Emergency Medicine

## 2021-05-10 DIAGNOSIS — S32020A Wedge compression fracture of second lumbar vertebra, initial encounter for closed fracture: Secondary | ICD-10-CM

## 2021-05-10 DIAGNOSIS — Y92009 Unspecified place in unspecified non-institutional (private) residence as the place of occurrence of the external cause: Secondary | ICD-10-CM | POA: Insufficient documentation

## 2021-05-10 DIAGNOSIS — K5900 Constipation, unspecified: Secondary | ICD-10-CM | POA: Insufficient documentation

## 2021-05-10 DIAGNOSIS — Z79899 Other long term (current) drug therapy: Secondary | ICD-10-CM | POA: Insufficient documentation

## 2021-05-10 DIAGNOSIS — W010XXA Fall on same level from slipping, tripping and stumbling without subsequent striking against object, initial encounter: Secondary | ICD-10-CM | POA: Insufficient documentation

## 2021-05-10 DIAGNOSIS — Z8546 Personal history of malignant neoplasm of prostate: Secondary | ICD-10-CM | POA: Insufficient documentation

## 2021-05-10 DIAGNOSIS — W19XXXA Unspecified fall, initial encounter: Secondary | ICD-10-CM

## 2021-05-10 DIAGNOSIS — Z043 Encounter for examination and observation following other accident: Secondary | ICD-10-CM | POA: Diagnosis not present

## 2021-05-10 DIAGNOSIS — S3992XA Unspecified injury of lower back, initial encounter: Secondary | ICD-10-CM | POA: Diagnosis present

## 2021-05-10 DIAGNOSIS — Z87891 Personal history of nicotine dependence: Secondary | ICD-10-CM | POA: Insufficient documentation

## 2021-05-10 DIAGNOSIS — I10 Essential (primary) hypertension: Secondary | ICD-10-CM | POA: Diagnosis not present

## 2021-05-10 DIAGNOSIS — M25511 Pain in right shoulder: Secondary | ICD-10-CM | POA: Insufficient documentation

## 2021-05-10 DIAGNOSIS — S32022A Unstable burst fracture of second lumbar vertebra, initial encounter for closed fracture: Secondary | ICD-10-CM | POA: Diagnosis not present

## 2021-05-10 DIAGNOSIS — M47816 Spondylosis without myelopathy or radiculopathy, lumbar region: Secondary | ICD-10-CM | POA: Diagnosis not present

## 2021-05-10 DIAGNOSIS — M545 Low back pain, unspecified: Secondary | ICD-10-CM | POA: Diagnosis not present

## 2021-05-10 MED ORDER — NAPROXEN 500 MG PO TABS
500.0000 mg | ORAL_TABLET | Freq: Once | ORAL | Status: AC
  Administered 2021-05-10: 500 mg via ORAL
  Filled 2021-05-10: qty 1

## 2021-05-10 MED ORDER — GLYCERIN (LAXATIVE) 2 G RE SUPP
1.0000 | Freq: Once | RECTAL | Status: AC
  Administered 2021-05-10: 1 via RECTAL
  Filled 2021-05-10 (×2): qty 1

## 2021-05-10 MED ORDER — TRAMADOL HCL 50 MG PO TABS: 50.0000 mg | ORAL_TABLET | Freq: Three times a day (TID) | ORAL | 0 refills | Status: AC | PRN

## 2021-05-10 MED ORDER — TRAMADOL HCL 50 MG PO TABS
50.0000 mg | ORAL_TABLET | Freq: Once | ORAL | Status: AC
Start: 2021-05-10 — End: 2021-05-10
  Administered 2021-05-10: 50 mg via ORAL
  Filled 2021-05-10: qty 1

## 2021-05-10 MED ORDER — POLYETHYLENE GLYCOL 3350 17 G PO PACK
17.0000 g | PACK | Freq: Every day | ORAL | Status: DC
  Administered 2021-05-10: 17 g via ORAL
  Filled 2021-05-10: qty 1

## 2021-05-10 MED ORDER — DOCUSATE SODIUM 100 MG PO CAPS
100.0000 mg | ORAL_CAPSULE | Freq: Once | ORAL | Status: AC
  Administered 2021-05-10: 100 mg via ORAL
  Filled 2021-05-10: qty 1

## 2021-05-10 NOTE — Discharge Instructions (Addendum)
Take the pain medicine as needed.  Take MiraLAX 3 times daily with water/juice/coffee until your stools are regular.  Follow-up with your primary provider regarding your stable lumbar compression fracture.  Return to the ED if needed.

## 2021-05-10 NOTE — ED Provider Notes (Signed)
[facility] Emergency Department Provider Note ____________________________________________  Time seen: 2025  I have reviewed the triage vital signs and the nursing notes.  HISTORY  Chief Complaint  Fall and Constipation   HPI [name] is a 87 y.o. male presents to the ED accompanied by his wife, for evaluation of ongoing midline back pain after mechanical fall.  Patient apparently tripped and fell on Sunday at home, where his wife found him sitting on his buttocks in the bathroom.  He denies any head injury or LOC, but reported pain to the midline and left lower back as well as to the right shoulder.  According to the wife the patient is at his baseline at this time.  He does endorse an unrelated complaint of some constipation, noting he had his last bowel movement nearly a week earlier. He denies any rectal pain, rectal bleeding, or abdominal pain.  Past Medical History:  Diagnosis Date   Anemia    Aortic valve sclerosis    Arthritis    Bladder outlet obstruction    Erectile dysfunction    GERD (gastroesophageal reflux disease)    h/o   Hard of hearing    HBP (high blood pressure)    Heart murmur    History of kidney stones    History of nephrolithiasis    HLD (hyperlipidemia)    Incomplete bladder emptying    Mitral regurgitation    Neuropathy    Nocturia    Osteoporosis    Pre-diabetes    Prostate cancer ([place])    Shingles 07/08/2012   Sinus problem    Stomach ulcer    Stroke ([place]) 07/08/2010   Urinary frequency    Urinary leakage     Patient Active Problem List   Diagnosis Date Noted   Gross hematuria 03/13/2021   Clavi 01/18/2021   Pre-ulcerative calluses 01/18/2021   Hypervolemia 10/06/2020   Foot callus 10/06/2020   Peripheral neuropathy 08/22/2020   Mitral regurgitation and aortic stenosis 06/27/2020   Encounter for annual physical exam 06/15/2020   Murmur, cardiac 06/15/2020   Recurrent major depressive disorder, in full  remission ([place]) 06/15/2020   Calf swelling 06/15/2020   Porokeratosis 03/16/2020   Adjustment disorder with mixed anxiety and depressed mood 06/17/2019   Memory difficulties 06/17/2019   Senile purpura ([place]) 05/21/2018   Frequent falls 04/01/2017   Constipation 03/31/2017   Eczema 02/12/2017   Left leg pain 02/12/2017   Urinary frequency 09/18/2015   Allergic rhinitis 06/27/2015   H/O: CVA (cerebrovascular accident) 06/27/2015   DD (diverticular disease) 06/27/2015   Benign essential HTN 06/27/2015   Hypercholesterolemia 06/27/2015   Fungal infection of nail 06/27/2015   Prediabetes 03/15/2015   Prostate cancer ([place]) 12/18/2014   Arthritis, degenerative 01/12/2014   H/O malignant neoplasm of prostate 12/03/2012   Urge incontinence 11/09/2012   Obstruction of urinary tract 11/09/2012   Dermatophytosis of groin 09/29/2012   ED (erectile dysfunction) of organic origin 09/29/2012   Incomplete bladder emptying 09/29/2012    Past Surgical History:  Procedure Laterality Date   CATARACT EXTRACTION  2000, 2001   CHOLECYSTECTOMY  07/08/1970   CYSTOSCOPY/URETEROSCOPY/HOLMIUM LASER/STENT PLACEMENT Left 03/23/2021   Procedure: CYSTOSCOPY/URETEROSCOPY/HOLMIUM LASER/STENT PLACEMENT;  Surgeon: Billey Co, MD;  Location: [facility] ORS;  Service: Urology;  Laterality: Left;   PROSTATE SURGERY  2008/2009   cryotherapy   TONSILLECTOMY     UMBILICAL HERNIA REPAIR      Prior to Admission medications   Medication Sig Start Date End  Date Taking? Authorizing Provider  traMADol (ULTRAM) 50 MG tablet Take 1 tablet (50 mg total) by mouth 3 (three) times daily as needed for up to 5 days. 05/10/21 05/15/21 Yes Jackalynn Art, Dannielle Karvonen, PA-C  amLODipine (NORVASC) 5 MG tablet Take 1 tablet by mouth once daily 03/23/21   [name], MD  busPIRone (BUSPAR) 5 MG tablet Take 1 tablet by mouth twice daily 04/10/21   [name], MD  cephALEXin (KEFLEX) 250 MG capsule Take 1 capsule (250 mg  total) by mouth daily. 03/23/21   Billey Co, MD  fluticasone (FLONASE) 50 MCG/ACT nasal spray Place 1 spray into both nostrils daily. 11/27/20   [name], MD  furosemide (LASIX) 40 MG tablet Take 1 tablet (40 mg total) by mouth daily. Patient taking differently: Take 40 mg by mouth every morning. 03/22/21   [name], MD  gabapentin (NEURONTIN) 100 MG capsule Take 1 capsule by mouth at bedtime 04/07/21   [name], MD  Multiple Vitamin (MULTI-VITAMIN) tablet Take 1 tablet by mouth daily.     [provider]  naproxen sodium (ALEVE) 220 MG tablet Take 220 mg by mouth daily as needed (pain/headache).    [provider]  Polyethyl Glycol-Propyl Glycol (SYSTANE OP) Place 1 drop into both eyes daily as needed (dry eye).    [provider]    Allergies Toviaz [fesoterodine fumarate er]  Family History  Problem Relation Age of Onset   Cancer Mother    Stroke Father    Stroke Sister    Prostate cancer Brother    Kidney disease Neg Hx    Kidney cancer Neg Hx    Bladder Cancer Neg Hx     Social History Social History   Tobacco Use   Smoking status: Former    Years: 10.00    Types: Cigarettes    Quit date: 07/08/1962    Years since quitting: 58.8   Smokeless tobacco: Never   Tobacco comments:    quit 1964  Vaping Use   Vaping Use: Never used  Substance Use Topics   Alcohol use: Yes    Alcohol/week: 1.0 - 2.0 standard drink    Types: 1 Glasses of wine per week    Comment: every 2-3 mths, 1 glass of wine daily     Drug use: No    Review of Systems  Constitutional: Negative for fever. Eyes: Negative for visual changes. ENT: Negative for sore throat. Cardiovascular: Negative for chest pain. Respiratory: Negative for shortness of breath. Gastrointestinal: Negative for abdominal pain, vomiting and diarrhea.  Reports constipation as above Genitourinary: Negative for dysuria. Musculoskeletal: Positive for back  pain. Skin: Negative for rash. Neurological: Negative for headaches, focal weakness or numbness. ____________________________________________  PHYSICAL EXAM:  VITAL SIGNS: ED Triage Vitals  Enc Vitals Group     BP 05/10/21 1637 (!) 155/78     Pulse Rate 05/10/21 1637 69     Resp 05/10/21 1637 18     Temp 05/10/21 1637 98.4 F (36.9 C)     Temp Source 05/10/21 1637 Oral     SpO2 05/10/21 1637 96 %     Weight 05/10/21 1638 145 lb 15.1 oz (66.2 kg)     Height 05/10/21 1638 5\' 9"  (1.753 m)     Head Circumference --      Peak Flow --      Pain Score 05/10/21 1637 3     Pain Loc --  Pain Edu? --      Excl. in [place]? --     Constitutional: Alert and oriented. Well appearing and in no distress. Head: Normocephalic and atraumatic. Eyes: Conjunctivae are normal. Normal extraocular movements Neck: Supple.  Cardiovascular: Normal rate, regular rhythm. Normal distal pulses. Respiratory: Normal respiratory effort. No wheezes/rales/rhonchi. Gastrointestinal: Soft and nontender. No distention, rebound, guarding, or rigidity.  Normal active bowel sounds noted. Musculoskeletal: Normal spinal alignment with midline tenderness to palpation over the lumbar sacral junction.  Nontender with normal range of motion in all extremities.  Neurologic: Cranial nerves II to XII grossly intact.  Normal LE DTRs bilaterally.  Normal gross sensation normal speech and language. No gross focal neurologic deficits are appreciated. Skin:  Skin is warm, dry and intact. No rash noted. Psychiatric: Mood and affect are normal. Patient exhibits appropriate insight and judgment. ____________________________________________    {LABS (pertinent positives/negatives)  ____________________________________________  {EKG  ____________________________________________   RADIOLOGY Official radiology report(s): DG Lumbar Spine 2-3 Views  Result Date: 05/10/2021 CLINICAL DATA:  Status post fall EXAM: LUMBAR SPINE - 2-3  VIEW COMPARISON:  CT renal 03/25/2021, x-ray abdomen 07/09/2018 FINDINGS: Markedly limited evaluation due to overlapping osseous structures and overlying soft tissues. Five non-rib-bearing lumbar vertebral bodies. There is no evidence of lumbar spine fracture. Multilevel degenerative changes of the spine. Stable grade 1 anterolisthesis of L4 on L5. Atherosclerotic of the aorta. 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 IMPRESSION: 1. No acute displaced fracture or traumatic listhesis of the lumbar spine. Markedly limited evaluation due to overlapping osseous structures and overlying soft tissues. 2. Aortic Atherosclerosis (ICD10-I70.0). 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 Electronically Signed   By: Iven Finn M.D.   On: 05/10/2021 18:14   DG Shoulder Right  Result Date: 05/10/2021 CLINICAL DATA:  Right shoulder pain status post fall EXAM: RIGHT SHOULDER - 2+ VIEW COMPARISON:  None. FINDINGS: Acromioclavicular and glenohumeral joint degenerative changes. There is no evidence of fracture or dislocation. No aggressive appearing focal bone abnormality. Soft tissues are unremarkable. IMPRESSION: No acute displaced fracture or dislocation. Electronically Signed   By: Iven Finn M.D.   On: 05/10/2021 17:56   CT Lumbar Spine Wo Contrast  Result Date: 05/10/2021 CLINICAL DATA:  Fall injury 5 days ago with left lower back pain. EXAM: CT LUMBAR SPINE WITHOUT CONTRAST TECHNIQUE: Multidetector CT imaging of the lumbar spine was performed without intravenous contrast administration. Multiplanar CT image reconstructions were also generated. COMPARISON:  CT abdomen and pelvis and reconstructions of 03/19/2021 FINDINGS: Segmentation: 5 lumbar type vertebrae. Alignment: There is a very slight dextrocurvature, unchanged as well as 8 mm grade 1 L4-5 spondylolisthesis due to advanced facet hypertrophy at this level Vertebrae: There is osteopenia and acute, mildly depressed fracture of the superior  endplate of the L2 vertebral body. Loss of the central vertebral height is approximately 20% but there is less than 10% anterior and posterior height loss and only trace posterosuperior cortical retropulsion which is nonstenosing. There is mild chronic upper plate anterior wedge compression fracture of the T12 vertebral body which is unchanged. All other lumbar vertebrae are intact and all pedicles and posterior elements are intact. Paraspinal and other soft tissues: There is a slight soft tissue reaction alongside the L2 vertebral body but there is no space-occupying paraspinal hematoma. There is extensive aortoiliac calcific plaque, as before, without AAA. Stable ectasia of the common iliac arteries, 1.7 cm each. Disc levels: T11-12: The disc is degenerated. There is no herniation or stenosis. T12-L1: There  is a normal disc height. There is no herniation or stenosis. L1-2: There is normal disc height. There is a mild diffuse disc bulge which does not appear to have increased. This is nonstenosing without herniation. There mild facet spurs without foraminal compromise. L2-3: There is normal disc height. Diffuse annular bulging, ligamentous and facet hypertrophy combined cause mild-to-moderate spinal canal stenosis with lateral recess effacement. There is only minimal foraminal stenosis. L3-4: There is a normal disc height. Again noted, due to circumferential disc bulge, ligamentous and moderate facet hypertrophy, there is effacement of lateral recesses with moderate spinal canal stenosis, as before. There is inferior foraminal disc bulging with mild to moderate right and mild left foraminal stenosis. L4-5: There is a slight disc space loss, unchanged. There is advanced facet and ligamentous hypertrophy, broad posterior disc bulge, and markedly severe spinal canal stenosis, with inferior foraminal disc bulging and bilateral moderate foraminal stenosis. L5-S1: This disc is normal in height. There is mild disc bulge,  advanced facet hypertrophy with ligamentous thickening and moderate spinal canal stenosis. There is mild right and moderate left foraminal stenosis. Other: The SI joints are patent with spurring and small chronic sclerosis along the anterior right SI joint. IMPRESSION: 1. Acute superior endplate compression fracture of the L2 vertebral body, with 20% central and less than 10% anterior and posterior height loss with only trace retropulsion. There is mild adjacent soft tissue reaction but no adjacent hematoma, no involvement of the pedicles and posterior elements. 2. Osteopenia and degenerative changes as seen on 03/19/2021 moderate L3-4 and severe L4-5 acquired spinal canal stenosis and chronic grade 1 degenerative L4-5 spondylolisthesis. 3. Extensive aortoiliac calcific plaques and additional detailed findings described above. Electronically Signed   By: Telford Nab M.D.   On: 05/10/2021 21:39   ____________________________________________  PROCEDURES  Ultram 50 mg PO PEG 17 g PO Glycerin suppository 1 PO Naproxen 500 mg PO Colace 100 mg PO  Procedures ____________________________________________   INITIAL IMPRESSION / ASSESSMENT AND PLAN / ED COURSE  As part of my medical decision making, I reviewed the following data within the [place] History obtained from family, Radiograph reviewed as noted, and Notes from prior ED visits    DDX: lumbar sprain, compression fracture, radiculopathy, constipation, impaction  Geriatric patient ED evaluation of ongoing back pain following mechanical fall on Sunday.  Patient's initial plain comes in not reveal any acute fracture or dislocation.  It did however show what appeared to be moderate stool burden and a moderate stool mass in the rectum.  He was further evaluated with CT imaging given his acute midline pain complaints, and this did confirm an acute L2 compression fracture.  This did correlate with the patient's clinical exam.  No  retropulsion or spinal cord impingement appreciated.  Patient without any focal radicular symptoms on exam.  He was treated with Colace, glycerin suppository, MiraLAX in the ED for his constipation and had successful manual fecal disimpaction.  He was also given tramadol and naproxen for his back pain.  He will be discharged with a prescription for tramadol and will continue to monitor symptoms.  Follow with primary provider for ongoing symptoms or return to the ED as discussed.  Sincere Jerilynn Mages Picazo was evaluated in Emergency Department on 05/11/2021 for the symptoms described in the history of present illness. He was evaluated in the context of the global COVID-19 pandemic, which necessitated consideration that the patient might be at risk for infection with the SARS-CoV-2 virus that causes COVID-19.  Institutional protocols and algorithms that pertain to the evaluation of patients at risk for COVID-19 are in a state of rapid change based on information released by regulatory bodies including the CDC and federal and state organizations. These policies and algorithms were followed during the patient's care in the ED. ____________________________________________  FINAL CLINICAL IMPRESSION(S) / ED DIAGNOSES  Final diagnoses:  Constipation, unspecified constipation type  Compression fracture of L2 vertebra, initial encounter [facility])  Fall in home, initial encounter      Melvenia Needles, PA-C 05/11/21 0103    Nena Polio, MD 05/11/21 [phone]

## 2021-05-10 NOTE — ED Triage Notes (Addendum)
Pt comes into the ED via POV c/o fall on Sunday.  Pt is now having pain at the left lower back and right shoulder.  Pt states he did hit his head, denies any LOC, and denies any blood thinner use.  Pt is at baseline per his wife.  PT in NAD at this time with even and unlabored respirations.  Pt also c/o constipation.  Pt states that last BM was last Friday.

## 2021-05-13 DIAGNOSIS — Z20828 Contact with and (suspected) exposure to other viral communicable diseases: Secondary | ICD-10-CM | POA: Diagnosis not present

## 2021-05-14 ENCOUNTER — Telehealth: Payer: Self-pay

## 2021-05-14 NOTE — Telephone Encounter (Signed)
lmtcb

## 2021-05-14 NOTE — Telephone Encounter (Signed)
Can slip into the 220 slot on 11/14. If that doesn't work, may need to see if anyone else has availability

## 2021-05-14 NOTE — Telephone Encounter (Signed)
Copied from [place] [id number]. Topic: Appointment Scheduling - Scheduling Inquiry for Clinic >> May 14, 2021  8:34 AM Tessa Lerner A wrote: Reason for CRM: The patient's wife woud like the patient to be seen for a hospital follow up   The patient fell on 05/06/21 and was seen at the [facility] ED on 05/10/21 and diagnosed with a compression fracture of the second lumbar vertebrae   The patient was prescribed pain medication but refuses to take opioids  The agent was unable to successfully schedule the visit at the time of call   Please contact further

## 2021-05-21 ENCOUNTER — Other Ambulatory Visit: Payer: Self-pay

## 2021-05-21 ENCOUNTER — Encounter: Payer: Self-pay | Admitting: Family Medicine

## 2021-05-21 ENCOUNTER — Telehealth: Payer: Self-pay

## 2021-05-21 ENCOUNTER — Ambulatory Visit (INDEPENDENT_AMBULATORY_CARE_PROVIDER_SITE_OTHER): Payer: Medicare Other | Admitting: Family Medicine

## 2021-05-21 VITALS — BP 142/82 | HR 75 | Temp 97.6°F | Resp 18 | Wt 150.7 lb

## 2021-05-21 DIAGNOSIS — G6289 Other specified polyneuropathies: Secondary | ICD-10-CM | POA: Diagnosis not present

## 2021-05-21 DIAGNOSIS — S32020D Wedge compression fracture of second lumbar vertebra, subsequent encounter for fracture with routine healing: Secondary | ICD-10-CM | POA: Diagnosis not present

## 2021-05-21 DIAGNOSIS — K5900 Constipation, unspecified: Secondary | ICD-10-CM | POA: Diagnosis not present

## 2021-05-21 DIAGNOSIS — M8000XA Age-related osteoporosis with current pathological fracture, unspecified site, initial encounter for fracture: Secondary | ICD-10-CM

## 2021-05-21 NOTE — Telephone Encounter (Signed)
Copied from [place] [id number]. Topic: Appointment Scheduling - Scheduling Inquiry for Clinic >> May 21, 2021  1:34 PM Erick Blinks wrote: Reason for CRM: Pt's daughter Brett Fairy wants to be added to the appt today with PCP Best contact: [phone]

## 2021-05-21 NOTE — Assessment & Plan Note (Addendum)
New onset Continue naproxen 220mg  as needed for pain Continue to monitor

## 2021-05-21 NOTE — Progress Notes (Signed)
Established patient visit   Patient: [name]   DOB: [birth year]   87 y.o. Male  MRN: [id number] Visit Date: 05/21/2021  Today's healthcare provider: Lavon Paganini, MD   Chief Complaint  Patient presents with   Follow-up    ER/Hospital follow up.   Subjective     HPI     Follow-up    Additional comments: ER/Hospital follow up.      Last edited by Doristine Devoid, CMA on 05/21/2021  2:20 PM.      Pain is 4-6/10. Concerned about pain medication use after ER visit. Pain is currently limiting mobility, but improving Numbness in calves Dyspnea at bedtime. Productive cough with grey-ish mucus Anxiety medications not effective Issues with hearing    Wife present and daughter Brett Fairy on phone   Medications: Outpatient Medications Prior to Visit  Medication Sig   amLODipine (NORVASC) 5 MG tablet Take 1 tablet by mouth once daily   busPIRone (BUSPAR) 5 MG tablet Take 1 tablet by mouth twice daily   fluticasone (FLONASE) 50 MCG/ACT nasal spray Place 1 spray into both nostrils daily.   furosemide (LASIX) 40 MG tablet Take 1 tablet (40 mg total) by mouth daily. (Patient taking differently: Take 40 mg by mouth every morning.)   gabapentin (NEURONTIN) 100 MG capsule Take 1 capsule by mouth at bedtime   Multiple Vitamin (MULTI-VITAMIN) tablet Take 1 tablet by mouth daily.    naproxen sodium (ALEVE) 220 MG tablet Take 220 mg by mouth daily as needed (pain/headache).   Polyethyl Glycol-Propyl Glycol (SYSTANE OP) Place 1 drop into both eyes daily as needed (dry eye).   [DISCONTINUED] cephALEXin (KEFLEX) 250 MG capsule Take 1 capsule (250 mg total) by mouth daily. (Patient not taking: Reported on 05/21/2021)   No facility-administered medications prior to visit.    Review of Systems  Constitutional: Negative.   HENT:  Positive for postnasal drip.   Eyes: Negative.   Respiratory:  Positive for cough and shortness of breath.   Cardiovascular: Negative.    Gastrointestinal: Negative.   Genitourinary: Negative.   Musculoskeletal:  Positive for back pain.  Skin: Negative.   Allergic/Immunologic: Positive for environmental allergies.  Neurological:  Positive for numbness.  Psychiatric/Behavioral:  The patient is nervous/anxious.       Objective    BP (!) 142/82 (BP Location: Right Arm, Patient Position: Bed low/side rails up, Cuff Size: Normal)   Pulse 75   Temp 97.6 F (36.4 C) (Oral)   Resp 18   Wt 150 lb 11.2 oz (68.4 kg)   SpO2 99%   BMI 22.25 kg/m  {Show previous vital signs (optional):23777}  Physical Exam Vitals reviewed.  Constitutional:      General: He is not in acute distress.    Appearance: Normal appearance. He is not ill-appearing or toxic-appearing.  HENT:     Head: Normocephalic and atraumatic.     Right Ear: External ear normal.     Left Ear: External ear normal.     Nose: Nose normal.     Mouth/Throat:     Mouth: Mucous membranes are moist.     Pharynx: Oropharynx is clear. No posterior oropharyngeal erythema.  Eyes:     General: No scleral icterus.    Extraocular Movements: Extraocular movements intact.     Conjunctiva/sclera: Conjunctivae normal.     Pupils: Pupils are equal, round, and reactive to light.  Cardiovascular:     Rate and Rhythm: Normal rate and regular  rhythm.     Pulses: Normal pulses.     Heart sounds: Murmur heard.    No friction rub.  Pulmonary:     Effort: Pulmonary effort is normal. No respiratory distress.     Breath sounds: Rhonchi present.  Chest:     Chest wall: No tenderness.  Abdominal:     General: Abdomen is flat. Bowel sounds are normal. There is no distension.     Palpations: Abdomen is soft.     Tenderness: There is no abdominal tenderness.  Musculoskeletal:     Cervical back: Normal range of motion and neck supple.  Skin:    General: Skin is warm and dry.     Findings: No lesion or rash.  Neurological:     General: No focal deficit present.     Mental  Status: He is alert and oriented to person, place, and time.     Sensory: Sensory deficit present.  Psychiatric:        Mood and Affect: Mood normal.        Behavior: Behavior normal.      No results found for any visits on 05/21/21.  Assessment & Plan     Problem List Items Addressed This Visit       Nervous and Auditory   Peripheral neuropathy    Chronic, worsening Continue gabapentin 100mg  qhs        Musculoskeletal and Integument   Age-related osteoporosis with current pathological fracture    New onset Continue naproxen 220mg  as needed for pain Continue to monitor         Other   Constipation    Chronic, stable Increase dietary fiber Continue miralax as needed      Other Visit Diagnoses     Closed compression fracture of L2 lumbar vertebra with routine healing, subsequent encounter    -  Primary      - pain control improving - not a candidate for surgical intervention/it is not in his goals of care - will get back to walking more  Return in about 4 weeks (around 06/18/2021) for as scheduled, chronic disease f/u.      Nelva Nay, Medical Student 05/21/2021, 5:20 PM   Total time spent on today's visit was greater than 40 minutes, including both face-to-face time and nonface-to-face time personally spent on review of chart (labs and imaging), discussing labs and goals, discussing further work-up, treatment options, referrals to specialist if needed, reviewing outside records of pertinent, answering patient's questions, and coordinating care.    Patient seen along with MS3 student Nelva Nay. I personally evaluated this patient along with the student, and verified all aspects of the history, physical exam, and medical decision making as documented by the student. I agree with the student's documentation and have made all necessary edits.  Lesley Atkin, Dionne Bucy, MD, MPH [facility]

## 2021-05-21 NOTE — Assessment & Plan Note (Signed)
Chronic, worsening Continue gabapentin 100mg  qhs

## 2021-05-21 NOTE — Patient Instructions (Addendum)
Vitamin D3 1000-2000 units daily

## 2021-05-21 NOTE — Assessment & Plan Note (Signed)
Chronic, stable Increase dietary fiber Continue miralax as needed

## 2021-05-21 NOTE — Telephone Encounter (Signed)
Noted  

## 2021-06-20 NOTE — Progress Notes (Signed)
Established patient visit   Patient: [name]   DOB: [birth year]   87 y.o. Male  MRN: [id number] Visit Date: 06/21/2021  Today's healthcare provider: Lavon Paganini, MD   Chief Complaint  Patient presents with   Follow-up    Chronic Disease    Subjective    HPI HPI     Follow-up    Additional comments: Chronic Disease      Last edited by Doristine Devoid, CMA on 06/21/2021  2:05 PM.      Patient here for follow up on chronic disease. Essential Hypertension: stable  Mitral regurgitation and aortic stenosis : Continue Lasix 40 mg Unable to lay flat at night due to SOB Has gained 10 lbs in 6 weeks, but he wonders if this is due to drinking a quart of eggnog daily  Peeing less after his Lasix than he was previously. Likely eating more sodium per his family.  Near fall last night. Leaning forward and rollator rolled away. Caught himself and didn't fall. Sat back onto rollator. Feels like he pulled his gluteus maximus on L side. Pain over ischium on that side.   Medications: Outpatient Medications Prior to Visit  Medication Sig   amLODipine (NORVASC) 5 MG tablet Take 1 tablet by mouth once daily   busPIRone (BUSPAR) 5 MG tablet Take 1 tablet by mouth twice daily   fluticasone (FLONASE) 50 MCG/ACT nasal spray Place 1 spray into both nostrils daily.   Multiple Vitamin (MULTI-VITAMIN) tablet Take 1 tablet by mouth daily.    naproxen sodium (ALEVE) 220 MG tablet Take 220 mg by mouth daily as needed (pain/headache).   Polyethyl Glycol-Propyl Glycol (SYSTANE OP) Place 1 drop into both eyes daily as needed (dry eye).   [DISCONTINUED] furosemide (LASIX) 40 MG tablet Take 1 tablet (40 mg total) by mouth daily. (Patient taking differently: Take 40 mg by mouth every morning.)   gabapentin (NEURONTIN) 100 MG capsule Take 1 capsule by mouth at bedtime   No facility-administered medications prior to visit.    Review of Systems per HPI     Objective    BP (!)  160/71 (BP Location: Left Arm, Patient Position: Sitting, Cuff Size: Large) Comment: 150/62 manual   Pulse 80    Temp 97.6 F (36.4 C) (Oral)    Resp 16    Wt 155 lb 6.4 oz (70.5 kg)    BMI 22.95 kg/m  {Show previous vital signs (optional):23777}  Physical Exam Vitals reviewed.  Constitutional:      General: He is not in acute distress.    Appearance: Normal appearance. He is not diaphoretic.  HENT:     Head: Normocephalic and atraumatic.  Eyes:     General: No scleral icterus.    Conjunctiva/sclera: Conjunctivae normal.  Cardiovascular:     Rate and Rhythm: Normal rate and regular rhythm.     Pulses: Normal pulses.     Heart sounds: Normal heart sounds. No murmur heard. Pulmonary:     Effort: Pulmonary effort is normal. No respiratory distress.     Breath sounds: Rales (b/l bases) present. No wheezing.  Abdominal:     General: There is distension.     Palpations: Abdomen is soft.     Tenderness: There is no abdominal tenderness.  Musculoskeletal:     Cervical back: Neck supple.     Right lower leg: Edema present.     Left lower leg: Edema present.  Lymphadenopathy:     Cervical: No  cervical adenopathy.  Skin:    General: Skin is warm and dry.     Capillary Refill: Capillary refill takes less than 2 seconds.     Findings: No rash.  Neurological:     Mental Status: He is alert and oriented to person, place, and time.     Cranial Nerves: No cranial nerve deficit.  Psychiatric:        Mood and Affect: Mood normal.        Behavior: Behavior normal.    TTP over L ischium   No results found for any visits on 06/21/21.  Assessment & Plan     Problem List Items Addressed This Visit       Cardiovascular and Mediastinum   Benign essential HTN - Primary    Uncontrolled and elevated today Suspect that this is related to his hypervolemia as noted on exam-see plan below Will increase his Lasix as below Recheck metabolic panel Follow-up in 1 month Monitor home blood  pressures      Relevant Medications   furosemide (LASIX) 20 MG tablet   Other Relevant Orders   Comprehensive metabolic panel   Mitral regurgitation and aortic stenosis    Chronic and fairly stable, but hypervolemic on exam today with bibasilar crackles, lower extremity edema, abdominal distention, weight gain, hypertension Increase Lasix from 40 to 60 mg daily Decrease sodium in his diet Repeat echo Follow-up in 1 month or sooner as needed      Relevant Medications   furosemide (LASIX) 20 MG tablet   Other Relevant Orders   ECHOCARDIOGRAM COMPLETE     Other   Prediabetes    Recommend low carb diet Recheck A1c      Relevant Orders   Hemoglobin A1c   Hypercholesterolemia    Has not been on a statin for several years Repeat lipids and CMP      Relevant Medications   furosemide (LASIX) 20 MG tablet   Other Relevant Orders   Comprehensive metabolic panel   Lipid panel   Ischial pain, left    New problem related to a recent near fall Given that he is tender over a bony prominence, will obtain x-ray He does have normal gait today, so I do suspect this is muscular or bruising rather than a fracture, but he does have history of fractures related to falls, so I do think it is better to rule this out      Relevant Orders   DG Pelvis 1-2 Views     Return in about 4 weeks (around 07/19/2021) for chronic disease f/u.      Total time spent on today's visit was greater than 40 minutes, including both face-to-face time with patient and his wife and daughter discussing last imaging and labs, treatment options, medication changes, fall precautions, and answering patient and family questions and nonface-to-face time personally spent on review of chart (labs and imaging).  I, Lavon Paganini, MD, have reviewed all documentation for this visit. The documentation on 06/21/21 for the exam, diagnosis, procedures, and orders are all accurate and complete.   Doniven Vanpatten, Dionne Bucy, MD,  [facility]

## 2021-06-21 ENCOUNTER — Encounter: Payer: Self-pay | Admitting: Family Medicine

## 2021-06-21 ENCOUNTER — Other Ambulatory Visit: Payer: Self-pay

## 2021-06-21 ENCOUNTER — Ambulatory Visit
Admission: RE | Admit: 2021-06-21 | Discharge: 2021-06-21 | Disposition: A | Discharge status: Home / Self Care | Payer: Medicare Other | Source: Ambulatory Visit | Attending: Family Medicine | Admitting: Family Medicine

## 2021-06-21 ENCOUNTER — Ambulatory Visit (INDEPENDENT_AMBULATORY_CARE_PROVIDER_SITE_OTHER): Payer: Medicare Other | Admitting: Family Medicine

## 2021-06-21 ENCOUNTER — Ambulatory Visit
Admission: RE | Admit: 2021-06-21 | Discharge: 2021-06-21 | Disposition: A | Discharge status: Home / Self Care | Payer: Medicare Other | Attending: Family Medicine | Admitting: Family Medicine

## 2021-06-21 VITALS — BP 160/71 | HR 80 | Temp 97.6°F | Resp 16 | Wt 155.4 lb

## 2021-06-21 DIAGNOSIS — R102 Pelvic and perineal pain: Secondary | ICD-10-CM | POA: Diagnosis not present

## 2021-06-21 DIAGNOSIS — E7800 Pure hypercholesterolemia, unspecified: Secondary | ICD-10-CM | POA: Diagnosis not present

## 2021-06-21 DIAGNOSIS — M25552 Pain in left hip: Secondary | ICD-10-CM | POA: Insufficient documentation

## 2021-06-21 DIAGNOSIS — I080 Rheumatic disorders of both mitral and aortic valves: Secondary | ICD-10-CM | POA: Diagnosis not present

## 2021-06-21 DIAGNOSIS — R7303 Prediabetes: Secondary | ICD-10-CM

## 2021-06-21 DIAGNOSIS — I10 Essential (primary) hypertension: Secondary | ICD-10-CM

## 2021-06-21 MED ORDER — FUROSEMIDE 40 MG PO TABS: 60.0000 mg | ORAL_TABLET | Freq: Every day | ORAL | 1 refills | Status: DC

## 2021-06-21 MED ORDER — FUROSEMIDE 20 MG PO TABS: 60.0000 mg | ORAL_TABLET | Freq: Every day | ORAL | 1 refills | Status: DC

## 2021-06-21 NOTE — Assessment & Plan Note (Signed)
Chronic and fairly stable, but hypervolemic on exam today with bibasilar crackles, lower extremity edema, abdominal distention, weight gain, hypertension Increase Lasix from 40 to 60 mg daily Decrease sodium in his diet Repeat echo Follow-up in 1 month or sooner as needed

## 2021-06-21 NOTE — Assessment & Plan Note (Signed)
Recommend low carb diet °Recheck A1c  °

## 2021-06-21 NOTE — Assessment & Plan Note (Signed)
Uncontrolled and elevated today Suspect that this is related to his hypervolemia as noted on exam-see plan below Will increase his Lasix as below Recheck metabolic panel Follow-up in 1 month Monitor home blood pressures

## 2021-06-21 NOTE — Assessment & Plan Note (Signed)
Has not been on a statin for several years Repeat lipids and CMP

## 2021-06-21 NOTE — Patient Instructions (Signed)
Increase Lasix to 60 mg daily.  ° ° °

## 2021-06-21 NOTE — Assessment & Plan Note (Signed)
New problem related to a recent near fall Given that he is tender over a bony prominence, will obtain x-ray He does have normal gait today, so I do suspect this is muscular or bruising rather than a fracture, but he does have history of fractures related to falls, so I do think it is better to rule this out

## 2021-06-22 DIAGNOSIS — R7303 Prediabetes: Secondary | ICD-10-CM | POA: Diagnosis not present

## 2021-06-22 DIAGNOSIS — I10 Essential (primary) hypertension: Secondary | ICD-10-CM | POA: Diagnosis not present

## 2021-06-22 DIAGNOSIS — E7800 Pure hypercholesterolemia, unspecified: Secondary | ICD-10-CM | POA: Diagnosis not present

## 2021-06-23 LAB — COMPREHENSIVE METABOLIC PANEL
ALT: 24 IU/L (ref 0–44)
AST: 37 IU/L (ref 0–40)
Albumin/Globulin Ratio: 1.8 (ref 1.2–2.2)
Albumin: 4.3 g/dL (ref 3.5–4.6)
Alkaline Phosphatase: 94 IU/L (ref 44–121)
BUN/Creatinine Ratio: 31 — ABNORMAL HIGH (ref 10–24)
BUN: 28 mg/dL (ref 10–36)
Bilirubin Total: 0.5 mg/dL (ref 0.0–1.2)
CO2: 27 mmol/L (ref 20–29)
Calcium: 10.5 mg/dL — ABNORMAL HIGH (ref 8.6–10.2)
Chloride: 103 mmol/L (ref 96–106)
Creatinine, Ser: 0.89 mg/dL (ref 0.76–1.27)
Globulin, Total: 2.4 g/dL (ref 1.5–4.5)
Glucose: 114 mg/dL — ABNORMAL HIGH (ref 70–99)
Potassium: 4.9 mmol/L (ref 3.5–5.2)
Sodium: 143 mmol/L (ref 134–144)
Total Protein: 6.7 g/dL (ref 6.0–8.5)
eGFR: 80 mL/min/{1.73_m2} (ref >59)

## 2021-06-23 LAB — LIPID PANEL
Chol/HDL Ratio: 2.4 ratio (ref 0.0–5.0)
Cholesterol, Total: 181 mg/dL (ref 100–199)
HDL: 75 mg/dL (ref >39)
LDL Chol Calc (NIH): 91 mg/dL (ref 0–99)
Triglycerides: 83 mg/dL (ref 0–149)
VLDL Cholesterol Cal: 15 mg/dL (ref 5–40)

## 2021-06-23 LAB — HEMOGLOBIN A1C
Est. average glucose Bld gHb Est-mCnc: 117 mg/dL
Hgb A1c MFr Bld: 5.7 % — ABNORMAL HIGH (ref 4.8–5.6)

## 2021-06-25 ENCOUNTER — Other Ambulatory Visit: Payer: Self-pay

## 2021-06-25 ENCOUNTER — Ambulatory Visit (INDEPENDENT_AMBULATORY_CARE_PROVIDER_SITE_OTHER): Payer: Medicare Other

## 2021-06-25 VITALS — BP 130/62 | HR 73 | Temp 97.4°F | Ht 69.0 in | Wt 143.7 lb

## 2021-06-25 DIAGNOSIS — Z0000 Encounter for general adult medical examination without abnormal findings: Secondary | ICD-10-CM

## 2021-06-25 NOTE — Patient Instructions (Signed)
[name] , Thank you for taking time to come for your Medicare Wellness Visit. I appreciate your ongoing commitment to your health goals. Please review the following plan we discussed and let me know if I can assist you in the future.   Screening recommendations/referrals: Colonoscopy: aged out Recommended yearly ophthalmology/optometry visit for glaucoma screening and checkup Recommended yearly dental visit for hygiene and checkup  Vaccinations: Influenza vaccine: 04/06/21 Pneumococcal vaccine: 04/13/14 Tdap vaccine: 04/11/18 Shingles vaccine:  Zoster 04/20/07   Covid-19: 07/23/19, 08/23/19, 05/10/20, 10/06/20, 03/29/21  Advanced directives: yes, copies requested, given info on MPOA & Living Will  Conditions/risks identified: none  Next appointment: Follow up in one year for your annual wellness visit. 06/26/22 @ 3pm  Preventive Care 65 Years and Older, Male Preventive care refers to lifestyle choices and visits with your health care provider that can promote health and wellness. What does preventive care include? A yearly physical exam. This is also called an annual well check. Dental exams once or twice a year. Routine eye exams. Ask your health care provider how often you should have your eyes checked. Personal lifestyle choices, including: Daily care of your teeth and gums. Regular physical activity. Eating a healthy diet. Avoiding tobacco and drug use. Limiting alcohol use. Practicing safe sex. Taking low doses of aspirin every day. Taking vitamin and mineral supplements as recommended by your health care provider. What happens during an annual well check? The services and screenings done by your health care provider during your annual well check will depend on your age, overall health, lifestyle risk factors, and family history of disease. Counseling  Your health care provider may ask you questions about your: Alcohol use. Tobacco use. Drug use. Emotional  well-being. Home and relationship well-being. Sexual activity. Eating habits. History of falls. Memory and ability to understand (cognition). Work and work Statistician. Screening  You may have the following tests or measurements: Height, weight, and BMI. Blood pressure. Lipid and cholesterol levels. These may be checked every 5 years, or more frequently if you are over 60 years old. Skin check. Lung cancer screening. You may have this screening every year starting at age 24 if you have a 30-pack-year history of smoking and currently smoke or have quit within the past 15 years. Fecal occult blood test (FOBT) of the stool. You may have this test every year starting at age 102. Flexible sigmoidoscopy or colonoscopy. You may have a sigmoidoscopy every 5 years or a colonoscopy every 10 years starting at age 48. Prostate cancer screening. Recommendations will vary depending on your family history and other risks. Hepatitis C blood test. Hepatitis B blood test. Sexually transmitted disease (STD) testing. Diabetes screening. This is done by checking your blood sugar (glucose) after you have not eaten for a while (fasting). You may have this done every 1-3 years. Abdominal aortic aneurysm (AAA) screening. You may need this if you are a current or former smoker. Osteoporosis. You may be screened starting at age 18 if you are at high risk. Talk with your health care provider about your test results, treatment options, and if necessary, the need for more tests. Vaccines  Your health care provider may recommend certain vaccines, such as: Influenza vaccine. This is recommended every year. Tetanus, diphtheria, and acellular pertussis (Tdap, Td) vaccine. You may need a Td booster every 10 years. Zoster vaccine. You may need this after age 20. Pneumococcal 13-valent conjugate (PCV13) vaccine. One dose is recommended after age 2. Pneumococcal polysaccharide (PPSV23) vaccine. One  dose is recommended after  age 33. Talk to your health care provider about which screenings and vaccines you need and how often you need them. This information is not intended to replace advice given to you by your health care provider. Make sure you discuss any questions you have with your health care provider. Document Released: 07/21/2015 Document Revised: 03/13/2016 Document Reviewed: 04/25/2015 Elsevier Interactive Patient Education  2017 [place] Prevention in the Home Falls can cause injuries. They can happen to people of all ages. There are many things you can do to make your home safe and to help prevent falls. What can I do on the outside of my home? Regularly fix the edges of walkways and driveways and fix any cracks. Remove anything that might make you trip as you walk through a door, such as a raised step or threshold. Trim any bushes or trees on the path to your home. Use bright outdoor lighting. Clear any walking paths of anything that might make someone trip, such as rocks or tools. Regularly check to see if handrails are loose or broken. Make sure that both sides of any steps have handrails. Any raised decks and porches should have guardrails on the edges. Have any leaves, snow, or ice cleared regularly. Use sand or salt on walking paths during winter. Clean up any spills in your garage right away. This includes oil or grease spills. What can I do in the bathroom? Use night lights. Install grab bars by the toilet and in the tub and shower. Do not use towel bars as grab bars. Use non-skid mats or decals in the tub or shower. If you need to sit down in the shower, use a plastic, non-slip stool. Keep the floor dry. Clean up any water that spills on the floor as soon as it happens. Remove soap buildup in the tub or shower regularly. Attach bath mats securely with double-sided non-slip rug tape. Do not have throw rugs and other things on the floor that can make you trip. What can I do in the  bedroom? Use night lights. Make sure that you have a light by your bed that is easy to reach. Do not use any sheets or blankets that are too big for your bed. They should not hang down onto the floor. Have a firm chair that has side arms. You can use this for support while you get dressed. Do not have throw rugs and other things on the floor that can make you trip. What can I do in the kitchen? Clean up any spills right away. Avoid walking on wet floors. Keep items that you use a lot in easy-to-reach places. If you need to reach something above you, use a strong step stool that has a grab bar. Keep electrical cords out of the way. Do not use floor polish or wax that makes floors slippery. If you must use wax, use non-skid floor wax. Do not have throw rugs and other things on the floor that can make you trip. What can I do with my stairs? Do not leave any items on the stairs. Make sure that there are handrails on both sides of the stairs and use them. Fix handrails that are broken or loose. Make sure that handrails are as long as the stairways. Check any carpeting to make sure that it is firmly attached to the stairs. Fix any carpet that is loose or worn. Avoid having throw rugs at the top or bottom of the  stairs. If you do have throw rugs, attach them to the floor with carpet tape. Make sure that you have a light switch at the top of the stairs and the bottom of the stairs. If you do not have them, ask someone to add them for you. What else can I do to help prevent falls? Wear shoes that: Do not have high heels. Have rubber bottoms. Are comfortable and fit you well. Are closed at the toe. Do not wear sandals. If you use a stepladder: Make sure that it is fully opened. Do not climb a closed stepladder. Make sure that both sides of the stepladder are locked into place. Ask someone to hold it for you, if possible. Clearly mark and make sure that you can see: Any grab bars or  handrails. First and last steps. Where the edge of each step is. Use tools that help you move around (mobility aids) if they are needed. These include: Canes. Walkers. Scooters. Crutches. Turn on the lights when you go into a dark area. Replace any light bulbs as soon as they burn out. Set up your furniture so you have a clear path. Avoid moving your furniture around. If any of your floors are uneven, fix them. If there are any pets around you, be aware of where they are. Review your medicines with your doctor. Some medicines can make you feel dizzy. This can increase your chance of falling. Ask your doctor what other things that you can do to help prevent falls. This information is not intended to replace advice given to you by your health care provider. Make sure you discuss any questions you have with your health care provider. Document Released: 04/20/2009 Document Revised: 11/30/2015 Document Reviewed: 07/29/2014 Elsevier Interactive Patient Education  2017 [employer].

## 2021-06-25 NOTE — Progress Notes (Signed)
Subjective:   [name] is a 87 y.o. male who presents for Medicare Annual/Subsequent preventive examination.  Review of Systems     Cardiac Risk Factors include: advanced age (>76men, >64 women);male gender     Objective:    Today's Vitals   06/25/21 1544  BP: 130/62  Pulse: 73  Temp: (!) 97.4 F (36.3 C)  TempSrc: Oral  SpO2: 97%  Weight: 143 lb 11.2 oz (65.2 kg)  Height: 5\' 9"  (1.753 m)   Body mass index is 21.22 kg/m.  Advanced Directives 06/25/2021 05/10/2021 03/24/2021 03/23/2021 03/22/2021 10/05/2020 06/12/2020  Does Patient Have a Medical Advance Directive? Yes No Yes No Yes Yes Yes  Type of Advance Directive Living will;Healthcare Power of [name] - - Out of facility DNR (pink MOST or yellow form) [place];Living will  Does patient want to make changes to medical advance directive? Yes (Inpatient - patient defers changing a medical advance directive and declines information at this time) - - - - - -  Copy of [place] in Chart? No - copy requested - - - - - No - copy requested  Would patient like information on creating a medical advance directive? - - - Yes (Inpatient - patient defers creating a medical advance directive at this time - Information given) - - -    Current Medications (verified) Outpatient Encounter Medications as of 06/25/2021  Medication Sig   amLODipine (NORVASC) 5 MG tablet Take 1 tablet by mouth once daily   busPIRone (BUSPAR) 5 MG tablet Take 1 tablet by mouth twice daily   fluticasone (FLONASE) 50 MCG/ACT nasal spray Place 1 spray into both nostrils daily.   furosemide (LASIX) 20 MG tablet Take 3 tablets (60 mg total) by mouth daily.   gabapentin (NEURONTIN) 100 MG capsule Take 1 capsule by mouth at bedtime   Multiple Vitamin (MULTI-VITAMIN) tablet Take 1 tablet by mouth daily.    naproxen sodium (ALEVE) 220 MG tablet Take 220 mg by mouth daily as needed (pain/headache).   Polyethyl  Glycol-Propyl Glycol (SYSTANE OP) Place 1 drop into both eyes daily as needed (dry eye).   No facility-administered encounter medications on file as of 06/25/2021.    Allergies (verified) Toviaz [fesoterodine fumarate er]   History: Past Medical History:  Diagnosis Date   Anemia    Aortic valve sclerosis    Arthritis    Bladder outlet obstruction    Erectile dysfunction    GERD (gastroesophageal reflux disease)    h/o   Hard of hearing    HBP (high blood pressure)    Heart murmur    History of kidney stones    History of nephrolithiasis    HLD (hyperlipidemia)    Incomplete bladder emptying    Mitral regurgitation    Neuropathy    Nocturia    Osteoporosis    Pre-diabetes    Prostate cancer ([place])    Shingles 07/08/2012   Sinus problem    Stomach ulcer    Stroke ([place]) 07/08/2010   Urinary frequency    Urinary leakage    Past Surgical History:  Procedure Laterality Date   CATARACT EXTRACTION  2000, 2001   CHOLECYSTECTOMY  07/08/1970   CYSTOSCOPY/URETEROSCOPY/HOLMIUM LASER/STENT PLACEMENT Left 03/23/2021   Procedure: CYSTOSCOPY/URETEROSCOPY/HOLMIUM LASER/STENT PLACEMENT;  Surgeon: Billey Co, MD;  Location: [facility] ORS;  Service: Urology;  Laterality: Left;   PROSTATE SURGERY  2008/2009   cryotherapy   TONSILLECTOMY     UMBILICAL  HERNIA REPAIR     Family History  Problem Relation Age of Onset   Cancer Mother    Stroke Father    Stroke Sister    Prostate cancer Brother    Kidney disease Neg Hx    Kidney cancer Neg Hx    Bladder Cancer Neg Hx    Social History   Socioeconomic History   Marital status: Married    Spouse name: Not on file   Number of children: 2   Years of education: Not on file   Highest education level: Master's degree (e.g., MA, MS, MEng, MEd, MSW, MBA)  Occupational History   Occupation: retired  Tobacco Use   Smoking status: Former    Years: 10.00    Types: Cigarettes    Quit date: 07/08/1962    Years since quitting: 59.0    Smokeless tobacco: Never   Tobacco comments:    quit 1964  Vaping Use   Vaping Use: Never used  Substance and Sexual Activity   Alcohol use: Yes    Alcohol/week: 1.0 - 2.0 standard drink    Types: 1 Glasses of wine per week    Comment: every 2-3 mths, 1 glass of wine daily     Drug use: No   Sexual activity: Not on file  Other Topics Concern   Not on file  Social History Narrative   Not on file   Social Determinants of Health   Financial Resource Strain: Low Risk    Difficulty of Paying Living Expenses: Not hard at all  Food Insecurity: No Food Insecurity   Worried About Charity fundraiser in the Last Year: Never true   Ran Out of Food in the Last Year: Never true  Transportation Needs: No Transportation Needs   Lack of Transportation (Medical): No   Lack of Transportation (Non-Medical): No  Physical Activity: Insufficiently Active   Days of Exercise per Week: 4 days   Minutes of Exercise per Session: 30 min  Stress: Stress Concern Present   Feeling of Stress : To some extent  Social Connections: Moderately Isolated   Frequency of Communication with Friends and Family: More than three times a week   Frequency of Social Gatherings with Friends and Family: Once a week   Attends Religious Services: Never   Marine scientist or Organizations: No   Attends Music therapist: Never   Marital Status: Married    Tobacco Counseling Counseling given: Not Answered Tobacco comments: quit 1964   Clinical Intake:  Pre-visit preparation completed: Yes  Pain : No/denies pain     Nutritional Risks: None Diabetes: No  How often do you need to have someone help you when you read instructions, pamphlets, or other written materials from your doctor or pharmacy?: 1 - Never  Diabetic?no  Interpreter Needed?: No  Information entered by :: Kirke Shaggy, LPN   Activities of Daily Living In your present state of health, do you have any difficulty performing  the following activities: 06/25/2021 05/21/2021  Hearing? Tempie Donning  Vision? Y Y  Difficulty concentrating or making decisions? N Y  Walking or climbing stairs? N Y  Dressing or bathing? N Y  Doing errands, shopping? Tempie Donning  Preparing Food and eating ? N -  Using the Toilet? N -  In the past six months, have you accidently leaked urine? Y -  Do you have problems with loss of bowel control? N -  Managing your Medications? N -  Managing your  Finances? N -  Housekeeping or managing your Housekeeping? N -  Some recent data might be hidden    Patient Care Team: Bacigalupo, Dionne Bucy, MD as PCP - General (Family Medicine) Birder Robson, MD as Referring Physician (Ophthalmology) Oneta Rack, MD (Dermatology)  Indicate any recent Medical Services you may have received from other than [facility] providers in the past year (date may be approximate).     Assessment:   This is a routine wellness examination for [employer].  Hearing/Vision screen Hearing Screening - Comments:: Wears hearing aids Vision Screening - Comments:: Wears glasses- Dr.Porfilio  Dietary issues and exercise activities discussed: Current Exercise Habits: The patient does not participate in regular exercise at present   Goals Addressed             This Visit's Progress    Anxiety Symptoms Identified       Evidence-based guidance:  Assess for presence of additional co-occurring psychiatric comorbidity [e.g., substance use, other anxiety disorder (specific phobia, social anxiety disorder, panic disorder, agoraphobia, substance or medically-induced   anxiety disorder)].  Assess for presence of medical comorbidity (e.g., chronic pain, chronic illness), recent or recurrent trauma or abuse, family history of substance use disorder or mental illness.  Screen for anxiety using standardized, validated tool.  Move gradually from investigating somatic complaints to exploring social or psychologic distress.  Assess for signs and  symptoms of anxiety in an atmosphere of hope and optimism.  Facilitate full diagnostic interview when positive screening results are noted; utilize DSM-5 criteria to determine appropriate diagnosis.  Screen for depression simultaneously due to the frequency of co-occurrence.   Notes:        Depression Screen PHQ 2/9 Scores 06/25/2021 05/21/2021 03/13/2021 10/06/2020 08/22/2020 06/27/2020 06/12/2020  PHQ - 2 Score 0 1 0 0 1 0 1  PHQ- 9 Score 2 5 0 2 3 0 -  Exception Documentation - - - - - - -    Fall Risk Fall Risk  06/25/2021 03/13/2021 08/22/2020 06/12/2020 06/10/2019  Falls in the past year? 1 0 1 1 1   Number falls in past yr: 0 0 0 0 0  Comment - - - - -  Injury with Fall? 1 0 0 0 0  Comment - - - - -  Risk for fall due to : History of fall(s);Impaired mobility No Fall Risks - Impaired balance/gait;Impaired mobility -  Follow up Falls prevention discussed Falls evaluation completed - Falls prevention discussed Falls prevention discussed  Comment - - - - -    FALL RISK PREVENTION PERTAINING TO THE HOME:  Any stairs in or around the home? Yes  If so, are there any without handrails? No  Home free of loose throw rugs in walkways, pet beds, electrical cords, etc? Yes  Adequate lighting in your home to reduce risk of falls? Yes   ASSISTIVE DEVICES UTILIZED TO PREVENT FALLS:  Life alert? Yes  Use of a cane, walker or w/c? Yes  Grab bars in the bathroom? Yes  Shower chair or bench in shower? Yes  Elevated toilet seat or a handicapped toilet? No   TIMED UP AND GO:  Was the test performed? Yes .  Length of time to ambulate 10 feet: 7 sec.   Gait slow and steady with assistive device  Cognitive Function:     6CIT Screen 06/10/2019 09/13/2016  What Year? 0 points 0 points  What month? 0 points 0 points  What time? 0 points 0 points  Count back from  20 0 points 0 points  Months in reverse 0 points 0 points  Repeat phrase 6 points 0 points  Total Score 6 0     Immunizations Immunization History  Administered Date(s) Administered   Influenza Split 05/15/2009, 05/08/2011   Influenza, High Dose Seasonal PF 04/13/2014, 04/24/2018   Influenza,inj,Quad PF,6+ Mos 04/07/2013   Influenza-Unspecified 04/06/2021   Moderna Sars-Covid-2 Vaccination 07/23/2019, 08/23/2019, 05/10/2020, 10/06/2020   Pneumococcal Conjugate-13 04/13/2014   Pneumococcal Polysaccharide-23 06/09/2012   Td 01/19/2007, 04/11/2018   Tdap 12/04/2010, 08/19/2016   Zoster, Live 04/20/2007    TDAP status: Up to date  Flu Vaccine status: Up to date  Pneumococcal vaccine status: Up to date  Covid-19 vaccine status: Completed vaccines  Qualifies for Shingles Vaccine? Yes   Zostavax completed Yes   Shingrix Completed?: No.    Education has been provided regarding the importance of this vaccine. Patient has been advised to call insurance company to determine out of pocket expense if they have not yet received this vaccine. Advised may also receive vaccine at local pharmacy or Health Dept. Verbalized acceptance and understanding.  Screening Tests Health Maintenance  Topic Date Due   Zoster Vaccines- Shingrix (1 of 2) Never done   COVID-19 Vaccine (5 - Booster for Moderna series) 12/01/2020   TETANUS/TDAP  04/11/2028   Pneumonia Vaccine 92+ Years old  Completed   INFLUENZA VACCINE  Completed   HPV VACCINES  Aged Out    Health Maintenance  Health Maintenance Due  Topic Date Due   Zoster Vaccines- Shingrix (1 of 2) Never done   COVID-19 Vaccine (5 - Booster for Moderna series) 12/01/2020    Colorectal cancer screening: No longer required.   Lung Cancer Screening: (Low Dose CT Chest recommended if Age 70-80 years, 30 pack-year currently smoking OR have quit w/in 15years.) does not qualify.     Additional Screening:  Hepatitis C Screening: does not qualify; Completed none  Vision Screening: Recommended annual ophthalmology exams for early detection of glaucoma and  other disorders of the eye. Is the patient up to date with their annual eye exam?  Yes  Who is the provider or what is the name of the office in which the patient attends annual eye exams? Dr.Porfilio If pt is not established with a provider, would they like to be referred to a provider to establish care? No .   Dental Screening: Recommended annual dental exams for proper oral hygiene  Community Resource Referral / Chronic Care Management: CRR required this visit?  No   CCM required this visit?  No      Plan:     I have personally reviewed and noted the following in the patients chart:   Medical and social history Use of alcohol, tobacco or illicit drugs  Current medications and supplements including opioid prescriptions. Patient is not currently taking opioid prescriptions. Functional ability and status Nutritional status Physical activity Advanced directives List of other physicians Hospitalizations, surgeries, and ER visits in previous 12 months Vitals Screenings to include cognitive, depression, and falls Referrals and appointments  In addition, I have reviewed and discussed with patient certain preventive protocols, quality metrics, and best practice recommendations. A written personalized care plan for preventive services as well as general preventive health recommendations were provided to patient.     Dionisio David, LPN   [id number]   Nurse Notes: none

## 2021-06-28 ENCOUNTER — Other Ambulatory Visit: Payer: Self-pay | Admitting: Family Medicine

## 2021-06-28 DIAGNOSIS — I10 Essential (primary) hypertension: Secondary | ICD-10-CM

## 2021-06-28 NOTE — Telephone Encounter (Signed)
Requested Prescriptions  Pending Prescriptions Disp Refills   busPIRone (BUSPAR) 5 MG tablet [Pharmacy Med Name: busPIRone HCl 5 MG Oral Tablet] 90 tablet 0    Sig: Take 1 tablet by mouth twice daily     Psychiatry: Anxiolytics/Hypnotics - Non-controlled Passed - 06/28/2021  5:30 AM      Passed - Valid encounter within last 6 months    Recent Outpatient Visits          1 week ago Benign essential HTN   [facility], Dionne Bucy, MD   1 month ago Closed compression fracture of L2 lumbar vertebra with routine healing, subsequent encounter   [facility], Dionne Bucy, MD   3 months ago Gross hematuria   [facility], Dionne Bucy, MD   4 months ago Urinary tract infection with hematuria, site unspecified   [place], Vickki Muff, PA-C   7 months ago Mitral regurgitation and aortic stenosis   [facility] Bacigalupo, Dionne Bucy, MD      Future Appointments            In 4 weeks Bacigalupo, Dionne Bucy, MD [facility], [place]

## 2021-06-29 ENCOUNTER — Ambulatory Visit
Admission: RE | Admit: 2021-06-29 | Discharge: 2021-06-29 | Disposition: A | Discharge status: Home / Self Care | Payer: Medicare Other | Source: Ambulatory Visit | Attending: Family Medicine | Admitting: Family Medicine

## 2021-06-29 ENCOUNTER — Other Ambulatory Visit: Payer: Self-pay

## 2021-06-29 DIAGNOSIS — I4891 Unspecified atrial fibrillation: Secondary | ICD-10-CM | POA: Diagnosis not present

## 2021-06-29 DIAGNOSIS — Z8673 Personal history of transient ischemic attack (TIA), and cerebral infarction without residual deficits: Secondary | ICD-10-CM | POA: Diagnosis not present

## 2021-06-29 DIAGNOSIS — I080 Rheumatic disorders of both mitral and aortic valves: Secondary | ICD-10-CM | POA: Insufficient documentation

## 2021-06-29 LAB — ECHOCARDIOGRAM COMPLETE
AR max vel: 2.09 cm2
AV Area VTI: 2.62 cm2
AV Area mean vel: 1.88 cm2
AV Mean grad: 1 mmHg
AV Peak grad: 2.5 mmHg
Ao pk vel: 0.79 m/s
Area-P 1/2: 5.97 cm2
MV VTI: 1.35 cm2
S' Lateral: 3 cm

## 2021-06-29 NOTE — Progress Notes (Signed)
*  PRELIMINARY RESULTS* Echocardiogram 2D Echocardiogram has been performed.  [name] 06/29/2021, 12:05 PM

## 2021-07-06 ENCOUNTER — Other Ambulatory Visit: Payer: Self-pay | Admitting: Family Medicine

## 2021-07-13 ENCOUNTER — Encounter (HOSPITAL_COMMUNITY): Payer: Self-pay | Admitting: Radiology

## 2021-07-13 ENCOUNTER — Encounter: Payer: Self-pay | Admitting: Family Medicine

## 2021-07-16 ENCOUNTER — Ambulatory Visit: Payer: Self-pay | Admitting: *Deleted

## 2021-07-16 ENCOUNTER — Telehealth: Payer: Self-pay | Admitting: Family Medicine

## 2021-07-16 NOTE — Progress Notes (Signed)
Patient advised as below. [name] reports that he was told by the person who did the echo that he may need surgery to fix "the flow" in his heart. Please advise.

## 2021-07-16 NOTE — Telephone Encounter (Signed)
Patient called about EKG results. Please call back

## 2021-07-16 NOTE — Telephone Encounter (Signed)
Patient advised.

## 2021-07-16 NOTE — Telephone Encounter (Signed)
Pt called in for his EKG report.   There wasn't a report released for nurse triage to give.   Referred to office by agent.

## 2021-07-24 NOTE — Progress Notes (Signed)
Established patient visit   Patient: [name]   DOB: [birth year]   88 y.o. Male  MRN: [id number] Visit Date: 07/26/2021  Today's healthcare provider: Lavon Paganini, MD   Chief Complaint  Patient presents with   Hypertension   Subjective    HPI  Hypertension, follow-up  BP Readings from Last 3 Encounters:  07/26/21 133/70  06/25/21 130/62  06/21/21 (!) 160/71   Wt Readings from Last 3 Encounters:  07/26/21 144 lb (65.3 kg)  06/25/21 143 lb 11.2 oz (65.2 kg)  06/21/21 155 lb 6.4 oz (70.5 kg)     He was last seen for hypertension 1 months ago.  BP at that visit was 160/71. Management since that visit includes increase Lasix to 60 mg daily.  He reports good compliance with treatment. He is not having side effects.  He is following a Regular diet. He is not exercising. He does not smoke.  Use of agents associated with hypertension: none.   Outside blood pressures are . Symptoms: No chest pain No chest pressure  No palpitations No syncope  No dyspnea No orthopnea  No paroxysmal nocturnal dyspnea Yes lower extremity edema   Pertinent labs: Lab Results  Component Value Date   CHOL 181 06/22/2021   HDL 75 06/22/2021   LDLCALC 91 06/22/2021   TRIG 83 06/22/2021   CHOLHDL 2.4 06/22/2021   Lab Results  Component Value Date   NA 143 06/22/2021   K 4.9 06/22/2021   CREATININE 0.89 06/22/2021   EGFR 80 06/22/2021   GLUCOSE 114 (H) 06/22/2021   TSH 2.640 06/09/2020     The ASCVD Risk score (Arnett DK, et al., 2019) failed to calculate for the following reasons:   The 2019 ASCVD risk score is only valid for ages 85 to 47   --------------------------------------------------------------------------------------------------- Memory concerns:  Patient's family has concerns about patient having memory problems and confusion. They are requesting memory testing.   Echo 12/23: 1. Left ventricular ejection fraction, by estimation, is 50 to 55%. The   left ventricle has low normal function. The left ventricle has no regional  wall motion abnormalities. Left ventricular diastolic parameters are  indeterminate.   2. Right ventricular systolic function is normal. The right ventricular  size is normal.   3. Left atrial size was moderately dilated.   4. The mitral valve is normal in structure. Moderate mitral valve  regurgitation. No evidence of mitral stenosis. There is moderate  holosystolic prolapse of multiple scallops of the posterior leaflet of the  mitral valve.   5. The aortic valve is normal in structure. Aortic valve regurgitation is  not visualized. Aortic valve sclerosis/calcification is present, without  any evidence of aortic stenosis.   6. The inferior vena cava is dilated in size with <50% respiratory  variability, suggesting right atrial pressure of 15 mmHg.   7. Rhythm is atrial fibrillation   Medications: Outpatient Medications Prior to Visit  Medication Sig   amLODipine (NORVASC) 5 MG tablet Take 1 tablet by mouth once daily   aspirin EC 81 MG tablet Take 81 mg by mouth daily. Swallow whole.   fluticasone (FLONASE) 50 MCG/ACT nasal spray Place 1 spray into both nostrils daily.   furosemide (LASIX) 20 MG tablet Take 3 tablets (60 mg total) by mouth daily.   gabapentin (NEURONTIN) 100 MG capsule Take 1 capsule by mouth at bedtime   Multiple Vitamin (MULTI-VITAMIN) tablet Take 1 tablet by mouth daily.    naproxen sodium (ALEVE)  220 MG tablet Take 220 mg by mouth daily as needed (pain/headache).   Polyethyl Glycol-Propyl Glycol (SYSTANE OP) Place 1 drop into both eyes daily as needed (dry eye).   [DISCONTINUED] busPIRone (BUSPAR) 5 MG tablet Take 1 tablet by mouth twice daily   No facility-administered medications prior to visit.    Review of Systems  Constitutional:  Negative for appetite change, chills and fever.  Respiratory:  Negative for chest tightness, shortness of breath and wheezing.   Cardiovascular:   Positive for leg swelling. Negative for chest pain and palpitations.  Gastrointestinal:  Negative for abdominal pain, nausea and vomiting.  Neurological:  Positive for numbness (in fingers).      Objective    BP 133/70 (BP Location: Right Arm, Patient Position: Sitting, Cuff Size: Normal)    Pulse 82    Temp 98.8 F (37.1 C) (Oral)    Resp (!) 24    Wt 144 lb (65.3 kg)    SpO2 96% Comment: room air   BMI 21.27 kg/m  {Show previous vital signs (optional):23777}  Physical Exam Vitals reviewed.  Constitutional:      General: He is not in acute distress.    Appearance: Normal appearance. He is not diaphoretic.  HENT:     Head: Normocephalic and atraumatic.  Eyes:     General: No scleral icterus.    Conjunctiva/sclera: Conjunctivae normal.  Cardiovascular:     Rate and Rhythm: Normal rate and regular rhythm.     Pulses: Normal pulses.     Heart sounds: Murmur heard.  Pulmonary:     Effort: Pulmonary effort is normal. No respiratory distress.     Breath sounds: Normal breath sounds. No wheezing or rhonchi.  Musculoskeletal:     Cervical back: Neck supple.     Right lower leg: Edema present.     Left lower leg: Edema present.  Lymphadenopathy:     Cervical: No cervical adenopathy.  Skin:    General: Skin is warm and dry.     Comments: +purpura  Neurological:     Mental Status: He is alert and oriented to person, place, and time. Mental status is at baseline.  Psychiatric:        Mood and Affect: Mood normal.        Behavior: Behavior normal.      No results found for any visits on 07/26/21.  Assessment & Plan     Problem List Items Addressed This Visit       Cardiovascular and Mediastinum   Benign essential HTN - Primary    Chronic and well-controlled Reviewed last metabolic panel Continue current medications      Relevant Medications   aspirin EC 81 MG tablet   Senile purpura (HCC)    Chronic and stable      Relevant Medications   aspirin EC 81 MG tablet    Mitral regurgitation and aortic stenosis    Chronic and fairly stable Euvolemic on exam today Continue Lasix 60 mg daily Decrease sodium in his diet Reviewed recent echo which is stable We have had discussions about his goals of care and given his age and other comorbidities, he is not interested in pursuing any surgical intervention for this at this time      Relevant Medications   aspirin EC 81 MG tablet   Paroxysmal atrial fibrillation ([place])    New finding on recent echo In normal sinus rhythm today Asymptomatic Discussed risk of stroke versus risk of bleeding as he is  a high fall risk Continue baby aspirin daily No rate or rhythm control indicated at this time      Relevant Medications   aspirin EC 81 MG tablet     Respiratory   Allergic rhinitis    Continues to have trouble with postnasal drip, especially at night Will continue sleeping on an incline Continue Flonase or Nasonex Continue antihistamine        Other   Memory difficulties    Longstanding MMSE 20 out of 30 today This is likely multifactorial related to his uncontrolled anxiety, normal aging, and significant hearing loss Will try to improve his anxiety for now May consider Aricept versus Namenda at next visit      GAD (generalized anxiety disorder)    Chronic and uncontrolled Increase BuSpar to 10 mg twice daily Reassess at next visit      Relevant Medications   busPIRone (BUSPAR) 10 MG tablet   Nocturia    Chronic and stable Encouraged him to take his Lasix first thing in the morning and not wait until the afternoon as this seems to be making it worse        Return in about 3 months (around 10/24/2021) for chronic disease f/u.      Total time spent on today's visit was greater than 40 minutes, including both face-to-face time and nonface-to-face time personally spent on review of chart (labs and imaging), discussing labs and goals, discussing further work-up, treatment options,  answering  patient's (and wife's and daughter's) questions, and coordinating care.    I, Lavon Paganini, MD, have reviewed all documentation for this visit. The documentation on 07/26/21 for the exam, diagnosis, procedures, and orders are all accurate and complete.   Shandel Busic, Dionne Bucy, MD, MPH [facility]

## 2021-07-26 ENCOUNTER — Encounter: Payer: Self-pay | Admitting: Family Medicine

## 2021-07-26 ENCOUNTER — Ambulatory Visit (INDEPENDENT_AMBULATORY_CARE_PROVIDER_SITE_OTHER): Payer: Medicare Other | Admitting: Family Medicine

## 2021-07-26 ENCOUNTER — Other Ambulatory Visit: Payer: Self-pay

## 2021-07-26 VITALS — BP 133/70 | HR 82 | Temp 98.8°F | Resp 24 | Wt 144.0 lb

## 2021-07-26 DIAGNOSIS — R351 Nocturia: Secondary | ICD-10-CM

## 2021-07-26 DIAGNOSIS — D692 Other nonthrombocytopenic purpura: Secondary | ICD-10-CM

## 2021-07-26 DIAGNOSIS — R413 Other amnesia: Secondary | ICD-10-CM

## 2021-07-26 DIAGNOSIS — F411 Generalized anxiety disorder: Secondary | ICD-10-CM | POA: Insufficient documentation

## 2021-07-26 DIAGNOSIS — I080 Rheumatic disorders of both mitral and aortic valves: Secondary | ICD-10-CM

## 2021-07-26 DIAGNOSIS — J309 Allergic rhinitis, unspecified: Secondary | ICD-10-CM

## 2021-07-26 DIAGNOSIS — I480 Paroxysmal atrial fibrillation: Secondary | ICD-10-CM | POA: Diagnosis not present

## 2021-07-26 DIAGNOSIS — I10 Essential (primary) hypertension: Secondary | ICD-10-CM | POA: Diagnosis not present

## 2021-07-26 MED ORDER — BUSPIRONE HCL 10 MG PO TABS: 10.0000 mg | ORAL_TABLET | Freq: Two times a day (BID) | ORAL | 1 refills | Status: AC

## 2021-07-26 NOTE — Assessment & Plan Note (Signed)
Chronic and stable.   

## 2021-07-26 NOTE — Assessment & Plan Note (Signed)
Chronic and stable Encouraged him to take his Lasix first thing in the morning and not wait until the afternoon as this seems to be making it worse

## 2021-07-26 NOTE — Assessment & Plan Note (Signed)
Chronic and well-controlled Reviewed last metabolic panel Continue current medications

## 2021-07-26 NOTE — Assessment & Plan Note (Signed)
New finding on recent echo In normal sinus rhythm today Asymptomatic Discussed risk of stroke versus risk of bleeding as he is a high fall risk Continue baby aspirin daily No rate or rhythm control indicated at this time

## 2021-07-26 NOTE — Assessment & Plan Note (Signed)
Chronic and fairly stable Euvolemic on exam today Continue Lasix 60 mg daily Decrease sodium in his diet Reviewed recent echo which is stable We have had discussions about his goals of care and given his age and other comorbidities, he is not interested in pursuing any surgical intervention for this at this time

## 2021-07-26 NOTE — Assessment & Plan Note (Signed)
Longstanding MMSE 20 out of 30 today This is likely multifactorial related to his uncontrolled anxiety, normal aging, and significant hearing loss Will try to improve his anxiety for now May consider Aricept versus Namenda at next visit

## 2021-07-26 NOTE — Assessment & Plan Note (Signed)
Continues to have trouble with postnasal drip, especially at night Will continue sleeping on an incline Continue Flonase or Nasonex Continue antihistamine

## 2021-07-26 NOTE — Assessment & Plan Note (Signed)
Chronic and uncontrolled Increase BuSpar to 10 mg twice daily Reassess at next visit

## 2021-08-03 ENCOUNTER — Telehealth: Payer: Self-pay

## 2021-08-03 NOTE — Telephone Encounter (Signed)
Copied from [place] [id number]. Topic: General - Other >> Aug 03, 2021  1:19 PM McGill, Nelva Bush wrote: Reason for CRM: Lysle Dingwall from [facility] stated pt wife mentioned that the PCP was going to send new PT orders; however, they have not received them.  Fax- [phone]

## 2021-08-03 NOTE — Telephone Encounter (Signed)
Please advise 

## 2021-08-05 ENCOUNTER — Emergency Department
Admission: EM | Admit: 2021-08-05 | Discharge: 2021-08-05 | Disposition: A | Discharge status: Home / Self Care | Payer: Medicare Other | Attending: Emergency Medicine | Admitting: Emergency Medicine

## 2021-08-05 ENCOUNTER — Emergency Department: Payer: Medicare Other

## 2021-08-05 ENCOUNTER — Other Ambulatory Visit: Payer: Self-pay

## 2021-08-05 ENCOUNTER — Encounter: Payer: Self-pay | Admitting: Emergency Medicine

## 2021-08-05 DIAGNOSIS — S46212A Strain of muscle, fascia and tendon of other parts of biceps, left arm, initial encounter: Secondary | ICD-10-CM

## 2021-08-05 DIAGNOSIS — M66821 Spontaneous rupture of other tendons, right upper arm: Secondary | ICD-10-CM | POA: Diagnosis not present

## 2021-08-05 DIAGNOSIS — M79602 Pain in left arm: Secondary | ICD-10-CM | POA: Diagnosis present

## 2021-08-05 DIAGNOSIS — M66322 Spontaneous rupture of flexor tendons, left upper arm: Secondary | ICD-10-CM | POA: Diagnosis not present

## 2021-08-05 DIAGNOSIS — R600 Localized edema: Secondary | ICD-10-CM | POA: Diagnosis not present

## 2021-08-05 MED ORDER — NAPROXEN SODIUM 220 MG PO TABS
220.0000 mg | ORAL_TABLET | Freq: Every day | ORAL | 0 refills | Status: AC | PRN
Start: 2021-08-05 — End: ?

## 2021-08-05 NOTE — ED Notes (Signed)
Pt provided discharge instructions and prescription information. Pt was given the opportunity to ask questions and questions were answered. Discharge signature not obtained in the setting of the COVID-19 pandemic in order to reduce high touch surfaces.  ° °

## 2021-08-05 NOTE — ED Triage Notes (Signed)
Pt to ED via POV. Pt states that a few days ago he noticed swelling in his left bicep area. Pt reports pain and swelling has gotten worse over the past few days. Area is tender and bruised. Pt reports that it is painful when he lift his arm or extends it. Moderate pulse present in left arm with normal color and temperature. Pt is in NAD.

## 2021-08-05 NOTE — ED Provider Notes (Signed)
[facility] Provider Note    Event Date/Time   First MD Initiated Contact with Patient 08/05/21 1644     (approximate)   History   left arm swelling   HPI  [name] is a 88 y.o. male with no related past medical history and remaining history as listed in the EMR, presents to the emergency department for treatment and evaluation of the left upper arm pain.  Patient states pain started 4 days ago.  He has continued to attempt range of motion and exercise.  Pain increased today and he has noticed an increase in redness and tenderness in the area.  Pain radiates down into the forearm.     Physical Exam   Triage Vital Signs: ED Triage Vitals  Enc Vitals Group     BP 08/05/21 1548 (!) 165/107     Pulse Rate 08/05/21 1548 73     Resp 08/05/21 1548 20     Temp 08/05/21 1550 97.8 F (36.6 C)     Temp Source 08/05/21 1550 Oral     SpO2 08/05/21 1548 97 %     Weight 08/05/21 1550 142 lb (64.4 kg)     Height 08/05/21 1550 5\' 9"  (1.753 m)     Head Circumference --      Peak Flow --      Pain Score 08/05/21 1548 4     Pain Loc --      Pain Edu? --      Excl. in [place]? --     Most recent vital signs: Vitals:   08/05/21 1550 08/05/21 1840  BP:  (!) 159/81  Pulse:  64  Resp:  16  Temp: 97.8 F (36.6 C)   SpO2:  100%     General: Awake, no distress.  CV:  Good peripheral perfusion.  Resp:  Normal effort.  Abd:  No distention.  Other:  Popeye deformity of the left bicep area with erythema and ecchymosis   ED Results / Procedures / Treatments   Labs (all labs ordered are listed, but only abnormal results are displayed) Labs Reviewed - No data to display   EKG  Not indicated   RADIOLOGY Ultrasound of the left upper extremity as well as radiology report reviewed by me.  No DVT identified in the left upper extremity.   PROCEDURES:  Critical Care performed: No  Procedures   MEDICATIONS ORDERED IN ED: Medications - No data to  display   IMPRESSION / MDM / [place] / ED COURSE  I reviewed the triage vital signs and the nursing notes.                              Differential diagnosis includes, but is not limited to, bicep tendon injury/rupture, DVT  88 year old male presenting to the emergency department for treatment and evaluation of pain in the left upper extremity.  Exam is most consistent with a biceps tendon rupture.  Patient states that he had not had gradually increasing pain for a few days and then during some resistance exercise 4 days ago he noticed the area looked "swollen."  The area that he feels is swollen actually appears to be muscular deformity likely secondary to biceps tendon rupture.  There is no DVT in the left arm.  Plan will be to place him in a sling and have him follow-up with orthopedics.  Patient requested that I speak with his  daughter regarding today's diagnosis.  I attempted to call 2 numbers for Manuela Schwartz and one number for Mickel Baas. No answer.  Patient is left-hand-dominant and is concerned that he will not be able to function very well home.  He lives with his wife who will be able to assist him somewhat however, social work consult requested.  Patient and wife agreeable to this plan.     FINAL CLINICAL IMPRESSION(S) / ED DIAGNOSES   Final diagnoses:  Biceps tendon rupture, left, initial encounter     Rx / DC Orders   ED Discharge Orders          Ordered    naproxen sodium (ALEVE) 220 MG tablet  Daily PRN        08/05/21 1837             Note:  This document was prepared using Dragon voice recognition software and may include unintentional dictation errors.   Victorino Dike, FNP 08/05/21 5686    Blake Divine, MD 08/05/21 [phone]

## 2021-08-06 ENCOUNTER — Ambulatory Visit: Payer: Self-pay | Admitting: *Deleted

## 2021-08-06 DIAGNOSIS — S46212A Strain of muscle, fascia and tendon of other parts of biceps, left arm, initial encounter: Secondary | ICD-10-CM

## 2021-08-06 NOTE — Telephone Encounter (Signed)
Ok to place order for PT. Associate with gait instability diagnosis please

## 2021-08-06 NOTE — Telephone Encounter (Signed)
Request is asking for order to be faxed, wrote verbal order on form and placed in your box to sign. Please hand back to me when done so I can fax. Thanks. KW

## 2021-08-06 NOTE — Telephone Encounter (Signed)
Signed and faxed by Kasandra Knudsen

## 2021-08-06 NOTE — Telephone Encounter (Signed)
Summary: Patient seen in ER for bicep tear   Patient wife called in to say that patient was seen in ER on 08/05/21 due to a softball size tear in his arm and need to have some therapy. And she is needing to speak to a nurse about how is it best for him to get the help that he will need. Among other questions and concerns  Ph# [phone]     ED recommendation: Follow-Ups: Call Lovell Sheehan, MD (Orthopedic Surgery); to schedule an appointment Reason for Disposition  [1] Caller requesting NON-URGENT health information AND [2] PCP's office is the best resource  Answer Assessment - Initial Assessment Questions 1. REASON FOR CALL or QUESTION: "What is your reason for calling today?" or "How can I best help you?" or "What question do you have that I can help answer?"     Patient had ED visit this weekend- diagnosed with tendon rupture in bicep. Patient needs referral for orthopedic surgeon. Wife is calling wants to see different orthopedic than listed.  Protocols used: Information Only Call - No Triage-A-AH

## 2021-08-06 NOTE — Telephone Encounter (Signed)
Patient's wife called and given the message below by Dr. Mariann Barter. She says yes to the ortho referral, she verbalized understanding of Buspar dosage increased last OV.

## 2021-08-06 NOTE — Telephone Encounter (Signed)
Called and spoke with patients wife who states that patient was seen in ED yesterday for a rupture bicep tear Wife reports that when patient was in ED it was recommenced that patient see orthopedic surgeon Dr. Harlow Mares. Wife states that she does not want patient to see Dr. Harlow Mares because she states " I dont feel like he is skilled enough to work with the elderly." Wife would like to know who Dr. Brita Romp would recommend patient to see for orthopedic surgery? Mrs. Wiler request to know is there a benefit to patient going to inpatient rehab? She states that patient currently resides at [facility]. Wife reports that patient is very anxious " about this process", and wants to know if if you will increase Buspar and send in new dose to [facility] on [address]? Please review chart and advise. KW

## 2021-08-06 NOTE — Telephone Encounter (Signed)
I actually typically use Dr Harlow Mares office for Ortho referrals. Dr Mack Guise there is another good option if they are ok with that.  Ok to place referral if they agree. Some people love [facility] for Ortho.  I would let Ortho make the call on inpatient rehab though. We just increased his buspar at recent visit.

## 2021-08-06 NOTE — Telephone Encounter (Signed)
LMTCB okay for pEC nurse triage to advise. KW

## 2021-08-07 ENCOUNTER — Encounter: Payer: Self-pay | Admitting: *Deleted

## 2021-08-07 ENCOUNTER — Emergency Department
Admission: EM | Admit: 2021-08-07 | Discharge: 2021-08-07 | Disposition: A | Discharge status: Home / Self Care | Payer: Medicare Other | Attending: Emergency Medicine | Admitting: Emergency Medicine

## 2021-08-07 ENCOUNTER — Other Ambulatory Visit: Payer: Self-pay

## 2021-08-07 DIAGNOSIS — I10 Essential (primary) hypertension: Secondary | ICD-10-CM | POA: Diagnosis not present

## 2021-08-07 DIAGNOSIS — K644 Residual hemorrhoidal skin tags: Secondary | ICD-10-CM | POA: Insufficient documentation

## 2021-08-07 DIAGNOSIS — K648 Other hemorrhoids: Secondary | ICD-10-CM

## 2021-08-07 DIAGNOSIS — K625 Hemorrhage of anus and rectum: Secondary | ICD-10-CM

## 2021-08-07 DIAGNOSIS — R58 Hemorrhage, not elsewhere classified: Secondary | ICD-10-CM | POA: Diagnosis not present

## 2021-08-07 DIAGNOSIS — R197 Diarrhea, unspecified: Secondary | ICD-10-CM | POA: Diagnosis not present

## 2021-08-07 LAB — CBC
HCT: 42.5 % (ref 39.0–52.0)
Hemoglobin: 13.7 g/dL (ref 13.0–17.0)
MCH: 30.2 pg (ref 26.0–34.0)
MCHC: 32.2 g/dL (ref 30.0–36.0)
MCV: 93.8 fL (ref 80.0–100.0)
Platelets: 161 10*3/uL (ref 150–400)
RBC: 4.53 MIL/uL (ref 4.22–5.81)
RDW: 13.8 % (ref 11.5–15.5)
WBC: 4.3 10*3/uL (ref 4.0–10.5)
nRBC: 0 % (ref 0.0–0.2)

## 2021-08-07 LAB — COMPREHENSIVE METABOLIC PANEL
ALT: 20 U/L (ref 0–44)
AST: 35 U/L (ref 15–41)
Albumin: 3.6 g/dL (ref 3.5–5.0)
Alkaline Phosphatase: 63 U/L (ref 38–126)
Anion gap: 10 (ref 5–15)
BUN: 38 mg/dL — ABNORMAL HIGH (ref 8–23)
CO2: 29 mmol/L (ref 22–32)
Calcium: 10 mg/dL (ref 8.9–10.3)
Chloride: 102 mmol/L (ref 98–111)
Creatinine, Ser: 1.25 mg/dL — ABNORMAL HIGH (ref 0.61–1.24)
GFR, Estimated: 53 mL/min — ABNORMAL LOW (ref >60)
Glucose, Bld: 141 mg/dL — ABNORMAL HIGH (ref 70–99)
Potassium: 3.9 mmol/L (ref 3.5–5.1)
Sodium: 141 mmol/L (ref 135–145)
Total Bilirubin: 0.6 mg/dL (ref 0.3–1.2)
Total Protein: 6.4 g/dL — ABNORMAL LOW (ref 6.5–8.1)

## 2021-08-07 NOTE — ED Triage Notes (Signed)
EMS brings pt in from [place] for rectal bleeding that occurred when straining on BM, vss

## 2021-08-07 NOTE — Discharge Instructions (Addendum)
You have a bleeding hemorrhoids.  Blood counts are fine and you look well otherwise.  Keep your bottom clean and dry.  Use MiraLAX as needed to help keep your stools looser to prevent severe constipation.  Try to offload pressure from your bottom, use a doughnut pillow or shift weight onto your right hip.

## 2021-08-07 NOTE — ED Provider Notes (Addendum)
[facility] Provider Note    Event Date/Time   First MD Initiated Contact with Patient 08/07/21 2126     (approximate)   History   Rectal Bleeding   HPI  [name] is a 88 y.o. male who presents to the ED for evaluation of Rectal Bleeding   I reviewed PCP visit from 1/19.History of hypertension.  ASA 81 without proper anticoagulation. Patient resides at a local independent living facility with his wife.  He is ambulatory with a Rollator at baseline.  Patient presents to the ED for evaluation of anorectal bleeding and pain.  He reports a history of external hemorrhoids, and this feels similar.  He reports feeling more constipated over the past couple days, and has strained to pass bowel movements.  He reports hesitation to use OTC medications like MiraLAX and stool softeners because they are "artificial."  This afternoon, after passing stool, he reports red blood in the toilet bowl, so he went to a bath to get cleaned up and reports standing the bath water red as well.  Due to this, his wife called 38 for evaluation.  He reports no abdominal pain, fever or recent illnesses.  No hematuria or other bleeding.  Physical Exam   Triage Vital Signs: ED Triage Vitals  Enc Vitals Group     BP 08/07/21 2031 137/72     Pulse Rate 08/07/21 2030 81     Resp 08/07/21 2030 20     Temp 08/07/21 2030 99.1 F (37.3 C)     Temp Source 08/07/21 2030 Oral     SpO2 08/07/21 2030 95 %     Weight 08/07/21 2031 154 lb (69.9 kg)     Height 08/07/21 2031 5\' 9"  (1.753 m)     Head Circumference --      Peak Flow --      Pain Score 08/07/21 2031 5     Pain Loc --      Pain Edu? --      Excl. in [place]? --     Most recent vital signs: Vitals:   08/07/21 2030 08/07/21 2031  BP:  137/72  Pulse: 81   Resp: 20   Temp: 99.1 F (37.3 C)   SpO2: 95%     General: Awake, no distress.  Quite hard of hearing, but pleasant and conversational.  Quite spry for his age, able  to get up independently and transfer from a recliner to a stretcher. Is wearing a sling on his left arm due to a biceps injury from a few days ago when he was seen. CV:  Good peripheral perfusion.  Resp:  Normal effort.  Abd:  No distention.  Benign throughout Rectal exam, chaperoned by nursing, demonstrates an external hemorrhoid without evidence of thrombosis or active bleeding. MSK:  No deformity noted.  Neuro:  No focal deficits appreciated. Other:     ED Results / Procedures / Treatments   Labs (all labs ordered are listed, but only abnormal results are displayed) Labs Reviewed  COMPREHENSIVE METABOLIC PANEL - Abnormal; Notable for the following components:      Result Value   Glucose, Bld 141 (*)    BUN 38 (*)    Creatinine, Ser 1.25 (*)    Total Protein 6.4 (*)    GFR, Estimated 53 (*)    All other components within normal limits  CBC    EKG   RADIOLOGY   Official radiology report(s): No results found.  PROCEDURES and  INTERVENTIONS:  Procedures  Medications - No data to display   IMPRESSION / MDM / [place] / ED COURSE  I reviewed the triage vital signs and the nursing notes.  Pleasant 88 year old gentleman presents to the ED with anorectal bleeding, likely an external hemorrhoid, and suitable for outpatient management.  Vitals are normal without tachycardia.  He looks clinically well and has a normal hemoglobin.  Metabolic panel with slight prerenal azotemia, for which he received oral fluids.  His abdominal exam is benign, and he has a nonbleeding and nonthrombosed external hemorrhoid as a likely source of his symptoms.  We discussed management of hemorrhoids at home and return precautions for the ED. Less likely to represent diverticulitis, anorectal mass or cancers, brisk upper GI bleed or other acute intra-abdominal pathologies.      FINAL CLINICAL IMPRESSION(S) / ED DIAGNOSES   Final diagnoses:  Rectal bleeding  Other hemorrhoids      Rx / DC Orders   ED Discharge Orders     None        Note:  This document was prepared using Dragon voice recognition software and may include unintentional dictation errors.   Vladimir Crofts, MD 08/07/21 2831    Vladimir Crofts, MD 08/07/21 [phone]

## 2021-08-07 NOTE — ED Triage Notes (Signed)
Pt brought in via ems from [facility].  Pt had a BM today and had some rectal bleeding.  Recent constipation, hx hemorrhoids.  Pt alert  speech clear.

## 2021-08-07 NOTE — Addendum Note (Signed)
Addended by: Doristine Devoid on: 08/07/2021 12:10 PM   Modules accepted: Orders

## 2021-08-07 NOTE — ED Notes (Signed)
[facility] here for patient transport back to facility.

## 2021-08-07 NOTE — ED Notes (Signed)
[facility] contacted for transport back to facility.

## 2021-08-07 NOTE — Telephone Encounter (Signed)
Referral placed.

## 2021-08-09 ENCOUNTER — Other Ambulatory Visit: Payer: Self-pay

## 2021-08-09 ENCOUNTER — Encounter: Payer: Self-pay | Admitting: Podiatry

## 2021-08-09 ENCOUNTER — Ambulatory Visit (INDEPENDENT_AMBULATORY_CARE_PROVIDER_SITE_OTHER): Payer: Medicare Other | Admitting: Podiatry

## 2021-08-09 DIAGNOSIS — S46112A Strain of muscle, fascia and tendon of long head of biceps, left arm, initial encounter: Secondary | ICD-10-CM | POA: Diagnosis not present

## 2021-08-09 DIAGNOSIS — M79676 Pain in unspecified toe(s): Secondary | ICD-10-CM | POA: Diagnosis not present

## 2021-08-09 DIAGNOSIS — L84 Corns and callosities: Secondary | ICD-10-CM | POA: Diagnosis not present

## 2021-08-09 DIAGNOSIS — G6289 Other specified polyneuropathies: Secondary | ICD-10-CM

## 2021-08-09 DIAGNOSIS — B351 Tinea unguium: Secondary | ICD-10-CM | POA: Diagnosis not present

## 2021-08-09 NOTE — Progress Notes (Signed)
This patient returns to my office for at risk foot care.  This patient requires this care by a professional since this patient will be at risk due to having peripheral neuropathy.    This patient has developed an ulcerous callus/hematoma left forefoot.  He has been treating this skin lesion at home and there is no drainage.  He presents to the office wearing bandages. Patient has thick painful nails both feet.  This patient presents for at risk foot care today.  General Appearance  Alert, conversant and in no acute stress.  Vascular  Dorsalis pedis and posterior tibial  pulses are weakly  palpable  bilaterally.  Capillary return is within normal limits  bilaterally. Temperature is within normal limits  bilaterally.  Neurologic  Senn-Weinstein monofilament wire test diminished  bilaterally. Muscle power within normal limits bilaterally.  Nails Thick disfigured discolored nails with subungual debris  from hallux to fifth toes bilaterally. No evidence of bacterial infection or drainage bilaterally.  Orthopedic  No limitations of motion  feet .  No crepitus or effusions noted.  No bony pathology or digital deformities noted.  HAV 1st MPJ left foot.  Hammer toe second toe left foot. Plantar flexed second metatarsal left foot.  Skin  normotropic skin with no porokeratosis noted bilaterally.  No signs of infections or ulcers noted.   Hemorrhagic callus left forefoot.  No drainage or infection noted.  Ulcerous callus left forefoot.  Onychomycosis  B/L  Consent was obtained for treatment procedures. Debrided callus left forefoot with # 15 blade and dremel tool.  Debride nails with a nail nipper followed by dremel tool usage.   Padding dispensed to patient to use as needed.   Return office visit    10 weeks                 Told patient to return for periodic foot care and evaluation due to potential at risk complications.   Gardiner Barefoot DPM

## 2021-08-22 DIAGNOSIS — M6281 Muscle weakness (generalized): Secondary | ICD-10-CM | POA: Diagnosis not present

## 2021-08-22 DIAGNOSIS — R2681 Unsteadiness on feet: Secondary | ICD-10-CM | POA: Diagnosis not present

## 2021-08-22 DIAGNOSIS — R296 Repeated falls: Secondary | ICD-10-CM | POA: Diagnosis not present

## 2021-08-28 DIAGNOSIS — M6281 Muscle weakness (generalized): Secondary | ICD-10-CM | POA: Diagnosis not present

## 2021-08-28 DIAGNOSIS — R296 Repeated falls: Secondary | ICD-10-CM | POA: Diagnosis not present

## 2021-08-28 DIAGNOSIS — R2681 Unsteadiness on feet: Secondary | ICD-10-CM | POA: Diagnosis not present

## 2021-08-31 DIAGNOSIS — M6281 Muscle weakness (generalized): Secondary | ICD-10-CM | POA: Diagnosis not present

## 2021-08-31 DIAGNOSIS — R2681 Unsteadiness on feet: Secondary | ICD-10-CM | POA: Diagnosis not present

## 2021-08-31 DIAGNOSIS — R296 Repeated falls: Secondary | ICD-10-CM | POA: Diagnosis not present

## 2021-09-04 ENCOUNTER — Other Ambulatory Visit: Payer: Self-pay

## 2021-09-04 ENCOUNTER — Emergency Department: Payer: Medicare Other

## 2021-09-04 ENCOUNTER — Emergency Department
Admission: EM | Admit: 2021-09-04 | Discharge: 2021-09-04 | Disposition: A | Discharge status: Home / Self Care | Payer: Medicare Other | Attending: Emergency Medicine | Admitting: Emergency Medicine

## 2021-09-04 DIAGNOSIS — R0989 Other specified symptoms and signs involving the circulatory and respiratory systems: Secondary | ICD-10-CM | POA: Insufficient documentation

## 2021-09-04 DIAGNOSIS — M47812 Spondylosis without myelopathy or radiculopathy, cervical region: Secondary | ICD-10-CM | POA: Diagnosis not present

## 2021-09-04 DIAGNOSIS — I6523 Occlusion and stenosis of bilateral carotid arteries: Secondary | ICD-10-CM | POA: Diagnosis not present

## 2021-09-04 DIAGNOSIS — T189XXA Foreign body of alimentary tract, part unspecified, initial encounter: Secondary | ICD-10-CM

## 2021-09-04 MED ORDER — LIDOCAINE VISCOUS HCL 2 % MT SOLN
15.0000 mL | Freq: Once | OROMUCOSAL | Status: AC
  Administered 2021-09-04: 15 mL via ORAL
  Filled 2021-09-04: qty 15

## 2021-09-04 MED ORDER — ALUM & MAG HYDROXIDE-SIMETH 200-200-20 MG/5ML PO SUSP
30.0000 mL | Freq: Once | ORAL | Status: AC
  Administered 2021-09-04: 30 mL via ORAL
  Filled 2021-09-04: qty 30

## 2021-09-04 MED ORDER — BENZOCAINE 20 % MT AERO
INHALATION_SPRAY | Freq: Once | OROMUCOSAL | Status: AC
  Administered 2021-09-04: 1 via OROMUCOSAL
  Filled 2021-09-04: qty 57

## 2021-09-04 NOTE — ED Notes (Signed)
Pt given cup of soda to try per verbal from [place]. Pt drinks soda with no difficulty; pt continues to report same discomfort as earlier. EDP Archie Balboa notified.

## 2021-09-04 NOTE — ED Notes (Signed)
Pt up to toilet. Glidoscope to bedside per verbal from [facility]. Numbing medication to [place].

## 2021-09-04 NOTE — ED Triage Notes (Signed)
Pt here after swallowing a walnut and now feels like it is stuck in his throat. Pt denies pain. Pt has not been able to swallow food since the event. Pt able to speak in complete sentences.

## 2021-09-04 NOTE — ED Provider Notes (Signed)
° °  [facility] Provider Note    Event Date/Time   First MD Initiated Contact with Patient 09/04/21 1415     (approximate)  History   Chief Complaint: Swallowed Foreign Body  HPI  [name] is a 88 y.o. male with a past medical history of anemia, gastric reflux, hyperlipidemia, presents to the emergency department for foreign body sensation.  According to the patient he states he was eating nuts today.  States he swallowed a larger chunk of walnut and now feels like it is stuck in his throat.  Patient states he has been able to swallow liquids but feels like he is going to get choked if he swallows a big gulp of liquid.  Denies vomiting.  Patient is handling secretions without issue.  Denies any history of esophageal obstructions in the past.  Denies any trouble breathing or sensation of choking.   Physical Exam   Triage Vital Signs: ED Triage Vitals  Enc Vitals Group     BP 09/04/21 1404 (!) 151/71     Pulse Rate 09/04/21 1404 81     Resp 09/04/21 1404 16     Temp 09/04/21 1404 99.2 F (37.3 C)     Temp Source 09/04/21 1404 Oral     SpO2 09/04/21 1404 97 %     Weight 09/04/21 1402 142 lb (64.4 kg)     Height 09/04/21 1402 5\' 9"  (1.753 m)     Head Circumference --      Peak Flow --      Pain Score 09/04/21 1402 0     Pain Loc --      Pain Edu? --      Excl. in [place]? --     Most recent vital signs: Vitals:   09/04/21 1413 09/04/21 1422  BP: (!) 148/99 (!) 148/70  Pulse: 93   Resp:    Temp:    SpO2: 97%     General: Awake, no distress.  CV:  Good peripheral perfusion.  Regular rate and rhythm  Resp:  Normal effort.  Equal breath sounds bilaterally.  Abd:  No distention.  Soft, nontender.  No rebound or guarding.   ED Results / Procedures / Treatments   MEDICATIONS ORDERED IN ED: Medications - No data to display   IMPRESSION / MDM / [place] / ED COURSE  I reviewed the triage vital signs and the nursing notes.  Patient  presents to the emergency department with a foreign body sensation in his throat.  I give the patient water in the emergency department he is able to swallow water without difficulty.  Making esophageal obstruction less likely.  We will obtain a CT scan of the neck to evaluate for possible foreign body as the patient states it feels like it is stuck in his throat.  Possibly could have foreign body in the vallecula.  No choking no coughing or shortness of breath.  Do not suspect any tracheal or lung involvement.  FINAL CLINICAL IMPRESSION(S) / ED DIAGNOSES   Throat foreign body sensation   Note:  This document was prepared using Dragon voice recognition software and may include unintentional dictation errors.   Harvest Dark, MD 09/05/21 [phone]

## 2021-09-04 NOTE — Discharge Instructions (Signed)
Please follow up with GI if the symptoms do not improve. Please seek medical attention for any high fevers, chest pain, shortness of breath, change in behavior, persistent vomiting, bloody stool or any other new or concerning symptoms.

## 2021-09-04 NOTE — ED Notes (Signed)
This RN at bedside with [place]. See triage note. Pt in NAD; resp reg/unlabored. HOB 75-80 degrees. Assisted pt to drink some water per verbal from EDP. Pt able to drink water without choking, gagging, or vomiting. Wife with pt.

## 2021-09-04 NOTE — ED Notes (Signed)
Pt denies any needs currently. Wife remains at bedside.

## 2021-09-05 ENCOUNTER — Ambulatory Visit: Payer: Self-pay

## 2021-09-05 NOTE — Progress Notes (Signed)
?  ? ? ?Established patient visit ? ? ?Patient: [name]   DOB: 07/24/1924   88 y.o. Male  MRN: [id number] ?Visit Date: 09/06/2021 ? ?Today's healthcare provider: Lavon Paganini, MD  ? ?Chief Complaint  ?Patient presents with  ? Dysphagia  ? ?Subjective  ?  ?HPI  ?Follow up ER visit ? ?Patient was seen in ER for foreign body in throat on 10/01/21. ?He was treated for foreign body in throat. ?Treatment for this included CT soft tissue neck. ?He reports excellent compliance with treatment. ?He reports this condition is Unchanged. ? ?Patient's wife states that patient has been suffering with difficulty swallowing for the past 2weeks, yesterday patient had difficulty swallowing after meal and nurse at [facility] had to do heimlich maneuver and patient was sent to ED for evaluation. Wife states that CT was normal , discharge report states to follow up with GI if symptoms did not improve. Wife states earliest appt is May with GI and she is concerned about patient, she states that patient had a bite of bread today and spit it out, she states that he successfully took a bite of banana and states that he has been drinking ensure for this afternoon with no complications. Wife states that patient complains that it still feels like he has something in his throat. ?----------------------------------------------------------------------------------------- ? ? ?Medications: ?Outpatient Medications Prior to Visit  ?Medication Sig  ? amLODipine (NORVASC) 5 MG tablet Take 1 tablet by mouth once daily  ? aspirin EC 81 MG tablet Take 81 mg by mouth daily. Swallow whole.  ? busPIRone (BUSPAR) 10 MG tablet Take 1 tablet (10 mg total) by mouth 2 (two) times daily.  ? fluticasone (FLONASE) 50 MCG/ACT nasal spray Place 1 spray into both nostrils daily.  ? furosemide (LASIX) 20 MG tablet Take 3 tablets (60 mg total) by mouth daily.  ? gabapentin (NEURONTIN) 100 MG capsule Take 1 capsule by mouth at bedtime  ? Multiple Vitamin  (MULTI-VITAMIN) tablet Take 1 tablet by mouth daily.   ? naproxen sodium (ALEVE) 220 MG tablet Take 1 tablet (220 mg total) by mouth daily as needed (pain/headache).  ? Polyethyl Glycol-Propyl Glycol (SYSTANE OP) Place 1 drop into both eyes daily as needed (dry eye).  ? ?No facility-administered medications prior to visit.  ? ? ?Review of Systems  ?Constitutional: Negative.   ?HENT:  Positive for congestion, postnasal drip and rhinorrhea.   ?Respiratory: Negative.    ?Cardiovascular: Negative.   ?Gastrointestinal:  Negative for abdominal pain, anal bleeding, blood in stool, constipation, diarrhea, nausea and vomiting.  ? ? ?  Objective  ?  ?BP (!) 151/80 (BP Location: Left Arm, Patient Position: Sitting, Cuff Size: Normal)   Pulse 76   Resp 18   Ht 5\' 8"  (1.727 m)   Wt 133 lb 9.6 oz (60.6 kg)   BMI 20.31 kg/m?  ? ? ?Physical Exam ?Vitals reviewed.  ?Constitutional:   ?   General: He is not in acute distress. ?   Appearance: Normal appearance. He is not diaphoretic.  ?HENT:  ?   Head: Normocephalic and atraumatic.  ?Eyes:  ?   General: No scleral icterus. ?   Conjunctiva/sclera: Conjunctivae normal.  ?Cardiovascular:  ?   Rate and Rhythm: Normal rate and regular rhythm.  ?   Pulses: Normal pulses.  ?   Heart sounds: Normal heart sounds. No murmur heard. ?Pulmonary:  ?   Effort: Pulmonary effort is normal. No respiratory distress.  ?   Breath sounds:  Normal breath sounds. No wheezing or rhonchi.  ?Abdominal:  ?   General: There is no distension.  ?   Palpations: Abdomen is soft.  ?   Tenderness: There is no abdominal tenderness.  ?Musculoskeletal:  ?   Cervical back: Neck supple.  ?   Right lower leg: No edema.  ?   Left lower leg: No edema.  ?Lymphadenopathy:  ?   Cervical: No cervical adenopathy.  ?Skin: ?   General: Skin is warm and dry.  ?   Capillary Refill: Capillary refill takes less than 2 seconds.  ?   Findings: No rash.  ?Neurological:  ?   Mental Status: He is alert and oriented to person, place, and  time.  ?   Cranial Nerves: No cranial nerve deficit.  ?Psychiatric:     ?   Mood and Affect: Mood normal.     ?   Behavior: Behavior normal.  ?  ? ? ?No results found for any visits on 09/06/21. ? Assessment & Plan  ?  ? ?Problem List Items Addressed This Visit   ? ?  ? Respiratory  ? Allergic rhinitis with postnasal drip  ?  Longstanding issue with ongoing postnasal drip causing him to sleep on an incline ?Continue antihistamine and Nasonex ?Is not improving, patient requests ENT consult, so referral placed today ?  ?  ? Relevant Orders  ? Ambulatory referral to ENT  ?  ? Digestive  ? Esophageal dysphagia - Primary  ?  New problem x2 weeks ?No impaction or obstruction on exam or imaging in ED this week ?He is able to swallow liquids okay ?We discussed dysphagia diet with bite-size food avoiding things like steak and rice ?Coordinated with GI, Dr. Vicente Males, and patient will be able to be seen next week for further evaluation and management ?Discussed red flags and return/emergent precautions ?We will follow-up on his weight loss after GI evaluation when he is able to eat as he would like again ?  ?  ? Relevant Orders  ? Ambulatory referral to Gastroenterology  ? ?Other Visit Diagnoses   ? ? Globus sensation      ? Relevant Orders  ? Ambulatory referral to Gastroenterology  ? ?  ?  ? ?Return if symptoms worsen or fail to improve.  ?   ? ?I, Lavon Paganini, MD, have reviewed all documentation for this visit. The documentation on 09/06/21 for the exam, diagnosis, procedures, and orders are all accurate and complete. ? ? ?[name], MD, MPH ?[place] ?[facility]   ?

## 2021-09-05 NOTE — Telephone Encounter (Signed)
?  Chief Complaint: dysphagia  ?Symptoms: unable to swallow solids or big gulps of liquid ?Frequency: yesterday was worse but going on for 1-2 weeks  ?Pertinent Negatives: Patient denies no difficulty breathing ?Disposition: [] ED /[] Urgent Care (no appt availability in office) / [x] Appointment(In office/virtual)/ []  [facility]/ [] Home Care/ [] Refused Recommended Disposition /[] [facility]/ []  Follow-up with PCP ?Additional Notes: spoke with pt's wife. Pt prefers to see Dr. B and would like an appt tomorrow or Friday if possibe. Scheduled for 09/11/21 and advised her I would have staff call her back after lunch to follow up on if there were any open appts.  ? ? ?Summary: dysphagia  ? Patient was referred to gastro and specialist unable to see patient until May. Caller would like to know what should patient do until then, he is unable to swallow bread, mashed potatoes and ice cream   ?  ? ?Reason for Disposition ? [1] Swallowing difficulty AND [2] cause unknown (Exception: difficulty swallowing is a chronic symptom) ? ?Answer Assessment - Initial Assessment Questions ?1. SYMPTOM: "Are you having difficulty swallowing liquids, solids, or both?" ?    Solids  ?2. ONSET: "When did the swallowing problems begin?"  ?    Going on for 1-2 weeks but yesterday gotten worse  ?4. CHRONIC/RECURRENT: "Is this a new problem for you?"  If no, ask: "How long have you had this problem?" (e.g., days, weeks, months)  ?    New problem ?5. OTHER SYMPTOMS: "Do you have any other symptoms?" (e.g., difficulty breathing, sore throat, swollen tongue, chest pain) ? ?Protocols used: Swallowing Difficulty-A-AH ? ?

## 2021-09-05 NOTE — Telephone Encounter (Signed)
Reviewed over triage note with patients wife below. She states that patient has been suffering with difficulty swallowing for the past 2weeks, yesterday she reports patient had difficulty swallowing after meal and nurse at [facility] had to do heimlich maneuver and patient was sent to ED for evaluation. Wife states that CT was normal , discharge report states to follow up with GI if symptoms did not improve. Wife states earliest appt is May with GI and she is concerned about patient, she states that patient had a bite of bread today and spit it out, she states that he successfully took a bite of banana and states that he has been drinking ensure for this afternoon with no complications. Wife states that patient complains that it still feels like he has something in his throat. Wife also reports patient has had a lot of drainage today and wants to know if okay to crush up Zyrtec and give to patient? Wife insisted that patient see Dr. Brita Romp, I scheduled appt for tomorrow at 2:40PM. FYI. KW ?

## 2021-09-06 ENCOUNTER — Encounter: Payer: Self-pay | Admitting: Family Medicine

## 2021-09-06 ENCOUNTER — Other Ambulatory Visit: Payer: Self-pay

## 2021-09-06 ENCOUNTER — Ambulatory Visit: Payer: Medicare Other | Admitting: Physician Assistant

## 2021-09-06 ENCOUNTER — Ambulatory Visit (INDEPENDENT_AMBULATORY_CARE_PROVIDER_SITE_OTHER): Payer: Medicare Other | Admitting: Family Medicine

## 2021-09-06 ENCOUNTER — Telehealth: Payer: Self-pay

## 2021-09-06 VITALS — BP 151/80 | HR 76 | Resp 18 | Ht 68.0 in | Wt 133.6 lb

## 2021-09-06 DIAGNOSIS — J309 Allergic rhinitis, unspecified: Secondary | ICD-10-CM | POA: Diagnosis not present

## 2021-09-06 DIAGNOSIS — R0982 Postnasal drip: Secondary | ICD-10-CM | POA: Diagnosis not present

## 2021-09-06 DIAGNOSIS — R0989 Other specified symptoms and signs involving the circulatory and respiratory systems: Secondary | ICD-10-CM

## 2021-09-06 DIAGNOSIS — R1319 Other dysphagia: Secondary | ICD-10-CM

## 2021-09-06 DIAGNOSIS — R09A2 Foreign body sensation, throat: Secondary | ICD-10-CM

## 2021-09-06 DIAGNOSIS — R1310 Dysphagia, unspecified: Secondary | ICD-10-CM | POA: Insufficient documentation

## 2021-09-06 NOTE — Telephone Encounter (Signed)
Noted  

## 2021-09-06 NOTE — Telephone Encounter (Signed)
CALLED PATIENT NO ANSWER LEFT VOICEMAIL FOR A CALL BACK ? ?

## 2021-09-06 NOTE — Assessment & Plan Note (Signed)
Longstanding issue with ongoing postnasal drip causing him to sleep on an incline ?Continue antihistamine and Nasonex ?Is not improving, patient requests ENT consult, so referral placed today ?

## 2021-09-06 NOTE — Assessment & Plan Note (Signed)
New problem x2 weeks ?No impaction or obstruction on exam or imaging in ED this week ?He is able to swallow liquids okay ?We discussed dysphagia diet with bite-size food avoiding things like steak and rice ?Coordinated with GI, Dr. Vicente Males, and patient will be able to be seen next week for further evaluation and management ?Discussed red flags and return/emergent precautions ?We will follow-up on his weight loss after GI evaluation when he is able to eat as he would like again ?

## 2021-09-10 ENCOUNTER — Other Ambulatory Visit: Payer: Self-pay

## 2021-09-11 ENCOUNTER — Other Ambulatory Visit: Payer: Self-pay

## 2021-09-11 ENCOUNTER — Ambulatory Visit (INDEPENDENT_AMBULATORY_CARE_PROVIDER_SITE_OTHER): Payer: Medicare Other | Admitting: Gastroenterology

## 2021-09-11 ENCOUNTER — Encounter: Payer: Self-pay | Admitting: Gastroenterology

## 2021-09-11 ENCOUNTER — Ambulatory Visit: Payer: Medicare Other | Admitting: Family Medicine

## 2021-09-11 VITALS — BP 152/76 | HR 82 | Temp 98.3°F | Wt 152.8 lb

## 2021-09-11 DIAGNOSIS — R1319 Other dysphagia: Secondary | ICD-10-CM

## 2021-09-11 NOTE — Progress Notes (Signed)
?  ?[name] MD, MRCP(U.K) ?[address]  ?[address]  ?[place], [place] [address]  ?Main: [phone]  ?Fax: [phone] ? ? ?Gastroenterology Consultation ? ?Referring Provider:     [name], MD ?Primary Care Physician:  [name], MD ?Primary Gastroenterologist:  Dr. [name]  ?Reason for Consultation:     dysphagia  ?      ? HPI:   ?[name] is a 88 y.o. y/o male referred for consultation & management  by Dr. Brita Romp, Dionne Bucy, MD. ? ?She presented to the emergency room on 09/04/2021 with difficulty swallowing after she ate some nuts.  He felt like large pieces of walnut were stuck in his throat.  A CT scan of the neck was obtained but no foreign body was seen and the patient was discharged.  The patient is subsequently seen by Dr. Brita Romp on 09/06/2021.  ?He had persistent symptoms and hence was referred to see [place]. ? ?Since ER visit he continues to have solid food dysphagia.  No issues with liquids.  He is having to pur?e all his foods and consume.  No weight loss.  Discussed with him in the presence of his wife and daughter Mickel Baas.  No regurgitation of foods.  Not on any blood thinners. ? ?Past Medical History:  ?Diagnosis Date  ? Anemia   ? Aortic valve sclerosis   ? Arthritis   ? Bladder outlet obstruction   ? Erectile dysfunction   ? GERD (gastroesophageal reflux disease)   ? h/o  ? Hard of hearing   ? HBP (high blood pressure)   ? Heart murmur   ? History of kidney stones   ? History of nephrolithiasis   ? HLD (hyperlipidemia)   ? Incomplete bladder emptying   ? Mitral regurgitation   ? Neuropathy   ? Nocturia   ? Osteoporosis   ? Pre-diabetes   ? Prostate cancer ([place])   ? Shingles 07/08/2012  ? Sinus problem   ? Stomach ulcer   ? Stroke ([place]) 07/08/2010  ? Urinary frequency   ? Urinary leakage   ? ? ?Past Surgical History:  ?Procedure Laterality Date  ? CATARACT EXTRACTION  2000, 2001  ? CHOLECYSTECTOMY  07/08/1970  ? CYSTOSCOPY/URETEROSCOPY/HOLMIUM LASER/STENT  PLACEMENT Left 03/23/2021  ? Procedure: CYSTOSCOPY/URETEROSCOPY/HOLMIUM LASER/STENT PLACEMENT;  Surgeon: Billey Co, MD;  Location: [facility] ORS;  Service: Urology;  Laterality: Left;  ? PROSTATE SURGERY  2008/2009  ? cryotherapy  ? TONSILLECTOMY    ? UMBILICAL HERNIA REPAIR    ? ? ?Prior to Admission medications   ?Medication Sig Start Date End Date Taking? Authorizing Provider  ?amLODipine (NORVASC) 5 MG tablet Take 1 tablet by mouth once daily 06/28/21   [name], MD  ?aspirin EC 81 MG tablet Take 81 mg by mouth daily. Swallow whole.    [provider]  ?busPIRone (BUSPAR) 10 MG tablet Take 1 tablet (10 mg total) by mouth 2 (two) times daily. 07/26/21   [name], MD  ?fluticasone (FLONASE) 50 MCG/ACT nasal spray Place 1 spray into both nostrils daily. 11/27/20   [name], MD  ?furosemide (LASIX) 40 MG tablet 1 tablet daily.    [provider]  ?gabapentin (NEURONTIN) 100 MG capsule Take 1 capsule by mouth at bedtime 07/06/21   [name], MD  ?Multiple Vitamin (MULTI-VITAMIN) tablet Take 1 tablet by mouth daily.     [provider]  ?naproxen sodium (ALEVE) 220 MG tablet Take 1  tablet (220 mg total) by mouth daily as needed (pain/headache). 08/05/21   Victorino Dike, FNP  ?Polyethyl Glycol-Propyl Glycol (SYSTANE OP) Place 1 drop into both eyes daily as needed (dry eye).    [provider]  ? ? ?Family History  ?Problem Relation Age of Onset  ? Cancer Mother   ? Stroke Father   ? Stroke Sister   ? Prostate cancer Brother   ? Kidney disease Neg Hx   ? Kidney cancer Neg Hx   ? Bladder Cancer Neg Hx   ?  ? ?Social History  ? ?Tobacco Use  ? Smoking status: Former  ?  Years: 10.00  ?  Types: Cigarettes  ?  Quit date: 07/08/1962  ?  Years since quitting: 59.2  ? Smokeless tobacco: Never  ? Tobacco comments:  ?  quit 1964  ?Vaping Use  ? Vaping Use: Never used  ?Substance Use Topics  ? Alcohol use: Yes  ?  Alcohol/week: 1.0 - 2.0 standard  drink  ?  Types: 1 Glasses of wine per week  ?  Comment: every 2-3 mths, 1 glass of wine daily    ? Drug use: No  ? ? ?Allergies as of 09/11/2021 - Review Complete 09/11/2021  ?Allergen Reaction Noted  ? Toviaz [fesoterodine fumarate er] Other (See Comments) 01/03/2015  ? ? ?Review of Systems:    ?All systems reviewed and negative except where noted in HPI. ? ? Physical Exam:  ?BP (!) 152/76   Pulse 82   Temp 98.3 ?F (36.8 ?C) (Oral)   Wt 152 lb 12.8 oz (69.3 kg)   BMI 23.23 kg/m?  ?No LMP for male patient. ?Psych:  Alert and cooperative. Normal mood and affect. ?General:   Alert,  Well-developed, well-nourished, pleasant and cooperative in NAD ?Head:  Normocephalic and atraumatic. ?Eyes:  Sclera clear, no icterus.   Conjunctiva pink. ?Abdomen:  Normal bowel sounds.  No bruits.  Soft, non-tender and non-distended without masses, hepatosplenomegaly or hernias noted.  No guarding or rebound tenderness.    ?Neurologic:  Alert and oriented x3;  grossly normal neurologically. ?Psych:  Alert and cooperative. Normal mood and affect. ? ?Imaging Studies: ?CT Soft Tissue Neck Wo Contrast ? ?Result Date: 09/04/2021 ?CLINICAL DATA:  Swallowed walnut.  Feels foreign body in throat. EXAM: CT NECK WITHOUT CONTRAST TECHNIQUE: Multidetector CT imaging of the neck was performed following the standard protocol without intravenous contrast. RADIATION DOSE REDUCTION: This exam was performed according to the departmental dose-optimization program which includes automated exposure control, adjustment of the mA and/or kV according to patient size and/or use of iterative reconstruction technique. COMPARISON:  None. FINDINGS: Pharynx and larynx: Normal. No mass or swelling. No radiopaque foreign body in the airway or pharynx. Salivary glands: No inflammation, mass, or stone. Thyroid: Negative Lymph nodes: No enlarged lymph nodes in the neck. Vascular: Limited vascular evaluation without intravenous contrast. Mild atherosclerotic  calcification the carotid bifurcation bilaterally. Atherosclerotic calcification aortic arch. Limited intracranial: Generalized atrophy.  No acute abnormality. Visualized orbits: No orbital lesion.  Bilateral cataract extraction Mastoids and visualized paranasal sinuses: Negative Skeleton: Cervical spondylosis.  No acute skeletal abnormality. Upper chest: Lung apices clear bilaterally Other: None IMPRESSION: No radiopaque foreign body is seen in the airway No mass or adenopathy in the neck. Electronically Signed   By: Franchot Gallo M.D.   On: 09/04/2021 15:12   ? ?Assessment and Plan:  ? ?Jacque RITHVIK ORCUTT is a 88 y.o. y/o male has been referred for dysphagia .  Recent presentation to the ER with food impaction that spontaneously passed, still has dysphagia for solids ongoing, has been crushing all foods and consuming . Discussed options of barium vs endoscopy . Benefits vs risks discussed with him and daughter Mickel Baas over phone and decided to proceed with EGD as he is symptomatic.  ? ?Plan ?1. EGD before the weekend as he still has dysphagia for solids  ? ? ?I have discussed alternative options, risks & benefits,  which include, but are not limited to, bleeding, infection, perforation,respiratory complication & drug reaction.  The patient agrees with this plan & written consent will be obtained.   ? ? ? ?Follow up in as needed ? ?Dr [name] MD,MRCP(U.K) ? ?

## 2021-09-13 ENCOUNTER — Ambulatory Visit: Payer: Medicare Other | Admitting: Anesthesiology

## 2021-09-13 ENCOUNTER — Encounter
Admission: RE | Disposition: A | Discharge status: Home / Self Care | Payer: Self-pay | Source: Home / Self Care | Attending: Gastroenterology

## 2021-09-13 ENCOUNTER — Encounter: Payer: Self-pay | Admitting: Gastroenterology

## 2021-09-13 ENCOUNTER — Ambulatory Visit
Admission: RE | Admit: 2021-09-13 | Discharge: 2021-09-13 | Disposition: A | Discharge status: Home / Self Care | Payer: Medicare Other | Attending: Gastroenterology | Admitting: Gastroenterology

## 2021-09-13 DIAGNOSIS — Z8546 Personal history of malignant neoplasm of prostate: Secondary | ICD-10-CM | POA: Insufficient documentation

## 2021-09-13 DIAGNOSIS — I10 Essential (primary) hypertension: Secondary | ICD-10-CM | POA: Insufficient documentation

## 2021-09-13 DIAGNOSIS — G629 Polyneuropathy, unspecified: Secondary | ICD-10-CM | POA: Diagnosis not present

## 2021-09-13 DIAGNOSIS — Z87891 Personal history of nicotine dependence: Secondary | ICD-10-CM | POA: Insufficient documentation

## 2021-09-13 DIAGNOSIS — F419 Anxiety disorder, unspecified: Secondary | ICD-10-CM | POA: Insufficient documentation

## 2021-09-13 DIAGNOSIS — F32A Depression, unspecified: Secondary | ICD-10-CM | POA: Insufficient documentation

## 2021-09-13 DIAGNOSIS — I69351 Hemiplegia and hemiparesis following cerebral infarction affecting right dominant side: Secondary | ICD-10-CM | POA: Diagnosis not present

## 2021-09-13 DIAGNOSIS — K449 Diaphragmatic hernia without obstruction or gangrene: Secondary | ICD-10-CM | POA: Diagnosis not present

## 2021-09-13 DIAGNOSIS — Z8711 Personal history of peptic ulcer disease: Secondary | ICD-10-CM | POA: Insufficient documentation

## 2021-09-13 DIAGNOSIS — K222 Esophageal obstruction: Secondary | ICD-10-CM | POA: Diagnosis not present

## 2021-09-13 DIAGNOSIS — I739 Peripheral vascular disease, unspecified: Secondary | ICD-10-CM | POA: Diagnosis not present

## 2021-09-13 DIAGNOSIS — R1319 Other dysphagia: Secondary | ICD-10-CM | POA: Diagnosis not present

## 2021-09-13 DIAGNOSIS — R1310 Dysphagia, unspecified: Secondary | ICD-10-CM | POA: Insufficient documentation

## 2021-09-13 HISTORY — PX: ESOPHAGOGASTRODUODENOSCOPY (EGD) WITH PROPOFOL: SHX5813

## 2021-09-13 SURGERY — ESOPHAGOGASTRODUODENOSCOPY (EGD) WITH PROPOFOL
Anesthesia: General

## 2021-09-13 MED ORDER — PROPOFOL 10 MG/ML IV BOLUS
INTRAVENOUS | Status: DC | PRN
  Administered 2021-09-13: 20 mg via INTRAVENOUS

## 2021-09-13 MED ORDER — PROPOFOL 500 MG/50ML IV EMUL
INTRAVENOUS | Status: DC | PRN
  Administered 2021-09-13: 145 ug/kg/min via INTRAVENOUS

## 2021-09-13 MED ORDER — PROPOFOL 10 MG/ML IV BOLUS
INTRAVENOUS | Status: AC
  Filled 2021-09-13: qty 20

## 2021-09-13 MED ORDER — SODIUM CHLORIDE 0.9 % IV SOLN
INTRAVENOUS | Status: DC
  Administered 2021-09-13: 11:00:00 1000 mL via INTRAVENOUS

## 2021-09-13 NOTE — Op Note (Signed)
[facility] ?Gastroenterology ?Patient Name: [name] ?Procedure Date: 09/13/2021 11:40 AM ?MRN: [id number] ?Account #: [id number] ?Date of Birth: 1924/12/13 ?Admit Type: Outpatient ?Age: 88 ?Room: [facility] ENDO ROOM 3 ?Gender: Male ?Note Status: Finalized ?Instrument Name: Upper Endoscope [id number] ?Procedure:             Upper GI endoscopy ?Indications:           Dysphagia ?Providers:             [name] MD, MD ?Referring MD:          Dionne Bucy. Bacigalupo (Referring MD) ?Medicines:             Monitored Anesthesia Care ?Complications:         No immediate complications. ?Procedure:             Pre-Anesthesia Assessment: ?                       - Prior to the procedure, a History and Physical was  ?                       performed, and patient medications, allergies and  ?                       sensitivities were reviewed. The patient's tolerance  ?                       of previous anesthesia was reviewed. ?                       - The risks and benefits of the procedure and the  ?                       sedation options and risks were discussed with the  ?                       patient. All questions were answered and informed  ?                       consent was obtained. ?                       - ASA Grade Assessment: III - A patient with severe  ?                       systemic disease. ?                       After obtaining informed consent, the endoscope was  ?                       passed under direct vision. Throughout the procedure,  ?                       the patient's blood pressure, pulse, and oxygen  ?                       saturations were monitored continuously. The Endoscope  ?                       was introduced through  the mouth, and advanced to the  ?                       third part of duodenum. The upper GI endoscopy was  ?                       accomplished with ease. The patient tolerated the  ?                       procedure well. ?Findings: ?     One  benign-appearing, intrinsic mild stenosis was found at the  ?     gastroesophageal junction. This stenosis measured 1.5 cm (inner  ?     diameter) x 1 cm (in length). The stenosis was traversed. A TTS dilator  ?     was passed through the scope. Dilation with a 15-16.5-18 mm balloon  ?     dilator was performed to 18 mm. The dilation site was examined and  ?     showed mild mucosal disruption. ?     A medium-sized hiatal hernia was present. ?     The cardia and gastric fundus were normal on retroflexion. ?     The examined duodenum was normal. ?     After dilating the mild stricture i pulled the inflated baloon at 18 mm  ?     all the way up the esophagus till the 20 cm mark . no change noted in  ?     mucosa after ?Impression:            - Benign-appearing esophageal stenosis. Dilated. ?                       - Medium-sized hiatal hernia. ?                       - Normal examined duodenum. ?                       - No specimens collected. ?Recommendation:        - Discharge patient to home (with escort). ?                       - Resume previous diet. ?                       - Continue present medications. ?                       - Return to my office PRN. ?Procedure Code(s):     --- Professional --- ?                       [phone], Esophagogastroduodenoscopy, flexible,  ?                       transoral; with transendoscopic balloon dilation of  ?                       esophagus (less than 30 mm diameter) ?Diagnosis Code(s):     --- Professional --- ?                       K22.2, Esophageal obstruction ?  K44.9, Diaphragmatic hernia without obstruction or  ?                       gangrene ?                       R13.10, Dysphagia, unspecified ?CPT copyright 2019 [employer]. All rights reserved. ?The codes documented in this report are preliminary and upon coder review may  ?be revised to meet current compliance requirements. ?[name], MD ?[name] MD, MD ?09/13/2021 11:52:05  AM ?This report has been signed electronically. ?Number of Addenda: 0 ?Note Initiated On: 09/13/2021 11:40 AM ?Estimated Blood Loss:  Estimated blood loss: none. ?     [facility] ?

## 2021-09-13 NOTE — Anesthesia Postprocedure Evaluation (Signed)
Anesthesia Post Note ? ?Patient: [name] ? ?Procedure(s) Performed: ESOPHAGOGASTRODUODENOSCOPY (EGD) WITH PROPOFOL ? ?Patient location during evaluation: Endoscopy ?Anesthesia Type: General ?Level of consciousness: awake and alert ?Pain management: pain level controlled ?Vital Signs Assessment: post-procedure vital signs reviewed and stable ?Respiratory status: spontaneous breathing, nonlabored ventilation, respiratory function stable and patient connected to nasal cannula oxygen ?Cardiovascular status: blood pressure returned to baseline and stable ?Postop Assessment: no apparent nausea or vomiting ?Anesthetic complications: no ? ? ?No notable events documented. ? ? ?Last Vitals:  ?Vitals:  ? 09/13/21 1154 09/13/21 1224  ?BP: (!) 109/59 (!) 148/87  ?Pulse:    ?Resp:    ?Temp: (!) 35.7 ?C   ?SpO2:    ?  ?Last Pain:  ?Vitals:  ? 09/13/21 1224  ?TempSrc:   ?PainSc: 0-No pain  ? ? ?  ?  ?  ?  ?  ?  ? ?Martha Clan ? ? ? ? ?

## 2021-09-13 NOTE — Transfer of Care (Signed)
Immediate Anesthesia Transfer of Care Note ? ?Patient: [name] ? ?Procedure(s) Performed: ESOPHAGOGASTRODUODENOSCOPY (EGD) WITH PROPOFOL ? ?Patient Location: PACU ? ?Anesthesia Type:General ? ?Level of Consciousness: awake and alert  ? ?Airway & Oxygen Therapy: Patient Spontanous Breathing and Patient connected to nasal cannula oxygen ? ?Post-op Assessment: Report given to RN and Post -op Vital signs reviewed and stable ? ?Post vital signs: Reviewed and stable ? ?Last Vitals:  ?Vitals Value Taken Time  ?BP    ?Temp    ?Pulse 66 09/13/21 1154  ?Resp 18 09/13/21 1154  ?SpO2 97 % 09/13/21 1154  ?Vitals shown include unvalidated device data. ? ?Last Pain:  ?Vitals:  ? 09/13/21 1054  ?TempSrc: Temporal  ?PainSc: 0-No pain  ?   ? ?  ? ?Complications: No notable events documented. ?

## 2021-09-13 NOTE — H&P (Signed)
? ? ? ?[name], MD ?[address] [place], [place], [place], [address] ?[address], [place], [place], [place], [address] ?Phone: [phone]  ?Fax: [phone] ? ?Primary Care Physician:  [name], MD ? ? ?Pre-Procedure History & Physical: ?HPI:  [name] is a 88 y.o. male is here for an endoscopy  ?  ?Past Medical History:  ?Diagnosis Date  ? Anemia   ? Aortic valve sclerosis   ? Arthritis   ? Bladder outlet obstruction   ? Erectile dysfunction   ? GERD (gastroesophageal reflux disease)   ? h/o  ? Hard of hearing   ? HBP (high blood pressure)   ? Heart murmur   ? History of kidney stones   ? History of nephrolithiasis   ? HLD (hyperlipidemia)   ? Incomplete bladder emptying   ? Mitral regurgitation   ? Neuropathy   ? Nocturia   ? Osteoporosis   ? Pre-diabetes   ? Prostate cancer ([place])   ? Shingles 07/08/2012  ? Sinus problem   ? Stomach ulcer   ? Stroke ([place]) 07/08/2010  ? Urinary frequency   ? Urinary leakage   ? ? ?Past Surgical History:  ?Procedure Laterality Date  ? CATARACT EXTRACTION  2000, 2001  ? CHOLECYSTECTOMY  07/08/1970  ? CYSTOSCOPY/URETEROSCOPY/HOLMIUM LASER/STENT PLACEMENT Left 03/23/2021  ? Procedure: CYSTOSCOPY/URETEROSCOPY/HOLMIUM LASER/STENT PLACEMENT;  Surgeon: Billey Co, MD;  Location: [facility];  Service: Urology;  Laterality: Left;  ? PROSTATE SURGERY  2008/2009  ? cryotherapy  ? TONSILLECTOMY    ? UMBILICAL HERNIA REPAIR    ? ? ?Prior to Admission medications   ?Medication Sig Start Date End Date Taking? Authorizing Provider  ?amLODipine (NORVASC) 5 MG tablet Take 1 tablet by mouth once daily 06/28/21  Yes Bacigalupo, Dionne Bucy, MD  ?aspirin EC 81 MG tablet Take 81 mg by mouth daily. Swallow whole.   Yes [provider]  ?busPIRone (BUSPAR) 10 MG tablet Take 1 tablet (10 mg total) by mouth 2 (two) times daily. 07/26/21  Yes Bacigalupo, Dionne Bucy, MD  ?fluticasone (FLONASE) 50 MCG/ACT nasal spray Place 1 spray into both nostrils daily. 11/27/20  Yes  Bacigalupo, Dionne Bucy, MD  ?furosemide (LASIX) 40 MG tablet 1 tablet daily.   Yes [provider]  ?gabapentin (NEURONTIN) 100 MG capsule Take 1 capsule by mouth at bedtime 07/06/21  Yes Bacigalupo, Dionne Bucy, MD  ?Multiple Vitamin (MULTI-VITAMIN) tablet Take 1 tablet by mouth daily.    Yes [provider]  ?naproxen sodium (ALEVE) 220 MG tablet Take 1 tablet (220 mg total) by mouth daily as needed (pain/headache). 08/05/21  Yes Triplett, Dessa Phi, FNP  ?Polyethyl Glycol-Propyl Glycol (SYSTANE OP) Place 1 drop into both eyes daily as needed (dry eye).   Yes [provider]  ? ? ?Allergies as of 09/11/2021 - Review Complete 09/11/2021  ?Allergen Reaction Noted  ? Toviaz [fesoterodine fumarate er] Other (See Comments) 01/03/2015  ? ? ?Family History  ?Problem Relation Age of Onset  ? Cancer Mother   ? Stroke Father   ? Stroke Sister   ? Prostate cancer Brother   ? Kidney disease Neg Hx   ? Kidney cancer Neg Hx   ? Bladder Cancer Neg Hx   ? ? ?Social History  ? ?Socioeconomic History  ? Marital status: Married  ?  Spouse name: Not on file  ? Number of children: 2  ? Years of education: Not on file  ? Highest education level: Master's degree (  e.g., MA, MS, MEng, MEd, MSW, MBA)  ?Occupational History  ? Occupation: retired  ?Tobacco Use  ? Smoking status: Former  ?  Years: 10.00  ?  Types: Cigarettes  ?  Quit date: 07/08/1962  ?  Years since quitting: 59.2  ? Smokeless tobacco: Never  ? Tobacco comments:  ?  quit 1964  ?Vaping Use  ? Vaping Use: Never used  ?Substance and Sexual Activity  ? Alcohol use: Yes  ?  Alcohol/week: 1.0 - 2.0 standard drink  ?  Types: 1 Glasses of wine per week  ?  Comment: every 2-3 mths, 1 glass of wine daily    ? Drug use: No  ? Sexual activity: Not on file  ?Other Topics Concern  ? Not on file  ?Social History Narrative  ? Not on file  ? ?Social Determinants of Health  ? ?Financial Resource Strain: Low Risk   ? Difficulty of Paying Living Expenses: Not hard at all  ?Food  Insecurity: No Food Insecurity  ? Worried About Charity fundraiser in the Last Year: Never true  ? Ran Out of Food in the Last Year: Never true  ?Transportation Needs: No Transportation Needs  ? Lack of Transportation (Medical): No  ? Lack of Transportation (Non-Medical): No  ?Physical Activity: Insufficiently Active  ? Days of Exercise per Week: 4 days  ? Minutes of Exercise per Session: 30 min  ?Stress: Stress Concern Present  ? Feeling of Stress : To some extent  ?Social Connections: Moderately Isolated  ? Frequency of Communication with Friends and Family: More than three times a week  ? Frequency of Social Gatherings with Friends and Family: Once a week  ? Attends Religious Services: Never  ? Active Member of Clubs or Organizations: No  ? Attends Archivist Meetings: Never  ? Marital Status: Married  ?Intimate Partner Violence: Not At Risk  ? Fear of Current or Ex-Partner: No  ? Emotionally Abused: No  ? Physically Abused: No  ? Sexually Abused: No  ? ? ?Review of Systems: ?See HPI, otherwise negative ROS ? ?Physical Exam: ?BP (!) 160/106   Pulse 79   Temp (!) 95.8 ?F (35.4 ?C) (Temporal)   Resp (!) 21   Ht '5\' 9"'$  (1.753 m)   Wt 69.4 kg   SpO2 98%   BMI 22.59 kg/m?  ?General:   Alert,  pleasant and cooperative in NAD ?Head:  Normocephalic and atraumatic. ?Neck:  Supple; no masses or thyromegaly. ?Lungs:  Clear throughout to auscultation, normal respiratory effort.    ?Heart:  +S1, +S2, Regular rate and rhythm, No edema. ?Abdomen:  Soft, nontender and nondistended. Normal bowel sounds, without guarding, and without rebound.   ?Neurologic:  Alert and  oriented x4;  grossly normal neurologically. ? ?Impression/Plan: ?[name] is here for an endoscopy  to be performed for  evaluation of dysphagia ?   ?Risks, benefits, limitations, and alternatives regarding endoscopy have been reviewed with the patient.  Questions have been answered.  All parties agreeable. ? ? ?[name], MD  09/13/2021,  11:32 AM ? ?

## 2021-09-13 NOTE — Anesthesia Preprocedure Evaluation (Signed)
Anesthesia Evaluation  ?Patient identified by MRN, date of birth, ID band ?Patient awake ? ? ? ?Reviewed: ?Allergy & Precautions, NPO status , Patient's Chart, lab work & pertinent test results ? ?History of Anesthesia Complications ?Negative for: history of anesthetic complications ? ?Airway ?Mallampati: II ? ?TM Distance: >3 FB ?Neck ROM: full ? ? ? Dental ? ?(+) Dental Advidsory Given, Poor Dentition, Chipped ?  ?Pulmonary ?neg pulmonary ROS, former smoker,  ?  ?Pulmonary exam normal ? ? ? ? ? ? ? Cardiovascular ?hypertension, Pt. on medications ?(-) angina+ Peripheral Vascular Disease  ?(-) Past MI negative cardio ROS ?Normal cardiovascular exam(-) dysrhythmias + Valvular Problems/Murmurs MR and AS  ? ? ?  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 Neuromuscular disease CVA, Residual Symptoms negative psych ROS  ? GI/Hepatic ?negative GI ROS, Neg liver ROS, PUD, GERD  Controlled,  ?Endo/Other  ?negative endocrine ROS ? Renal/GU ?  ? ?  ?Musculoskeletal ? ? Abdominal ?Normal abdominal exam  (+)   ?Peds ? Hematology ?negative hematology ROS ?(+) Blood dyscrasia, anemia ,   ?Anesthesia Other Findings ?Past Medical History: ?No date: Anemia ?No date: Aortic valve sclerosis ?No date: Arthritis ?No date: Bladder outlet obstruction ?No date: Erectile dysfunction ?No date: GERD (gastroesophageal reflux disease) ?    Comment:  h/o ?No date: Hard of hearing ?No date: HBP (high blood pressure) ?No date: Heart murmur ?No date: History of kidney stones ?No date: History of nephrolithiasis ?No date: HLD (hyperlipidemia) ?No date: Incomplete bladder emptying ?No date: Mitral regurgitation ?No date: Neuropathy ?No date: Nocturia ?No date: Osteoporosis ?No date: Pre-diabetes ?No date: Prostate cancer [facility]) ?07/08/2012: Shingles ?No date: Sinus problem ?No date: Stomach ulcer ?07/08/2010: Stroke  [facility]) ?No date: Urinary frequency ?No date: Urinary leakage ? ?Past Surgical History: ?2000, 2001: CATARACT EXTRACTION ?07/08/1970: CHOLECYSTECTOMY ?2008/2009: PROSTATE SURGERY ?    Comment:  cryotherapy ?No date: TONSILLECTOMY ?No date: UMBILICAL HERNIA REPAIR ? ?BMI   ? Body Mass Index: 23.48 kg/m?  ?  ? ? Reproductive/Obstetrics ?negative OB ROS ? ?  ? ? ? ? ? ? ? ? ? ? ? ? ? ?  ?  ? ? ? ? ? ? ? ? ?Anesthesia Physical ? ?Anesthesia Plan ? ?ASA: 3 ? ?Anesthesia Plan: General  ? ?Post-op Pain Management:   ? ?Induction: Intravenous ? ?PONV Risk Score and Plan: Propofol infusion and TIVA ? ?Airway Management Planned: Natural Airway and Nasal Cannula ? ?Additional Equipment:  ? ?Intra-op Plan:  ? ?Post-operative Plan:  ? ?Informed Consent: I have reviewed the patients History and Physical, chart, labs and discussed the procedure including the risks, benefits and alternatives for the proposed anesthesia with the patient or authorized representative who has indicated his/her understanding and acceptance.  ? ? ? ?Dental Advisory Given ? ?Plan Discussed with: Anesthesiologist, CRNA and Surgeon ? ?Anesthesia Plan Comments: (Patient consented for risks of anesthesia including but not limited to:  ?- adverse reactions to medications ?- damage to eyes, teeth, lips or other oral mucosa ?- nerve damage due to positioning  ?- sore throat or hoarseness ?- Damage to heart, brain, nerves, lungs, other parts of body or loss of life ? ?Patient voiced understanding.)  ? ? ? ? ? ? ?Anesthesia Quick Evaluation ? ?

## 2021-09-14 DIAGNOSIS — R2681 Unsteadiness on feet: Secondary | ICD-10-CM | POA: Diagnosis not present

## 2021-09-14 DIAGNOSIS — R296 Repeated falls: Secondary | ICD-10-CM | POA: Diagnosis not present

## 2021-09-14 DIAGNOSIS — M6281 Muscle weakness (generalized): Secondary | ICD-10-CM | POA: Diagnosis not present

## 2021-09-19 ENCOUNTER — Encounter: Payer: Self-pay | Admitting: Emergency Medicine

## 2021-09-19 ENCOUNTER — Emergency Department: Payer: Medicare Other

## 2021-09-19 ENCOUNTER — Emergency Department
Admission: EM | Admit: 2021-09-19 | Discharge: 2021-09-20 | Disposition: A | Discharge status: Home / Self Care | Payer: Medicare Other | Attending: Emergency Medicine | Admitting: Emergency Medicine

## 2021-09-19 ENCOUNTER — Other Ambulatory Visit: Payer: Self-pay

## 2021-09-19 DIAGNOSIS — R55 Syncope and collapse: Secondary | ICD-10-CM | POA: Insufficient documentation

## 2021-09-19 DIAGNOSIS — R069 Unspecified abnormalities of breathing: Secondary | ICD-10-CM | POA: Diagnosis not present

## 2021-09-19 DIAGNOSIS — R0602 Shortness of breath: Secondary | ICD-10-CM | POA: Insufficient documentation

## 2021-09-19 DIAGNOSIS — R0689 Other abnormalities of breathing: Secondary | ICD-10-CM | POA: Diagnosis not present

## 2021-09-19 DIAGNOSIS — Z043 Encounter for examination and observation following other accident: Secondary | ICD-10-CM | POA: Diagnosis not present

## 2021-09-19 DIAGNOSIS — W19XXXA Unspecified fall, initial encounter: Secondary | ICD-10-CM | POA: Diagnosis not present

## 2021-09-19 LAB — CBC WITH DIFFERENTIAL/PLATELET
Abs Immature Granulocytes: 0.08 10*3/uL — ABNORMAL HIGH (ref 0.00–0.07)
Basophils Absolute: 0 10*3/uL (ref 0.0–0.1)
Basophils Relative: 0 %
Eosinophils Absolute: 0 10*3/uL (ref 0.0–0.5)
Eosinophils Relative: 0 %
HCT: 38.7 % — ABNORMAL LOW (ref 39.0–52.0)
Hemoglobin: 12.2 g/dL — ABNORMAL LOW (ref 13.0–17.0)
Immature Granulocytes: 1 %
Lymphocytes Relative: 3 %
Lymphs Abs: 0.3 10*3/uL — ABNORMAL LOW (ref 0.7–4.0)
MCH: 29.7 pg (ref 26.0–34.0)
MCHC: 31.5 g/dL (ref 30.0–36.0)
MCV: 94.2 fL (ref 80.0–100.0)
Monocytes Absolute: 1.2 10*3/uL — ABNORMAL HIGH (ref 0.1–1.0)
Monocytes Relative: 11 %
Neutro Abs: 9.9 10*3/uL — ABNORMAL HIGH (ref 1.7–7.7)
Neutrophils Relative %: 85 %
Platelets: 146 10*3/uL — ABNORMAL LOW (ref 150–400)
RBC: 4.11 MIL/uL — ABNORMAL LOW (ref 4.22–5.81)
RDW: 13.9 % (ref 11.5–15.5)
WBC: 11.5 10*3/uL — ABNORMAL HIGH (ref 4.0–10.5)
nRBC: 0 % (ref 0.0–0.2)

## 2021-09-19 LAB — TROPONIN I (HIGH SENSITIVITY)
Troponin I (High Sensitivity): 33 ng/L — ABNORMAL HIGH (ref <18)
Troponin I (High Sensitivity): 36 ng/L — ABNORMAL HIGH (ref <18)

## 2021-09-19 LAB — BASIC METABOLIC PANEL
Anion gap: 10 (ref 5–15)
BUN: 42 mg/dL — ABNORMAL HIGH (ref 8–23)
CO2: 29 mmol/L (ref 22–32)
Calcium: 9.7 mg/dL (ref 8.9–10.3)
Chloride: 95 mmol/L — ABNORMAL LOW (ref 98–111)
Creatinine, Ser: 1.25 mg/dL — ABNORMAL HIGH (ref 0.61–1.24)
GFR, Estimated: 53 mL/min — ABNORMAL LOW (ref >60)
Glucose, Bld: 218 mg/dL — ABNORMAL HIGH (ref 70–99)
Potassium: 4.3 mmol/L (ref 3.5–5.1)
Sodium: 134 mmol/L — ABNORMAL LOW (ref 135–145)

## 2021-09-19 MED ORDER — IOHEXOL 350 MG/ML SOLN
75.0000 mL | Freq: Once | INTRAVENOUS | Status: AC | PRN
  Administered 2021-09-19: 75 mL via INTRAVENOUS

## 2021-09-19 NOTE — ED Provider Notes (Incomplete)
? ?[facility] ?Provider Note ? ? ? Event Date/Time  ? First MD Initiated Contact with Patient 09/19/21 2123   ?  (approximate) ? ? ?History  ? ?Shortness of Breath and Loss of Consciousness ? ? ?HPI ? ?[name] is a 88 y.o. male  who, per GI note had endoscopy performed 6 days ago, who presents to the emergency department today because of concerns for shortness of breath as well as syncopal episodes.  Fortunately patient cannot give a great timeline for how long this has been going on.  He states he was having some shortness of breath prior to the endoscopy.  Sounds like it got worse since then.  He also feels like he has blacked out.  He denies any associated chest pain.  Denies any fevers.  States he did have a fall off of his bed although it does not sound like he suffered any significant traumatic injury from it. ? ?Physical Exam  ? ?Triage Vital Signs: ?ED Triage Vitals  ?Enc Vitals Group  ?   BP --   ?   Pulse Rate 09/19/21 2053 82  ?   Resp 09/19/21 2053 (!) 23  ?   Temp 09/19/21 2053 98.7 ?F (37.1 ?C)  ?   Temp Source 09/19/21 2053 Oral  ?   SpO2 09/19/21 2053 99 %  ?   Weight 09/19/21 2055 147 lb 3.2 oz (66.8 kg)  ?   Height 09/19/21 2055 '5\' 10"'$  (1.778 m)  ?   Head Circumference --   ?   Peak Flow --   ?   Pain Score 09/19/21 2054 0  ? ?Most recent vital signs: ?Vitals:  ? 09/19/21 2053  ?Pulse: 82  ?Resp: (!) 23  ?Temp: 98.7 ?F (37.1 ?C)  ?SpO2: 99%  ? ? ?General: Awake, no distress.  ?CV:  Good peripheral perfusion. Regular rate and rhythm. ?Resp:  Normal effort. Lungs clear ?Abd:  No distention. Non tender. ? ? ? ?ED Results / Procedures / Treatments  ? ?Labs ?(all labs ordered are listed, but only abnormal results are displayed) ?Labs Reviewed  ?CBC WITH DIFFERENTIAL/PLATELET - Abnormal; Notable for the following components:  ?    Result Value  ? WBC 11.5 (*)   ? RBC 4.11 (*)   ? Hemoglobin 12.2 (*)   ? HCT 38.7 (*)   ? Platelets 146 (*)   ? Neutro Abs 9.9 (*)   ? Lymphs  Abs 0.3 (*)   ? Monocytes Absolute 1.2 (*)   ? Abs Immature Granulocytes 0.08 (*)   ? All other components within normal limits  ?BASIC METABOLIC PANEL  ?URINALYSIS, COMPLETE (UACMP) WITH MICROSCOPIC  ?TROPONIN I (HIGH SENSITIVITY)  ? ? ? ?EKG ? ?INance Pear, attending physician, personally viewed and interpreted this EKG ? ?EKG Time: 2054 ?Rate: 81 ?Rhythm: sinus rhythm ?Axis: left axis deviation ?Intervals: qtc 448 ?QRS: LVH ?ST changes: no st elevation ?Impression: abnormal ekg ? ?RADIOLOGY ?I independently interpreted and visualized the cxr. My interpretation: No pneumonia. No pneumothorax. ?Radiology interpretation:  ?IMPRESSION:  ?No active disease.  ? ? ?{You MUST document your own interpretation of imaging, as well as the fact that you reviewed the radiologist's report!:1} ? ? ?PROCEDURES: ? ?Critical Care performed: {CriticalCareYesNo:19197::"Yes, see critical care procedure note(s)","No"} ? ?Procedures ? ? ?MEDICATIONS ORDERED IN ED: ?Medications - No data to display ? ? ?IMPRESSION / MDM / ASSESSMENT AND PLAN / ED COURSE  ?I reviewed the triage vital signs and the  nursing notes. ?             ?               ? ?Differential diagnosis includes, but is not limited to, *** ? ?{If the patient is on the monitor, remove the brackets and asterisks on the sentence below and remember to document it as a Procedure as well. Otherwise delete the sentence below:1} ?{**The patient is on the cardiac monitor to evaluate for evidence of arrhythmia and/or significant heart rate changes.**} ?{Remember to include, when applicable, any/all of the following data: ?independent review of imaging ?independent review of labs (comment specifically on pertinent positives and negatives) ?review of specific prior hospitalizations, PCP/specialist notes, etc. ?discuss meds given and prescribed ?document any discussion with consultants (including hospitalists) ?any clinical decision tools you used and why (PECARN, NEXUS,  etc.) ?did you consider admitting the patient? ?document social determinants of health affecting patient's care (homelessness, inability to follow up in a timely fashion, etc) ?document any pre-existing conditions increasing risk on current visit (e.g. diabetes and HTN increasing danger of high-risk chest pain/ACS) ?describes what meds you gave (especially parenteral) and why ?any other interventions?:1} ?  ? ? ?FINAL CLINICAL IMPRESSION(S) / ED DIAGNOSES  ? ?Final diagnoses:  ?None  ? ? ? ?Rx / DC Orders  ? ?ED Discharge Orders   ? ? None  ? ?  ? ? ? ?Note:  This document was prepared using Dragon voice recognition software and may include unintentional dictation errors. ? ?

## 2021-09-19 NOTE — Discharge Instructions (Signed)
Please seek medical attention for any high fevers, chest pain, shortness of breath, change in behavior, persistent vomiting, bloody stool or any other new or concerning symptoms.  

## 2021-09-19 NOTE — ED Triage Notes (Signed)
Pt to ED via [facility] from home for SOB and syncopal episodes. Pt had fall yesterday after him sliding out of bed and ever since the fall pt has been shortness of breath and had multiple syncopal episodes. Pt states he hit his head during fall. Denies blood thinner use. ?Pt had throat surgery 2 days ago due to a " nut being stuck in my throat", pt has been on liquid diet.   ? ?Pt is A&Ox4.  ?

## 2021-09-20 ENCOUNTER — Telehealth: Payer: Self-pay | Admitting: Family Medicine

## 2021-09-20 NOTE — Telephone Encounter (Signed)
Spoke with patient's wife and patient's daughter on 3 way call and offered the patient an appointment with Dr. Jacinto Reap for 09/21/2021 at [facility].  The pt's daughter and wife stated that all the patient needs is a swallow study as recommended by the ED and if Dr. Jacinto Reap couldn't do that there was no need to come to see her.  I spoke with Dr. Jacinto Reap and she recommended that they call GI and schedule with them since she has already referred him and is an established pt .  I let them know if they have any trouble getting this scheduled to call us back and Dr. B will put in a referral.

## 2021-09-20 NOTE — Telephone Encounter (Signed)
Pts daughter stated the referral sent in does not offer the modified test the pt needs, pt was seeing if a referral to radiology could be sent in instead.  ?

## 2021-09-20 NOTE — Telephone Encounter (Signed)
Pts daughter called back and requested to speak with Anderson Malta, please advise.  ?

## 2021-09-20 NOTE — ED Provider Notes (Signed)
? ?[facility] ?Provider Note ? ? ? Event Date/Time  ? First MD Initiated Contact with Patient 09/19/21 2123   ?  (approximate) ? ? ?History  ? ?Shortness of Breath and Loss of Consciousness ? ? ?HPI ? ?[name] is a 88 y.o. male  who, per GI note had endoscopy performed 6 days ago, who presents to the emergency department today because of concerns for shortness of breath as well as syncopal episodes.  Fortunately patient cannot give a great timeline for how long this has been going on.  He states he was having some shortness of breath prior to the endoscopy.  Sounds like it got worse since then.  He also feels like he has blacked out.  He denies any associated chest pain.  Denies any fevers.  States he did have a fall off of his bed although it does not sound like he suffered any significant traumatic injury from it. ? ?Physical Exam  ? ?Triage Vital Signs: ?ED Triage Vitals  ?Enc Vitals Group  ?   BP --   ?   Pulse Rate 09/19/21 2053 82  ?   Resp 09/19/21 2053 (!) 23  ?   Temp 09/19/21 2053 98.7 ?F (37.1 ?C)  ?   Temp Source 09/19/21 2053 Oral  ?   SpO2 09/19/21 2053 99 %  ?   Weight 09/19/21 2055 147 lb 3.2 oz (66.8 kg)  ?   Height 09/19/21 2055 '5\' 10"'$  (1.778 m)  ?   Head Circumference --   ?   Peak Flow --   ?   Pain Score 09/19/21 2054 0  ? ?Most recent vital signs: ?Vitals:  ? 09/19/21 2200 09/19/21 2330  ?BP: 134/84 135/66  ?Pulse: 79 74  ?Resp: (!) 21 (!) 23  ?Temp:    ?SpO2: 95% 94%  ? ? ?General: Awake, no distress.  ?CV:  Good peripheral perfusion. Regular rate and rhythm. ?Resp:  Normal effort. Lungs clear ?Abd:  No distention. Non tender. ? ? ? ?ED Results / Procedures / Treatments  ? ?Labs ?(all labs ordered are listed, but only abnormal results are displayed) ?Labs Reviewed  ?CBC WITH DIFFERENTIAL/PLATELET - Abnormal; Notable for the following components:  ?    Result Value  ? WBC 11.5 (*)   ? RBC 4.11 (*)   ? Hemoglobin 12.2 (*)   ? HCT 38.7 (*)   ? Platelets 146 (*)   ?  Neutro Abs 9.9 (*)   ? Lymphs Abs 0.3 (*)   ? Monocytes Absolute 1.2 (*)   ? Abs Immature Granulocytes 0.08 (*)   ? All other components within normal limits  ?BASIC METABOLIC PANEL - Abnormal; Notable for the following components:  ? Sodium 134 (*)   ? Chloride 95 (*)   ? Glucose, Bld 218 (*)   ? BUN 42 (*)   ? Creatinine, Ser 1.25 (*)   ? GFR, Estimated 53 (*)   ? All other components within normal limits  ?TROPONIN I (HIGH SENSITIVITY) - Abnormal; Notable for the following components:  ? Troponin I (High Sensitivity) 33 (*)   ? All other components within normal limits  ?TROPONIN I (HIGH SENSITIVITY) - Abnormal; Notable for the following components:  ? Troponin I (High Sensitivity) 36 (*)   ? All other components within normal limits  ?URINALYSIS, COMPLETE (UACMP) WITH MICROSCOPIC  ? ? ? ?EKG ? ?INance Pear, attending physician, personally viewed and interpreted this EKG ? ?EKG Time: 2054 ?  Rate: 81 ?Rhythm: sinus rhythm ?Axis: left axis deviation ?Intervals: qtc 448 ?QRS: LVH ?ST changes: no st elevation ?Impression: abnormal ekg ? ?RADIOLOGY ?I independently interpreted and visualized the cxr. My interpretation: No pneumonia. No pneumothorax. ?Radiology interpretation:  ?IMPRESSION:  ?No active disease.  ? ?CT angio PE ?  ?IMPRESSION:  ?1. No evidence of significant pulmonary embolus.  ?2. No evidence of active pulmonary disease.  ?3. Aortic atherosclerosis.  ?4. Motion artifact limits examination.  ? ?CT head ?   ?IMPRESSION:  ?No acute intracranial abnormalities. Chronic atrophy and small  ?vessel ischemic changes.  ? ? ?PROCEDURES: ? ?Critical Care performed: No ? ?Procedures ? ? ?MEDICATIONS ORDERED IN ED: ?Medications  ?iohexol (OMNIPAQUE) 350 MG/ML injection 75 mL (75 mLs Intravenous Contrast Given 09/19/21 2224)  ? ? ? ?IMPRESSION / MDM / ASSESSMENT AND PLAN / ED COURSE  ?I reviewed the triage vital signs and the nursing notes. ?             ?               ? ?Differential diagnosis includes, but is  not limited to, ACS, arrhythmias, anemia, electrolyte abnormality, pneumonia, PE. ? ?Patient presented to the emergency department today because of concerns for shortness of breath and syncopal episodes.  It is somewhat hard to decipher the true timeline of his symptoms.  However on my exam patient does not appear to be in any respiratory distress.  He is awake and alert.  Broad work-up was initiated.  This included CT scans of the head and chest to evaluate for possible intracranial process or PE.  No concerning findings in either the head or the chest.  Blood work here with hemoglobin of 12.2.  While this is slightly anemic I do not think it would be so significant to cause the patient to pass out.  Electrolytes were with very mild hyponatremia and hypochloremia but no significant electrolyte abnormalities.  Patient did mention that he had noticed some blood in his stool however patient was guaiac negative today.   At this time somewhat unclear etiology of the patient's symptoms.  I did discuss with the patient and did offer that he stay in the hospital overnight for further cardiac monitoring.  However this time he felt comfortable going home.  I think this is reasonable given reassuring work-up.  I did encourage patient to have close follow-up with outpatient physician. ? ?FINAL CLINICAL IMPRESSION(S) / ED DIAGNOSES  ? ?Final diagnoses:  ?Shortness of breath  ?Syncope, unspecified syncope type  ? ? ? ?Rx / DC Orders  ? ?ED Discharge Orders   ? ? None  ? ?  ? ? ? ?Note:  This document was prepared using Dragon voice recognition software and may include unintentional dictation errors. ? ?  ?Nance Pear, MD ?09/20/21 0018 ? ?

## 2021-09-20 NOTE — Addendum Note (Signed)
Addended by: [name] on: 09/20/2021 02:38 PM ? ? Modules accepted: Orders ? ?

## 2021-09-20 NOTE — Telephone Encounter (Signed)
Pt's daughter states that she has called GI three times and has not received a call back or anything and would like to see if you will put in an order for a swallow study.

## 2021-09-20 NOTE — Telephone Encounter (Signed)
Patient daughter called back to speak to Anderson Malta stated that she have been calling the office for Dr Vicente Males but have not gotten any answer but when she called the third time she got a recording that stated that the office is closed and she need to know what to do. Asking for a call back from [place] at Ph# [phone] ?

## 2021-09-20 NOTE — Progress Notes (Deleted)
?  ? ? ?  Established patient visit ? ? ?Patient: [name]   DOB: Nov 19, 1924   88 y.o. Male  MRN: [id number] ?Visit Date: 09/21/2021 ? ?Today's healthcare provider: Lavon Paganini, MD  ? ?No chief complaint on file. ? ?Subjective  ?  ?HPI  ?Follow up ER visit ? ?Patient was seen in ER for Shortness of breath and syncope, unspecified 6 days after having an Endoscopy on 03/15 to 3/16 (3 hrs). ?Treatment for this included labs, iv fluid and ct scans of head and chest. ?He reports {DESC; EXCELLENT/GOOD/FAIR:19992} compliance with treatment. ?He reports this condition is {improved/worse/unchanged:3041574}. ? ?-----------------------------------------------------------------------------------------  ? ?Medications: ?Outpatient Medications Prior to Visit  ?Medication Sig  ? amLODipine (NORVASC) 5 MG tablet Take 1 tablet by mouth once daily  ? aspirin EC 81 MG tablet Take 81 mg by mouth daily. Swallow whole.  ? busPIRone (BUSPAR) 10 MG tablet Take 1 tablet (10 mg total) by mouth 2 (two) times daily.  ? fluticasone (FLONASE) 50 MCG/ACT nasal spray Place 1 spray into both nostrils daily.  ? furosemide (LASIX) 40 MG tablet 1 tablet daily.  ? gabapentin (NEURONTIN) 100 MG capsule Take 1 capsule by mouth at bedtime  ? Multiple Vitamin (MULTI-VITAMIN) tablet Take 1 tablet by mouth daily.   ? naproxen sodium (ALEVE) 220 MG tablet Take 1 tablet (220 mg total) by mouth daily as needed (pain/headache).  ? Polyethyl Glycol-Propyl Glycol (SYSTANE OP) Place 1 drop into both eyes daily as needed (dry eye).  ? ?No facility-administered medications prior to visit.  ? ? ?Review of Systems ? ?{Labs  Heme  Chem  Endocrine  Serology  Results Review (optional):23779} ?  Objective  ?  ?There were no vitals taken for this visit. ?{Show previous vital signs (optional):23777} ? ?Physical Exam  ?*** ? ?No results found for any visits on 09/21/21. ? Assessment & Plan  ?  ? ?*** ? ?No follow-ups on file.  ?   ? ?{provider  attestation***:1} ? ? ?Lavon Paganini, MD  ?[facility] ?[phone] (phone) ?[phone] (fax) ? ?[facility]  ?

## 2021-09-20 NOTE — Telephone Encounter (Signed)
Patient was seen in the hospital on 09/19/2021 for shortness of breath, loosing weight and unable to swallow. ED advised patient to follow up with PCP as soon as possible. ? ?Requesting a phone call prior to 3:40pm today due to patient spouse having a MRI today. Caller does not check her VM so please make more the one attempt or document if patient can be worked in today or tomorrow to see PCP.  ? ?Caller would like to discuss a swallowing test, patient losing weight and ED visit.  ?

## 2021-09-21 ENCOUNTER — Other Ambulatory Visit: Payer: Self-pay

## 2021-09-21 ENCOUNTER — Ambulatory Visit: Payer: Medicare Other | Admitting: Family Medicine

## 2021-09-21 DIAGNOSIS — M6281 Muscle weakness (generalized): Secondary | ICD-10-CM | POA: Diagnosis not present

## 2021-09-21 DIAGNOSIS — R2681 Unsteadiness on feet: Secondary | ICD-10-CM | POA: Diagnosis not present

## 2021-09-21 DIAGNOSIS — R296 Repeated falls: Secondary | ICD-10-CM | POA: Diagnosis not present

## 2021-09-21 DIAGNOSIS — R1310 Dysphagia, unspecified: Secondary | ICD-10-CM

## 2021-09-21 NOTE — Telephone Encounter (Signed)
Pts daughter called in requesting if the referral for the swallowing study has been sent over to a Radiologist. Please advise ?

## 2021-09-21 NOTE — Telephone Encounter (Signed)
I believe order was placed by [facility]

## 2021-09-25 DIAGNOSIS — R2681 Unsteadiness on feet: Secondary | ICD-10-CM | POA: Diagnosis not present

## 2021-09-25 DIAGNOSIS — M6281 Muscle weakness (generalized): Secondary | ICD-10-CM | POA: Diagnosis not present

## 2021-09-25 DIAGNOSIS — R296 Repeated falls: Secondary | ICD-10-CM | POA: Diagnosis not present

## 2021-09-28 DIAGNOSIS — R296 Repeated falls: Secondary | ICD-10-CM | POA: Diagnosis not present

## 2021-09-28 DIAGNOSIS — M6281 Muscle weakness (generalized): Secondary | ICD-10-CM | POA: Diagnosis not present

## 2021-09-28 DIAGNOSIS — R2681 Unsteadiness on feet: Secondary | ICD-10-CM | POA: Diagnosis not present

## 2021-10-01 ENCOUNTER — Telehealth: Payer: Self-pay | Admitting: Family Medicine

## 2021-10-01 ENCOUNTER — Ambulatory Visit
Admission: RE | Admit: 2021-10-01 | Discharge: 2021-10-01 | Disposition: A | Discharge status: Home / Self Care | Payer: Medicare Other | Source: Ambulatory Visit | Attending: Family Medicine | Admitting: Family Medicine

## 2021-10-01 ENCOUNTER — Telehealth: Payer: Self-pay | Admitting: Gastroenterology

## 2021-10-01 DIAGNOSIS — R1310 Dysphagia, unspecified: Secondary | ICD-10-CM | POA: Insufficient documentation

## 2021-10-01 DIAGNOSIS — K224 Dyskinesia of esophagus: Secondary | ICD-10-CM | POA: Diagnosis not present

## 2021-10-01 DIAGNOSIS — K449 Diaphragmatic hernia without obstruction or gangrene: Secondary | ICD-10-CM | POA: Diagnosis not present

## 2021-10-01 NOTE — Telephone Encounter (Signed)
Copied from [place] [id number]. Topic: General - Other ?>> Oct 01, 2021  9:42 AM Leward Quan A wrote: ?Reason for CRM: Patient daughter Brett Fairy called in to inform Dr B that they are at the hospital having a barium swallow test and to inquire of Dr B about changing his medications into liquid form since he have been having a hard time swallowing the pills and have been chewing them up to swallow them. Asking for a call back to discuss results of swallow test and to discuss the medication changes please call Ph# [phone] ?

## 2021-10-01 NOTE — Telephone Encounter (Signed)
Alex,  Can you advise on what meds can be crushed vs changed to liquid formulation from his med list please? ? ?CMAs, we will get back to them after pharmacist review. ?

## 2021-10-01 NOTE — Telephone Encounter (Signed)
Pt's wife states someone called the home and they were unable to reach the phone. ?However, I do see the message about results from test today and transferred to  ?

## 2021-10-01 NOTE — Telephone Encounter (Signed)
Dr. Vicente Males, would you be okay if I could schedule patient for a MyChart video visit? If they can't then, I will schedule the appointment for him to come in. Please advise. ?

## 2021-10-01 NOTE — Telephone Encounter (Signed)
Patients daughter called and stated that patient had a EGD on 09/13/2021. Patient went to ED on 09/19/2021 because he aspirated and had trouble swallowing. Daughter states that he went to his PCP and she recommended he come see Dr Vicente Males.   ? ?Call [phone] (patient) first. If patient does not answer call daughter, Kyen Taite. ?

## 2021-10-01 NOTE — Telephone Encounter (Signed)
Result review read to pt's daughter Christen Butter understanding.  ?

## 2021-10-02 ENCOUNTER — Encounter: Payer: Self-pay | Admitting: Family Medicine

## 2021-10-02 DIAGNOSIS — R2681 Unsteadiness on feet: Secondary | ICD-10-CM | POA: Diagnosis not present

## 2021-10-02 DIAGNOSIS — R296 Repeated falls: Secondary | ICD-10-CM | POA: Diagnosis not present

## 2021-10-02 DIAGNOSIS — M6281 Muscle weakness (generalized): Secondary | ICD-10-CM | POA: Diagnosis not present

## 2021-10-02 NOTE — Telephone Encounter (Signed)
[name] advised as below.  ?

## 2021-10-02 NOTE — Telephone Encounter (Signed)
All of Mr. [place]ardee medications can be crushed except for the aspirin, but they could switch to the chewable aspirin 81 mg tablets and crush those.  ? ?Junius Argyle, PharmD, BCACP, CPP  ?Clinical Pharmacist Practitioner  ?[place] ?[phone] ? ?

## 2021-10-02 NOTE — Telephone Encounter (Signed)
Please let family know above recommendation from pharmacy.   ? ?Thanks Alex!

## 2021-10-03 ENCOUNTER — Other Ambulatory Visit: Payer: Self-pay

## 2021-10-03 MED ORDER — FUROSEMIDE 20 MG PO TABS: 60.0000 mg | ORAL_TABLET | Freq: Every day | ORAL | 0 refills | Status: DC

## 2021-10-03 NOTE — Telephone Encounter (Signed)
Please advise patient of the following:  ? ?Although Gabapentin tablets should not be crushed, gabapentin capsules can be opened and sprinkled in applesauce, orange juice, or pudding. This will not have any significant impact on absorption. ? ?Source: Gabapentin absorption: effect of mixing with foods of varying macronutrient composition ?http://www.robinson-herrera.net/ ? ?Thanks, ?Junius Argyle, PharmD, BCACP, CPP  ?Clinical Pharmacist Practitioner  ?[place] ?[phone]  ?

## 2021-10-03 NOTE — Telephone Encounter (Signed)
[name] confirmed that they have gabapentin in capsule. They have scheduled an office visit for Monday to go over medications.  ?

## 2021-10-03 NOTE — Telephone Encounter (Signed)
Called patient but left him a voicemail letting him know that we will schedule a video visit with Dr. Vicente Males tomorrow (10/04/2021 at 2:15 PM). I will also notify his daughter. ?

## 2021-10-04 ENCOUNTER — Telehealth: Payer: Federal, State, Local not specified - PPO | Admitting: Gastroenterology

## 2021-10-04 NOTE — Progress Notes (Incomplete)
?  ?[name] , MD ?[address]  ?[address]  ?[place], [place] [address]  ?Main: [phone]  ?Fax: [phone] ? ? ?Primary Care Physician: [name], MD ? ?Virtual Visit via Video Note ? ?I connected with patient on 10/04/21 at  2:15 PM EDT by video and verified that I am speaking with the correct person using two identifiers. ?  ?I discussed the limitations, risks, security and privacy concerns of performing an evaluation and management service by video  and the availability of in person appointments. I also discussed with the patient that there may be a patient responsible charge related to this service. The patient expressed understanding and agreed to proceed. ? ?Location of Patient: Home ?Location of Provider: Home ?Persons involved: Patient and provider only ? ? ?History of Present Illness: ?No chief complaint on file. ? ? ?HPI: [name] is a 88 y.o. male ? ?Summary of history : ? ?Initially referred and seen on 09/11/2021 for dysphagia.  He presented to the ER on 09/04/2021 with difficulty swallowing after she ate some nuts.  He felt like large pieces of walnut were stuck in his throat.  A CT scan of the neck was obtained but no foreign body was seen and the patient was discharged.  The patient is subsequently seen by Dr. Brita Romp on 09/06/2021. He had persistent symptoms and hence was referred to see [place]. ?  ? ?Interval history   09/11/2021-10/04/2021 ?09/13/2021: EGD: Benign stenosis seen at the GE junction dilated to 18 mm ?09/19/2021: Presented to the emergency room with shortness of breath underwent a CT angiogram chest pulmonary embolism protocol showed no significant pulmonary embolus. ?10/01/2021: Barium swallow showed hiatal hernia with a prominent ring barium tablet had delayed passage at the level of the GE junction. ? ? ? ?Current Outpatient Medications  ?Medication Sig Dispense Refill  ? amLODipine (NORVASC) 5 MG tablet Take 1 tablet by mouth once daily 90 tablet 0  ? aspirin  EC 81 MG tablet Take 81 mg by mouth daily. Swallow whole.    ? busPIRone (BUSPAR) 10 MG tablet Take 1 tablet (10 mg total) by mouth 2 (two) times daily. 180 tablet 1  ? fluticasone (FLONASE) 50 MCG/ACT nasal spray Place 1 spray into both nostrils daily. 16 g 5  ? furosemide (LASIX) 20 MG tablet Take 3 tablets (60 mg total) by mouth daily. 270 tablet 0  ? gabapentin (NEURONTIN) 100 MG capsule Take 1 capsule by mouth at bedtime 90 capsule 0  ? Multiple Vitamin (MULTI-VITAMIN) tablet Take 1 tablet by mouth daily.     ? naproxen sodium (ALEVE) 220 MG tablet Take 1 tablet (220 mg total) by mouth daily as needed (pain/headache). 60 tablet 0  ? Polyethyl Glycol-Propyl Glycol (SYSTANE OP) Place 1 drop into both eyes daily as needed (dry eye).    ? ?No current facility-administered medications for this visit.  ? ? ?Allergies as of 10/04/2021 - Review Complete 09/19/2021  ?Allergen Reaction Noted  ? Toviaz [fesoterodine fumarate er] Other (See Comments) 01/03/2015  ? ? ?Review of Systems:    ?All systems reviewed and negative except where noted in HPI.  ?General Appearance:    Alert, cooperative, no distress, appears stated age  ?Head:    Normocephalic, without obvious abnormality, atraumatic  ?Eyes:    PERRL, conjunctiva/corneas clear,  ?Ears:    Grossly normal hearing   ? ?Neurologic:  Grossly normal   ? ?Observations/Objective: ? ?Labs: ?CMP  ?   ?Component Value Date/Time  ?  NA 134 (L) 09/19/2021 2057  ? NA 143 06/22/2021 1307  ? NA 142 04/25/2012 1258  ? K 4.3 09/19/2021 2057  ? K 4.2 04/25/2012 1258  ? CL 95 (L) 09/19/2021 2057  ? CL 107 04/25/2012 1258  ? CO2 29 09/19/2021 2057  ? CO2 27 04/25/2012 1258  ? GLUCOSE 218 (H) 09/19/2021 2057  ? GLUCOSE 101 (H) 04/25/2012 1258  ? BUN 42 (H) 09/19/2021 2057  ? BUN 28 06/22/2021 1307  ? BUN 27 (H) 04/25/2012 1258  ? CREATININE 1.25 (H) 09/19/2021 2057  ? CREATININE 1.00 04/25/2012 1258  ? CALCIUM 9.7 09/19/2021 2057  ? CALCIUM 9.4 04/25/2012 1258  ? PROT 6.4 (L) 08/07/2021  2033  ? PROT 6.7 06/22/2021 1307  ? ALBUMIN 3.6 08/07/2021 2033  ? ALBUMIN 4.3 06/22/2021 1307  ? AST 35 08/07/2021 2033  ? ALT 20 08/07/2021 2033  ? ALKPHOS 63 08/07/2021 2033  ? BILITOT 0.6 08/07/2021 2033  ? BILITOT 0.5 06/22/2021 1307  ? GFRNONAA 53 (L) 09/19/2021 2057  ? GFRNONAA >60 04/25/2012 1258  ? GFRAA 71 06/27/2020 1045  ? GFRAA >60 04/25/2012 1258  ? ?Lab Results  ?Component Value Date  ? WBC 11.5 (H) 09/19/2021  ? HGB 12.2 (L) 09/19/2021  ? HCT 38.7 (L) 09/19/2021  ? MCV 94.2 09/19/2021  ? PLT 146 (L) 09/19/2021  ? ? ?Imaging Studies: ?CT Head Wo Contrast ? ?Result Date: 09/19/2021 ?CLINICAL DATA:  Syncope with a fall. Shortness of breath and syncopal episodes. Fell yesterday. EXAM: CT HEAD WITHOUT CONTRAST TECHNIQUE: Contiguous axial images were obtained from the base of the skull through the vertex without intravenous contrast. RADIATION DOSE REDUCTION: This exam was performed according to the departmental dose-optimization program which includes automated exposure control, adjustment of the mA and/or kV according to patient size and/or use of iterative reconstruction technique. COMPARISON:  04/11/2018 FINDINGS: Brain: Diffuse cerebral atrophy. Ventricular dilatation consistent with central atrophy. Low-attenuation changes in the deep white matter consistent with small vessel ischemia. No abnormal extra-axial fluid collections. No mass effect or midline shift. Gray-white matter junctions are distinct. Basal cisterns are not effaced. No acute intracranial hemorrhage. Vascular: Moderate intracranial arterial vascular calcifications. Skull: Calvarium appears intact. Sinuses/Orbits: Small retention cyst in the right maxillary antrum. Paranasal sinuses and mastoid air cells are otherwise clear. Other: None. IMPRESSION: No acute intracranial abnormalities. Chronic atrophy and small vessel ischemic changes. Electronically Signed   By: Lucienne Capers M.D.   On: 09/19/2021 23:13  ? ?CT Soft Tissue Neck Wo  Contrast ? ?Result Date: 09/04/2021 ?CLINICAL DATA:  Swallowed walnut.  Feels foreign body in throat. EXAM: CT NECK WITHOUT CONTRAST TECHNIQUE: Multidetector CT imaging of the neck was performed following the standard protocol without intravenous contrast. RADIATION DOSE REDUCTION: This exam was performed according to the departmental dose-optimization program which includes automated exposure control, adjustment of the mA and/or kV according to patient size and/or use of iterative reconstruction technique. COMPARISON:  None. FINDINGS: Pharynx and larynx: Normal. No mass or swelling. No radiopaque foreign body in the airway or pharynx. Salivary glands: No inflammation, mass, or stone. Thyroid: Negative Lymph nodes: No enlarged lymph nodes in the neck. Vascular: Limited vascular evaluation without intravenous contrast. Mild atherosclerotic calcification the carotid bifurcation bilaterally. Atherosclerotic calcification aortic arch. Limited intracranial: Generalized atrophy.  No acute abnormality. Visualized orbits: No orbital lesion.  Bilateral cataract extraction Mastoids and visualized paranasal sinuses: Negative Skeleton: Cervical spondylosis.  No acute skeletal abnormality. Upper chest: Lung apices clear bilaterally Other: None IMPRESSION: No  radiopaque foreign body is seen in the airway No mass or adenopathy in the neck. Electronically Signed   By: Franchot Gallo M.D.   On: 09/04/2021 15:12  ? ?CT Angio Chest PE W and/or Wo Contrast ? ?Result Date: 09/19/2021 ?CLINICAL DATA:  Shortness of breath and syncope.  Fell yesterday. EXAM: CT ANGIOGRAPHY CHEST WITH CONTRAST TECHNIQUE: Multidetector CT imaging of the chest was performed using the standard protocol during bolus administration of intravenous contrast. Multiplanar CT image reconstructions and MIPs were obtained to evaluate the vascular anatomy. RADIATION DOSE REDUCTION: This exam was performed according to the departmental dose-optimization program which  includes automated exposure control, adjustment of the mA and/or kV according to patient size and/or use of iterative reconstruction technique. CONTRAST:  8m OMNIPAQUE IOHEXOL 350 MG/ML SOLN COMPARISON:  02/1

## 2021-10-05 DIAGNOSIS — R296 Repeated falls: Secondary | ICD-10-CM | POA: Diagnosis not present

## 2021-10-05 DIAGNOSIS — R2681 Unsteadiness on feet: Secondary | ICD-10-CM | POA: Diagnosis not present

## 2021-10-05 DIAGNOSIS — M6281 Muscle weakness (generalized): Secondary | ICD-10-CM | POA: Diagnosis not present

## 2021-10-05 DIAGNOSIS — H43813 Vitreous degeneration, bilateral: Secondary | ICD-10-CM | POA: Diagnosis not present

## 2021-10-08 ENCOUNTER — Encounter: Payer: Self-pay | Admitting: Family Medicine

## 2021-10-08 ENCOUNTER — Ambulatory Visit (INDEPENDENT_AMBULATORY_CARE_PROVIDER_SITE_OTHER): Payer: Medicare Other | Admitting: Family Medicine

## 2021-10-08 VITALS — BP 140/70 | HR 88 | Temp 98.2°F | Resp 16 | Wt 147.0 lb

## 2021-10-08 DIAGNOSIS — Z789 Other specified health status: Secondary | ICD-10-CM | POA: Diagnosis not present

## 2021-10-08 DIAGNOSIS — R1310 Dysphagia, unspecified: Secondary | ICD-10-CM

## 2021-10-08 MED ORDER — FUROSEMIDE 10 MG/ML PO SOLN: 60.0000 mg | Freq: Every day | ORAL | 12 refills | Status: DC

## 2021-10-08 NOTE — Assessment & Plan Note (Addendum)
Ongoing despite EGD with esophageal stretching as much as possible ?Swallow study showed esophageal dysmotility and Small hiatal hernia and associated Schatzki's ring which ?obstructs passage of the 13 mm barium tablet. ?He is on liquid diet now and crushing all tabs per Pharmacy ?Will initiate speech therapy at [facility] ?They have worked with a Automotive engineer at [employer] for [place] ?Weight loss is stabilized now.  ?Continue to crush meds, except will switch furosemide to liquid suspension ?

## 2021-10-08 NOTE — Assessment & Plan Note (Signed)
Relying on wife more for ADLs now ?Will set up with occupational therapy at [facility] ?

## 2021-10-08 NOTE — Progress Notes (Signed)
?  ? ?I,Joseline E Rosas,acting as a scribe for Lavon Paganini, MD.,have documented all relevant documentation on the behalf of Lavon Paganini, MD,as directed by  Lavon Paganini, MD while in the presence of Lavon Paganini, MD.  ? ?Established patient visit ? ? ?Patient: [name]   DOB: October 11, 1924   88 y.o. Male  MRN: [id number] ?Visit Date: 10/08/2021 ? ?Today's healthcare provider: Lavon Paganini, MD  ? ?Chief Complaint  ?Patient presents with  ? Follow-up  ? ?Subjective  ?  ?HPI  ?Patient here to follow up on problems with swallowing.  Weight loss has stabilized. ? ?Tucking chin can help with swallowing ? ?GI stretched esophagus as much as possible with EGD. ? ?Using 3 scoops of protein powder BID ?Using a green supplement ? ?Medications: ?Outpatient Medications Prior to Visit  ?Medication Sig  ? amLODipine (NORVASC) 5 MG tablet Take 1 tablet by mouth once daily  ? aspirin EC 81 MG tablet Take 81 mg by mouth daily. Swallow whole.  ? busPIRone (BUSPAR) 10 MG tablet Take 1 tablet (10 mg total) by mouth 2 (two) times daily.  ? fluticasone (FLONASE) 50 MCG/ACT nasal spray Place 1 spray into both nostrils daily.  ? naproxen sodium (ALEVE) 220 MG tablet Take 1 tablet (220 mg total) by mouth daily as needed (pain/headache).  ? Polyethyl Glycol-Propyl Glycol (SYSTANE OP) Place 1 drop into both eyes daily as needed (dry eye).  ? [DISCONTINUED] furosemide (LASIX) 20 MG tablet Take 3 tablets (60 mg total) by mouth daily.  ? [DISCONTINUED] gabapentin (NEURONTIN) 100 MG capsule Take 1 capsule by mouth at bedtime (Patient not taking: Reported on 10/08/2021)  ? [DISCONTINUED] Multiple Vitamin (MULTI-VITAMIN) tablet Take 1 tablet by mouth daily.  (Patient not taking: Reported on 10/08/2021)  ? ?No facility-administered medications prior to visit.  ? ? ?Review of Systems per HPI ? ? ?  Objective  ?  ?BP 140/70 (BP Location: Left Arm, Patient Position: Sitting, Cuff Size: Normal)   Pulse 88   Temp 98.2 ?F (36.8  ?C) (Temporal)   Resp 16   Wt 147 lb (66.7 kg)   SpO2 98%   BMI 21.09 kg/m?  ? ? ?Physical Exam ?Constitutional:   ?   General: He is not in acute distress. ?   Appearance: Normal appearance. He is not diaphoretic.  ?HENT:  ?   Head: Normocephalic.  ?Eyes:  ?   Conjunctiva/sclera: Conjunctivae normal.  ?Pulmonary:  ?   Effort: Pulmonary effort is normal. No respiratory distress.  ?Neurological:  ?   Mental Status: He is alert. Mental status is at baseline.  ?   Gait: Gait abnormal.  ?  ? ? ? ?No results found for any visits on 10/08/21. ? Assessment & Plan  ?  ? ?Problem List Items Addressed This Visit   ? ?  ? Digestive  ? Dysphagia - Primary  ?  Ongoing despite EGD with esophageal stretching as much as possible ?Swallow study showed esophageal dysmotility and Small hiatal hernia and associated Schatzki's ring which ?obstructs passage of the 13 mm barium tablet. ?He is on liquid diet now and crushing all tabs per Pharmacy ?Will initiate speech therapy at [facility] ?They have worked with a Automotive engineer at [employer] for [place] ?Weight loss is stabilized now.  ?Continue to crush meds, except will switch furosemide to liquid suspension ?  ?  ? Relevant Orders  ? Ambulatory referral to Speech Therapy  ?  ? Other  ? Decreased activities  of daily living (ADL)  ?  Relying on wife more for ADLs now ?Will set up with occupational therapy at [facility] ?  ?  ? Relevant Orders  ? Ambulatory referral to Occupational Therapy  ?  ? ?No follow-ups on file.  ?   ? ?Total time spent on today's visit was greater than 40 minutes, including both face-to-face time and nonface-to-face time personally spent on review of chart, discussing goals, treatment options, referrals, speaking to family members, answering patient's questions, and coordinating care. ? ?I, Lavon Paganini, MD, have reviewed all documentation for this visit. The documentation on 10/08/21 for the exam, diagnosis, procedures, and orders are all  accurate and complete. ? ? ?[name], MD, MPH ?[place] ?[facility]   ?

## 2021-10-09 DIAGNOSIS — R296 Repeated falls: Secondary | ICD-10-CM | POA: Diagnosis not present

## 2021-10-09 DIAGNOSIS — R2681 Unsteadiness on feet: Secondary | ICD-10-CM | POA: Diagnosis not present

## 2021-10-09 DIAGNOSIS — M6281 Muscle weakness (generalized): Secondary | ICD-10-CM | POA: Diagnosis not present

## 2021-10-11 DIAGNOSIS — R296 Repeated falls: Secondary | ICD-10-CM | POA: Diagnosis not present

## 2021-10-11 DIAGNOSIS — R2681 Unsteadiness on feet: Secondary | ICD-10-CM | POA: Diagnosis not present

## 2021-10-11 DIAGNOSIS — M6281 Muscle weakness (generalized): Secondary | ICD-10-CM | POA: Diagnosis not present

## 2021-10-12 ENCOUNTER — Ambulatory Visit: Payer: Medicare Other | Admitting: Family Medicine

## 2021-10-16 DIAGNOSIS — R0982 Postnasal drip: Secondary | ICD-10-CM | POA: Diagnosis not present

## 2021-10-16 DIAGNOSIS — H903 Sensorineural hearing loss, bilateral: Secondary | ICD-10-CM | POA: Diagnosis not present

## 2021-10-16 DIAGNOSIS — R1314 Dysphagia, pharyngoesophageal phase: Secondary | ICD-10-CM | POA: Diagnosis not present

## 2021-10-19 ENCOUNTER — Other Ambulatory Visit: Payer: Self-pay | Admitting: Otolaryngology

## 2021-10-19 DIAGNOSIS — R296 Repeated falls: Secondary | ICD-10-CM | POA: Diagnosis not present

## 2021-10-19 DIAGNOSIS — R4182 Altered mental status, unspecified: Secondary | ICD-10-CM

## 2021-10-19 DIAGNOSIS — R2681 Unsteadiness on feet: Secondary | ICD-10-CM | POA: Diagnosis not present

## 2021-10-19 DIAGNOSIS — M6281 Muscle weakness (generalized): Secondary | ICD-10-CM | POA: Diagnosis not present

## 2021-10-22 ENCOUNTER — Emergency Department: Payer: Medicare Other

## 2021-10-22 ENCOUNTER — Other Ambulatory Visit: Payer: Self-pay

## 2021-10-22 ENCOUNTER — Encounter: Payer: Self-pay | Admitting: Emergency Medicine

## 2021-10-22 DIAGNOSIS — M810 Age-related osteoporosis without current pathological fracture: Secondary | ICD-10-CM | POA: Diagnosis present

## 2021-10-22 DIAGNOSIS — Z87891 Personal history of nicotine dependence: Secondary | ICD-10-CM

## 2021-10-22 DIAGNOSIS — G9341 Metabolic encephalopathy: Secondary | ICD-10-CM | POA: Diagnosis not present

## 2021-10-22 DIAGNOSIS — K219 Gastro-esophageal reflux disease without esophagitis: Secondary | ICD-10-CM | POA: Diagnosis present

## 2021-10-22 DIAGNOSIS — Z8042 Family history of malignant neoplasm of prostate: Secondary | ICD-10-CM | POA: Diagnosis not present

## 2021-10-22 DIAGNOSIS — R32 Unspecified urinary incontinence: Secondary | ICD-10-CM | POA: Diagnosis present

## 2021-10-22 DIAGNOSIS — K224 Dyskinesia of esophagus: Secondary | ICD-10-CM | POA: Diagnosis present

## 2021-10-22 DIAGNOSIS — F329 Major depressive disorder, single episode, unspecified: Secondary | ICD-10-CM | POA: Diagnosis not present

## 2021-10-22 DIAGNOSIS — Z888 Allergy status to other drugs, medicaments and biological substances status: Secondary | ICD-10-CM

## 2021-10-22 DIAGNOSIS — I080 Rheumatic disorders of both mitral and aortic valves: Secondary | ICD-10-CM | POA: Diagnosis present

## 2021-10-22 DIAGNOSIS — Z7189 Other specified counseling: Secondary | ICD-10-CM | POA: Diagnosis not present

## 2021-10-22 DIAGNOSIS — Z66 Do not resuscitate: Secondary | ICD-10-CM | POA: Diagnosis not present

## 2021-10-22 DIAGNOSIS — Z8711 Personal history of peptic ulcer disease: Secondary | ICD-10-CM

## 2021-10-22 DIAGNOSIS — Z8546 Personal history of malignant neoplasm of prostate: Secondary | ICD-10-CM

## 2021-10-22 DIAGNOSIS — R404 Transient alteration of awareness: Secondary | ICD-10-CM | POA: Diagnosis not present

## 2021-10-22 DIAGNOSIS — I6202 Nontraumatic subacute subdural hemorrhage: Secondary | ICD-10-CM | POA: Diagnosis not present

## 2021-10-22 DIAGNOSIS — R4182 Altered mental status, unspecified: Secondary | ICD-10-CM | POA: Diagnosis not present

## 2021-10-22 DIAGNOSIS — G629 Polyneuropathy, unspecified: Secondary | ICD-10-CM | POA: Diagnosis present

## 2021-10-22 DIAGNOSIS — Z515 Encounter for palliative care: Secondary | ICD-10-CM | POA: Diagnosis not present

## 2021-10-22 DIAGNOSIS — R059 Cough, unspecified: Secondary | ICD-10-CM | POA: Diagnosis not present

## 2021-10-22 DIAGNOSIS — R410 Disorientation, unspecified: Secondary | ICD-10-CM | POA: Diagnosis not present

## 2021-10-22 DIAGNOSIS — R1310 Dysphagia, unspecified: Secondary | ICD-10-CM | POA: Diagnosis not present

## 2021-10-22 DIAGNOSIS — Z79899 Other long term (current) drug therapy: Secondary | ICD-10-CM

## 2021-10-22 DIAGNOSIS — K222 Esophageal obstruction: Secondary | ICD-10-CM | POA: Diagnosis present

## 2021-10-22 DIAGNOSIS — R1319 Other dysphagia: Secondary | ICD-10-CM | POA: Diagnosis not present

## 2021-10-22 DIAGNOSIS — B9620 Unspecified Escherichia coli [E. coli] as the cause of diseases classified elsewhere: Secondary | ICD-10-CM | POA: Diagnosis present

## 2021-10-22 DIAGNOSIS — R531 Weakness: Secondary | ICD-10-CM | POA: Diagnosis present

## 2021-10-22 DIAGNOSIS — M1990 Unspecified osteoarthritis, unspecified site: Secondary | ICD-10-CM | POA: Diagnosis present

## 2021-10-22 DIAGNOSIS — E785 Hyperlipidemia, unspecified: Secondary | ICD-10-CM | POA: Diagnosis present

## 2021-10-22 DIAGNOSIS — N3000 Acute cystitis without hematuria: Secondary | ICD-10-CM | POA: Diagnosis not present

## 2021-10-22 DIAGNOSIS — Z20822 Contact with and (suspected) exposure to covid-19: Secondary | ICD-10-CM | POA: Diagnosis not present

## 2021-10-22 DIAGNOSIS — R634 Abnormal weight loss: Secondary | ICD-10-CM | POA: Diagnosis present

## 2021-10-22 DIAGNOSIS — Z8673 Personal history of transient ischemic attack (TIA), and cerebral infarction without residual deficits: Secondary | ICD-10-CM | POA: Diagnosis not present

## 2021-10-22 DIAGNOSIS — Z7982 Long term (current) use of aspirin: Secondary | ICD-10-CM

## 2021-10-22 DIAGNOSIS — N390 Urinary tract infection, site not specified: Principal | ICD-10-CM | POA: Diagnosis present

## 2021-10-22 DIAGNOSIS — Z6821 Body mass index (BMI) 21.0-21.9, adult: Secondary | ICD-10-CM | POA: Diagnosis not present

## 2021-10-22 DIAGNOSIS — Z823 Family history of stroke: Secondary | ICD-10-CM

## 2021-10-22 DIAGNOSIS — I10 Essential (primary) hypertension: Secondary | ICD-10-CM | POA: Diagnosis not present

## 2021-10-22 DIAGNOSIS — R7303 Prediabetes: Secondary | ICD-10-CM | POA: Diagnosis present

## 2021-10-22 DIAGNOSIS — R251 Tremor, unspecified: Secondary | ICD-10-CM | POA: Diagnosis not present

## 2021-10-22 DIAGNOSIS — R29818 Other symptoms and signs involving the nervous system: Secondary | ICD-10-CM | POA: Diagnosis not present

## 2021-10-22 DIAGNOSIS — I4891 Unspecified atrial fibrillation: Secondary | ICD-10-CM | POA: Diagnosis present

## 2021-10-22 DIAGNOSIS — F0393 Unspecified dementia, unspecified severity, with mood disturbance: Secondary | ICD-10-CM | POA: Diagnosis present

## 2021-10-22 DIAGNOSIS — Z743 Need for continuous supervision: Secondary | ICD-10-CM | POA: Diagnosis not present

## 2021-10-22 LAB — CBC
HCT: 43.4 % (ref 39.0–52.0)
Hemoglobin: 13.5 g/dL (ref 13.0–17.0)
MCH: 29.7 pg (ref 26.0–34.0)
MCHC: 31.1 g/dL (ref 30.0–36.0)
MCV: 95.6 fL (ref 80.0–100.0)
Platelets: 247 10*3/uL (ref 150–400)
RBC: 4.54 MIL/uL (ref 4.22–5.81)
RDW: 14 % (ref 11.5–15.5)
WBC: 7.5 10*3/uL (ref 4.0–10.5)
nRBC: 0 % (ref 0.0–0.2)

## 2021-10-22 LAB — COMPREHENSIVE METABOLIC PANEL
ALT: 11 U/L (ref 0–44)
AST: 20 U/L (ref 15–41)
Albumin: 3.6 g/dL (ref 3.5–5.0)
Alkaline Phosphatase: 83 U/L (ref 38–126)
Anion gap: 9 (ref 5–15)
BUN: 52 mg/dL — ABNORMAL HIGH (ref 8–23)
CO2: 29 mmol/L (ref 22–32)
Calcium: 10.6 mg/dL — ABNORMAL HIGH (ref 8.9–10.3)
Chloride: 101 mmol/L (ref 98–111)
Creatinine, Ser: 0.91 mg/dL (ref 0.61–1.24)
GFR, Estimated: >60 mL/min (ref >60)
Glucose, Bld: 155 mg/dL — ABNORMAL HIGH (ref 70–99)
Potassium: 4.4 mmol/L (ref 3.5–5.1)
Sodium: 139 mmol/L (ref 135–145)
Total Bilirubin: 0.5 mg/dL (ref 0.3–1.2)
Total Protein: 7.2 g/dL (ref 6.5–8.1)

## 2021-10-22 LAB — CBG MONITORING, ED: Glucose-Capillary: 126 mg/dL — ABNORMAL HIGH (ref 70–99)

## 2021-10-22 NOTE — ED Triage Notes (Signed)
Pt to ED via POV with c/o increasing weakness/confusion/sleeping more/depression/anxiety, per wife. Pt presents alert and able to answer questions in triage, noted cough in triage, denies any pain. Pt noted to be alert, however confused regarding place and year.  ?

## 2021-10-23 ENCOUNTER — Inpatient Hospital Stay
Admission: EM | Admit: 2021-10-23 | Discharge: 2021-10-25 | DRG: 689 | Disposition: A | Discharge status: Nursing Home (Includes ICF) | Payer: Medicare Other | Attending: Internal Medicine | Admitting: Internal Medicine

## 2021-10-23 ENCOUNTER — Encounter: Payer: Self-pay | Admitting: Internal Medicine

## 2021-10-23 ENCOUNTER — Emergency Department: Payer: Medicare Other

## 2021-10-23 ENCOUNTER — Inpatient Hospital Stay: Payer: Medicare Other

## 2021-10-23 DIAGNOSIS — I4891 Unspecified atrial fibrillation: Secondary | ICD-10-CM | POA: Diagnosis present

## 2021-10-23 DIAGNOSIS — E785 Hyperlipidemia, unspecified: Secondary | ICD-10-CM | POA: Diagnosis present

## 2021-10-23 DIAGNOSIS — F329 Major depressive disorder, single episode, unspecified: Secondary | ICD-10-CM

## 2021-10-23 DIAGNOSIS — F32A Depression, unspecified: Secondary | ICD-10-CM

## 2021-10-23 DIAGNOSIS — Z20822 Contact with and (suspected) exposure to covid-19: Secondary | ICD-10-CM | POA: Diagnosis present

## 2021-10-23 DIAGNOSIS — I6202 Nontraumatic subacute subdural hemorrhage: Secondary | ICD-10-CM | POA: Diagnosis not present

## 2021-10-23 DIAGNOSIS — Z66 Do not resuscitate: Secondary | ICD-10-CM | POA: Diagnosis present

## 2021-10-23 DIAGNOSIS — Z7189 Other specified counseling: Secondary | ICD-10-CM | POA: Diagnosis not present

## 2021-10-23 DIAGNOSIS — Z789 Other specified health status: Secondary | ICD-10-CM

## 2021-10-23 DIAGNOSIS — Z8042 Family history of malignant neoplasm of prostate: Secondary | ICD-10-CM | POA: Diagnosis not present

## 2021-10-23 DIAGNOSIS — R531 Weakness: Secondary | ICD-10-CM | POA: Diagnosis not present

## 2021-10-23 DIAGNOSIS — Z8673 Personal history of transient ischemic attack (TIA), and cerebral infarction without residual deficits: Secondary | ICD-10-CM | POA: Diagnosis not present

## 2021-10-23 DIAGNOSIS — N390 Urinary tract infection, site not specified: Secondary | ICD-10-CM | POA: Diagnosis present

## 2021-10-23 DIAGNOSIS — R1310 Dysphagia, unspecified: Secondary | ICD-10-CM | POA: Diagnosis not present

## 2021-10-23 DIAGNOSIS — R634 Abnormal weight loss: Secondary | ICD-10-CM | POA: Diagnosis present

## 2021-10-23 DIAGNOSIS — R29818 Other symptoms and signs involving the nervous system: Secondary | ICD-10-CM | POA: Diagnosis not present

## 2021-10-23 DIAGNOSIS — Z87891 Personal history of nicotine dependence: Secondary | ICD-10-CM | POA: Diagnosis not present

## 2021-10-23 DIAGNOSIS — B9620 Unspecified Escherichia coli [E. coli] as the cause of diseases classified elsewhere: Secondary | ICD-10-CM | POA: Diagnosis present

## 2021-10-23 DIAGNOSIS — K222 Esophageal obstruction: Secondary | ICD-10-CM | POA: Diagnosis present

## 2021-10-23 DIAGNOSIS — I10 Essential (primary) hypertension: Secondary | ICD-10-CM | POA: Diagnosis present

## 2021-10-23 DIAGNOSIS — R4182 Altered mental status, unspecified: Secondary | ICD-10-CM | POA: Diagnosis not present

## 2021-10-23 DIAGNOSIS — N3000 Acute cystitis without hematuria: Secondary | ICD-10-CM

## 2021-10-23 DIAGNOSIS — G9341 Metabolic encephalopathy: Secondary | ICD-10-CM | POA: Diagnosis present

## 2021-10-23 DIAGNOSIS — M810 Age-related osteoporosis without current pathological fracture: Secondary | ICD-10-CM | POA: Diagnosis present

## 2021-10-23 DIAGNOSIS — F0393 Unspecified dementia, unspecified severity, with mood disturbance: Secondary | ICD-10-CM | POA: Diagnosis present

## 2021-10-23 DIAGNOSIS — G629 Polyneuropathy, unspecified: Secondary | ICD-10-CM | POA: Diagnosis present

## 2021-10-23 DIAGNOSIS — Z8711 Personal history of peptic ulcer disease: Secondary | ICD-10-CM | POA: Diagnosis not present

## 2021-10-23 DIAGNOSIS — R410 Disorientation, unspecified: Secondary | ICD-10-CM | POA: Diagnosis not present

## 2021-10-23 DIAGNOSIS — Z823 Family history of stroke: Secondary | ICD-10-CM | POA: Diagnosis not present

## 2021-10-23 DIAGNOSIS — R1319 Other dysphagia: Secondary | ICD-10-CM | POA: Diagnosis not present

## 2021-10-23 DIAGNOSIS — K224 Dyskinesia of esophagus: Secondary | ICD-10-CM | POA: Diagnosis present

## 2021-10-23 DIAGNOSIS — Z743 Need for continuous supervision: Secondary | ICD-10-CM | POA: Diagnosis not present

## 2021-10-23 DIAGNOSIS — Z6821 Body mass index (BMI) 21.0-21.9, adult: Secondary | ICD-10-CM | POA: Diagnosis not present

## 2021-10-23 DIAGNOSIS — Z8546 Personal history of malignant neoplasm of prostate: Secondary | ICD-10-CM | POA: Diagnosis not present

## 2021-10-23 DIAGNOSIS — K219 Gastro-esophageal reflux disease without esophagitis: Secondary | ICD-10-CM | POA: Diagnosis present

## 2021-10-23 DIAGNOSIS — R32 Unspecified urinary incontinence: Secondary | ICD-10-CM | POA: Diagnosis present

## 2021-10-23 DIAGNOSIS — R404 Transient alteration of awareness: Secondary | ICD-10-CM | POA: Diagnosis not present

## 2021-10-23 DIAGNOSIS — R251 Tremor, unspecified: Secondary | ICD-10-CM | POA: Diagnosis not present

## 2021-10-23 DIAGNOSIS — Z515 Encounter for palliative care: Secondary | ICD-10-CM | POA: Diagnosis not present

## 2021-10-23 DIAGNOSIS — I080 Rheumatic disorders of both mitral and aortic valves: Secondary | ICD-10-CM | POA: Diagnosis present

## 2021-10-23 LAB — URINALYSIS, ROUTINE W REFLEX MICROSCOPIC
Bilirubin Urine: NEGATIVE
Glucose, UA: NEGATIVE mg/dL
Hgb urine dipstick: NEGATIVE
Ketones, ur: NEGATIVE mg/dL
Nitrite: POSITIVE — AB
Protein, ur: NEGATIVE mg/dL
Specific Gravity, Urine: 1.017 (ref 1.005–1.030)
WBC, UA: >50 WBC/hpf — ABNORMAL HIGH (ref 0–5)
pH: 7 (ref 5.0–8.0)

## 2021-10-23 LAB — TROPONIN I (HIGH SENSITIVITY): Troponin I (High Sensitivity): 38 ng/L — ABNORMAL HIGH (ref <18)

## 2021-10-23 LAB — RESP PANEL BY RT-PCR (FLU A&B, COVID) ARPGX2
Influenza A by PCR: NEGATIVE
Influenza B by PCR: NEGATIVE
SARS Coronavirus 2 by RT PCR: NEGATIVE

## 2021-10-23 LAB — GLUCOSE, CAPILLARY
Glucose-Capillary: 139 mg/dL — ABNORMAL HIGH (ref 70–99)
Glucose-Capillary: 107 mg/dL — ABNORMAL HIGH (ref 70–99)

## 2021-10-23 MED ORDER — SODIUM CHLORIDE 0.9 % IV SOLN
1.0000 g | Freq: Once | INTRAVENOUS | Status: AC
Start: 2021-10-23 — End: 2021-10-23
  Administered 2021-10-23: 1 g via INTRAVENOUS
  Filled 2021-10-23: qty 10

## 2021-10-23 MED ORDER — AMLODIPINE BESYLATE 5 MG PO TABS
5.0000 mg | ORAL_TABLET | Freq: Every day | ORAL | Status: DC
  Administered 2021-10-23 – 2021-10-24 (×2): 5 mg via ORAL
  Filled 2021-10-23 (×3): qty 1

## 2021-10-23 MED ORDER — ENOXAPARIN SODIUM 40 MG/0.4ML IJ SOSY
40.0000 mg | PREFILLED_SYRINGE | INTRAMUSCULAR | Status: DC
  Administered 2021-10-23 – 2021-10-24 (×2): 40 mg via SUBCUTANEOUS
  Filled 2021-10-23 (×2): qty 0.4

## 2021-10-23 MED ORDER — SODIUM CHLORIDE 0.9 % IV SOLN: INTRAVENOUS | Status: DC

## 2021-10-23 MED ORDER — SODIUM CHLORIDE 0.9 % IV SOLN
1.0000 g | INTRAVENOUS | Status: DC
  Administered 2021-10-24 – 2021-10-25 (×2): 1 g via INTRAVENOUS
  Filled 2021-10-23: qty 10
  Filled 2021-10-23: qty 1

## 2021-10-23 MED ORDER — ACETAMINOPHEN 650 MG RE SUPP: 650.0000 mg | Freq: Four times a day (QID) | RECTAL | Status: DC | PRN

## 2021-10-23 MED ORDER — CHLORHEXIDINE GLUCONATE 0.12 % MT SOLN
15.0000 mL | Freq: Two times a day (BID) | OROMUCOSAL | Status: DC
  Administered 2021-10-23 – 2021-10-24 (×3): 15 mL via OROMUCOSAL
  Filled 2021-10-23 (×4): qty 15

## 2021-10-23 MED ORDER — ACETAMINOPHEN 325 MG PO TABS
650.0000 mg | ORAL_TABLET | Freq: Four times a day (QID) | ORAL | Status: DC | PRN
  Administered 2021-10-23 – 2021-10-24 (×2): 650 mg via ORAL
  Filled 2021-10-23 (×2): qty 2

## 2021-10-23 MED ORDER — POLYVINYL ALCOHOL 1.4 % OP SOLN: 1.0000 [drp] | Freq: Every day | OPHTHALMIC | Status: DC | PRN

## 2021-10-23 MED ORDER — FLUTICASONE PROPIONATE 50 MCG/ACT NA SUSP: 1.0000 | Freq: Every day | NASAL | Status: DC | PRN

## 2021-10-23 MED ORDER — ALBUTEROL SULFATE (2.5 MG/3ML) 0.083% IN NEBU: 2.5000 mg | INHALATION_SOLUTION | RESPIRATORY_TRACT | Status: DC | PRN

## 2021-10-23 MED ORDER — BUSPIRONE HCL 10 MG PO TABS
10.0000 mg | ORAL_TABLET | Freq: Two times a day (BID) | ORAL | Status: DC
Start: 2021-10-23 — End: 2021-10-25
  Administered 2021-10-23 – 2021-10-24 (×4): 10 mg via ORAL
  Filled 2021-10-23 (×5): qty 1

## 2021-10-23 MED ORDER — SODIUM CHLORIDE 0.9 % IV SOLN: INTRAVENOUS | Status: AC

## 2021-10-23 MED ORDER — ORAL CARE MOUTH RINSE: 15.0000 mL | Freq: Two times a day (BID) | OROMUCOSAL | Status: DC

## 2021-10-23 MED ORDER — ASPIRIN EC 81 MG PO TBEC
81.0000 mg | DELAYED_RELEASE_TABLET | Freq: Every day | ORAL | Status: DC
  Administered 2021-10-23 – 2021-10-24 (×2): 81 mg via ORAL
  Filled 2021-10-23 (×3): qty 1

## 2021-10-23 MED ORDER — ONDANSETRON HCL 4 MG/2ML IJ SOLN: 4.0000 mg | Freq: Three times a day (TID) | INTRAMUSCULAR | Status: DC | PRN

## 2021-10-23 NOTE — Assessment & Plan Note (Signed)
Patient presents for evaluation of worsening confusion and is found to have significant pyuria ?Prior urine culture yielded pansensitive E. Coli ?We will treat patient empirically with Rocephin 1 g IV daily ?Follow-up results of urine culture ?

## 2021-10-23 NOTE — Assessment & Plan Note (Signed)
Blood pressure is stable Continue amlodipine 

## 2021-10-23 NOTE — Consult Note (Signed)
?  ?[name] , MD ?[address] [place], [place], [place], [address] ?[address] [place], [place], [place], [address] ?Phone: [phone]  ?Fax: [phone] ? Consultation ? ?Referring Provider:     Dr Francine Graven ?Primary Care Physician:  [name], MD ?Primary Gastroenterologist:  Dr. Vicente Males         ?Reason for Consultation:     Dysphagia  ? ?Date of Admission:  10/23/2021 ?Date of Consultation:  10/23/2021 ?       ? HPI:   ?[name] is a 88 y.o. male was seen by myself on 09/11/2021 at the office for dysphagia.  He has a history of difficulty with swallowing particularly with foods such as nuts.  I performed an upper endoscopy on 09/13/2021 and found to benign intrinsic stenosis at the GE junction.  I dilated to 18 mm.  There was a medium sized hiatal hernia.  Biopsies of the esophagus were taken. ? ?The patient subsequently had further difficulty with swallowing and was seen by Dr. Brita Romp at her office.  Had a barium swallow which showed esophageal dysmotility and a small hiatal hernia with a Schatzki's ring that obstructed the passage of a 13 mm barium tablet.  Had initially placed him on a video visit but did not show for the appointment.  Presently he is admitted to the hospital with altered mental status.  Has been issues with some memory ?[name]  On admission had a CT scan of the head that showed no acute intracranial abnormality ?On admission CBC, CMP showed no gross abnormalities large leukocytes and nitrites present in the urine suggestive of a UTI which she is being treated for. ? ? ?Presently he said he felt much better , he said his swallowing was ok for pureed food but when he tries to eat solid food has it get stuck, pointed to his neck. Denies any other complaints.  ? ? ?Past Medical History:  ?Diagnosis Date  ? Anemia   ? Aortic valve sclerosis   ? Arthritis   ? Bladder outlet obstruction   ? Erectile dysfunction   ? GERD (gastroesophageal reflux disease)   ? h/o  ? Hard of  hearing   ? HBP (high blood pressure)   ? Heart murmur   ? History of kidney stones   ? History of nephrolithiasis   ? HLD (hyperlipidemia)   ? Incomplete bladder emptying   ? Mitral regurgitation   ? Neuropathy   ? Nocturia   ? Osteoporosis   ? Pre-diabetes   ? Prostate cancer ([facility])   ? Shingles 07/08/2012  ? Sinus problem   ? Stomach ulcer   ? Stroke ([place]) 07/08/2010  ? Urinary frequency   ? Urinary leakage   ? ? ?Past Surgical History:  ?Procedure Laterality Date  ? CATARACT EXTRACTION  2000, 2001  ? CHOLECYSTECTOMY  07/08/1970  ? CYSTOSCOPY/URETEROSCOPY/HOLMIUM LASER/STENT PLACEMENT Left 03/23/2021  ? Procedure: CYSTOSCOPY/URETEROSCOPY/HOLMIUM LASER/STENT PLACEMENT;  Surgeon: Billey Co, MD;  Location: [facility] ORS;  Service: Urology;  Laterality: Left;  ? ESOPHAGOGASTRODUODENOSCOPY (EGD) WITH PROPOFOL N/A 09/13/2021  ? Procedure: ESOPHAGOGASTRODUODENOSCOPY (EGD) WITH PROPOFOL;  Surgeon: [name], MD;  Location: [facility] ENDOSCOPY;  Service: Gastroenterology;  Laterality: N/A;  ? PROSTATE SURGERY  2008/2009  ? cryotherapy  ? TONSILLECTOMY    ? UMBILICAL HERNIA REPAIR    ? ? ?Prior to Admission medications   ?Medication Sig Start Date End Date Taking? Authorizing Provider  ?amLODipine (NORVASC) 5 MG tablet Take 1 tablet by  mouth once daily 06/28/21  Yes Bacigalupo, Dionne Bucy, MD  ?aspirin EC 81 MG tablet Take 81 mg by mouth daily. Swallow whole.   Yes [provider]  ?busPIRone (BUSPAR) 10 MG tablet Take 1 tablet (10 mg total) by mouth 2 (two) times daily. 07/26/21  Yes Bacigalupo, Dionne Bucy, MD  ?fluticasone (FLONASE) 50 MCG/ACT nasal spray Place 1 spray into both nostrils daily. 11/27/20  Yes Bacigalupo, Dionne Bucy, MD  ?furosemide (LASIX) 10 MG/ML solution Take 6 mLs (60 mg total) by mouth daily. 10/08/21  Yes Bacigalupo, Dionne Bucy, MD  ?furosemide (LASIX) 40 MG tablet Take 40 mg by mouth daily. 10/05/21  Yes [provider]  ?naproxen sodium (ALEVE) 220 MG tablet Take 1 tablet (220 mg total) by mouth  daily as needed (pain/headache). 08/05/21   Victorino Dike, FNP  ?Polyethyl Glycol-Propyl Glycol (SYSTANE OP) Place 1 drop into both eyes daily as needed (dry eye).    [provider]  ? ? ?Family History  ?Problem Relation Age of Onset  ? Cancer Mother   ? Stroke Father   ? Stroke Sister   ? Prostate cancer Brother   ? Kidney disease Neg Hx   ? Kidney cancer Neg Hx   ? Bladder Cancer Neg Hx   ?  ? ?Social History  ? ?Tobacco Use  ? Smoking status: Former  ?  Years: 10.00  ?  Types: Cigarettes  ?  Quit date: 07/08/1962  ?  Years since quitting: 59.3  ? Smokeless tobacco: Never  ? Tobacco comments:  ?  quit 1964  ?Vaping Use  ? Vaping Use: Never used  ?Substance Use Topics  ? Alcohol use: Yes  ?  Alcohol/week: 1.0 - 2.0 standard drink  ?  Types: 1 Glasses of wine per week  ?  Comment: every 2-3 mths, 1 glass of wine daily    ? Drug use: No  ? ? ?Allergies as of 10/22/2021 - Review Complete 10/22/2021  ?Allergen Reaction Noted  ? Toviaz [fesoterodine fumarate er] Other (See Comments) 01/03/2015  ? ? ?Review of Systems:    ?All systems reviewed and negative except where noted in HPI. ? ? Physical Exam:  ?Vital signs in last 24 hours: ?Temp:  [97.7 ?F (36.5 ?C)-98.2 ?F (36.8 ?C)] 98.2 ?F (36.8 ?C) (04/18 1149) ?Pulse Rate:  [61-107] 61 (04/18 1149) ?Resp:  [17-20] 18 (04/18 1149) ?BP: (112-156)/(76-101) 136/89 (04/18 1149) ?SpO2:  [91 %-100 %] 100 % (04/18 1149) ?Weight:  [66.7 kg] 66.7 kg (04/17 2213) ?  ?General:   Pleasant, cooperative in NAD ?Head:  Normocephalic and atraumatic. ?Eyes:   No icterus.   Conjunctiva pink. PERRLA. ?Ears:  Normal auditory acuity. ?Neurologic:  Alert and oriented x3;  grossly normal neurologically. ?Skin:  Intact without significant lesions or rashes. ?Cervical Nodes:  No significant cervical adenopathy. ?Psych:  Alert and cooperative. Normal affect. ? ?LAB RESULTS: ?Recent Labs  ?  10/22/21 ?2217  ?WBC 7.5  ?HGB 13.5  ?HCT 43.4  ?PLT 247  ? ?BMET ?Recent Labs  ?  10/22/21 ?2217   ?NA 139  ?K 4.4  ?CL 101  ?CO2 29  ?GLUCOSE 155*  ?BUN 52*  ?CREATININE 0.91  ?CALCIUM 10.6*  ? ?LFT ?Recent Labs  ?  10/22/21 ?2217  ?PROT 7.2  ?ALBUMIN 3.6  ?AST 20  ?ALT 11  ?ALKPHOS 83  ?BILITOT 0.5  ? ?PT/INR ?No results for input(s): LABPROT, INR in the last 72 hours. ? ?STUDIES: ?DG Chest 2 View ? ?Result  Date: 10/22/2021 ?CLINICAL DATA:  Cough. EXAM: CHEST - 2 VIEW COMPARISON:  Chest CT dated 09/19/2021. FINDINGS: No focal consolidation, pleural effusion, or pneumothorax. The cardiac silhouette is within normal limits. Atherosclerotic calcification of the aorta no acute osseous pathology. Osteopenia with degenerative changes of the spine. IMPRESSION: No active cardiopulmonary disease. Electronically Signed   By: Anner Crete M.D.   On: 10/22/2021 23:15  ? ?CT HEAD WO CONTRAST (5MM) ? ?Result Date: 10/23/2021 ?CLINICAL DATA:  Mental status change, unknown cause EXAM: CT HEAD WITHOUT CONTRAST TECHNIQUE: Contiguous axial images were obtained from the base of the skull through the vertex without intravenous contrast. RADIATION DOSE REDUCTION: This exam was performed according to the departmental dose-optimization program which includes automated exposure control, adjustment of the mA and/or kV according to patient size and/or use of iterative reconstruction technique. COMPARISON:  09/19/2021 FINDINGS: Brain: Normal anatomic configuration. Moderate parenchymal volume loss is commensurate with the patient's age. Moderate periventricular white matter changes are present likely reflecting the sequela of small vessel ischemia. Remote lacunar infarct noted within the right basal ganglia, unchanged. No abnormal intra or extra-axial mass lesion or fluid collection. No abnormal mass effect or midline shift. No evidence of acute intracranial hemorrhage or infarct. Borderline ventriculomegaly is commensurate with the degree of parenchymal volume loss and likely represents central atrophy. Cerebellum unremarkable.  Vascular: No asymmetric hyperdense vasculature at the skull base. Skull: Intact Sinuses/Orbits: Small retention cyst or polyp within the right maxillary sinus. Remaining paranasal sinuses are clear. Ocular lenses h

## 2021-10-23 NOTE — ED Notes (Signed)
Dr. Francine Graven at bedside at this time. ?

## 2021-10-23 NOTE — ED Notes (Signed)
Informed RN bed assigned 

## 2021-10-23 NOTE — Progress Notes (Signed)
Physical Therapy Evaluation ?Patient Details ?Name: [name] ?MRN: [id number] ?DOB: 03/28/1925 ?Today's Date: 10/23/2021 ? ?History of Present Illness ? [name] is a 88 y.o. male with medical history significant for dysphagia status post recent dilatation of mild esophageal stricture, GERD, hypertension who was brought into the ER by private vehicle for evaluation of increased weakness, worsening confusion and depressed mood. ? ?  ?Clinical Impression ? Pt received in Semi-Fowler's position and agreeable to therapy.  Pt somewhat HOH, but has hearing aids to assist. Pt able to perform transfers with good technique and come to the EOB seated without any UE support.  Pt denies any dizziness prior to coming upright.  Pt then transferred up to standing with RW and ambulated around the nursing station x2.  Pt with noted flexed posture as ambulation continued.  Pt with slow, but steady gait, noting that the RW is not as forgiving as the rollator he typically used when hitting a "high spot" in the floor.  Pt able to safely transfer to recliner with all needs met.  Nursing notified of mobility and placement, along with catheter being undone for mobility.  Pt able to recall living at [name] and is looking forward to getting back home with wife.  Current discharge plans to home with HHPT from [facility] remain appropriate at this time.  Pt will continue to benefit from skilled therapy in order to address deficits listed below. ? ?   ? ?Recommendations for follow up therapy are one component of a multi-disciplinary discharge planning process, led by the attending physician.  Recommendations may be updated based on patient status, additional functional criteria and insurance authorization. ? ?Follow Up Recommendations Home health PT ? ?  ?Assistance Recommended at Discharge PRN  ?Patient can return home with the following ? A little help with walking and/or transfers;A little help with  bathing/dressing/bathroom ? ?  ?Equipment Recommendations None recommended by PT  ?Recommendations for Other Services ?    ?  ?Functional Status Assessment Patient has had a recent decline in their functional status and demonstrates the ability to make significant improvements in function in a reasonable and predictable amount of time.  ? ?  ?Precautions / Restrictions Precautions ?Precautions: None ?Restrictions ?Weight Bearing Restrictions: No  ? ?  ? ?Mobility ? Bed Mobility ?Overal bed mobility: Modified Independent ?  ?  ?  ?  ?  ?  ?  ?  ? ?Transfers ?Overall transfer level: Needs assistance ?Equipment used: Rolling walker (2 wheels) ?Transfers: Sit to/from Stand ?Sit to Stand: Min guard ?  ?  ?  ?  ?  ?General transfer comment: Pt with good technique, utilizing hand rails and HOB elevated. ?  ? ?Ambulation/Gait ?Ambulation/Gait assistance: Min guard ?Gait Distance (Feet): 320 Feet ?Assistive device: Rolling walker (2 wheels) ?Gait Pattern/deviations: Step-through pattern, Narrow base of support ?Gait velocity: decreased ?  ?  ?General Gait Details: Pt ambulated well around the nursing station, however with increased bouts of exercise, he has a more flexed posture likely due to fatigue. ? ?Stairs ?  ?  ?  ?  ?  ? ?Wheelchair Mobility ?  ? ?Modified Rankin (Stroke Patients Only) ?  ? ?  ? ?Balance   ?  ?  ?  ?  ?  ?  ?  ?  ?  ?  ?  ?  ?  ?  ?  ?  ?  ?  ?   ? ? ? ?Pertinent Vitals/Pain  Pain Assessment ?Pain Assessment: No/denies pain  ? ? ?Home Living Family/patient expects to be discharged to:: Private residence ?Living Arrangements: Spouse/significant other ?Available Help at Discharge: Family (Wife is present, but not able to help much.) ?Type of Home: House ?Home Access: Level entry ?  ?  ?  ?Home Layout: One level ?Home Equipment: Rollator (4 wheels) ?Additional Comments: Pt currently residing at [name].  ?  ?Prior Function Prior Level of Function : Independent/Modified Independent ?  ?  ?  ?  ?  ?  ?   ?  ?  ? ? ?Hand Dominance  ? Dominant Hand: Right ? ?  ?Extremity/Trunk Assessment  ? Upper Extremity Assessment ?Upper Extremity Assessment: Overall WFL for tasks assessed ?  ? ?Lower Extremity Assessment ?Lower Extremity Assessment: Overall WFL for tasks assessed ?  ? ?   ?Communication  ? Communication: HOH  ?Cognition Arousal/Alertness: Awake/alert ?Behavior During Therapy: [facility] for tasks assessed/performed ?Overall Cognitive Status: History of cognitive impairments - at baseline ?  ?  ?  ?  ?  ?  ?  ?  ?  ?  ?  ?  ?  ?  ?  ?  ?General Comments: Pt confused occasionally throughout session. ?  ?  ? ?  ?General Comments   ? ?  ?Exercises    ? ?Assessment/Plan  ?  ?PT Assessment Patient needs continued PT services  ?PT Problem List Decreased strength;Decreased activity tolerance;Decreased balance;Decreased mobility;Decreased cognition;Decreased knowledge of use of DME ? ?   ?  ?PT Treatment Interventions DME instruction;Gait training;Functional mobility training;Therapeutic activities;Therapeutic exercise;Balance training;Neuromuscular re-education   ? ?PT Goals (Current goals can be found in the Care Plan section)  ?Acute Rehab PT Goals ?Patient Stated Goal: to get back home. ?PT Goal Formulation: With patient ?Time For Goal Achievement: 11/06/21 ?Potential to Achieve Goals: Good ? ?  ?Frequency Min 2X/week ?  ? ? ?Co-evaluation   ?  ?  ?  ?  ? ? ?  ?AM-PAC PT "6 Clicks" Mobility  ?Outcome Measure Help needed turning from your back to your side while in a flat bed without using bedrails?: A Little ?Help needed moving from lying on your back to sitting on the side of a flat bed without using bedrails?: A Little ?Help needed moving to and from a bed to a chair (including a wheelchair)?: A Little ?Help needed standing up from a chair using your arms (e.g., wheelchair or bedside chair)?: A Little ?Help needed to walk in [facility] room?: A Little ?Help needed climbing 3-5 steps with a railing? : A Lot ?6 Click Score:  17 ? ?  ?End of Session Equipment Utilized During Treatment: Gait belt ?Activity Tolerance: Patient tolerated treatment well ?Patient left: in chair;with call bell/phone within reach;with chair alarm set ?Nurse Communication: Mobility status ?PT Visit Diagnosis: Unsteadiness on feet (R26.81);Other abnormalities of gait and mobility (R26.89);Muscle weakness (generalized) (M62.81);History of falling (Z91.81);Difficulty in walking, not elsewhere classified (R26.2) ?  ? ?Time: 6440-3474 ?PT Time Calculation (min) (ACUTE ONLY): 24 min ? ? ?Charges:   PT Evaluation ?$PT Eval Low Complexity: 1 Low ?PT Treatments ?$Gait Training: 8-22 mins ?  ?   ? ? ?Gwenlyn Saran, PT, DPT ?10/23/21, 5:57 PM ? ? ?Christie Nottingham ?10/23/2021, 5:54 PM ?

## 2021-10-23 NOTE — Assessment & Plan Note (Signed)
Patient has a history of dysphagia and is status post upper endoscopy which showed a benign esophageal stricture that was dilated. ?Symptoms have continued despite dilatation.   ?Patient had a swallow study which showed esophageal dysmotility as well as a small hiatal hernia. ?Continue pur?ed diet ?We will consult speech therapy for further recommendation during this hospitalization ?Administer all meds crushed with applesauce ?

## 2021-10-23 NOTE — Plan of Care (Signed)
?  Problem: Urinary Elimination: ?Goal: Signs and symptoms of infection will decrease ?Outcome: Not Progressing ?  ?Problem: Health Behavior/Discharge Planning: ?Goal: Ability to manage health-related needs will improve ?Outcome: Not Progressing ?  ?Problem: Clinical Measurements: ?Goal: Ability to maintain clinical measurements within normal limits will improve ?Outcome: Not Progressing ?Goal: Will remain free from infection ?Outcome: Not Progressing ?  ?

## 2021-10-23 NOTE — ED Notes (Signed)
Pt ambulatory to BR with aid of walker without difficulty or distress noted; urine sample obtained as ordered; pt denies c/o at this time ?

## 2021-10-23 NOTE — Assessment & Plan Note (Signed)
Per family he has had mild cognitive deficits that have worsened over the last 2 months. ?He has pyuria today which can also account for worsening confusion ?We will treat UTI with IV Rocephin ?CT scan of the head done without contrast shows old strokes ?Expect improvement in patient's mental status following treatment of acute infection ?

## 2021-10-23 NOTE — Progress Notes (Signed)
Review of consult for IV start. Noted IV obtained by ED. Consult cleared. ?

## 2021-10-23 NOTE — ED Notes (Signed)
Speech therapy at bedside at this time.

## 2021-10-23 NOTE — Progress Notes (Signed)
PT Cancellation Note ? ?Patient Details ?Name: [name] ?MRN: [id number] ?DOB: January 22, 1925 ? ? ?Cancelled Treatment:    Reason Eval/Treat Not Completed: Patient at procedure or test/unavailable.  Chart reviewed and attempted to see pt.  Pt currently off the floor having an MRI.  Will re-attempt as medically appropriate at later date/time. ? ? ?Gwenlyn Saran, PT, DPT ?10/23/21, 3:27 PM ? ?

## 2021-10-23 NOTE — H&P (Signed)
?History and Physical  ? ? ?Patient: Arianna Delsanto [place] EHU:[id number] DOB: September 30, 1924 ?DOA: 10/23/2021 ?DOS: the patient was seen and examined on 10/23/2021 ?PCP: [name], MD  ?Patient coming from: Home ? ?Chief Complaint:  ?Chief Complaint  ?Patient presents with  ? Altered Mental Status  ?Most of the history was obtained from patient's wife at the bedside. ?HPI: DEROLD DORSCH is a 88 y.o. male with medical history significant for dysphagia status post recent dilatation of mild esophageal stricture, GERD, hypertension who was brought into the ER by private vehicle for evaluation of increased weakness, worsening confusion and depressed mood. ?Patient's wife states that he has had episodes of confusion in the past but has worsened over the last 2 months and he has shown progressive decline in his cognitive abilities.  He has also become increasingly weak and incontinent.  He had a recent doctor's appointment and forgot to put on his shoes which is very unusual for him.  She also notes increased irritability and frustration in the last several days. ?He has issues with an esophageal stricture and is status post recent dilatation.  He chokes when he eats solid food and is mostly on a soft/pur?ed diet.  She notes about a 20 pound unintentional weight loss over the last several months. He has not had any recent falls. ?He has had no fevers, no chills, no nausea, no vomiting, no changes in his bowel habits, no fever, no headache, no dizziness, no focal deficits or blurred vision. ?Review of Systems: As mentioned in the history of present illness. All other systems reviewed and are negative. ?Past Medical History:  ?Diagnosis Date  ? Anemia   ? Aortic valve sclerosis   ? Arthritis   ? Bladder outlet obstruction   ? Erectile dysfunction   ? GERD (gastroesophageal reflux disease)   ? h/o  ? Hard of hearing   ? HBP (high blood pressure)   ? Heart murmur   ? History of kidney stones   ? History of nephrolithiasis    ? HLD (hyperlipidemia)   ? Incomplete bladder emptying   ? Mitral regurgitation   ? Neuropathy   ? Nocturia   ? Osteoporosis   ? Pre-diabetes   ? Prostate cancer ([place])   ? Shingles 07/08/2012  ? Sinus problem   ? Stomach ulcer   ? Stroke ([place]) 07/08/2010  ? Urinary frequency   ? Urinary leakage   ? ?Past Surgical History:  ?Procedure Laterality Date  ? CATARACT EXTRACTION  2000, 2001  ? CHOLECYSTECTOMY  07/08/1970  ? CYSTOSCOPY/URETEROSCOPY/HOLMIUM LASER/STENT PLACEMENT Left 03/23/2021  ? Procedure: CYSTOSCOPY/URETEROSCOPY/HOLMIUM LASER/STENT PLACEMENT;  Surgeon: Billey Co, MD;  Location: [facility] ORS;  Service: Urology;  Laterality: Left;  ? ESOPHAGOGASTRODUODENOSCOPY (EGD) WITH PROPOFOL N/A 09/13/2021  ? Procedure: ESOPHAGOGASTRODUODENOSCOPY (EGD) WITH PROPOFOL;  Surgeon: [name], MD;  Location: [facility];  Service: Gastroenterology;  Laterality: N/A;  ? PROSTATE SURGERY  2008/2009  ? cryotherapy  ? TONSILLECTOMY    ? UMBILICAL HERNIA REPAIR    ? ?Social History:  reports that he quit smoking about 59 years ago. His smoking use included cigarettes. He has never used smokeless tobacco. He reports current alcohol use of about 1.0 - 2.0 standard drink per week. He reports that he does not use drugs. ? ?Allergies  ?Allergen Reactions  ? Toviaz [Fesoterodine Fumarate Er] Other (See Comments)  ?  Acid reflux  ? ? ?Family History  ?Problem Relation Age of Onset  ? Cancer Mother   ?  Stroke Father   ? Stroke Sister   ? Prostate cancer Brother   ? Kidney disease Neg Hx   ? Kidney cancer Neg Hx   ? Bladder Cancer Neg Hx   ? ? ?Prior to Admission medications   ?Medication Sig Start Date End Date Taking? Authorizing Provider  ?amLODipine (NORVASC) 5 MG tablet Take 1 tablet by mouth once daily 06/28/21  Yes Bacigalupo, Dionne Bucy, MD  ?aspirin EC 81 MG tablet Take 81 mg by mouth daily. Swallow whole.   Yes [provider]  ?busPIRone (BUSPAR) 10 MG tablet Take 1 tablet (10 mg total) by mouth 2 (two) times  daily. 07/26/21  Yes Bacigalupo, Dionne Bucy, MD  ?fluticasone (FLONASE) 50 MCG/ACT nasal spray Place 1 spray into both nostrils daily. 11/27/20  Yes Bacigalupo, Dionne Bucy, MD  ?furosemide (LASIX) 10 MG/ML solution Take 6 mLs (60 mg total) by mouth daily. 10/08/21  Yes Bacigalupo, Dionne Bucy, MD  ?furosemide (LASIX) 40 MG tablet Take 40 mg by mouth daily. 10/05/21  Yes [provider]  ?naproxen sodium (ALEVE) 220 MG tablet Take 1 tablet (220 mg total) by mouth daily as needed (pain/headache). 08/05/21   Victorino Dike, FNP  ?Polyethyl Glycol-Propyl Glycol (SYSTANE OP) Place 1 drop into both eyes daily as needed (dry eye).    [provider]  ? ? ?Physical Exam: ?Vitals:  ? 10/23/21 0510 10/23/21 0600 10/23/21 0630 10/23/21 0930  ?BP: (!) 112/93 129/76 (!) 141/88 124/81  ?Pulse: (!) 107 (!) 107 (!) 106 88  ?Resp: '17 18 17 20  '$ ?Temp:      ?TempSrc:      ?SpO2: 97% 96% 97% 94%  ?Weight:      ?Height:      ? ?Physical Exam ?Vitals and nursing note reviewed.  ?Constitutional:   ?   Appearance: Normal appearance.  ?   Comments: Oriented only to person  ?HENT:  ?   Head: Normocephalic and atraumatic.  ?   Nose: Nose normal.  ?   Mouth/Throat:  ?   Mouth: Mucous membranes are moist.  ?Eyes:  ?   Conjunctiva/sclera: Conjunctivae normal.  ?Cardiovascular:  ?   Rate and Rhythm: Tachycardia present.  ?Pulmonary:  ?   Effort: Pulmonary effort is normal.  ?   Breath sounds: Normal breath sounds.  ?Abdominal:  ?   General: Abdomen is flat.  ?   Palpations: Abdomen is soft.  ?Musculoskeletal:     ?   General: Normal range of motion.  ?   Cervical back: Normal range of motion and neck supple.  ?Skin: ?   General: Skin is warm and dry.  ?Neurological:  ?   Mental Status: He is alert.  ?   Comments: Oriented only to person not to place and time  ?Psychiatric:     ?   Mood and Affect: Mood normal.     ?   Behavior: Behavior normal.  ? ? ?Data Reviewed: ?Relevant notes from primary care and specialist visits, past discharge  summaries as available in EHR, including Care Everywhere. ?Prior diagnostic testing as pertinent to current admission diagnoses ?Updated medications and problem lists for reconciliation ?ED course, including vitals, labs, imaging, treatment and response to treatment ?Triage notes, nursing and pharmacy notes and ED provider's notes ?Notable results as noted in HPI ?Labs reviewed.  Sodium 139, potassium 4.4, chloride 101, bicarb 29, glucose 155, BUN 52, creatinine 0.9, calcium 10.6, white count 7.5, hemoglobin 13.5, hematocrit 43.4 ?Urine analysis shows pyuria ?Chest  x-ray reviewed by me shows no evidence of acute cardiopulmonary disease ?CT scan of the head without contrast showed no acute intracranial abnormality.  Stable moderate senescent change.  Remote right cerebellar infarct. ?There are no new results to review at this time.  ? ?Assessment and Plan: ?* Urinary tract infection ?Patient presents for evaluation of worsening confusion and is found to have significant pyuria ?Prior urine culture yielded pansensitive E. Coli ?We will treat patient empirically with Rocephin 1 g IV daily ?Follow-up results of urine culture ? ?Acute metabolic encephalopathy ?Per family he has had mild cognitive deficits that have worsened over the last 2 months. ?He has pyuria today which can also account for worsening confusion ?We will treat UTI with IV Rocephin ?CT scan of the head done without contrast shows old strokes ?Expect improvement in patient's mental status following treatment of acute infection ? ?Dysphagia ?Patient has a history of dysphagia and is status post upper endoscopy which showed a benign esophageal stricture that was dilated. ?Symptoms have continued despite dilatation.   ?Patient had a swallow study which showed esophageal dysmotility as well as a small hiatal hernia. ?Continue pur?ed diet ?We will consult speech therapy for further recommendation during this hospitalization ?Administer all meds crushed with  applesauce ? ?Depression ?Continue buspirone ? ?Benign essential HTN ?Blood pressure is stable ?Continue amlodipine ? ? ? ? ? Advance Care Planning:   Code Status: DNR  ? ?Consults: Speech therapy ? ?Family C

## 2021-10-23 NOTE — ED Provider Notes (Addendum)
[facility] Provider Note    Event Date/Time   First MD Initiated Contact with Patient 10/23/21 0430     (approximate)   History   Altered Mental Status   HPI  [name] is a 88 y.o. male with history of hypertension, hyperlipidemia, CVA who presents emergency department with his wife for concerns for progressively worsening confusion, generalized weakness, low appetite, difficulty swallowing for the past few weeks that significantly worsened over the past couple of days.  No fevers, vomiting, diarrhea.  He has had a dry cough.  No chest pain or shortness of breath.  He denies any pain.  No known falls.  He states that he has lost 20 pounds in the past few weeks.  Wife reports that he is not able to swallow solids and has only been able to tolerate liquids.   History provided by patient and wife.    Past Medical History:  Diagnosis Date   Anemia    Aortic valve sclerosis    Arthritis    Bladder outlet obstruction    Erectile dysfunction    GERD (gastroesophageal reflux disease)    h/o   Hard of hearing    HBP (high blood pressure)    Heart murmur    History of kidney stones    History of nephrolithiasis    HLD (hyperlipidemia)    Incomplete bladder emptying    Mitral regurgitation    Neuropathy    Nocturia    Osteoporosis    Pre-diabetes    Prostate cancer ([facility])    Shingles 07/08/2012   Sinus problem    Stomach ulcer    Stroke ([facility]) 07/08/2010   Urinary frequency    Urinary leakage     Past Surgical History:  Procedure Laterality Date   CATARACT EXTRACTION  2000, 2001   CHOLECYSTECTOMY  07/08/1970   CYSTOSCOPY/URETEROSCOPY/HOLMIUM LASER/STENT PLACEMENT Left 03/23/2021   Procedure: CYSTOSCOPY/URETEROSCOPY/HOLMIUM LASER/STENT PLACEMENT;  Surgeon: Sondra Come, MD;  Location: [facility] ORS;  Service: Urology;  Laterality: Left;   ESOPHAGOGASTRODUODENOSCOPY (EGD) WITH PROPOFOL N/A 09/13/2021   Procedure: ESOPHAGOGASTRODUODENOSCOPY  (EGD) WITH PROPOFOL;  Surgeon: Wyline Mood, MD;  Location: [facility];  Service: Gastroenterology;  Laterality: N/A;   PROSTATE SURGERY  2008/2009   cryotherapy   TONSILLECTOMY     UMBILICAL HERNIA REPAIR      MEDICATIONS:  Prior to Admission medications   Medication Sig Start Date End Date Taking? Authorizing Provider  amLODipine (NORVASC) 5 MG tablet Take 1 tablet by mouth once daily 06/28/21   Erasmo Downer, MD  aspirin EC 81 MG tablet Take 81 mg by mouth daily. Swallow whole.    [provider]  busPIRone (BUSPAR) 10 MG tablet Take 1 tablet (10 mg total) by mouth 2 (two) times daily. 07/26/21   Erasmo Downer, MD  fluticasone (FLONASE) 50 MCG/ACT nasal spray Place 1 spray into both nostrils daily. 11/27/20   Bacigalupo, Marzella Schlein, MD  furosemide (LASIX) 10 MG/ML solution Take 6 mLs (60 mg total) by mouth daily. 10/08/21   Erasmo Downer, MD  naproxen sodium (ALEVE) 220 MG tablet Take 1 tablet (220 mg total) by mouth daily as needed (pain/headache). 08/05/21   Triplett, Rulon Eisenmenger B, FNP  Polyethyl Glycol-Propyl Glycol (SYSTANE OP) Place 1 drop into both eyes daily as needed (dry eye).    [provider]    Physical Exam   Triage Vital Signs: ED Triage Vitals  Enc Vitals Group     BP  10/22/21 2211 120/76     Pulse Rate 10/22/21 2211 86     Resp 10/22/21 2211 20     Temp 10/22/21 2211 97.7 F (36.5 C)     Temp Source 10/22/21 2211 Oral     SpO2 10/22/21 2211 91 %     Weight 10/22/21 2213 147 lb (66.7 kg)     Height 10/22/21 2213 5\' 10"  (1.778 m)     Head Circumference --      Peak Flow --      Pain Score 10/22/21 2213 0     Pain Loc --      Pain Edu? --      Excl. in GC? --     Most recent vital signs: Vitals:   10/22/21 2211 10/23/21 0203  BP: 120/76 (!) 156/101  Pulse: 86 68  Resp: 20 20  Temp: 97.7 F (36.5 C) 98 F (36.7 C)  SpO2: 91% 95%    CONSTITUTIONAL: Alert and oriented to person but not place and time.  Elderly.  In no  distress.  Hard of hearing. HEAD: Normocephalic, atraumatic EYES: Conjunctivae clear, pupils appear equal, sclera nonicteric ENT: normal nose; moist mucous membranes NECK: Supple, normal ROM CARD: Irregularly irregular and rate controlled; S1 and S2 appreciated; no murmurs, no clicks, no rubs, no gallops RESP: Normal chest excursion without splinting or tachypnea; breath sounds clear and equal bilaterally; no wheezes, no rhonchi, no rales, no hypoxia or respiratory distress, speaking full sentences ABD/GI: Normal bowel sounds; non-distended; soft, non-tender, no rebound, no guarding, no peritoneal signs BACK: The back appears normal EXT: Normal ROM in all joints; no deformity noted, no edema; no cyanosis SKIN: Normal color for age and race; warm; no rash on exposed skin NEURO: Moves all extremities equally, normal speech, no facial asymmetry, reports normal sensation diffusely PSYCH: The patient's mood and manner are appropriate.   ED Results / Procedures / Treatments   LABS: (all labs ordered are listed, but only abnormal results are displayed) Labs Reviewed  COMPREHENSIVE METABOLIC PANEL - Abnormal; Notable for the following components:      Result Value   Glucose, Bld 155 (*)    BUN 52 (*)    Calcium 10.6 (*)    All other components within normal limits  URINALYSIS, ROUTINE W REFLEX MICROSCOPIC - Abnormal; Notable for the following components:   Color, Urine YELLOW (*)    APPearance HAZY (*)    Nitrite POSITIVE (*)    Leukocytes,Ua LARGE (*)    WBC, UA >50 (*)    Bacteria, UA MANY (*)    All other components within normal limits  CBG MONITORING, ED - Abnormal; Notable for the following components:   Glucose-Capillary 126 (*)    All other components within normal limits  TROPONIN I (HIGH SENSITIVITY) - Abnormal; Notable for the following components:   Troponin I (High Sensitivity) 38 (*)    All other components within normal limits  URINE CULTURE  RESP PANEL BY RT-PCR (FLU  A&B, COVID) ARPGX2  CBC     EKG:  EKG Interpretation  Date/Time:  Monday October 22 2021 22:20:00 EDT Ventricular Rate:  94 PR Interval:    QRS Duration: 108 QT Interval:  336 QTC Calculation: 420 R Axis:   -47 Text Interpretation: Atrial fibrillation Left anterior fascicular block Left ventricular hypertrophy with repolarization abnormality ( R in aVL , Cornell product ) Abnormal ECG When compared with ECG of 05-Oct-2020 09:35, PREVIOUS ECG IS PRESENT Confirmed by Danon Lograsso, Baxter Hire [phone])  on 10/23/2021 4:41:12 AM         RADIOLOGY: My personal review and interpretation of imaging: Chest x-ray clear.  CT head shows no acute abnormality.  I have personally reviewed all radiology reports.   DG Chest 2 View  Result Date: 10/22/2021 CLINICAL DATA:  Cough. EXAM: CHEST - 2 VIEW COMPARISON:  Chest CT dated 09/19/2021. FINDINGS: No focal consolidation, pleural effusion, or pneumothorax. The cardiac silhouette is within normal limits. Atherosclerotic calcification of the aorta no acute osseous pathology. Osteopenia with degenerative changes of the spine. IMPRESSION: No active cardiopulmonary disease. Electronically Signed   By: Elgie Collard M.D.   On: 10/22/2021 23:15   CT HEAD WO CONTRAST ( )  Result Date: 10/23/2021 CLINICAL DATA:  Mental status change, unknown cause EXAM: CT HEAD WITHOUT CONTRAST TECHNIQUE: Contiguous axial images were obtained from the base of the skull through the vertex without intravenous contrast. RADIATION DOSE REDUCTION: This exam was performed according to the departmental dose-optimization program which includes automated exposure control, adjustment of the mA and/or kV according to patient size and/or use of iterative reconstruction technique. COMPARISON:  09/19/2021 FINDINGS: Brain: Normal anatomic configuration. Moderate parenchymal volume loss is commensurate with the patient's age. Moderate periventricular white matter changes are present likely reflecting the  sequela of small vessel ischemia. Remote lacunar infarct noted within the right basal ganglia, unchanged. No abnormal intra or extra-axial mass lesion or fluid collection. No abnormal mass effect or midline shift. No evidence of acute intracranial hemorrhage or infarct. Borderline ventriculomegaly is commensurate with the degree of parenchymal volume loss and likely represents central atrophy. Cerebellum unremarkable. Vascular: No asymmetric hyperdense vasculature at the skull base. Skull: Intact Sinuses/Orbits: Small retention cyst or polyp within the right maxillary sinus. Remaining paranasal sinuses are clear. Ocular lenses have been removed. Orbits are otherwise unremarkable. Other: Mastoid air cells and middle ear cavities are clear. IMPRESSION: No acute intracranial abnormality. Stable moderate senescent change. Remote right cerebellar infarct. Electronically Signed   By: Helyn Numbers M.D.   On: 10/23/2021 00:40     PROCEDURES:  Critical Care performed: No     .1-3 Lead EKG Interpretation Performed by: Jacobey Gura, Layla Maw, DO Authorized by: Fusako Tanabe, Layla Maw, DO     Interpretation: abnormal     ECG rate:  65   ECG rate assessment: normal     Rhythm: atrial fibrillation     Ectopy: none     Conduction: normal      IMPRESSION / MDM / ASSESSMENT AND PLAN / ED COURSE  I reviewed the triage vital signs and the nursing notes.    Patient here with altered mental status, decreased oral intake, generalized weakness.  The patient is on the cardiac monitor to evaluate for evidence of arrhythmia and/or significant heart rate changes.   DIFFERENTIAL DIAGNOSIS (includes but not limited to):   CVA, intracranial hemorrhage, dementia, anemia, electrolyte derangement, ACS, UTI, dehydration pneumonia, COVID   PLAN: We will obtain CBC, BMP, troponin, urinalysis, CT head, chest x-ray, EKG, COVID and flu swabs   MEDICATIONS GIVEN IN ED: Medications  cefTRIAXone (ROCEPHIN) 1 g in sodium chloride  0.9 % 100 mL IVPB (has no administration in time range)  0.9 %  sodium chloride infusion (has no administration in time range)     ED COURSE: Patient's labs show no leukocytosis.  Normal hemoglobin.  Troponin minimally elevated which appears to be his baseline and is stable.  Normal electrolytes, glucose.  Urine is positive for nitrites, leukocyte esterase, red blood  cells and greater than 50,000 white blood cells and many bacteria with few squamous cells.  We will add on a urine culture.  His previous urine cultures have grown pansensitive E. coli.  We will give Rocephin.  CT head reviewed by myself and radiologist and shows no acute intracranial abnormality.  Chest x-ray reviewed by myself and radiologist and shows no pneumonia.  COVID and flu swabs pending.  Will discuss with medicine for admission for altered mental status, generalized weakness in the setting of UTI.  Patient getting IV fluids here.   CONSULTS:  Consulted and discussed patient's case with hospitalist, Dr. Para March.  I have recommended admission and consulting physician agrees and will place admission orders.  Patient (and family if present) agree with this plan.   I reviewed all nursing notes, vitals, pertinent previous records.  All labs, EKGs, imaging ordered have been independently reviewed and interpreted by myself.    OUTSIDE RECORDS REVIEWED: Reviewed patient's last family medicine note with Dr. Shirlee Latch on 10/08/2021.  It appears he is being followed for dysphagia and has had an EGD with esophageal stretching.  Previous swallow study showed esophageal dysmotility and Schatzki's ring.  He has been on a liquid diet.  Patient's wife is requesting a neurology consult.         FINAL CLINICAL IMPRESSION(S) / ED DIAGNOSES   Final diagnoses:  Acute UTI  Dysphagia, unspecified type  Altered mental status, unspecified altered mental status type  Generalized weakness     Rx / DC Orders   ED Discharge Orders      None        Note:  This document was prepared using Dragon voice recognition software and may include unintentional dictation errors.   Sherrice Creekmore, Layla Maw, DO 10/23/21 0445    Tianni Escamilla, Layla Maw, DO 10/23/21 [phone]

## 2021-10-23 NOTE — Assessment & Plan Note (Signed)
Continue buspirone

## 2021-10-23 NOTE — Evaluation (Addendum)
Clinical/Bedside Swallow Evaluation Patient Details  Name: [name] MRN: [id number] Date of Birth: 05/10/1925  Today's Date: 10/23/2021 Time: SLP Start Time (ACUTE ONLY): 1010 SLP Stop Time (ACUTE ONLY): 1055 SLP Time Calculation (min) (ACUTE ONLY): 45 min  Past Medical History:  Past Medical History:  Diagnosis Date   Anemia    Aortic valve sclerosis    Arthritis    Bladder outlet obstruction    Erectile dysfunction    GERD (gastroesophageal reflux disease)    h/o   Hard of hearing    HBP (high blood pressure)    Heart murmur    History of kidney stones    History of nephrolithiasis    HLD (hyperlipidemia)    Incomplete bladder emptying    Mitral regurgitation    Neuropathy    Nocturia    Osteoporosis    Pre-diabetes    Prostate cancer (HCC)    Shingles 07/08/2012   Sinus problem    Stomach ulcer    Stroke (HCC) 07/08/2010   Urinary frequency    Urinary leakage    Past Surgical History:  Past Surgical History:  Procedure Laterality Date   CATARACT EXTRACTION  2000, 2001   CHOLECYSTECTOMY  07/08/1970   CYSTOSCOPY/URETEROSCOPY/HOLMIUM LASER/STENT PLACEMENT Left 03/23/2021   Procedure: CYSTOSCOPY/URETEROSCOPY/HOLMIUM LASER/STENT PLACEMENT;  Surgeon: Sondra Come, MD;  Location: [facility] ORS;  Service: Urology;  Laterality: Left;   ESOPHAGOGASTRODUODENOSCOPY (EGD) WITH PROPOFOL N/A 09/13/2021   Procedure: ESOPHAGOGASTRODUODENOSCOPY (EGD) WITH PROPOFOL;  Surgeon: Wyline Mood, MD;  Location: [facility] ENDOSCOPY;  Service: Gastroenterology;  Laterality: N/A;   PROSTATE SURGERY  2008/2009   cryotherapy   TONSILLECTOMY     UMBILICAL HERNIA REPAIR     HPI:  Pt  is a 88 y.o. male with medical history significant for Esophageal phase dysphagia status post recent dilatation (09/13/2021) of esophageal stricture w/ noted MOD Hiatal Hernia, GERD, HOH, hypertension, stroke, who was brought into the ER by private vehicle for evaluation of increased weakness, worsening confusion and  depressed mood.  Patient's wife states that he has had episodes of confusion in the past but has worsened over the last 2 months and he has shown progressive decline in his cognitive abilities.  He has also become increasingly weak and incontinent.  She also notes increased irritability and frustration in the last several days.  He has issues with an Esophageal stricture and MOD Hiatal Hernia and is status post recent Dilatation on 09/13/2021.  He endorses chokes when he eats solid food and is mostly on a soft/pured diet post his recent procedure.  Wife notes about a 20 pound unintentional weight loss over the last several months.      CXR: No active cardiopulmonary disease.   MRI: no acute findings report; Moderate chronic small vessel ischemic changes within the cerebral  white matter, progressed from the prior brain MRI of 11/20/2010.  Minimal chronic small-vessel ischemic changes also present within  the pons. Known chronic infarcts within the right middle cerebellar peduncle and right cerebellar hemisphere.     Assessment / Plan / Recommendation  Clinical Impression  Pt seen for BSE today. He was anxious sitting in bed in ED w/ confusion in some responses noted resulting in difficulty w/ thought completion; there is BASELINE COGNITIVE DECLINE per report in chart; ANY COGNITIVE DECLINE can impact oral intake, swallowing, and meeting nutrition/hydration needs. Supervision w/ meals is recommended at Baseline.  NOTED RECENT EGD W/ ESOPHAGEAL DYSMOTILITY DESCRIBED; see report 09/13/2021. Pt has had recent weight loss  per Wife's report. ANY Esophageal phase dysmotility can impact oral intake, swallowing, and meeting nutrition/hydration needs.  Pt appears to present w/ adequate oropharyngeal phase swallow function w/ No oropharyngeal phase dysphagia noted, No neuromuscular deficits noted. He does have mild Cognitive decline described in chart notes (per Wife) which has increased in recent months. This can  impact overall awareness/timing of swallow and safety during po tasks which increases risk for aspiration, choking.  ALSO: pt has a baseline of GERD and Esophageal phase Dysmotility per EGD on 09/13/2021; he is on a PPI. EGD completed w/ Dilation of the Esophagus; a MOD Hiatal Hernia noted. ANY Dysmotility or Regurgitation of Reflux material can increase risk for aspiration of the Reflux material during Retrograde backflow thus cause difficulty clearing/swallowing foods. Pt has been able to drink liquids all along but ongoing issues w/ eating foods is described in chart notes. Pt endorses a "full" feeling pointing to mid sternum area.   During po trials, pt consumed consistencies of ice chips, thin liquids via Cup and purees w/ no overt coughing, decline in vocal quality, or change in respiratory presentation during/post trials. O2 sats remained 98%. Oral phase appeared [facility] w/ timely bolus management and control of bolus propulsion for A-P transfer for swallowing. Oral clearing achieved w/ trial consistencies. OM Exam appeared [facility] w/ no unilateral weakness noted. Speech Clear. Pt fed self w/ setup support.   Recommend a Pureed consistency diet w/ gravies to moisten foods well; thin liquids via Cup/less straw use d/t air swallowing; general aspiration precautions. Rest Breaks during meals/oral intake to allow for Esophageal clearing. REFLUX precautions strongly recommended to lessen chance for Regurgitation. Reduce distractions during meals d/t Cognitive decline. PILLS CRUSHED IN PUREE.  As this appears Esophageal in nature, recommend pt f/u w/ GI for management and tx as indicated. MD consulted re: this and agreed.  Recommend continue a more pureed diet until cleared by GI. Recommend f/u by Dietician for a Drink Supplement to support. MD to reconsult ST services if any new needs oropharyngeal phase swallowing issues while admitted. SLP Visit Diagnosis: Dysphagia, unspecified (R13.10) (Esophageal phase  deficits; Cognitive decline baseline)    Aspiration Risk  Mild aspiration risk;Risk for inadequate nutrition/hydration -- from an Esophageal phase standpoint    Diet Recommendation   Pureed consistency diet w/ gravies to moisten foods well; thin liquids via Cup/less straw use d/t air swallowing; general aspiration precautions. Rest Breaks during meals/oral intake to allow for Esophageal clearing. REFLUX precautions strongly recommended to lessen chance for Regurgitation. Reduce distractions during meals d/t Cognitive decline.   Medication Administration: Crushed with puree    Other  Recommendations Recommended Consults: Consider GI evaluation;Consider esophageal assessment (Dietician f/u) Oral Care Recommendations: Oral care BID;Oral care before and after PO;Patient independent with oral care (setup support) Other Recommendations:  (n/a)    Recommendations for follow up therapy are one component of a multi-disciplinary discharge planning process, led by the attending physician.  Recommendations may be updated based on patient status, additional functional criteria and insurance authorization.  Follow up Recommendations No SLP follow up      Assistance Recommended at Discharge Intermittent Supervision/Assistance  Functional Status Assessment Patient has had a recent decline in their functional status and/or demonstrates limited ability to make significant improvements in function in a reasonable and predictable amount of time (re: swallowing)  Frequency and Duration  (n/a)   (n/a)       Prognosis Prognosis for Safe Diet Advancement: Fair Barriers to Reach Goals: Cognitive deficits;Time post  onset;Severity of deficits;Behavior (Esophageal phase Dysmotility baseline)      Swallow Study   General Date of Onset: 10/22/21 HPI: Pt  is a 88 y.o. male with medical history significant for Esophageal phase dysphagia status post recent dilatation (09/13/2021) of esophageal stricture w/ noted MOD  Hiatal Hernia, GERD, HOH, hypertension, stroke, who was brought into the ER by private vehicle for evaluation of increased weakness, worsening confusion and depressed mood.  Patient's wife states that he has had episodes of confusion in the past but has worsened over the last 2 months and he has shown progressive decline in his cognitive abilities.  He has also become increasingly weak and incontinent.  She also notes increased irritability and frustration in the last several days.  He has issues with an Esophageal stricture and MOD Hiatal Hernia and is status post recent Dilatation on 09/13/2021.  He endorses chokes when he eats solid food and is mostly on a soft/pured diet post his recent procedure.  Wife notes about a 20 pound unintentional weight loss over the last several months.    CXR: No active cardiopulmonary disease.   MRI: no acute findings report; Moderate chronic small vessel ischemic changes within the cerebral  white matter, progressed from the prior brain MRI of 11/20/2010.  Minimal chronic small-vessel ischemic changes also present within  the pons. Known chronic infarcts within the right middle cerebellar peduncle and right cerebellar hemisphere. Type of Study: Bedside Swallow Evaluation Previous Swallow Assessment: none Diet Prior to this Study: Thin liquids;Dysphagia 2 (chopped) Temperature Spikes Noted: No (wbc 7.5) Respiratory Status: Room air History of Recent Intubation: No Behavior/Cognition: Alert;Cooperative;Pleasant mood;Confused;Distractible;Requires cueing (Baseline Cognitive decline) Oral Cavity Assessment: Within Functional Limits Oral Care Completed by SLP: Yes Oral Cavity - Dentition: Adequate natural dentition Vision: Functional for self-feeding Self-Feeding Abilities: Able to feed self;Needs assist;Needs set up Patient Positioning: Upright in bed (needed positioning support) Baseline Vocal Quality: Normal Volitional Cough: Strong Volitional Swallow: Able to elicit     Oral/Motor/Sensory Function Overall Oral Motor/Sensory Function: Within functional limits   Ice Chips Ice chips: Within functional limits Presentation: Spoon (fed; 4 trials)   Thin Liquid Thin Liquid: Within functional limits Presentation: Cup;Self Fed;Straw (15 trials via cup)    Nectar Thick Nectar Thick Liquid: Not tested   Honey Thick Honey Thick Liquid: Not tested   Puree Puree: Within functional limits Presentation: Self Fed;Spoon (~3 ozs consumed)   Solid     Solid: Not tested Other Comments: d/t Esophageal phase deficits - recommend f/u by GI first         Jerilynn Som, MS, CCC-SLP Speech Language Pathologist Rehab Services; [facility] [phone] (ascom)  Harsha Yusko 10/23/2021,3:51 PM

## 2021-10-24 DIAGNOSIS — R1319 Other dysphagia: Secondary | ICD-10-CM | POA: Diagnosis not present

## 2021-10-24 DIAGNOSIS — N3000 Acute cystitis without hematuria: Secondary | ICD-10-CM | POA: Diagnosis not present

## 2021-10-24 DIAGNOSIS — Z7189 Other specified counseling: Secondary | ICD-10-CM

## 2021-10-24 DIAGNOSIS — Z66 Do not resuscitate: Secondary | ICD-10-CM

## 2021-10-24 DIAGNOSIS — Z515 Encounter for palliative care: Secondary | ICD-10-CM

## 2021-10-24 DIAGNOSIS — G9341 Metabolic encephalopathy: Secondary | ICD-10-CM | POA: Diagnosis not present

## 2021-10-24 LAB — BASIC METABOLIC PANEL
Anion gap: 4 — ABNORMAL LOW (ref 5–15)
BUN: 31 mg/dL — ABNORMAL HIGH (ref 8–23)
CO2: 27 mmol/L (ref 22–32)
Calcium: 9.4 mg/dL (ref 8.9–10.3)
Chloride: 109 mmol/L (ref 98–111)
Creatinine, Ser: 0.76 mg/dL (ref 0.61–1.24)
GFR, Estimated: >60 mL/min (ref >60)
Glucose, Bld: 131 mg/dL — ABNORMAL HIGH (ref 70–99)
Potassium: 3.9 mmol/L (ref 3.5–5.1)
Sodium: 140 mmol/L (ref 135–145)

## 2021-10-24 LAB — GLUCOSE, CAPILLARY
Glucose-Capillary: 119 mg/dL — ABNORMAL HIGH (ref 70–99)
Glucose-Capillary: 127 mg/dL — ABNORMAL HIGH (ref 70–99)

## 2021-10-24 LAB — CBC
HCT: 38.5 % — ABNORMAL LOW (ref 39.0–52.0)
Hemoglobin: 12 g/dL — ABNORMAL LOW (ref 13.0–17.0)
MCH: 29.5 pg (ref 26.0–34.0)
MCHC: 31.2 g/dL (ref 30.0–36.0)
MCV: 94.6 fL (ref 80.0–100.0)
Platelets: 207 10*3/uL (ref 150–400)
RBC: 4.07 MIL/uL — ABNORMAL LOW (ref 4.22–5.81)
RDW: 13.9 % (ref 11.5–15.5)
WBC: 6.9 10*3/uL (ref 4.0–10.5)
nRBC: 0 % (ref 0.0–0.2)

## 2021-10-24 MED ORDER — ENSURE ENLIVE PO LIQD
237.0000 mL | Freq: Three times a day (TID) | ORAL | Status: DC
  Administered 2021-10-24: 237 mL via ORAL

## 2021-10-24 MED ORDER — ADULT MULTIVITAMIN W/MINERALS CH
1.0000 | ORAL_TABLET | Freq: Every day | ORAL | Status: DC
  Filled 2021-10-24: qty 1

## 2021-10-24 MED ORDER — ENSURE MAX PROTEIN PO LIQD
11.0000 [oz_av] | Freq: Three times a day (TID) | ORAL | Status: DC
Start: 2021-10-25 — End: 2021-10-25
  Filled 2021-10-24: qty 330

## 2021-10-24 NOTE — Consult Note (Signed)
?Consultation Note ?Date: 10/24/2021  ? ?Patient Name: [name]  ?DOB: 22-Jul-1924  MRN: [id number]  Age / Sex: 88 y.o., male  ?PCP: [name], MD ?Referring Physician: Barb Merino, MD ? ?Reason for Consultation: Establishing goals of care ? ?HPI/Patient Profile: 88 y.o. male  with past medical history of hypertension, hyperlipidemia, CVA, GERD, dysphagia, and esophageal stricture s/p dilation admitted on 10/23/2021 with increasing confusion and weakness.  Found to have UTI and acute metabolic encephalopathy with progressive cognitive decline.  Has been evaluated by GI and SLP.  Plans for potential EGD tomorrow.  Family with concerns about patient's cognitive decline as well as his weight loss and how to move forward with his care.  PMT consulted to discuss goals of care. ? ?Clinical Assessment and Goals of Care: ?I have reviewed medical records including EPIC notes, labs and imaging, received report from RN, assessed the patient and then spoke with patient's wife [name] and later daughter Brett Fairy to discuss diagnosis prognosis, [place], EOL wishes, disposition and options. ? ?I introduced Palliative Medicine as specialized medical care for people living with serious illness. It focuses on providing relief from the symptoms and stress of a serious illness. The goal is to improve quality of life for both the patient and the family. ? ?I initially spoke with [name] but conversation was quite short as she was in the middle of attempting to arrange additional care for Mr. Shipes outside of the hospital-going to tour memory care unit at [facility]. ? ?I was later able to speak with patient's daughter Brett Fairy. ? ? We discussed patient's current illness and what it means in the larger context of patient's on-going co-morbidities.  Natural disease trajectory and expectations at EOL were discussed.  Brett Fairy is clear that the family recognizes  patient's decline and they also understand he is nearing end-of-life.  We reviewed patient's cognitive decline as well as ongoing issues with dysphagia.  We reviewed the speech therapist bedside swallow evaluation as well as GI evaluation. ? ?Brett Fairy describes the family's goals of trying to honor what the patient wants at this time is much as they are able. ? ?Brett Fairy tells me at this time  the main focus of the family is trying to establish better care for the patient -attempting to place him in [place].  Wife is touring the facility this afternoon. ? ?Discussed with Brett Fairy the importance of continued conversation with family and the medical providers regarding overall plan of care and treatment options, ensuring decisions are within the context of the patient?s values and GOCs.   ? ?Hospice and Palliative Care services outpatient were explained and offered.  Detailed discussion about the differences of hospice or palliative care.  Discussed philosophy of hospice care and discuss type of support provided.  At this time family is unsure what kind of support they will need outpatient and would like time to discuss. ? ?Daughter requesting call from Dr. Vicente Males to further discuss GI plans.  Will send request to him. ? ?Questions and concerns were addressed. The family was encouraged to call with questions or concerns. ? ?Primary Decision Maker ?HCPOA -wife reported later in the day daughter Manuela Schwartz is actually HCPOA ?  ? ?SUMMARY OF RECOMMENDATIONS   ?Initial goals of care discussion with patient's daughter Brett Fairy ?Family seems to have good understanding of illness and prognosis ?Family understands patient is likely eligible for hospice support and are considering what sort of support they will need  outpatient ?We will ask my colleague Aniceto Boss to follow-up tomorrow ? ?Code Status/Advance Care Planning: ?DNR ? ?  ? ?Primary Diagnoses: ?Present on Admission: ? Urinary tract infection ? Acute metabolic  encephalopathy ? Benign essential HTN ? ? ?I have reviewed the medical record, interviewed the patient and family, and examined the patient. The following aspects are pertinent. ? ?Past Medical History:  ?Diagnosis Date  ? Anemia   ? Aortic valve sclerosis   ? Arthritis   ? Bladder outlet obstruction   ? Erectile dysfunction   ? GERD (gastroesophageal reflux disease)   ? h/o  ? Hard of hearing   ? HBP (high blood pressure)   ? Heart murmur   ? History of kidney stones   ? History of nephrolithiasis   ? HLD (hyperlipidemia)   ? Incomplete bladder emptying   ? Mitral regurgitation   ? Neuropathy   ? Nocturia   ? Osteoporosis   ? Pre-diabetes   ? Prostate cancer ([place])   ? Shingles 07/08/2012  ? Sinus problem   ? Stomach ulcer   ? Stroke ([place]) 07/08/2010  ? Urinary frequency   ? Urinary leakage   ? ?Social History  ? ?Socioeconomic History  ? Marital status: Married  ?  Spouse name: Not on file  ? Number of children: 2  ? Years of education: Not on file  ? Highest education level: Master's degree (e.g., MA, MS, MEng, MEd, MSW, MBA)  ?Occupational History  ? Occupation: retired  ?Tobacco Use  ? Smoking status: Former  ?  Years: 10.00  ?  Types: Cigarettes  ?  Quit date: 07/08/1962  ?  Years since quitting: 59.3  ? Smokeless tobacco: Never  ? Tobacco comments:  ?  quit 1964  ?Vaping Use  ? Vaping Use: Never used  ?Substance and Sexual Activity  ? Alcohol use: Yes  ?  Alcohol/week: 1.0 - 2.0 standard drink  ?  Types: 1 Glasses of wine per week  ?  Comment: every 2-3 mths, 1 glass of wine daily    ? Drug use: No  ? Sexual activity: Not on file  ?Other Topics Concern  ? Not on file  ?Social History Narrative  ? Not on file  ? ?Social Determinants of Health  ? ?Financial Resource Strain: Low Risk   ? Difficulty of Paying Living Expenses: Not hard at all  ?Food Insecurity: No Food Insecurity  ? Worried About Charity fundraiser in the Last Year: Never true  ? Ran Out of Food in the Last Year: Never true  ?Transportation Needs:  No Transportation Needs  ? Lack of Transportation (Medical): No  ? Lack of Transportation (Non-Medical): No  ?Physical Activity: Insufficiently Active  ? Days of Exercise per Week: 4 days  ? Minutes of Exercise per Session: 30 min  ?Stress: Stress Concern Present  ? Feeling of Stress : To some extent  ?Social Connections: Moderately Isolated  ? Frequency of Communication with Friends and Family: More than three times a week  ? Frequency of Social Gatherings with Friends and Family: Once a week  ? Attends Religious Services: Never  ? Active Member of Clubs or Organizations: No  ? Attends Archivist Meetings: Never  ? Marital Status: Married  ? ?Family History  ?Problem Relation Age of Onset  ? Cancer Mother   ? Stroke Father   ? Stroke Sister   ? Prostate cancer Brother   ? Kidney disease Neg Hx   ? Kidney  cancer Neg Hx   ? Bladder Cancer Neg Hx   ? ?Scheduled Meds: ? amLODipine  5 mg Oral Daily  ? aspirin EC  81 mg Oral Daily  ? busPIRone  10 mg Oral BID  ? chlorhexidine  15 mL Mouth Rinse BID  ? enoxaparin (LOVENOX) injection  40 mg Subcutaneous Q24H  ? mouth rinse  15 mL Mouth Rinse q12n4p  ? ?Continuous Infusions: ? cefTRIAXone (ROCEPHIN)  IV 1 g (10/24/21 0622)  ? ?PRN Meds:.acetaminophen **OR** acetaminophen, fluticasone, polyvinyl alcohol ?Allergies  ?Allergen Reactions  ? Toviaz [Fesoterodine Fumarate Er] Other (See Comments)  ?  Acid reflux  ? ?Review of Systems  ?Unable to perform ROS: Dementia  ? ?Physical Exam ?Constitutional:   ?   General: He is not in acute distress. ?   Appearance: He is ill-appearing.  ?   Comments: Sleeping, does not wake to voice  ?Pulmonary:  ?   Effort: Pulmonary effort is normal.  ?Skin: ?   General: Skin is warm and dry.  ? ? ?Vital Signs: BP 139/80 (BP Location: Left Arm)   Pulse 78   Temp 98.4 ?F (36.9 ?C) (Oral)   Resp 18   Ht '5\' 10"'$  (1.778 m)   Wt 66.7 kg   SpO2 97%   BMI 21.09 kg/m?  ?Pain Scale: Faces ?  ?Pain Score: Asleep ? ? ?SpO2: SpO2: 97 % ?O2  Device:SpO2: 97 % ?O2 Flow Rate: .  ? ?IO: Intake/output summary:  ?Intake/Output Summary (Last 24 hours) at 10/24/2021 1358 ?Last data filed at 10/24/2021 1026 ?Gross per 24 hour  ?Intake 480 ml  ?Output 700 ml  ?Net -220 ml  ? ?

## 2021-10-24 NOTE — Progress Notes (Signed)
Initial Nutrition Assessment ? ?DOCUMENTATION CODES:  ? ?Not applicable ? ?INTERVENTION:  ? ?-Ensure Enlive po TID, each supplement provides 350 kcal and 20 grams of protein ?-MVI with minerals daily ?-Feeding assistance with meals ? ?NUTRITION DIAGNOSIS:  ? ?Inadequate oral intake related to lethargy/confusion as evidenced by meal completion < 50%. ? ?GOAL:  ? ?Patient will meet greater than or equal to 90% of their needs ? ?MONITOR:  ? ?PO intake, Supplement acceptance, Diet advancement, Labs, Weight trends, Skin, I & O's ? ?REASON FOR ASSESSMENT:  ? ?Malnutrition Screening Tool ?  ? ?ASSESSMENT:  ? ?88 year old from independent living facility, [facility] with history of dysphagia status post recent esophageal stricture dilatation, GERD, hypertension brought to the ER with increasing confusion, increasing weakness and depressed mood.  Progressive decline of cognition and mood for about 3 months now.  20 pound unintentional weight loss over last several months.  In the emergency room hemodynamically stable, confused, found to have UTI. ? ?Pt admitted with UTI and acute metabolic encephalopathy.  ? ?Reviewed I/O's: -220 ml x 24 hours and -120 ml since admission ? ?UOP: 700 ml x 24 hours ? ?Pt out of room at time of visit. No family present. Spoke with nurse tech, who reports pt has been agitated and has been placed at the nurse's station for further observation. Lunch tray at bedside untouched. Noted variable meal completions (PO: 0-100%).  ? ?Pt sitting in chair and did not respond to voice or touch.  ? ?Reviewed wt hx; wt has been stable over the past 3 months.  ? ?Palliative care following for goals of care discussions.  ? ?Lab Results  ?Component Value Date  ? HGBA1C 5.7 (H) 06/22/2021  ? PTA DM medications are none.  ? ?Labs reviewed: Mg: 2.4, CBGS: 107-139 (inpatient orders for glycemic control are none).   ? ?NUTRITION - FOCUSED PHYSICAL EXAM: ? ?Flowsheet Row Most Recent Value  ?Orbital Region No depletion   ?Upper Arm Region No depletion  ?Thoracic and Lumbar Region No depletion  ?Buccal Region Mild depletion  ?Temple Region Mild depletion  ?Clavicle Bone Region No depletion  ?Clavicle and Acromion Bone Region No depletion  ?Scapular Bone Region No depletion  ?Dorsal Hand Mild depletion  ?Patellar Region No depletion  ?Anterior Thigh Region No depletion  ?Posterior Calf Region No depletion  ?Edema (RD Assessment) Mild  ?Hair Reviewed  ?Eyes Reviewed  ?Mouth Reviewed  ?Skin Reviewed  ?Nails Reviewed  ? ?  ? ? ?Diet Order:   ?Diet Order   ? ?       ?  DIET - DYS 1 Room service appropriate? Yes with Assist; Fluid consistency: Thin  Diet effective now       ?  ? ?  ?  ? ?  ? ? ?EDUCATION NEEDS:  ? ?No education needs have been identified at this time ? ?Skin:  Skin Assessment: Reviewed RN Assessment ? ?Last BM:  10/23/21 ? ?Height:  ? ?Ht Readings from Last 1 Encounters:  ?10/22/21 '5\' 10"'$  (1.778 m)  ? ? ?Weight:  ? ?Wt Readings from Last 1 Encounters:  ?10/22/21 66.7 kg  ? ? ?Ideal Body Weight:  75.5 kg ? ?BMI:  Body mass index is 21.09 kg/m?. ? ?Estimated Nutritional Needs:  ? ?Kcal:  1700-1900 ? ?Protein:  85-100 grams ? ?Fluid:  > 1.7 L ? ? ? ?Loistine Chance, RD, LDN, CDCES ?Registered Dietitian II ?Certified Diabetes Care and Education Specialist ?Please refer to [facility] for RD and/or RD  on-call/weekend/after hours pager  ?

## 2021-10-24 NOTE — [place] FL2 (Addendum)
?[place] MEDICAID FL2 LEVEL OF CARE SCREENING TOOL  ?  ? ?IDENTIFICATION  ?Patient Name: ?[name] Birthdate: [birth year] Sex: male Admission Date (Current Location): ?10/23/2021  ?[place] and [place] Number: ? [place] ?  Facility and Address:  ?[facility], [address] [place],  [address] ?     Provider Number: ?[id number]  ?Attending Physician Name and Address:  ?Barb Merino, MD ? Relative Name and Phone Number:  ?  ?   ?Current Level of Care: ?Hospital Recommended Level of Care: ?Memory Care, ALF Prior Approval Number: ?  ? ?Date Approved/Denied: ?  PASRR Number: ?[id number] A ? ?Discharge Plan: ?Memory Care, ALF ?  ? ?Current Diagnoses: ?Patient Active Problem List  ? Diagnosis Date Noted  ? Urinary tract infection 10/23/2021  ? Acute metabolic encephalopathy 54/27/0623  ? Decreased activities of daily living (ADL) 10/08/2021  ? Dysphagia 09/06/2021  ? Paroxysmal atrial fibrillation ([place]) 07/26/2021  ? GAD (generalized anxiety disorder) 07/26/2021  ? Nocturia 07/26/2021  ? Age-related osteoporosis with current pathological fracture 05/21/2021  ? Gross hematuria 03/13/2021  ? Clavi 01/18/2021  ? Pre-ulcerative calluses 01/18/2021  ? Foot callus 10/06/2020  ? Peripheral neuropathy 08/22/2020  ? Mitral regurgitation and aortic stenosis 06/27/2020  ? Murmur, cardiac 06/15/2020  ? Recurrent major depressive disorder, in full remission ([place]) 06/15/2020  ? Calf swelling 06/15/2020  ? Porokeratosis 03/16/2020  ? Memory difficulties 06/17/2019  ? Senile purpura ([place]) 05/21/2018  ? Frequent falls 04/01/2017  ? Depression 04/01/2017  ? Constipation 03/31/2017  ? Eczema 02/12/2017  ? Urinary frequency 09/18/2015  ? Allergic rhinitis with postnasal drip 06/27/2015  ? H/O: CVA (cerebrovascular accident) 06/27/2015  ? DD (diverticular disease) 06/27/2015  ? Benign essential HTN 06/27/2015  ? Hypercholesterolemia 06/27/2015  ? Fungal infection of nail 06/27/2015  ? Prediabetes  03/15/2015  ? Prostate cancer ([place]) 12/18/2014  ? Arthritis, degenerative 01/12/2014  ? H/O malignant neoplasm of prostate 12/03/2012  ? Urge incontinence 11/09/2012  ? Obstruction of urinary tract 11/09/2012  ? Dermatophytosis of groin 09/29/2012  ? ED (erectile dysfunction) of organic origin 09/29/2012  ? Incomplete bladder emptying 09/29/2012  ? ? ?Orientation RESPIRATION BLADDER Height & Weight   ?  ?Time, Self ? Normal Continent Weight: 147 lb (66.7 kg) ?Height:  '5\' 10"'$  (177.8 cm)  ?BEHAVIORAL SYMPTOMS/MOOD NEUROLOGICAL BOWEL NUTRITION STATUS  ?    Continent Diet (DYS 1 diet, thin liquids)  ?AMBULATORY STATUS COMMUNICATION OF NEEDS Skin   ?Independent Verbally Normal ?  ?  ?  ?    ?     ?     ? ? ?Personal Care Assistance Level of Assistance  ?Bathing, Dressing, Feeding Bathing Assistance: Independent ?Feeding assistance: Independent ?Dressing Assistance: Independent ?   ? ?Functional Limitations Info  ?Sight, Hearing, Speech Sight Info: Adequate ?Hearing Info: Adequate ?Speech Info: Adequate  ? ? ?SPECIAL CARE FACTORS FREQUENCY  ?PT (By licensed PT), OT (By licensed OT)   ?  ?PT Frequency: 3x ?OT Frequency: 3x ?  ?  ?  ?   ? ? ?Contractures Contractures Info: Not present  ? ? ?Additional Factors Info  ?Code Status, Allergies Code Status Info: DNR ?Allergies Info: Toviaz (Fesoterodine Fumarate Er) ?  ?  ?  ?   ? ?Current Medications (10/24/2021):  This is the current hospital active medication list ?Current Facility-Administered Medications  ?Medication Dose Route Frequency Provider Last Rate Last Admin  ? acetaminophen (TYLENOL) tablet 650 mg  650 mg Oral Q6H PRN Collier Bullock, MD  650 mg at 10/23/21 2221  ? Or  ? acetaminophen (TYLENOL) suppository 650 mg  650 mg Rectal Q6H PRN Agbata, Tochukwu, MD      ? amLODipine (NORVASC) tablet 5 mg  5 mg Oral Daily Agbata, Tochukwu, MD   5 mg at 10/24/21 0906  ? aspirin EC tablet 81 mg  81 mg Oral Daily Agbata, Tochukwu, MD   81 mg at 10/24/21 0906  ? busPIRone  (BUSPAR) tablet 10 mg  10 mg Oral BID Agbata, Tochukwu, MD   10 mg at 10/24/21 0906  ? cefTRIAXone (ROCEPHIN) 1 g in sodium chloride 0.9 % 100 mL IVPB  1 g Intravenous Q24H Agbata, Tochukwu, MD 200 mL/hr at 10/24/21 0622 1 g at 10/24/21 0622  ? chlorhexidine (PERIDEX) 0.12 % solution 15 mL  15 mL Mouth Rinse BID Agbata, Tochukwu, MD   15 mL at 10/24/21 0906  ? enoxaparin (LOVENOX) injection 40 mg  40 mg Subcutaneous Q24H Agbata, Tochukwu, MD   40 mg at 10/23/21 2138  ? feeding supplement (ENSURE ENLIVE / ENSURE PLUS) liquid 237 mL  237 mL Oral TID BM Ghimire, Dante Gang, MD      ? fluticasone (FLONASE) 50 MCG/ACT nasal spray 1 spray  1 spray Each Nare Daily PRN Agbata, Tochukwu, MD      ? MEDLINE mouth rinse  15 mL Mouth Rinse q12n4p Agbata, Tochukwu, MD      ? multivitamin with minerals tablet 1 tablet  1 tablet Oral Daily Ghimire, Kuber, MD      ? polyvinyl alcohol (LIQUIFILM TEARS) 1.4 % ophthalmic solution 1 drop  1 drop Both Eyes Daily PRN Agbata, Tochukwu, MD      ? ? ? ?Discharge Medications: ?Please see discharge summary for a list of discharge medications. ? ?Relevant Imaging Results: ? ?Relevant Lab Results: ? ? ?Additional Information ?SSN:[id number] ? ?Joury Allcorn A Jerene Yeager, LCSW ? ? ? ? ?

## 2021-10-24 NOTE — Progress Notes (Signed)
?PROGRESS NOTE ? ? ? ?[name]  YBO:[id number] DOB: 02-Mar-1925 DOA: 10/23/2021 ?PCP: [name], MD  ? ? ?Brief Narrative:  ?88 year old from independent living facility, [facility] with history of dysphagia status post recent esophageal stricture dilatation, GERD, hypertension brought to the ER with increasing confusion, increasing weakness and depressed mood.  Progressive decline of cognition and mood for about 3 months now.  20 pound unintentional weight loss over last several months.  In the emergency room hemodynamically stable, confused, found to have UTI. ? ? ?Assessment & Plan: ?  ?Acute metabolic encephalopathy in a patient with cognitive dysfunction, progressively worse dementia symptoms.  Aggravated by acute UTI present on admission. ?-Blood cultures negative.  Urine culture growing E. coli.  Continue Rocephin today.  Will check for any urinary retention.   ?-All-time fall precautions.  Delirium precautions.  Patient on BuSpar that is continued.  CT head was essentially normal.  MRI brain showed bilateral 3 mm hygromas, no intervention needed.  No focal deficits. ?-With progressive dementia symptoms and debility, will benefit with palliation or hospice.  Will consult palliative care team to discuss goals of care, further management and outpatient follow-up. ? ?Dysphagia secondary to benign esophageal stricture.  Followed by GI.  Continued on pur?ed diet.  Continue to follow with GIand speech recommendations.   ? ?Essential hypertension: Blood pressure stable on amlodipine. ? ?Goal of care: ?Progressive dementia, weight loss and mood disorder.  Will benefit with palliative.  Currently DNR/DNI. ?Called updated and discussed in detail with patient's wife.  She is very worried about his behavior and how she can handle him. ? ? ?DVT prophylaxis: enoxaparin (LOVENOX) injection 40 mg Start: 10/23/21 2200 ? ? ?Code Status: DNR ?Family Communication: Wife on the phone ?Disposition Plan: Status is:  Inpatient ?Remains inpatient appropriate because: Unsafe disposition plan.  On IV antibiotics. ?  ? ? ?Consultants:  ?Neurosurgery ?Gastroenterology ? ?Procedures:  ?None ? ?Antimicrobials:  ?Rocephin 4/18-- ? ? ?Subjective: ?Patient was seen and examined.  He was walking around the nursing station with a walker, accompanied by nurse but mostly able to walk around well.   ?Patient is paranoid, he thinks this provider is coming to cause unrest in his life. ?He was not able to keep up conversation with me. ? ?Objective: ?Vitals:  ? 10/23/21 1533 10/23/21 2124 10/24/21 0354 10/24/21 0802  ?BP: 106/87 139/89 (!) 123/92 112/82  ?Pulse: 82 (!) 55 92 (!) 101  ?Resp: '18 18 20 18  '$ ?Temp: 98.6 ?F (37 ?C) 98.1 ?F (36.7 ?C) 98 ?F (36.7 ?C) 98.4 ?F (36.9 ?C)  ?TempSrc:      ?SpO2: 97% 99% 97% 91%  ?Weight:      ?Height:      ? ? ?Intake/Output Summary (Last 24 hours) at 10/24/2021 1114 ?Last data filed at 10/24/2021 1026 ?Gross per 24 hour  ?Intake 480 ml  ?Output 700 ml  ?Net -220 ml  ? ?Filed Weights  ? 10/22/21 2213  ?Weight: 66.7 kg  ? ? ?Examination: ? ?General: Walking around in the hallway.  Looks comfortable. ?Cardiovascular: S1-S2 normal.  Regular rate rhythm. ?Respiratory: Bilateral clear. ?Gastrointestinal: Soft.  Nontender. ?Ext: No edema or cyanosis. ?Neuro: Alert and oriented x1.  He is oriented to himself.  Not oriented to situation person. ?Denies any hallucinations or delusions. ?Confused.  Has paranoia.  Pressured speech.  No combativeness. ? ? ? ? ?Data Reviewed: I have personally reviewed following labs and imaging studies ? ?CBC: ?Recent Labs  ?Lab 10/22/21 ?2217  10/24/21 ?0601  ?WBC 7.5 6.9  ?HGB 13.5 12.0*  ?HCT 43.4 38.5*  ?MCV 95.6 94.6  ?PLT 247 207  ? ?Basic Metabolic Panel: ?Recent Labs  ?Lab 10/22/21 ?2217 10/24/21 ?0601  ?NA 139 140  ?K 4.4 3.9  ?CL 101 109  ?CO2 29 27  ?GLUCOSE 155* 131*  ?BUN 52* 31*  ?CREATININE 0.91 0.76  ?CALCIUM 10.6* 9.4  ? ?GFR: ?Estimated Creatinine Clearance: 53.3 mL/min (by  C-G formula based on SCr of 0.76 mg/dL). ?Liver Function Tests: ?Recent Labs  ?Lab 10/22/21 ?2217  ?AST 20  ?ALT 11  ?ALKPHOS 83  ?BILITOT 0.5  ?PROT 7.2  ?ALBUMIN 3.6  ? ?No results for input(s): LIPASE, AMYLASE in the last 168 hours. ?No results for input(s): AMMONIA in the last 168 hours. ?Coagulation Profile: ?No results for input(s): INR, PROTIME in the last 168 hours. ?Cardiac Enzymes: ?No results for input(s): CKTOTAL, CKMB, CKMBINDEX, TROPONINI in the last 168 hours. ?BNP (last 3 results) ?No results for input(s): PROBNP in the last 8760 hours. ?HbA1C: ?No results for input(s): HGBA1C in the last 72 hours. ?CBG: ?Recent Labs  ?Lab 10/22/21 ?2215 10/23/21 ?1217 10/23/21 ?1647  ?GLUCAP 126* 139* 107*  ? ?Lipid Profile: ?No results for input(s): CHOL, HDL, LDLCALC, TRIG, CHOLHDL, LDLDIRECT in the last 72 hours. ?Thyroid Function Tests: ?No results for input(s): TSH, T4TOTAL, FREET4, T3FREE, THYROIDAB in the last 72 hours. ?Anemia Panel: ?No results for input(s): VITAMINB12, FOLATE, FERRITIN, TIBC, IRON, RETICCTPCT in the last 72 hours. ?Sepsis Labs: ?No results for input(s): PROCALCITON, LATICACIDVEN in the last 168 hours. ? ?Recent Results (from the past 240 hour(s))  ?Urine Culture     Status: Abnormal (Preliminary result)  ? Collection Time: 10/23/21  2:07 AM  ? Specimen: Urine, Clean Catch  ?Result Value Ref Range Status  ? Specimen Description   Final  ?  URINE, CLEAN CATCH ?Performed at [facility], [address] [place], [place] [address] ?  ? Special Requests   Final  ?  NONE ?Performed at [facility], [address], [place], [place] [address] ?  ? Culture >=100,000 COLONIES/mL GRAM NEGATIVE RODS (A)  Final  ? Report Status PENDING  Incomplete  ?Resp Panel by RT-PCR (Flu A&B, Covid) Nasopharyngeal Swab     Status: None  ? Collection Time: 10/23/21  6:27 AM  ? Specimen: Nasopharyngeal Swab; Nasopharyngeal(NP) swabs in vial transport medium  ?Result Value Ref Range Status  ? SARS  Coronavirus 2 by RT PCR NEGATIVE NEGATIVE Final  ?  Comment: (NOTE) ?SARS-CoV-2 target nucleic acids are NOT DETECTED. ? ?The SARS-CoV-2 RNA is generally detectable in upper respiratory ?specimens during the acute phase of infection. The lowest ?concentration of SARS-CoV-2 viral copies this assay can detect is ?138 copies/mL. A negative result does not preclude SARS-Cov-2 ?infection and should not be used as the sole basis for treatment or ?other patient management decisions. A negative result may occur with  ?improper specimen collection/handling, submission of specimen other ?than nasopharyngeal swab, presence of viral mutation(s) within the ?areas targeted by this assay, and inadequate number of viral ?copies(<138 copies/mL). A negative result must be combined with ?clinical observations, patient history, and epidemiological ?information. The expected result is Negative. ? ?Fact Sheet for Patients:  ?EntrepreneurPulse.com.au ? ?Fact Sheet for Healthcare Providers:  ?IncredibleEmployment.be ? ?This test is no t yet approved or cleared by the [place] FDA and  ?has been authorized for detection and/or diagnosis of SARS-CoV-2 by ?FDA under an Emergency Use Authorization (EUA).  This EUA will remain  ?in effect (meaning this test can be used) for the duration of the ?COVID-19 declaration under Section 564(b)(1) of the Act, 21 ?U.S.C.section 360bbb-3(b)(1), unless the authorization is terminated  ?or revoked sooner.  ? ? ?  ? Influenza A by PCR NEGATIVE NEGATIVE Final  ? Influenza B by PCR NEGATIVE NEGATIVE Final  ?  Comment: (NOTE) ?The Xpert Xpress SARS-CoV-2/FLU/RSV plus assay is intended as an aid ?in the diagnosis of influenza from Nasopharyngeal swab specimens and ?should not be used as a sole basis for treatment. Nasal washings and ?aspirates are unacceptable for Xpert Xpress SARS-CoV-2/FLU/RSV ?testing. ? ?Fact Sheet for  Patients: ?EntrepreneurPulse.com.au ? ?Fact Sheet for Healthcare Providers: ?IncredibleEmployment.be ? ?This test is not yet approved or cleared by the [place] and ?has been authorized for detection and/o

## 2021-10-24 NOTE — TOC Progression Note (Addendum)
Transition of Care (TOC) - Progression Note  ? ? ?Patient Details  ?Name: [name] ?MRN: [id number] ?Date of Birth: 27-Dec-1924 ? ?Transition of Care (TOC) CM/SW Contact  ?Marina Desire A Amadou Katzenstein, LCSW ?Phone Number: ?10/24/2021, 3:19 PM ? ?Clinical Narrative:  Pt resides at [facility] (ILF). CSW confirmed with [facility] that pt will dc back but will go to the Memory Care.   ?Anticipate dc tomorrow. [facility] notified. ? ? ? ?  ?  ? ?Expected Discharge Plan and Services ?  ?  ?  ?  ?  ?                ?  ?  ?  ?  ?  ?  ?  ?  ?  ?  ? ? ?Social Determinants of Health (SDOH) Interventions ?  ? ?Readmission Risk Interventions ?   ? View : No data to display.  ?  ?  ?  ? ? ?

## 2021-10-24 NOTE — Progress Notes (Signed)
PT Cancellation Note ? ?Patient Details ?Name: [name] ?MRN: [id number] ?DOB: July 16, 1924 ? ? ?Cancelled Treatment:    Reason Eval/Treat Not Completed: Other (comment).  Chart reviewed and attempted to see pt.  Pt not in room and has been resting behind nursing station in recliner.  Nursing requesting pt to not be seen by therapy at this moment in time due to pt's behavior and him finally settling at this time.  Will re-attempt at later date/time as medically appropriate. ? ? ?Gwenlyn Saran, PT, DPT ?10/24/21, 3:31 PM ? ?

## 2021-10-24 NOTE — Consult Note (Signed)
Neurosurgery-New Consultation Evaluation ?10/24/2021 ?[name] [id number] ? ?Identifying Statement: ?[name] is a 88 y.o. male from [place] [address] with a history of dysphagia, GERD, HTN. ? ?Physician Requesting Consultation: No ref. provider found ? ?History of Present Illness: ?[name] is a 88 y.o presenting with confusion worsening over the last couple of months and decline in cognitive abilities. He was reported to have a recent increase in weakness and new incontinence. ?Unfortunately the patient is very confused this morning and is without family at bedside. He was unable to provide any additional history.  ? ?Past Medical History:  ?Past Medical History:  ?Diagnosis Date  ? Anemia   ? Aortic valve sclerosis   ? Arthritis   ? Bladder outlet obstruction   ? Erectile dysfunction   ? GERD (gastroesophageal reflux disease)   ? h/o  ? Hard of hearing   ? HBP (high blood pressure)   ? Heart murmur   ? History of kidney stones   ? History of nephrolithiasis   ? HLD (hyperlipidemia)   ? Incomplete bladder emptying   ? Mitral regurgitation   ? Neuropathy   ? Nocturia   ? Osteoporosis   ? Pre-diabetes   ? Prostate cancer ([place])   ? Shingles 07/08/2012  ? Sinus problem   ? Stomach ulcer   ? Stroke ([place]) 07/08/2010  ? Urinary frequency   ? Urinary leakage   ? ? ?Social History: ?Social History  ? ?Socioeconomic History  ? Marital status: Married  ?  Spouse name: Not on file  ? Number of children: 2  ? Years of education: Not on file  ? Highest education level: Master's degree (e.g., MA, MS, MEng, MEd, MSW, MBA)  ?Occupational History  ? Occupation: retired  ?Tobacco Use  ? Smoking status: Former  ?  Years: 10.00  ?  Types: Cigarettes  ?  Quit date: 07/08/1962  ?  Years since quitting: 59.3  ? Smokeless tobacco: Never  ? Tobacco comments:  ?  quit 1964  ?Vaping Use  ? Vaping Use: Never used  ?Substance and Sexual Activity  ? Alcohol use: Yes  ?  Alcohol/week: 1.0 - 2.0 standard drink  ?  Types:  1 Glasses of wine per week  ?  Comment: every 2-3 mths, 1 glass of wine daily    ? Drug use: No  ? Sexual activity: Not on file  ?Other Topics Concern  ? Not on file  ?Social History Narrative  ? Not on file  ? ?Social Determinants of Health  ? ?Financial Resource Strain: Low Risk   ? Difficulty of Paying Living Expenses: Not hard at all  ?Food Insecurity: No Food Insecurity  ? Worried About Charity fundraiser in the Last Year: Never true  ? Ran Out of Food in the Last Year: Never true  ?Transportation Needs: No Transportation Needs  ? Lack of Transportation (Medical): No  ? Lack of Transportation (Non-Medical): No  ?Physical Activity: Insufficiently Active  ? Days of Exercise per Week: 4 days  ? Minutes of Exercise per Session: 30 min  ?Stress: Stress Concern Present  ? Feeling of Stress : To some extent  ?Social Connections: Moderately Isolated  ? Frequency of Communication with Friends and Family: More than three times a week  ? Frequency of Social Gatherings with Friends and Family: Once a week  ? Attends Religious Services: Never  ? Active Member of Clubs or Organizations: No  ? Attends Archivist Meetings:  Never  ? Marital Status: Married  ?Intimate Partner Violence: Not At Risk  ? Fear of Current or Ex-Partner: No  ? Emotionally Abused: No  ? Physically Abused: No  ? Sexually Abused: No  ? ? ?Family History: ?Family History  ?Problem Relation Age of Onset  ? Cancer Mother   ? Stroke Father   ? Stroke Sister   ? Prostate cancer Brother   ? Kidney disease Neg Hx   ? Kidney cancer Neg Hx   ? Bladder Cancer Neg Hx   ? ? ?Review of Systems: ? ?Pt unable to participate in ROS ? ?Physical Exam: ?BP (!) 123/92 (BP Location: Right Arm)   Pulse 92   Temp 98 ?F (36.7 ?C)   Resp 20   Ht '5\' 10"'$  (1.778 m)   Wt 66.7 kg   SpO2 97%   BMI 21.09 kg/m?  Body mass index is 21.09 kg/m?[name] Body surface area is 1.82 meters squared. ?General appearance: drowsy but arouses to voice.  ?Head: Normocephalic,  atraumatic ?Eyes: Normal, EOM intact ?Oropharynx: Moist without lesions ?Neck: Supple, no tenderness ?Heart: Normal, regular rate and rhythm, without murmur ?Lungs: Clear to auscultation, good air exchange ?Abdomen: Soft, nondistended ?Ext: No edema in LE bilaterally, good distal pulses ? ?Neurologic exam:  ?Mental status: disoriented x 3. States he is in [place], his name is [name] and that he is working when asked name and location. Unable to provide month or year ?Speech: fluent and clear ?Cranial nerves:  ?CN II-XII grossly intact  ?Motor: MAEW and on command. Cannot participate in strength testing ?Sensory: intact to light touch in all extremities ?Reflexes: 2+ and symmetric bilaterally for arms and legs ?Coordination: unable to test ?Gait: untested  ? ?Laboratory: ?Results for orders placed or performed during the hospital encounter of 10/23/21  ?Urine Culture  ? Specimen: Urine, Clean Catch  ?Result Value Ref Range  ? Specimen Description    ?  URINE, CLEAN CATCH ?Performed at [facility], [address], [place], [place] [address] ?  ? Special Requests    ?  NONE ?Performed at [facility], [address], [place], [place] [address] ?  ? Culture >=100,000 COLONIES/mL GRAM NEGATIVE RODS (A)   ? Report Status PENDING   ?Resp Panel by RT-PCR (Flu A&B, Covid) Nasopharyngeal Swab  ? Specimen: Nasopharyngeal Swab; Nasopharyngeal(NP) swabs in vial transport medium  ?Result Value Ref Range  ? SARS Coronavirus 2 by RT PCR NEGATIVE NEGATIVE  ? Influenza A by PCR NEGATIVE NEGATIVE  ? Influenza B by PCR NEGATIVE NEGATIVE  ?Comprehensive metabolic panel  ?Result Value Ref Range  ? Sodium 139 135 - 145 mmol/L  ? Potassium 4.4 3.5 - 5.1 mmol/L  ? Chloride 101 98 - 111 mmol/L  ? CO2 29 22 - 32 mmol/L  ? Glucose, Bld 155 (H) 70 - 99 mg/dL  ? BUN 52 (H) 8 - 23 mg/dL  ? Creatinine, Ser 0.91 0.61 - 1.24 mg/dL  ? Calcium 10.6 (H) 8.9 - 10.3 mg/dL  ? Total Protein 7.2 6.5 - 8.1 g/dL  ? Albumin 3.6 3.5 - 5.0  g/dL  ? AST 20 15 - 41 U/L  ? ALT 11 0 - 44 U/L  ? Alkaline Phosphatase 83 38 - 126 U/L  ? Total Bilirubin 0.5 0.3 - 1.2 mg/dL  ? GFR, Estimated >60 >60 mL/min  ? Anion gap 9 5 - 15  ?CBC  ?Result Value Ref Range  ? WBC 7.5 4.0 - 10.5 K/uL  ?  RBC 4.54 4.22 - 5.81 MIL/uL  ? Hemoglobin 13.5 13.0 - 17.0 g/dL  ? HCT 43.4 39.0 - 52.0 %  ? MCV 95.6 80.0 - 100.0 fL  ? MCH 29.7 26.0 - 34.0 pg  ? MCHC 31.1 30.0 - 36.0 g/dL  ? RDW 14.0 11.5 - 15.5 %  ? Platelets 247 150 - 400 K/uL  ? nRBC 0.0 0.0 - 0.2 %  ?Urinalysis, Routine w reflex microscopic  ?Result Value Ref Range  ? Color, Urine YELLOW (A) YELLOW  ? APPearance HAZY (A) CLEAR  ? Specific Gravity, Urine 1.017 1.005 - 1.030  ? pH 7.0 5.0 - 8.0  ? Glucose, UA NEGATIVE NEGATIVE mg/dL  ? Hgb urine dipstick NEGATIVE NEGATIVE  ? Bilirubin Urine NEGATIVE NEGATIVE  ? Ketones, ur NEGATIVE NEGATIVE mg/dL  ? Protein, ur NEGATIVE NEGATIVE mg/dL  ? Nitrite POSITIVE (A) NEGATIVE  ? Leukocytes,Ua LARGE (A) NEGATIVE  ? RBC / HPF 11-20 0 - 5 RBC/hpf  ? WBC, UA >50 (H) 0 - 5 WBC/hpf  ? Bacteria, UA MANY (A) NONE SEEN  ? Squamous Epithelial / LPF 0-5 0 - 5  ? Mucus PRESENT   ? Ca Oxalate Crys, UA PRESENT   ?Glucose, capillary  ?Result Value Ref Range  ? Glucose-Capillary 139 (H) 70 - 99 mg/dL  ?Glucose, capillary  ?Result Value Ref Range  ? Glucose-Capillary 107 (H) 70 - 99 mg/dL  ?Basic metabolic panel  ?Result Value Ref Range  ? Sodium 140 135 - 145 mmol/L  ? Potassium 3.9 3.5 - 5.1 mmol/L  ? Chloride 109 98 - 111 mmol/L  ? CO2 27 22 - 32 mmol/L  ? Glucose, Bld 131 (H) 70 - 99 mg/dL  ? BUN 31 (H) 8 - 23 mg/dL  ? Creatinine, Ser 0.76 0.61 - 1.24 mg/dL  ? Calcium 9.4 8.9 - 10.3 mg/dL  ? GFR, Estimated >60 >60 mL/min  ? Anion gap 4 (L) 5 - 15  ?CBC  ?Result Value Ref Range  ? WBC 6.9 4.0 - 10.5 K/uL  ? RBC 4.07 (L) 4.22 - 5.81 MIL/uL  ? Hemoglobin 12.0 (L) 13.0 - 17.0 g/dL  ? HCT 38.5 (L) 39.0 - 52.0 %  ? MCV 94.6 80.0 - 100.0 fL  ? MCH 29.5 26.0 - 34.0 pg  ? MCHC 31.2 30.0 - 36.0 g/dL  ?  RDW 13.9 11.5 - 15.5 %  ? Platelets 207 150 - 400 K/uL  ? nRBC 0.0 0.0 - 0.2 %  ?CBG monitoring, ED  ?Result Value Ref Range  ? Glucose-Capillary 126 (H) 70 - 99 mg/dL  ?Troponin I (High Sensitivity)  ?Result Value Ref Ra

## 2021-10-25 DIAGNOSIS — G9341 Metabolic encephalopathy: Secondary | ICD-10-CM | POA: Diagnosis not present

## 2021-10-25 DIAGNOSIS — N3000 Acute cystitis without hematuria: Secondary | ICD-10-CM | POA: Diagnosis not present

## 2021-10-25 LAB — URINE CULTURE: Culture: >=100000 — AB

## 2021-10-25 MED ORDER — CEFDINIR 300 MG PO CAPS: 300.0000 mg | ORAL_CAPSULE | Freq: Two times a day (BID) | ORAL | 0 refills | Status: DC

## 2021-10-25 MED ORDER — CEFDINIR 300 MG PO CAPS: 300.0000 mg | ORAL_CAPSULE | Freq: Two times a day (BID) | ORAL | 0 refills | Status: AC

## 2021-10-25 MED ORDER — SODIUM CHLORIDE 0.9 % IV SOLN: INTRAVENOUS | Status: DC | PRN

## 2021-10-25 MED ORDER — KETOROLAC TROMETHAMINE 15 MG/ML IJ SOLN
15.0000 mg | Freq: Once | INTRAMUSCULAR | Status: AC
  Administered 2021-10-25: 15 mg via INTRAVENOUS
  Filled 2021-10-25: qty 1

## 2021-10-25 MED ORDER — LIDOCAINE 5 % EX PTCH
1.0000 | MEDICATED_PATCH | CUTANEOUS | Status: DC
  Administered 2021-10-25: 1 via TRANSDERMAL
  Filled 2021-10-25: qty 1

## 2021-10-25 MED ORDER — TRAZODONE HCL 50 MG PO TABS
50.0000 mg | ORAL_TABLET | Freq: Once | ORAL | Status: AC
  Administered 2021-10-25: 50 mg via ORAL
  Filled 2021-10-25: qty 1

## 2021-10-25 NOTE — Discharge Summary (Signed)
Physician Discharge Summary  ?[name] ZOX:[id number] DOB: 1924/09/18 DOA: 10/23/2021 ? ?PCP: [name], MD ? ?Admit date: 10/23/2021 ?Discharge date: 10/25/2021 ? ?Admitted From: Independent living ?Disposition: Memory care unit, [facility] ? ?Recommendations for Outpatient Follow-up:  ?Follow up with PCP in 1-2 weeks ?Consult palliative care, referral sent ? ?Home Health: PT/OT ?Equipment/Devices: Walker ? ?Discharge Condition: Stable ?CODE STATUS: DNR ?Diet recommendation: Regular diet, dysphagia 1.  Aspiration precautions.  Thin liquid.  Fine chopped food and soft food. ? ?Discharge summary: ? ?Brief Narrative:  ?88 year old from independent living facility, [facility] with history of dysphagia status post recent esophageal stricture dilatation, GERD, hypertension brought to the ER with increasing confusion, increasing weakness and depressed mood.  Progressive decline of cognition and mood for about 3 months now.  20 pound unintentional weight loss over last several months.  In the emergency room hemodynamically stable, confused, found to have UTI.  He was admitted to the hospital and treated for following conditions.  ?  ?Assessment & Plan: ?  ?Acute metabolic encephalopathy in a patient with cognitive dysfunction, progressively worse dementia symptoms.  Aggravated by acute UTI present on admission. ?-Blood cultures negative.  Urine culture growing E. coli.  Received 3 days of IV Rocephin.  Will change to oral cefdinir for 7 more days.  We will treat as complicated UTI for total 10 days.  No evidence of urinary retention.    ?-All-time fall precautions.  Delirium precautions.  Patient on BuSpar that is continued.  CT head was essentially normal.  MRI brain showed bilateral 3 mm hygromas, no intervention needed.  No focal deficits. ?-With progressive dementia symptoms and debility, will benefit with palliation or hospice.  Seen by palliative care team in the hospital.  He will benefit with ongoing  palliative follow-up at the memory care unit. ?  ?Dysphagia secondary to benign esophageal stricture.  Followed by GI.  Continued on dysphagia 1 diet.  Encourage nutrition.  Supplements. ?  ?Essential hypertension: Blood pressure stable on amlodipine. ?  ?Goal of care: ?Progressive dementia, weight loss and mood disorder.  Will benefit with palliative.  Currently DNR/DNI. ?Not needing any medications now. ?Patient is transitioning to memory care unit from his independent living facility at [facility].  Stable for discharge. ? ?Discussed and updated patient's family, wife and daughter.  Memory care unit is best place for him to pick taken care along with ongoing geriatric and palliative support. ? ? ? ?Discharge Diagnoses:  ?Principal Problem: ?  Urinary tract infection ?Active Problems: ?  Acute metabolic encephalopathy ?  Benign essential HTN ?  Depression ?  Dysphagia ? ? ? ?Discharge Instructions ? ?Discharge Instructions   ? ? Amb Referral to Palliative Care   Complete by: As directed ?  ? Diet general   Complete by: As directed ?  ? Dysphagia 1 diet, fine chopped meat and gravy, thin liquids ok  ? Increase activity slowly   Complete by: As directed ?  ? ?  ? ?Allergies as of 10/25/2021   ? ?   Reactions  ? Toviaz [fesoterodine Fumarate Er] Other (See Comments)  ? Acid reflux  ? ?  ? ?  ?Medication List  ?  ? ?TAKE these medications   ? ?amLODipine 5 MG tablet ?Commonly known as: NORVASC ?Take 1 tablet by mouth once daily ?  ?aspirin EC 81 MG tablet ?Take 81 mg by mouth daily. Swallow whole. ?  ?busPIRone 10 MG tablet ?Commonly known as: BUSPAR ?Take 1  tablet (10 mg total) by mouth 2 (two) times daily. ?  ?cefdinir 300 MG capsule ?Commonly known as: OMNICEF ?Take 1 capsule (300 mg total) by mouth 2 (two) times daily for 7 days. ?Start taking on: October 26, 2021 ?  ?fluticasone 50 MCG/ACT nasal spray ?Commonly known as: Flonase ?Place 1 spray into both nostrils daily. ?  ?furosemide 40 MG tablet ?Commonly known as:  LASIX ?Take 40 mg by mouth daily. ?What changed: Another medication with the same name was removed. Continue taking this medication, and follow the directions you see here. ?  ?naproxen sodium 220 MG tablet ?Commonly known as: ALEVE ?Take 1 tablet (220 mg total) by mouth daily as needed (pain/headache). ?  ?SYSTANE OP ?Place 1 drop into both eyes daily as needed (dry eye). ?  ? ?  ? ? ?Allergies  ?Allergen Reactions  ? Toviaz [Fesoterodine Fumarate Er] Other (See Comments)  ?  Acid reflux  ? ? ?Consultations: ?Palliative care ?Gastroenterology ?Neurosurgery ? ? ?Procedures/Studies: ?DG Chest 2 View ? ?Result Date: 10/22/2021 ?CLINICAL DATA:  Cough. EXAM: CHEST - 2 VIEW COMPARISON:  Chest CT dated 09/19/2021. FINDINGS: No focal consolidation, pleural effusion, or pneumothorax. The cardiac silhouette is within normal limits. Atherosclerotic calcification of the aorta no acute osseous pathology. Osteopenia with degenerative changes of the spine. IMPRESSION: No active cardiopulmonary disease. Electronically Signed   By: Anner Crete M.D.   On: 10/22/2021 23:15  ? ?CT HEAD WO CONTRAST (5MM) ? ?Result Date: 10/23/2021 ?CLINICAL DATA:  Mental status change, unknown cause EXAM: CT HEAD WITHOUT CONTRAST TECHNIQUE: Contiguous axial images were obtained from the base of the skull through the vertex without intravenous contrast. RADIATION DOSE REDUCTION: This exam was performed according to the departmental dose-optimization program which includes automated exposure control, adjustment of the mA and/or kV according to patient size and/or use of iterative reconstruction technique. COMPARISON:  09/19/2021 FINDINGS: Brain: Normal anatomic configuration. Moderate parenchymal volume loss is commensurate with the patient's age. Moderate periventricular white matter changes are present likely reflecting the sequela of small vessel ischemia. Remote lacunar infarct noted within the right basal ganglia, unchanged. No abnormal intra or  extra-axial mass lesion or fluid collection. No abnormal mass effect or midline shift. No evidence of acute intracranial hemorrhage or infarct. Borderline ventriculomegaly is commensurate with the degree of parenchymal volume loss and likely represents central atrophy. Cerebellum unremarkable. Vascular: No asymmetric hyperdense vasculature at the skull base. Skull: Intact Sinuses/Orbits: Small retention cyst or polyp within the right maxillary sinus. Remaining paranasal sinuses are clear. Ocular lenses have been removed. Orbits are otherwise unremarkable. Other: Mastoid air cells and middle ear cavities are clear. IMPRESSION: No acute intracranial abnormality. Stable moderate senescent change. Remote right cerebellar infarct. Electronically Signed   By: Fidela Salisbury M.D.   On: 10/23/2021 00:40  ? ?MR BRAIN WO CONTRAST ? ?Addendum Date: 10/23/2021   ?ADDENDUM REPORT: 10/23/2021 16:59 ADDENDUM: Impression #1 called by telephone at the time of interpretation on 10/23/2021 at 4:15 pm to provider TOCHUKWU AGBATA , who verbally acknowledged these results. Electronically Signed   By: Kellie Simmering D.O.   On: 10/23/2021 16:59  ? ?Result Date: 10/23/2021 ?CLINICAL DATA:  Provided history: Neuro deficit, acute, stroke suspected. Additional history provided: Increased weakness, worsening confusion, depressed mood. EXAM: MRI HEAD WITHOUT CONTRAST TECHNIQUE: Multiplanar, multiecho pulse sequences of the brain and surrounding structures were obtained without intravenous contrast. COMPARISON:  Prior head CT examinations 10/23/2021 and earlier. MRI brain and MRA head 11/20/2010. FINDINGS: Brain: Mild-to-moderate cerebral  atrophy without appreciable lobar predominance. Comparatively mild cerebellar atrophy. Thin T2 FLAIR hyperintense subdural collection overlying the right cerebral hemisphere with corresponding SWI signal loss, measuring up to 3 mm in greatest thickness. In retrospect, and intermediate to low-density subdural  collection was present at this site on the prior head CT examinations of 10/23/2021 and 09/19/2021. A trace subdural collection with similar signal characteristics is also present overlying the posterior le

## 2021-10-25 NOTE — TOC Transition Note (Signed)
Transition of Care (TOC) - CM/SW Discharge Note ? ? ?Patient Details  ?Name: [name] ?MRN: [id number] ?Date of Birth: 1925-05-07 ? ?Transition of Care (TOC) CM/SW Contact:  ?Maida Widger A Corrina Steffensen, LCSW ?Phone Number: ?10/25/2021, 11:02 AM ? ? ?Clinical Narrative:   Clinical Social Worker facilitated patient discharge including contacting patient family and facility to confirm patient discharge plans.  Clinical information faxed to facility and family agreeable with plan.  CSW arranged ambulance transport via [facility] to [facility] .  RN to call [phone] for report prior to discharge. ? ? ? ? ? ?Final next level of care: Memory Care ?Barriers to Discharge: No Barriers Identified ? ? ?Patient Goals and CMS Choice ?  ?  ?  ? ?Discharge Placement ?  ?           ?Patient chooses bed at:  [facility]) ?Patient to be transferred to facility by: [facility] ?  ?Patient and family notified of of transfer: 10/25/21 ? ?Discharge Plan and Services ?  ?  ?           ?  ?  ?  ?  ?  ?  ?  ?  ?  ?  ? ?Social Determinants of Health (SDOH) Interventions ?  ? ? ?Readmission Risk Interventions ?   ? View : No data to display.  ?  ?  ?  ? ? ? ? ? ?

## 2021-10-25 NOTE — Progress Notes (Signed)
Pt. combative and refused vitals at this time ?

## 2021-10-25 NOTE — Clinical Social Work Note (Signed)
Occupational Therapy * Physical Therapy * Speech Therapy ?       ? ? ?DATE ___4/20/23________________ ?PATIENT NAME___Elmer Straight__________________ ?PATIENT MRN___[id number]_________________ ? ?DIAGNOSIS/DIAGNOSIS CODE ____Acute Metabolic Encephalopathy, N47.09__________________ ?DATE OF DISCHARGE: ___4/20/23___________ ? ?PRIMARY CARE PHYSICIAN _____Angela Bacigalupo_____________________ ?PCP PHONE/FAX___[phone]________________________ ? ?  ? ?Dear Provider (Name: __________________   ?Fax: ___________________________): ?  ?I certify that I have examined this patient and that occupational/physical/speech therapy is necessary on an outpatient basis.   ? ?The patient has expressed interest in completing their recommended course of therapy at your location.  Once a formal order from the patient's primary care physician has been obtained, please contact him/her to schedule an appointment for evaluation at your earliest convenience. ? ? ?[ X ]  Physical Therapy Evaluate and Treat ? ?        [  ]  Occupational Therapy Evaluate and Treat ? ?                                  [  ]  Speech Therapy Evaluate and Treat ? ? ? ? ? ? ?The patient's primary care physician (listed above) must furnish and be responsible for a formal order such that the recommended services may be furnished while under the primary physician's care, and that the plan of care will be established and reviewed every 30 days (or more often if condition necessitates).  ?MD electronic signature noted below ?

## 2021-10-25 NOTE — Progress Notes (Signed)
[facility] was called and report was given to nurse Luellen Pucker, patient has been transported to the facility by EMS, no distress when leaving the facility. ?

## 2021-10-25 NOTE — Progress Notes (Addendum)
? ?      CROSS COVER NOTE ? ?NAME: [name] ?MRN: [id number] ?DOB : 1924/08/19 ? ? ? ?Date of Service ?  10/25/2021  ?HPI/Events of Note ?  Secure chat received from nursing reporting that [name] is punching at staff. RN reports that patient has been verbally aggressive/threatening and has stated "you know the [place] [employer] the [place]" ? ?He is intermittently attempting to get out of bed as well as pulling at external urinary catheter. ? ?Today's Vitals  ? 10/24/21 0802 10/24/21 1100 10/24/21 1545 10/24/21 1933  ?BP: 112/82 139/80 (!) 129/94 129/90  ?Pulse: (!) 101 78 78 78  ?Resp: '18 18 18 16  '$ ?Temp: 98.4 ?F (36.9 ?C) 98.4 ?F (36.9 ?C) 97.7 ?F (36.5 ?C) 97.6 ?F (36.4 ?C)  ?TempSrc:  Oral Oral Oral  ?SpO2: 91% 97% 100% 98%  ?Weight:      ?Height:      ?PainSc:   0-No pain   ? ?  ?Interventions ?  Plan: ?Trazodone 50 mg PO ?Safety Sitter ? ?   ?  ? ?Neomia Glass MHA, MSN, FNP-BC ?Nurse Practitioner ?[facility] ?[place] ?Pager [phone] ? ?

## 2021-10-26 ENCOUNTER — Encounter: Payer: Self-pay | Admitting: Family Medicine

## 2021-10-26 ENCOUNTER — Telehealth: Payer: Self-pay | Admitting: Family Medicine

## 2021-10-26 DIAGNOSIS — I10 Essential (primary) hypertension: Secondary | ICD-10-CM | POA: Diagnosis not present

## 2021-10-26 DIAGNOSIS — K222 Esophageal obstruction: Secondary | ICD-10-CM | POA: Diagnosis not present

## 2021-10-26 DIAGNOSIS — R1310 Dysphagia, unspecified: Secondary | ICD-10-CM | POA: Diagnosis not present

## 2021-10-26 DIAGNOSIS — Z87440 Personal history of urinary (tract) infections: Secondary | ICD-10-CM | POA: Diagnosis not present

## 2021-10-26 DIAGNOSIS — I872 Venous insufficiency (chronic) (peripheral): Secondary | ICD-10-CM | POA: Diagnosis not present

## 2021-10-26 DIAGNOSIS — J302 Other seasonal allergic rhinitis: Secondary | ICD-10-CM | POA: Diagnosis not present

## 2021-10-26 DIAGNOSIS — F0153 Vascular dementia, unspecified severity, with mood disturbance: Secondary | ICD-10-CM | POA: Diagnosis not present

## 2021-10-26 DIAGNOSIS — N390 Urinary tract infection, site not specified: Secondary | ICD-10-CM | POA: Diagnosis not present

## 2021-10-26 DIAGNOSIS — F03918 Unspecified dementia, unspecified severity, with other behavioral disturbance: Secondary | ICD-10-CM | POA: Diagnosis not present

## 2021-10-26 DIAGNOSIS — K219 Gastro-esophageal reflux disease without esophagitis: Secondary | ICD-10-CM | POA: Diagnosis not present

## 2021-10-26 DIAGNOSIS — E43 Unspecified severe protein-calorie malnutrition: Secondary | ICD-10-CM | POA: Diagnosis not present

## 2021-10-26 DIAGNOSIS — I672 Cerebral atherosclerosis: Secondary | ICD-10-CM | POA: Diagnosis not present

## 2021-10-26 DIAGNOSIS — Z681 Body mass index (BMI) 19 or less, adult: Secondary | ICD-10-CM | POA: Diagnosis not present

## 2021-10-26 DIAGNOSIS — F39 Unspecified mood [affective] disorder: Secondary | ICD-10-CM | POA: Diagnosis not present

## 2021-10-26 DIAGNOSIS — H04129 Dry eye syndrome of unspecified lacrimal gland: Secondary | ICD-10-CM | POA: Diagnosis not present

## 2021-10-26 DIAGNOSIS — R634 Abnormal weight loss: Secondary | ICD-10-CM | POA: Diagnosis not present

## 2021-10-26 DIAGNOSIS — G9340 Encephalopathy, unspecified: Secondary | ICD-10-CM | POA: Diagnosis not present

## 2021-10-26 DIAGNOSIS — Z8673 Personal history of transient ischemic attack (TIA), and cerebral infarction without residual deficits: Secondary | ICD-10-CM | POA: Diagnosis not present

## 2021-10-26 DIAGNOSIS — R4182 Altered mental status, unspecified: Secondary | ICD-10-CM | POA: Diagnosis not present

## 2021-10-26 NOTE — Telephone Encounter (Signed)
Dr. B is out until 11-05-2021. Needs to schedule follow up with her when she gets back, or with another provider next week if they can't wait until she returns.  ?

## 2021-10-26 NOTE — Telephone Encounter (Signed)
Daughter Mickel Baas calling to discuss pt's care when he was in the [facility] hospital on 4/18-4/20  ?Mickel Baas states there no communication w/ them.  Also would like to discuss the lab reports that appeared abnormal and no dr notes. ?No information about MRI, no dr notes there either. ? ?She is really concerned and would like Dr B to take a look at everything today if possible. ?

## 2021-10-29 DIAGNOSIS — K222 Esophageal obstruction: Secondary | ICD-10-CM | POA: Diagnosis not present

## 2021-10-29 DIAGNOSIS — R1319 Other dysphagia: Secondary | ICD-10-CM | POA: Diagnosis not present

## 2021-10-29 DIAGNOSIS — F0150 Vascular dementia without behavioral disturbance: Secondary | ICD-10-CM | POA: Diagnosis not present

## 2021-10-29 DIAGNOSIS — E440 Moderate protein-calorie malnutrition: Secondary | ICD-10-CM | POA: Diagnosis not present

## 2021-10-29 DIAGNOSIS — I10 Essential (primary) hypertension: Secondary | ICD-10-CM | POA: Diagnosis not present

## 2021-10-29 DIAGNOSIS — N390 Urinary tract infection, site not specified: Secondary | ICD-10-CM | POA: Diagnosis not present

## 2021-10-29 DIAGNOSIS — F39 Unspecified mood [affective] disorder: Secondary | ICD-10-CM | POA: Diagnosis not present

## 2021-10-29 DIAGNOSIS — R634 Abnormal weight loss: Secondary | ICD-10-CM | POA: Diagnosis not present

## 2021-10-29 DIAGNOSIS — G9340 Encephalopathy, unspecified: Secondary | ICD-10-CM | POA: Diagnosis not present

## 2021-10-29 DIAGNOSIS — I672 Cerebral atherosclerosis: Secondary | ICD-10-CM | POA: Diagnosis not present

## 2021-10-29 DIAGNOSIS — E43 Unspecified severe protein-calorie malnutrition: Secondary | ICD-10-CM | POA: Diagnosis not present

## 2021-10-29 DIAGNOSIS — F0153 Vascular dementia, unspecified severity, with mood disturbance: Secondary | ICD-10-CM | POA: Diagnosis not present

## 2021-10-29 DIAGNOSIS — K219 Gastro-esophageal reflux disease without esophagitis: Secondary | ICD-10-CM | POA: Diagnosis not present

## 2021-10-29 NOTE — Telephone Encounter (Signed)
Mychart message sent for an appt to be scheduled.  ?

## 2021-10-30 DIAGNOSIS — E43 Unspecified severe protein-calorie malnutrition: Secondary | ICD-10-CM | POA: Diagnosis not present

## 2021-10-30 DIAGNOSIS — I672 Cerebral atherosclerosis: Secondary | ICD-10-CM | POA: Diagnosis not present

## 2021-10-30 DIAGNOSIS — K222 Esophageal obstruction: Secondary | ICD-10-CM | POA: Diagnosis not present

## 2021-10-30 DIAGNOSIS — F0153 Vascular dementia, unspecified severity, with mood disturbance: Secondary | ICD-10-CM | POA: Diagnosis not present

## 2021-10-30 DIAGNOSIS — R634 Abnormal weight loss: Secondary | ICD-10-CM | POA: Diagnosis not present

## 2021-10-30 DIAGNOSIS — G9340 Encephalopathy, unspecified: Secondary | ICD-10-CM | POA: Diagnosis not present

## 2021-10-31 DIAGNOSIS — G9340 Encephalopathy, unspecified: Secondary | ICD-10-CM | POA: Diagnosis not present

## 2021-10-31 DIAGNOSIS — K222 Esophageal obstruction: Secondary | ICD-10-CM | POA: Diagnosis not present

## 2021-10-31 DIAGNOSIS — I672 Cerebral atherosclerosis: Secondary | ICD-10-CM | POA: Diagnosis not present

## 2021-10-31 DIAGNOSIS — R634 Abnormal weight loss: Secondary | ICD-10-CM | POA: Diagnosis not present

## 2021-10-31 DIAGNOSIS — E43 Unspecified severe protein-calorie malnutrition: Secondary | ICD-10-CM | POA: Diagnosis not present

## 2021-10-31 DIAGNOSIS — F0153 Vascular dementia, unspecified severity, with mood disturbance: Secondary | ICD-10-CM | POA: Diagnosis not present

## 2021-10-31 NOTE — Telephone Encounter (Signed)
The only acute abnormality is the urine infection on the culture, which is what the cefdinir was prescribed for.. Everything else is chronic. They need to set up a hospital follow up with Dr. B next week.  ?

## 2021-11-01 ENCOUNTER — Inpatient Hospital Stay: Admission: RE | Admit: 2021-11-01 | Payer: Federal, State, Local not specified - PPO | Source: Ambulatory Visit

## 2021-11-01 DIAGNOSIS — R634 Abnormal weight loss: Secondary | ICD-10-CM | POA: Diagnosis not present

## 2021-11-01 DIAGNOSIS — G9340 Encephalopathy, unspecified: Secondary | ICD-10-CM | POA: Diagnosis not present

## 2021-11-01 DIAGNOSIS — I672 Cerebral atherosclerosis: Secondary | ICD-10-CM | POA: Diagnosis not present

## 2021-11-01 DIAGNOSIS — F39 Unspecified mood [affective] disorder: Secondary | ICD-10-CM

## 2021-11-01 DIAGNOSIS — F0153 Vascular dementia, unspecified severity, with mood disturbance: Secondary | ICD-10-CM | POA: Diagnosis not present

## 2021-11-01 DIAGNOSIS — K222 Esophageal obstruction: Secondary | ICD-10-CM | POA: Diagnosis not present

## 2021-11-01 DIAGNOSIS — E43 Unspecified severe protein-calorie malnutrition: Secondary | ICD-10-CM | POA: Diagnosis not present

## 2021-11-01 DIAGNOSIS — F0154 Vascular dementia, unspecified severity, with anxiety: Secondary | ICD-10-CM

## 2021-11-05 DIAGNOSIS — G9340 Encephalopathy, unspecified: Secondary | ICD-10-CM | POA: Diagnosis not present

## 2021-11-05 DIAGNOSIS — Z87440 Personal history of urinary (tract) infections: Secondary | ICD-10-CM | POA: Diagnosis not present

## 2021-11-05 DIAGNOSIS — F0153 Vascular dementia, unspecified severity, with mood disturbance: Secondary | ICD-10-CM | POA: Diagnosis not present

## 2021-11-05 DIAGNOSIS — Z8673 Personal history of transient ischemic attack (TIA), and cerebral infarction without residual deficits: Secondary | ICD-10-CM | POA: Diagnosis not present

## 2021-11-05 DIAGNOSIS — H04129 Dry eye syndrome of unspecified lacrimal gland: Secondary | ICD-10-CM | POA: Diagnosis not present

## 2021-11-05 DIAGNOSIS — I10 Essential (primary) hypertension: Secondary | ICD-10-CM | POA: Diagnosis not present

## 2021-11-05 DIAGNOSIS — J302 Other seasonal allergic rhinitis: Secondary | ICD-10-CM | POA: Diagnosis not present

## 2021-11-05 DIAGNOSIS — K219 Gastro-esophageal reflux disease without esophagitis: Secondary | ICD-10-CM | POA: Diagnosis not present

## 2021-11-05 DIAGNOSIS — R634 Abnormal weight loss: Secondary | ICD-10-CM | POA: Diagnosis not present

## 2021-11-05 DIAGNOSIS — L84 Corns and callosities: Secondary | ICD-10-CM | POA: Diagnosis not present

## 2021-11-05 DIAGNOSIS — R1310 Dysphagia, unspecified: Secondary | ICD-10-CM | POA: Diagnosis not present

## 2021-11-05 DIAGNOSIS — K222 Esophageal obstruction: Secondary | ICD-10-CM | POA: Diagnosis not present

## 2021-11-05 DIAGNOSIS — E43 Unspecified severe protein-calorie malnutrition: Secondary | ICD-10-CM | POA: Diagnosis not present

## 2021-11-05 DIAGNOSIS — I672 Cerebral atherosclerosis: Secondary | ICD-10-CM | POA: Diagnosis not present

## 2021-11-05 DIAGNOSIS — Z681 Body mass index (BMI) 19 or less, adult: Secondary | ICD-10-CM | POA: Diagnosis not present

## 2021-11-05 DIAGNOSIS — B351 Tinea unguium: Secondary | ICD-10-CM | POA: Diagnosis not present

## 2021-11-06 ENCOUNTER — Encounter: Payer: Self-pay | Admitting: Podiatry

## 2021-11-06 DIAGNOSIS — I672 Cerebral atherosclerosis: Secondary | ICD-10-CM | POA: Diagnosis not present

## 2021-11-06 DIAGNOSIS — E43 Unspecified severe protein-calorie malnutrition: Secondary | ICD-10-CM | POA: Diagnosis not present

## 2021-11-06 DIAGNOSIS — K222 Esophageal obstruction: Secondary | ICD-10-CM | POA: Diagnosis not present

## 2021-11-06 DIAGNOSIS — G9340 Encephalopathy, unspecified: Secondary | ICD-10-CM | POA: Diagnosis not present

## 2021-11-06 DIAGNOSIS — R634 Abnormal weight loss: Secondary | ICD-10-CM | POA: Diagnosis not present

## 2021-11-06 DIAGNOSIS — F0153 Vascular dementia, unspecified severity, with mood disturbance: Secondary | ICD-10-CM | POA: Diagnosis not present

## 2021-11-07 DIAGNOSIS — K222 Esophageal obstruction: Secondary | ICD-10-CM | POA: Diagnosis not present

## 2021-11-07 DIAGNOSIS — R634 Abnormal weight loss: Secondary | ICD-10-CM | POA: Diagnosis not present

## 2021-11-07 DIAGNOSIS — F0153 Vascular dementia, unspecified severity, with mood disturbance: Secondary | ICD-10-CM | POA: Diagnosis not present

## 2021-11-07 DIAGNOSIS — I672 Cerebral atherosclerosis: Secondary | ICD-10-CM | POA: Diagnosis not present

## 2021-11-07 DIAGNOSIS — E43 Unspecified severe protein-calorie malnutrition: Secondary | ICD-10-CM | POA: Diagnosis not present

## 2021-11-07 DIAGNOSIS — G9340 Encephalopathy, unspecified: Secondary | ICD-10-CM | POA: Diagnosis not present

## 2021-11-08 ENCOUNTER — Ambulatory Visit (INDEPENDENT_AMBULATORY_CARE_PROVIDER_SITE_OTHER): Payer: Medicare Other | Admitting: Podiatry

## 2021-11-08 ENCOUNTER — Encounter: Payer: Self-pay | Admitting: Podiatry

## 2021-11-08 DIAGNOSIS — R634 Abnormal weight loss: Secondary | ICD-10-CM | POA: Diagnosis not present

## 2021-11-08 DIAGNOSIS — G9340 Encephalopathy, unspecified: Secondary | ICD-10-CM | POA: Diagnosis not present

## 2021-11-08 DIAGNOSIS — L84 Corns and callosities: Secondary | ICD-10-CM | POA: Diagnosis not present

## 2021-11-08 DIAGNOSIS — B351 Tinea unguium: Secondary | ICD-10-CM | POA: Diagnosis not present

## 2021-11-08 DIAGNOSIS — Q828 Other specified congenital malformations of skin: Secondary | ICD-10-CM | POA: Diagnosis not present

## 2021-11-08 DIAGNOSIS — F0153 Vascular dementia, unspecified severity, with mood disturbance: Secondary | ICD-10-CM | POA: Diagnosis not present

## 2021-11-08 DIAGNOSIS — M79676 Pain in unspecified toe(s): Secondary | ICD-10-CM

## 2021-11-08 DIAGNOSIS — G6289 Other specified polyneuropathies: Secondary | ICD-10-CM | POA: Diagnosis not present

## 2021-11-08 DIAGNOSIS — E43 Unspecified severe protein-calorie malnutrition: Secondary | ICD-10-CM | POA: Diagnosis not present

## 2021-11-08 DIAGNOSIS — I672 Cerebral atherosclerosis: Secondary | ICD-10-CM | POA: Diagnosis not present

## 2021-11-08 DIAGNOSIS — K222 Esophageal obstruction: Secondary | ICD-10-CM | POA: Diagnosis not present

## 2021-11-08 NOTE — Progress Notes (Signed)
This patient returns to my office for at risk foot care.  This patient requires this care by a professional since this patient will be at risk due to having peripheral neuropathy. He presents to the office with his wife and caregiver from [facility]. His wife implies that his health is failing.  His daughter has called and sent  pictures of the two painful spots on his feet.   This patient has developed an ulcerous callus/hematoma left forefoot.   There is no drainage from either spot/callus/corn on his feet.  He presents to the office wearing bandages. Patient has thick painful nails both feet.  This patient presents for at risk foot care today. ? ?General Appearance  Alert, conversant and in no acute stress. ? ?Vascular  Dorsalis pedis and posterior tibial  pulses are weakly  palpable  bilaterally.  Capillary return is within normal limits  bilaterally. Temperature is diminished   bilaterally. ? ?Neurologic  Senn-Weinstein monofilament wire test diminished  bilaterally. Muscle power within normal limits bilaterally. ? ?Nails Thick disfigured discolored nails with subungual debris  from hallux to fifth toes bilaterally. No evidence of bacterial infection or drainage bilaterally. ? ?Orthopedic  No limitations of motion  feet .  No crepitus or effusions noted.  No bony pathology or digital deformities noted.  HAV 1st MPJ left foot.  Hammer toe second toe left foot. Plantar flexed second metatarsal left foot. ? ?Skin  normotropic skin with no porokeratosis noted bilaterally.  No signs of infections or ulcers noted.   Hemorrhagic callus left forefoot.  No drainage or infection noted.  Painful clavi third toe right foot. ? ?Ulcerous callus left forefoot.  Clavi third toe right foot.  Onychomycosis  B/L ? ?Consent was obtained for treatment procedures. Debrided callus left forefoot with # 15 blade and dremel tool. Debride clavi 3rd toe right foot with # 15 blade followed by dremel tool  usage. Debride nails with a nail  nipper followed by dremel tool usage.   Padding dispensed to patient as well as put into his left shoe at the site of left forefoot callus. ? ? ?Return office visit    10 weeks                 Told patient to return for periodic foot care and evaluation due to potential at risk complications. ? ?Addendum.  Patient had his nails debrided first, as the third toenail  right foot was worked on,  he responded in  severe pain.  I immediately realized the clavi on the third toe was the reason for his pain.  I proceeded to debride the left forefoot callus uneventfully.  As I started to debride the clavi third toe right foot,  I noted the clavi had separated from the toe which explained his pain reaction.  I debrided the clavi with # 15 blade, nail nipper and dremel tool.  Following my treatment no pain was noted.  Padding was added to his left shoe to off weight bear his left forefoot.  I then left the room and proceeded to dictate my visit.  Christon told me the daughter was on the phone wanting to talk with me.  I told Christon that I would return her call after I finished my already seated patients in their treatment room.  The daughter next called the caregiver who came to where I was dictating and gave me the phone.  She then wanted to talk about her father.  Delydia was in  the room and overheard the whole conversation.   The daughter wanted to know what caused her pain and my treatment.  I told her about the clavi being so painful and she questioned why I did not use anesthesia.  I told her once I realized the reason for the pain I could treat her father without anesthesia.  Delydia told me I needed to see my other patients.  The daughter  next wanted to then know what could be done to prevent the painful callus.  I told her that only surgery could be performed to eliminate the painful left forefoot callus.  At that point I decided to refer her father to Dr.  Posey Pronto for surgical consult and his treatment of preulcerous  callus in 10 weeks. ? ? ?Gardiner Barefoot DPM  ? ? ?Gardiner Barefoot DPM   ?

## 2021-11-12 DIAGNOSIS — I672 Cerebral atherosclerosis: Secondary | ICD-10-CM | POA: Diagnosis not present

## 2021-11-12 DIAGNOSIS — G9340 Encephalopathy, unspecified: Secondary | ICD-10-CM | POA: Diagnosis not present

## 2021-11-12 DIAGNOSIS — K222 Esophageal obstruction: Secondary | ICD-10-CM | POA: Diagnosis not present

## 2021-11-12 DIAGNOSIS — F0153 Vascular dementia, unspecified severity, with mood disturbance: Secondary | ICD-10-CM | POA: Diagnosis not present

## 2021-11-12 DIAGNOSIS — E43 Unspecified severe protein-calorie malnutrition: Secondary | ICD-10-CM | POA: Diagnosis not present

## 2021-11-12 DIAGNOSIS — R634 Abnormal weight loss: Secondary | ICD-10-CM | POA: Diagnosis not present

## 2021-11-13 DIAGNOSIS — E43 Unspecified severe protein-calorie malnutrition: Secondary | ICD-10-CM | POA: Diagnosis not present

## 2021-11-13 DIAGNOSIS — G9340 Encephalopathy, unspecified: Secondary | ICD-10-CM | POA: Diagnosis not present

## 2021-11-13 DIAGNOSIS — F0153 Vascular dementia, unspecified severity, with mood disturbance: Secondary | ICD-10-CM | POA: Diagnosis not present

## 2021-11-13 DIAGNOSIS — K222 Esophageal obstruction: Secondary | ICD-10-CM | POA: Diagnosis not present

## 2021-11-13 DIAGNOSIS — R634 Abnormal weight loss: Secondary | ICD-10-CM | POA: Diagnosis not present

## 2021-11-13 DIAGNOSIS — I672 Cerebral atherosclerosis: Secondary | ICD-10-CM | POA: Diagnosis not present

## 2021-11-14 DIAGNOSIS — I672 Cerebral atherosclerosis: Secondary | ICD-10-CM | POA: Diagnosis not present

## 2021-11-14 DIAGNOSIS — R634 Abnormal weight loss: Secondary | ICD-10-CM | POA: Diagnosis not present

## 2021-11-14 DIAGNOSIS — K222 Esophageal obstruction: Secondary | ICD-10-CM | POA: Diagnosis not present

## 2021-11-14 DIAGNOSIS — G9340 Encephalopathy, unspecified: Secondary | ICD-10-CM | POA: Diagnosis not present

## 2021-11-14 DIAGNOSIS — E43 Unspecified severe protein-calorie malnutrition: Secondary | ICD-10-CM | POA: Diagnosis not present

## 2021-11-14 DIAGNOSIS — F0153 Vascular dementia, unspecified severity, with mood disturbance: Secondary | ICD-10-CM | POA: Diagnosis not present

## 2021-11-15 DIAGNOSIS — G9340 Encephalopathy, unspecified: Secondary | ICD-10-CM | POA: Diagnosis not present

## 2021-11-15 DIAGNOSIS — I672 Cerebral atherosclerosis: Secondary | ICD-10-CM | POA: Diagnosis not present

## 2021-11-15 DIAGNOSIS — R634 Abnormal weight loss: Secondary | ICD-10-CM | POA: Diagnosis not present

## 2021-11-15 DIAGNOSIS — F0153 Vascular dementia, unspecified severity, with mood disturbance: Secondary | ICD-10-CM | POA: Diagnosis not present

## 2021-11-15 DIAGNOSIS — K222 Esophageal obstruction: Secondary | ICD-10-CM | POA: Diagnosis not present

## 2021-11-15 DIAGNOSIS — E43 Unspecified severe protein-calorie malnutrition: Secondary | ICD-10-CM | POA: Diagnosis not present

## 2021-11-19 DIAGNOSIS — K222 Esophageal obstruction: Secondary | ICD-10-CM | POA: Diagnosis not present

## 2021-11-19 DIAGNOSIS — I672 Cerebral atherosclerosis: Secondary | ICD-10-CM | POA: Diagnosis not present

## 2021-11-19 DIAGNOSIS — F0153 Vascular dementia, unspecified severity, with mood disturbance: Secondary | ICD-10-CM | POA: Diagnosis not present

## 2021-11-19 DIAGNOSIS — F39 Unspecified mood [affective] disorder: Secondary | ICD-10-CM | POA: Diagnosis not present

## 2021-11-19 DIAGNOSIS — R634 Abnormal weight loss: Secondary | ICD-10-CM | POA: Diagnosis not present

## 2021-11-19 DIAGNOSIS — E43 Unspecified severe protein-calorie malnutrition: Secondary | ICD-10-CM | POA: Diagnosis not present

## 2021-11-19 DIAGNOSIS — G9340 Encephalopathy, unspecified: Secondary | ICD-10-CM | POA: Diagnosis not present

## 2021-11-20 DIAGNOSIS — G9340 Encephalopathy, unspecified: Secondary | ICD-10-CM | POA: Diagnosis not present

## 2021-11-20 DIAGNOSIS — I672 Cerebral atherosclerosis: Secondary | ICD-10-CM | POA: Diagnosis not present

## 2021-11-20 DIAGNOSIS — E43 Unspecified severe protein-calorie malnutrition: Secondary | ICD-10-CM | POA: Diagnosis not present

## 2021-11-20 DIAGNOSIS — K222 Esophageal obstruction: Secondary | ICD-10-CM | POA: Diagnosis not present

## 2021-11-20 DIAGNOSIS — R634 Abnormal weight loss: Secondary | ICD-10-CM | POA: Diagnosis not present

## 2021-11-20 DIAGNOSIS — F0153 Vascular dementia, unspecified severity, with mood disturbance: Secondary | ICD-10-CM | POA: Diagnosis not present

## 2021-11-21 DIAGNOSIS — F0153 Vascular dementia, unspecified severity, with mood disturbance: Secondary | ICD-10-CM | POA: Diagnosis not present

## 2021-11-21 DIAGNOSIS — K222 Esophageal obstruction: Secondary | ICD-10-CM | POA: Diagnosis not present

## 2021-11-21 DIAGNOSIS — I672 Cerebral atherosclerosis: Secondary | ICD-10-CM | POA: Diagnosis not present

## 2021-11-21 DIAGNOSIS — G9340 Encephalopathy, unspecified: Secondary | ICD-10-CM | POA: Diagnosis not present

## 2021-11-21 DIAGNOSIS — R634 Abnormal weight loss: Secondary | ICD-10-CM | POA: Diagnosis not present

## 2021-11-21 DIAGNOSIS — E43 Unspecified severe protein-calorie malnutrition: Secondary | ICD-10-CM | POA: Diagnosis not present

## 2021-11-22 ENCOUNTER — Ambulatory Visit: Payer: Medicare Other | Admitting: Family Medicine

## 2021-11-22 DIAGNOSIS — E441 Mild protein-calorie malnutrition: Secondary | ICD-10-CM | POA: Diagnosis not present

## 2021-11-22 DIAGNOSIS — F01518 Vascular dementia, unspecified severity, with other behavioral disturbance: Secondary | ICD-10-CM | POA: Diagnosis not present

## 2021-11-22 DIAGNOSIS — F39 Unspecified mood [affective] disorder: Secondary | ICD-10-CM | POA: Diagnosis not present

## 2021-11-22 DIAGNOSIS — K219 Gastro-esophageal reflux disease without esophagitis: Secondary | ICD-10-CM | POA: Diagnosis not present

## 2021-11-22 DIAGNOSIS — I10 Essential (primary) hypertension: Secondary | ICD-10-CM | POA: Diagnosis not present

## 2021-11-26 DIAGNOSIS — G9340 Encephalopathy, unspecified: Secondary | ICD-10-CM | POA: Diagnosis not present

## 2021-11-26 DIAGNOSIS — R634 Abnormal weight loss: Secondary | ICD-10-CM | POA: Diagnosis not present

## 2021-11-26 DIAGNOSIS — I672 Cerebral atherosclerosis: Secondary | ICD-10-CM | POA: Diagnosis not present

## 2021-11-26 DIAGNOSIS — F0153 Vascular dementia, unspecified severity, with mood disturbance: Secondary | ICD-10-CM | POA: Diagnosis not present

## 2021-11-26 DIAGNOSIS — E43 Unspecified severe protein-calorie malnutrition: Secondary | ICD-10-CM | POA: Diagnosis not present

## 2021-11-26 DIAGNOSIS — K222 Esophageal obstruction: Secondary | ICD-10-CM | POA: Diagnosis not present

## 2021-11-27 ENCOUNTER — Ambulatory Visit: Payer: Federal, State, Local not specified - PPO | Admitting: Gastroenterology

## 2021-11-27 DIAGNOSIS — G9340 Encephalopathy, unspecified: Secondary | ICD-10-CM | POA: Diagnosis not present

## 2021-11-27 DIAGNOSIS — I672 Cerebral atherosclerosis: Secondary | ICD-10-CM | POA: Diagnosis not present

## 2021-11-27 DIAGNOSIS — F0153 Vascular dementia, unspecified severity, with mood disturbance: Secondary | ICD-10-CM | POA: Diagnosis not present

## 2021-11-27 DIAGNOSIS — K222 Esophageal obstruction: Secondary | ICD-10-CM | POA: Diagnosis not present

## 2021-11-27 DIAGNOSIS — R634 Abnormal weight loss: Secondary | ICD-10-CM | POA: Diagnosis not present

## 2021-11-27 DIAGNOSIS — E43 Unspecified severe protein-calorie malnutrition: Secondary | ICD-10-CM | POA: Diagnosis not present

## 2021-11-28 DIAGNOSIS — I672 Cerebral atherosclerosis: Secondary | ICD-10-CM | POA: Diagnosis not present

## 2021-11-28 DIAGNOSIS — R634 Abnormal weight loss: Secondary | ICD-10-CM | POA: Diagnosis not present

## 2021-11-28 DIAGNOSIS — E43 Unspecified severe protein-calorie malnutrition: Secondary | ICD-10-CM | POA: Diagnosis not present

## 2021-11-28 DIAGNOSIS — K222 Esophageal obstruction: Secondary | ICD-10-CM | POA: Diagnosis not present

## 2021-11-28 DIAGNOSIS — F0153 Vascular dementia, unspecified severity, with mood disturbance: Secondary | ICD-10-CM | POA: Diagnosis not present

## 2021-11-28 DIAGNOSIS — G9340 Encephalopathy, unspecified: Secondary | ICD-10-CM | POA: Diagnosis not present

## 2021-11-29 DIAGNOSIS — F0153 Vascular dementia, unspecified severity, with mood disturbance: Secondary | ICD-10-CM | POA: Diagnosis not present

## 2021-11-29 DIAGNOSIS — I672 Cerebral atherosclerosis: Secondary | ICD-10-CM | POA: Diagnosis not present

## 2021-11-29 DIAGNOSIS — E43 Unspecified severe protein-calorie malnutrition: Secondary | ICD-10-CM | POA: Diagnosis not present

## 2021-11-29 DIAGNOSIS — K222 Esophageal obstruction: Secondary | ICD-10-CM | POA: Diagnosis not present

## 2021-11-29 DIAGNOSIS — G9340 Encephalopathy, unspecified: Secondary | ICD-10-CM | POA: Diagnosis not present

## 2021-11-29 DIAGNOSIS — R634 Abnormal weight loss: Secondary | ICD-10-CM | POA: Diagnosis not present

## 2021-11-30 DIAGNOSIS — I10 Essential (primary) hypertension: Secondary | ICD-10-CM | POA: Diagnosis not present

## 2021-11-30 DIAGNOSIS — I5030 Unspecified diastolic (congestive) heart failure: Secondary | ICD-10-CM | POA: Diagnosis not present

## 2021-12-04 ENCOUNTER — Telehealth: Payer: Self-pay

## 2021-12-04 DIAGNOSIS — K222 Esophageal obstruction: Secondary | ICD-10-CM | POA: Diagnosis not present

## 2021-12-04 DIAGNOSIS — F0153 Vascular dementia, unspecified severity, with mood disturbance: Secondary | ICD-10-CM | POA: Diagnosis not present

## 2021-12-04 DIAGNOSIS — J9811 Atelectasis: Secondary | ICD-10-CM | POA: Diagnosis not present

## 2021-12-04 DIAGNOSIS — Z23 Encounter for immunization: Secondary | ICD-10-CM | POA: Diagnosis not present

## 2021-12-04 DIAGNOSIS — R634 Abnormal weight loss: Secondary | ICD-10-CM | POA: Diagnosis not present

## 2021-12-04 DIAGNOSIS — E43 Unspecified severe protein-calorie malnutrition: Secondary | ICD-10-CM | POA: Diagnosis not present

## 2021-12-04 DIAGNOSIS — R0602 Shortness of breath: Secondary | ICD-10-CM | POA: Diagnosis not present

## 2021-12-04 DIAGNOSIS — G9340 Encephalopathy, unspecified: Secondary | ICD-10-CM | POA: Diagnosis not present

## 2021-12-04 DIAGNOSIS — I672 Cerebral atherosclerosis: Secondary | ICD-10-CM | POA: Diagnosis not present

## 2021-12-04 NOTE — Telephone Encounter (Signed)
I left v/m for [name] (DPR signed) to give our office a cb. Sending note to Dr Silvio Pate and Larene Beach CMA.

## 2021-12-04 NOTE — Telephone Encounter (Signed)
[facility] Night - Client Nonclinical Telephone Record  [facility] Client [facility] Night - Client Client Site [place] - Night Provider Viviana Simpler- MD Contact Type Call Who Is Calling Patient / Member / Family / Caregiver Caller Name Purple Sage Phone Number [phone] Patient Name [name] Patient DOB 01/06/1925 Call Type Message Only Information Provided Reason for Call Request to Schedule Office Appointment Initial Comment Caller states her father needs an appointment with Dr. Silvio Pate. Patient request to speak to RN No Additional Comment Caller states was informed office is closed for Holiday. She needs a call back ASAP about her Father. Disp. Time Disposition Final User 12/03/2021 3:22:36 PM General Information Provided Yes Jess Barters Call Closed By: Jess Barters Transaction Date/Time: 12/03/2021 3:16:35 PM (ET

## 2021-12-04 NOTE — Telephone Encounter (Signed)
I have contacted her though [facility]---no action needed about this

## 2021-12-06 DIAGNOSIS — I110 Hypertensive heart disease with heart failure: Secondary | ICD-10-CM | POA: Diagnosis not present

## 2021-12-06 DIAGNOSIS — L84 Corns and callosities: Secondary | ICD-10-CM | POA: Diagnosis not present

## 2021-12-06 DIAGNOSIS — G9340 Encephalopathy, unspecified: Secondary | ICD-10-CM | POA: Diagnosis not present

## 2021-12-06 DIAGNOSIS — B351 Tinea unguium: Secondary | ICD-10-CM | POA: Diagnosis not present

## 2021-12-06 DIAGNOSIS — Z87440 Personal history of urinary (tract) infections: Secondary | ICD-10-CM | POA: Diagnosis not present

## 2021-12-06 DIAGNOSIS — Z8673 Personal history of transient ischemic attack (TIA), and cerebral infarction without residual deficits: Secondary | ICD-10-CM | POA: Diagnosis not present

## 2021-12-06 DIAGNOSIS — K222 Esophageal obstruction: Secondary | ICD-10-CM | POA: Diagnosis not present

## 2021-12-06 DIAGNOSIS — I509 Heart failure, unspecified: Secondary | ICD-10-CM | POA: Diagnosis not present

## 2021-12-06 DIAGNOSIS — Z681 Body mass index (BMI) 19 or less, adult: Secondary | ICD-10-CM | POA: Diagnosis not present

## 2021-12-06 DIAGNOSIS — I672 Cerebral atherosclerosis: Secondary | ICD-10-CM | POA: Diagnosis not present

## 2021-12-06 DIAGNOSIS — I69818 Other symptoms and signs involving cognitive functions following other cerebrovascular disease: Secondary | ICD-10-CM | POA: Diagnosis not present

## 2021-12-06 DIAGNOSIS — K219 Gastro-esophageal reflux disease without esophagitis: Secondary | ICD-10-CM | POA: Diagnosis not present

## 2021-12-06 DIAGNOSIS — N401 Enlarged prostate with lower urinary tract symptoms: Secondary | ICD-10-CM | POA: Diagnosis not present

## 2021-12-06 DIAGNOSIS — J302 Other seasonal allergic rhinitis: Secondary | ICD-10-CM | POA: Diagnosis not present

## 2021-12-06 DIAGNOSIS — R339 Retention of urine, unspecified: Secondary | ICD-10-CM | POA: Diagnosis not present

## 2021-12-06 DIAGNOSIS — H04129 Dry eye syndrome of unspecified lacrimal gland: Secondary | ICD-10-CM | POA: Diagnosis not present

## 2021-12-06 DIAGNOSIS — F0153 Vascular dementia, unspecified severity, with mood disturbance: Secondary | ICD-10-CM | POA: Diagnosis not present

## 2021-12-06 DIAGNOSIS — E43 Unspecified severe protein-calorie malnutrition: Secondary | ICD-10-CM | POA: Diagnosis not present

## 2021-12-06 DIAGNOSIS — R1310 Dysphagia, unspecified: Secondary | ICD-10-CM | POA: Diagnosis not present

## 2021-12-06 DIAGNOSIS — R634 Abnormal weight loss: Secondary | ICD-10-CM | POA: Diagnosis not present

## 2021-12-07 DIAGNOSIS — F0153 Vascular dementia, unspecified severity, with mood disturbance: Secondary | ICD-10-CM | POA: Diagnosis not present

## 2021-12-07 DIAGNOSIS — K222 Esophageal obstruction: Secondary | ICD-10-CM | POA: Diagnosis not present

## 2021-12-07 DIAGNOSIS — I69818 Other symptoms and signs involving cognitive functions following other cerebrovascular disease: Secondary | ICD-10-CM | POA: Diagnosis not present

## 2021-12-07 DIAGNOSIS — R634 Abnormal weight loss: Secondary | ICD-10-CM | POA: Diagnosis not present

## 2021-12-07 DIAGNOSIS — G9340 Encephalopathy, unspecified: Secondary | ICD-10-CM | POA: Diagnosis not present

## 2021-12-07 DIAGNOSIS — I672 Cerebral atherosclerosis: Secondary | ICD-10-CM | POA: Diagnosis not present

## 2021-12-10 DIAGNOSIS — F0153 Vascular dementia, unspecified severity, with mood disturbance: Secondary | ICD-10-CM | POA: Diagnosis not present

## 2021-12-10 DIAGNOSIS — I69818 Other symptoms and signs involving cognitive functions following other cerebrovascular disease: Secondary | ICD-10-CM | POA: Diagnosis not present

## 2021-12-10 DIAGNOSIS — G9340 Encephalopathy, unspecified: Secondary | ICD-10-CM | POA: Diagnosis not present

## 2021-12-10 DIAGNOSIS — R634 Abnormal weight loss: Secondary | ICD-10-CM | POA: Diagnosis not present

## 2021-12-10 DIAGNOSIS — K222 Esophageal obstruction: Secondary | ICD-10-CM | POA: Diagnosis not present

## 2021-12-10 DIAGNOSIS — N179 Acute kidney failure, unspecified: Secondary | ICD-10-CM | POA: Diagnosis not present

## 2021-12-10 DIAGNOSIS — I672 Cerebral atherosclerosis: Secondary | ICD-10-CM | POA: Diagnosis not present

## 2021-12-11 DIAGNOSIS — K222 Esophageal obstruction: Secondary | ICD-10-CM | POA: Diagnosis not present

## 2021-12-11 DIAGNOSIS — G9340 Encephalopathy, unspecified: Secondary | ICD-10-CM | POA: Diagnosis not present

## 2021-12-11 DIAGNOSIS — F0153 Vascular dementia, unspecified severity, with mood disturbance: Secondary | ICD-10-CM | POA: Diagnosis not present

## 2021-12-11 DIAGNOSIS — I69818 Other symptoms and signs involving cognitive functions following other cerebrovascular disease: Secondary | ICD-10-CM | POA: Diagnosis not present

## 2021-12-11 DIAGNOSIS — I672 Cerebral atherosclerosis: Secondary | ICD-10-CM | POA: Diagnosis not present

## 2021-12-11 DIAGNOSIS — R634 Abnormal weight loss: Secondary | ICD-10-CM | POA: Diagnosis not present

## 2021-12-12 DIAGNOSIS — F0153 Vascular dementia, unspecified severity, with mood disturbance: Secondary | ICD-10-CM | POA: Diagnosis not present

## 2021-12-12 DIAGNOSIS — I672 Cerebral atherosclerosis: Secondary | ICD-10-CM | POA: Diagnosis not present

## 2021-12-12 DIAGNOSIS — G9340 Encephalopathy, unspecified: Secondary | ICD-10-CM | POA: Diagnosis not present

## 2021-12-12 DIAGNOSIS — I69818 Other symptoms and signs involving cognitive functions following other cerebrovascular disease: Secondary | ICD-10-CM | POA: Diagnosis not present

## 2021-12-12 DIAGNOSIS — K222 Esophageal obstruction: Secondary | ICD-10-CM | POA: Diagnosis not present

## 2021-12-12 DIAGNOSIS — R634 Abnormal weight loss: Secondary | ICD-10-CM | POA: Diagnosis not present

## 2021-12-13 DIAGNOSIS — G9340 Encephalopathy, unspecified: Secondary | ICD-10-CM | POA: Diagnosis not present

## 2021-12-13 DIAGNOSIS — R634 Abnormal weight loss: Secondary | ICD-10-CM | POA: Diagnosis not present

## 2021-12-13 DIAGNOSIS — F0153 Vascular dementia, unspecified severity, with mood disturbance: Secondary | ICD-10-CM | POA: Diagnosis not present

## 2021-12-13 DIAGNOSIS — I69818 Other symptoms and signs involving cognitive functions following other cerebrovascular disease: Secondary | ICD-10-CM | POA: Diagnosis not present

## 2021-12-13 DIAGNOSIS — I672 Cerebral atherosclerosis: Secondary | ICD-10-CM | POA: Diagnosis not present

## 2021-12-13 DIAGNOSIS — K222 Esophageal obstruction: Secondary | ICD-10-CM | POA: Diagnosis not present

## 2021-12-14 DIAGNOSIS — F0153 Vascular dementia, unspecified severity, with mood disturbance: Secondary | ICD-10-CM | POA: Diagnosis not present

## 2021-12-14 DIAGNOSIS — R634 Abnormal weight loss: Secondary | ICD-10-CM | POA: Diagnosis not present

## 2021-12-14 DIAGNOSIS — I672 Cerebral atherosclerosis: Secondary | ICD-10-CM | POA: Diagnosis not present

## 2021-12-14 DIAGNOSIS — I69818 Other symptoms and signs involving cognitive functions following other cerebrovascular disease: Secondary | ICD-10-CM | POA: Diagnosis not present

## 2021-12-14 DIAGNOSIS — K222 Esophageal obstruction: Secondary | ICD-10-CM | POA: Diagnosis not present

## 2021-12-14 DIAGNOSIS — G9340 Encephalopathy, unspecified: Secondary | ICD-10-CM | POA: Diagnosis not present

## 2022-01-05 DEATH — deceased

## 2022-02-14 ENCOUNTER — Ambulatory Visit: Payer: Medicare Other | Admitting: Podiatry

## 2022-04-05 IMAGING — CR DG PELVIS 1-2V
1 series · 1 of 1 positions shown · non-contrast
Comparison: CT 03/25/2021 and previous

CLINICAL DATA: Bilateral ischial pain post fall

EXAM:
PELVIS - 1-2 VIEW

[dg pelvis 1-2 views]
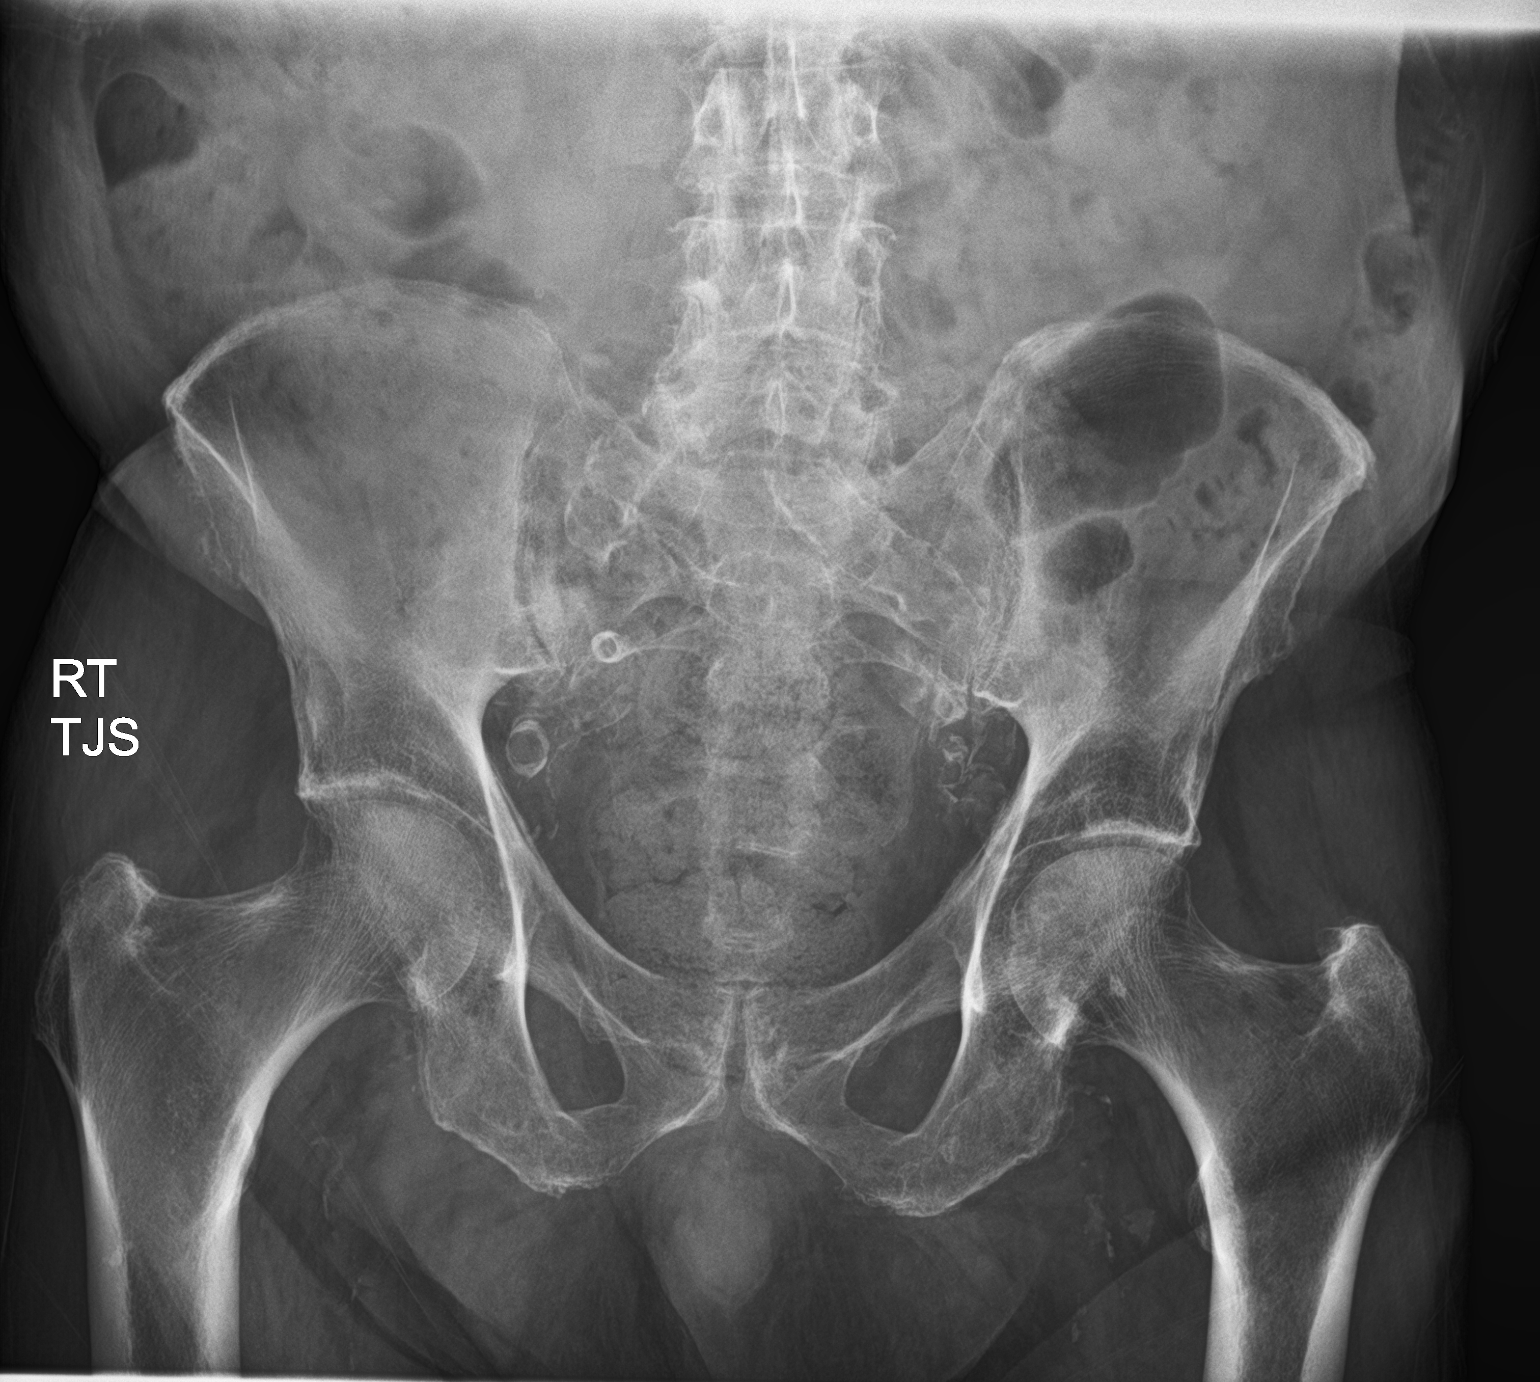

[1 of 1 positions shown; findings below may reference images not displayed]

FINDINGS: No fracture or dislocation. Normal alignment and mineralization.
Extensive iliofemoral arterial calcifications.
IMPRESSION: 1. No acute bone abnormality.
2. Extensive iliofemoral arterial calcifications.
